# Patient Record
Sex: Female | Born: 1935 | Race: White | Hispanic: No | Smoking: Never smoker
Health system: Southern US, Community
[De-identification: ages and names within clinical notes are randomized; demographics above are authoritative.]

## PROBLEM LIST (undated history)

## (undated) DIAGNOSIS — H269 Unspecified cataract: Secondary | ICD-10-CM

## (undated) DIAGNOSIS — M1990 Unspecified osteoarthritis, unspecified site: Secondary | ICD-10-CM

## (undated) DIAGNOSIS — I10 Essential (primary) hypertension: Secondary | ICD-10-CM

## (undated) DIAGNOSIS — N390 Urinary tract infection, site not specified: Secondary | ICD-10-CM

## (undated) DIAGNOSIS — S42409A Unspecified fracture of lower end of unspecified humerus, initial encounter for closed fracture: Secondary | ICD-10-CM

## (undated) DIAGNOSIS — M810 Age-related osteoporosis without current pathological fracture: Secondary | ICD-10-CM

## (undated) DIAGNOSIS — H9190 Unspecified hearing loss, unspecified ear: Secondary | ICD-10-CM

## (undated) DIAGNOSIS — E039 Hypothyroidism, unspecified: Secondary | ICD-10-CM

## (undated) DIAGNOSIS — K859 Acute pancreatitis without necrosis or infection, unspecified: Secondary | ICD-10-CM

## (undated) DIAGNOSIS — F0390 Unspecified dementia without behavioral disturbance: Secondary | ICD-10-CM

## (undated) DIAGNOSIS — G4700 Insomnia, unspecified: Secondary | ICD-10-CM

## (undated) DIAGNOSIS — H35059 Retinal neovascularization, unspecified, unspecified eye: Secondary | ICD-10-CM

## (undated) DIAGNOSIS — IMO0002 Reserved for concepts with insufficient information to code with codable children: Secondary | ICD-10-CM

## (undated) DIAGNOSIS — H3530 Unspecified macular degeneration: Secondary | ICD-10-CM

## (undated) DIAGNOSIS — S066X9A Traumatic subarachnoid hemorrhage with loss of consciousness of unspecified duration, initial encounter: Secondary | ICD-10-CM

## (undated) HISTORY — DX: Hypothyroidism, unspecified: E03.9

## (undated) HISTORY — DX: Urinary tract infection, site not specified: N39.0

## (undated) HISTORY — DX: Unspecified hearing loss, unspecified ear: H91.90

## (undated) HISTORY — DX: Acute pancreatitis without necrosis or infection, unspecified: K85.90

## (undated) HISTORY — DX: Reserved for concepts with insufficient information to code with codable children: IMO0002

## (undated) HISTORY — DX: Insomnia, unspecified: G47.00

## (undated) HISTORY — DX: Unspecified macular degeneration: H35.30

## (undated) HISTORY — DX: Retinal neovascularization, unspecified, unspecified eye: H35.059

## (undated) HISTORY — DX: Age-related osteoporosis without current pathological fracture: M81.0

## (undated) HISTORY — DX: Unspecified osteoarthritis, unspecified site: M19.90

## (undated) HISTORY — PX: BLADDER SUSPENSION: SHX72

## (undated) HISTORY — DX: Unspecified fracture of lower end of unspecified humerus, initial encounter for closed fracture: S42.409A

---
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
## 1958-05-13 HISTORY — PX: ANKLE FRACTURE SURGERY: SHX122

## 2000-03-23 ENCOUNTER — Emergency Department (HOSPITAL_COMMUNITY): Admission: EM | Admit: 2000-03-23 | Discharge: 2000-03-24 | Payer: Self-pay | Admitting: Emergency Medicine

## 2000-03-28 ENCOUNTER — Emergency Department (HOSPITAL_COMMUNITY): Admission: EM | Admit: 2000-03-28 | Discharge: 2000-03-28 | Payer: Self-pay | Admitting: Emergency Medicine

## 2000-04-04 ENCOUNTER — Other Ambulatory Visit: Admission: RE | Admit: 2000-04-04 | Discharge: 2000-04-04 | Payer: Self-pay | Admitting: Geriatric Medicine

## 2004-04-30 ENCOUNTER — Emergency Department (HOSPITAL_COMMUNITY): Admission: EM | Admit: 2004-04-30 | Discharge: 2004-04-30 | Payer: Self-pay | Admitting: Emergency Medicine

## 2005-07-26 ENCOUNTER — Encounter: Admission: RE | Admit: 2005-07-26 | Discharge: 2005-07-26 | Payer: Self-pay | Admitting: Gastroenterology

## 2009-11-08 ENCOUNTER — Inpatient Hospital Stay (HOSPITAL_COMMUNITY): Admission: AD | Admit: 2009-11-08 | Discharge: 2009-11-10 | Payer: Self-pay | Admitting: Internal Medicine

## 2010-07-29 LAB — PHOSPHORUS
Phosphorus: 2.6 mg/dL (ref 2.3–4.6)
Phosphorus: 2.8 mg/dL (ref 2.3–4.6)

## 2010-07-29 LAB — TSH: TSH: 3.16 u[IU]/mL (ref 0.350–4.500)

## 2010-07-29 LAB — COMPREHENSIVE METABOLIC PANEL
ALT: 20 U/L (ref 0–35)
AST: 25 U/L (ref 0–37)
Albumin: 3.2 g/dL — ABNORMAL LOW (ref 3.5–5.2)
Alkaline Phosphatase: 30 U/L — ABNORMAL LOW (ref 39–117)
BUN: 24 mg/dL — ABNORMAL HIGH (ref 6–23)
CO2: 30 mEq/L (ref 19–32)
Calcium: 11 mg/dL — ABNORMAL HIGH (ref 8.4–10.5)
Chloride: 94 mEq/L — ABNORMAL LOW (ref 96–112)
Creatinine, Ser: 1.09 mg/dL (ref 0.4–1.2)
GFR calc Af Amer: 57 mL/min — ABNORMAL LOW (ref >60)
GFR calc non Af Amer: 47 mL/min — ABNORMAL LOW (ref >60)
Glucose, Bld: 106 mg/dL — ABNORMAL HIGH (ref 70–99)
Potassium: 3.4 mEq/L — ABNORMAL LOW (ref 3.5–5.1)
Sodium: 132 mEq/L — ABNORMAL LOW (ref 135–145)
Total Bilirubin: 0.9 mg/dL (ref 0.3–1.2)
Total Protein: 5.8 g/dL — ABNORMAL LOW (ref 6.0–8.3)

## 2010-07-29 LAB — CBC
HCT: 33.3 % — ABNORMAL LOW (ref 36.0–46.0)
Hemoglobin: 11.8 g/dL — ABNORMAL LOW (ref 12.0–15.0)
MCH: 34.8 pg — ABNORMAL HIGH (ref 26.0–34.0)
MCHC: 35.4 g/dL (ref 30.0–36.0)
MCV: 98.2 fL (ref 78.0–100.0)
Platelets: 185 10*3/uL (ref 150–400)
RBC: 3.39 MIL/uL — ABNORMAL LOW (ref 3.87–5.11)
RDW: 13 % (ref 11.5–15.5)
WBC: 7.5 10*3/uL (ref 4.0–10.5)

## 2010-07-29 LAB — BASIC METABOLIC PANEL
BUN: 20 mg/dL (ref 6–23)
CO2: 23 mEq/L (ref 19–32)
Calcium: 9.4 mg/dL (ref 8.4–10.5)
Chloride: 106 mEq/L (ref 96–112)
Creatinine, Ser: 0.85 mg/dL (ref 0.4–1.2)
GFR calc Af Amer: >60 mL/min (ref >60)
GFR calc non Af Amer: >60 mL/min (ref >60)
Glucose, Bld: 93 mg/dL (ref 70–99)
Potassium: 3.3 mEq/L — ABNORMAL LOW (ref 3.5–5.1)
Sodium: 139 mEq/L (ref 135–145)

## 2010-07-29 LAB — PTH, INTACT AND CALCIUM
Calcium, Total (PTH): 12.2 mg/dL — ABNORMAL HIGH (ref 8.4–10.5)
PTH: 3.5 pg/mL — ABNORMAL LOW (ref 14.0–72.0)

## 2010-07-29 LAB — MAGNESIUM
Magnesium: 2 mg/dL (ref 1.5–2.5)
Magnesium: 1.4 mg/dL — ABNORMAL LOW (ref 1.5–2.5)

## 2010-07-29 LAB — T3, FREE: T3, Free: 2.2 pg/mL — ABNORMAL LOW (ref 2.3–4.2)

## 2010-07-29 LAB — T4, FREE: Free T4: 1.04 ng/dL (ref 0.80–1.80)

## 2011-05-17 DIAGNOSIS — H353290 Exudative age-related macular degeneration, unspecified eye, stage unspecified: Secondary | ICD-10-CM | POA: Diagnosis not present

## 2011-05-17 DIAGNOSIS — H35059 Retinal neovascularization, unspecified, unspecified eye: Secondary | ICD-10-CM | POA: Diagnosis not present

## 2011-06-03 DIAGNOSIS — H26499 Other secondary cataract, unspecified eye: Secondary | ICD-10-CM | POA: Diagnosis not present

## 2011-06-17 DIAGNOSIS — H543 Unqualified visual loss, both eyes: Secondary | ICD-10-CM | POA: Diagnosis not present

## 2011-06-17 DIAGNOSIS — H3530 Unspecified macular degeneration: Secondary | ICD-10-CM | POA: Diagnosis not present

## 2011-06-17 DIAGNOSIS — Z5189 Encounter for other specified aftercare: Secondary | ICD-10-CM | POA: Diagnosis not present

## 2011-06-17 DIAGNOSIS — Z9181 History of falling: Secondary | ICD-10-CM | POA: Diagnosis not present

## 2011-06-17 DIAGNOSIS — R262 Difficulty in walking, not elsewhere classified: Secondary | ICD-10-CM | POA: Diagnosis not present

## 2011-06-17 DIAGNOSIS — M159 Polyosteoarthritis, unspecified: Secondary | ICD-10-CM | POA: Diagnosis not present

## 2011-06-17 DIAGNOSIS — R293 Abnormal posture: Secondary | ICD-10-CM | POA: Diagnosis not present

## 2011-06-18 DIAGNOSIS — H353290 Exudative age-related macular degeneration, unspecified eye, stage unspecified: Secondary | ICD-10-CM | POA: Diagnosis not present

## 2011-06-18 DIAGNOSIS — H35059 Retinal neovascularization, unspecified, unspecified eye: Secondary | ICD-10-CM | POA: Diagnosis not present

## 2011-06-20 DIAGNOSIS — M159 Polyosteoarthritis, unspecified: Secondary | ICD-10-CM | POA: Diagnosis not present

## 2011-06-20 DIAGNOSIS — R262 Difficulty in walking, not elsewhere classified: Secondary | ICD-10-CM | POA: Diagnosis not present

## 2011-06-20 DIAGNOSIS — H3530 Unspecified macular degeneration: Secondary | ICD-10-CM | POA: Diagnosis not present

## 2011-06-20 DIAGNOSIS — Z5189 Encounter for other specified aftercare: Secondary | ICD-10-CM | POA: Diagnosis not present

## 2011-06-20 DIAGNOSIS — H543 Unqualified visual loss, both eyes: Secondary | ICD-10-CM | POA: Diagnosis not present

## 2011-06-20 DIAGNOSIS — R293 Abnormal posture: Secondary | ICD-10-CM | POA: Diagnosis not present

## 2011-06-25 DIAGNOSIS — H543 Unqualified visual loss, both eyes: Secondary | ICD-10-CM | POA: Diagnosis not present

## 2011-06-25 DIAGNOSIS — R262 Difficulty in walking, not elsewhere classified: Secondary | ICD-10-CM | POA: Diagnosis not present

## 2011-06-25 DIAGNOSIS — Z5189 Encounter for other specified aftercare: Secondary | ICD-10-CM | POA: Diagnosis not present

## 2011-06-25 DIAGNOSIS — R293 Abnormal posture: Secondary | ICD-10-CM | POA: Diagnosis not present

## 2011-06-25 DIAGNOSIS — M159 Polyosteoarthritis, unspecified: Secondary | ICD-10-CM | POA: Diagnosis not present

## 2011-06-25 DIAGNOSIS — H3530 Unspecified macular degeneration: Secondary | ICD-10-CM | POA: Diagnosis not present

## 2011-06-27 DIAGNOSIS — R293 Abnormal posture: Secondary | ICD-10-CM | POA: Diagnosis not present

## 2011-06-27 DIAGNOSIS — M159 Polyosteoarthritis, unspecified: Secondary | ICD-10-CM | POA: Diagnosis not present

## 2011-06-27 DIAGNOSIS — R262 Difficulty in walking, not elsewhere classified: Secondary | ICD-10-CM | POA: Diagnosis not present

## 2011-06-27 DIAGNOSIS — H543 Unqualified visual loss, both eyes: Secondary | ICD-10-CM | POA: Diagnosis not present

## 2011-06-27 DIAGNOSIS — Z5189 Encounter for other specified aftercare: Secondary | ICD-10-CM | POA: Diagnosis not present

## 2011-06-27 DIAGNOSIS — H3530 Unspecified macular degeneration: Secondary | ICD-10-CM | POA: Diagnosis not present

## 2011-07-02 DIAGNOSIS — H3530 Unspecified macular degeneration: Secondary | ICD-10-CM | POA: Diagnosis not present

## 2011-07-02 DIAGNOSIS — H543 Unqualified visual loss, both eyes: Secondary | ICD-10-CM | POA: Diagnosis not present

## 2011-07-02 DIAGNOSIS — R262 Difficulty in walking, not elsewhere classified: Secondary | ICD-10-CM | POA: Diagnosis not present

## 2011-07-02 DIAGNOSIS — R293 Abnormal posture: Secondary | ICD-10-CM | POA: Diagnosis not present

## 2011-07-02 DIAGNOSIS — Z5189 Encounter for other specified aftercare: Secondary | ICD-10-CM | POA: Diagnosis not present

## 2011-07-02 DIAGNOSIS — M159 Polyosteoarthritis, unspecified: Secondary | ICD-10-CM | POA: Diagnosis not present

## 2011-07-04 DIAGNOSIS — H3530 Unspecified macular degeneration: Secondary | ICD-10-CM | POA: Diagnosis not present

## 2011-07-04 DIAGNOSIS — M159 Polyosteoarthritis, unspecified: Secondary | ICD-10-CM | POA: Diagnosis not present

## 2011-07-04 DIAGNOSIS — R262 Difficulty in walking, not elsewhere classified: Secondary | ICD-10-CM | POA: Diagnosis not present

## 2011-07-04 DIAGNOSIS — Z5189 Encounter for other specified aftercare: Secondary | ICD-10-CM | POA: Diagnosis not present

## 2011-07-04 DIAGNOSIS — H543 Unqualified visual loss, both eyes: Secondary | ICD-10-CM | POA: Diagnosis not present

## 2011-07-04 DIAGNOSIS — R293 Abnormal posture: Secondary | ICD-10-CM | POA: Diagnosis not present

## 2011-07-08 DIAGNOSIS — M159 Polyosteoarthritis, unspecified: Secondary | ICD-10-CM | POA: Diagnosis not present

## 2011-07-08 DIAGNOSIS — R293 Abnormal posture: Secondary | ICD-10-CM | POA: Diagnosis not present

## 2011-07-08 DIAGNOSIS — R262 Difficulty in walking, not elsewhere classified: Secondary | ICD-10-CM | POA: Diagnosis not present

## 2011-07-08 DIAGNOSIS — H3530 Unspecified macular degeneration: Secondary | ICD-10-CM | POA: Diagnosis not present

## 2011-07-08 DIAGNOSIS — H543 Unqualified visual loss, both eyes: Secondary | ICD-10-CM | POA: Diagnosis not present

## 2011-07-08 DIAGNOSIS — Z5189 Encounter for other specified aftercare: Secondary | ICD-10-CM | POA: Diagnosis not present

## 2011-07-10 DIAGNOSIS — Z5189 Encounter for other specified aftercare: Secondary | ICD-10-CM | POA: Diagnosis not present

## 2011-07-10 DIAGNOSIS — H543 Unqualified visual loss, both eyes: Secondary | ICD-10-CM | POA: Diagnosis not present

## 2011-07-10 DIAGNOSIS — H3530 Unspecified macular degeneration: Secondary | ICD-10-CM | POA: Diagnosis not present

## 2011-07-10 DIAGNOSIS — R262 Difficulty in walking, not elsewhere classified: Secondary | ICD-10-CM | POA: Diagnosis not present

## 2011-07-10 DIAGNOSIS — R293 Abnormal posture: Secondary | ICD-10-CM | POA: Diagnosis not present

## 2011-07-10 DIAGNOSIS — M159 Polyosteoarthritis, unspecified: Secondary | ICD-10-CM | POA: Diagnosis not present

## 2011-07-12 DIAGNOSIS — H3530 Unspecified macular degeneration: Secondary | ICD-10-CM | POA: Diagnosis not present

## 2011-07-12 DIAGNOSIS — R262 Difficulty in walking, not elsewhere classified: Secondary | ICD-10-CM | POA: Diagnosis not present

## 2011-07-12 DIAGNOSIS — Z5189 Encounter for other specified aftercare: Secondary | ICD-10-CM | POA: Diagnosis not present

## 2011-07-12 DIAGNOSIS — M159 Polyosteoarthritis, unspecified: Secondary | ICD-10-CM | POA: Diagnosis not present

## 2011-07-12 DIAGNOSIS — R293 Abnormal posture: Secondary | ICD-10-CM | POA: Diagnosis not present

## 2011-07-12 DIAGNOSIS — H543 Unqualified visual loss, both eyes: Secondary | ICD-10-CM | POA: Diagnosis not present

## 2011-07-15 DIAGNOSIS — H543 Unqualified visual loss, both eyes: Secondary | ICD-10-CM | POA: Diagnosis not present

## 2011-07-15 DIAGNOSIS — R293 Abnormal posture: Secondary | ICD-10-CM | POA: Diagnosis not present

## 2011-07-15 DIAGNOSIS — H3530 Unspecified macular degeneration: Secondary | ICD-10-CM | POA: Diagnosis not present

## 2011-07-15 DIAGNOSIS — R262 Difficulty in walking, not elsewhere classified: Secondary | ICD-10-CM | POA: Diagnosis not present

## 2011-07-15 DIAGNOSIS — M159 Polyosteoarthritis, unspecified: Secondary | ICD-10-CM | POA: Diagnosis not present

## 2011-07-15 DIAGNOSIS — Z5189 Encounter for other specified aftercare: Secondary | ICD-10-CM | POA: Diagnosis not present

## 2011-07-17 DIAGNOSIS — Z5189 Encounter for other specified aftercare: Secondary | ICD-10-CM | POA: Diagnosis not present

## 2011-07-17 DIAGNOSIS — M159 Polyosteoarthritis, unspecified: Secondary | ICD-10-CM | POA: Diagnosis not present

## 2011-07-17 DIAGNOSIS — H543 Unqualified visual loss, both eyes: Secondary | ICD-10-CM | POA: Diagnosis not present

## 2011-07-17 DIAGNOSIS — R293 Abnormal posture: Secondary | ICD-10-CM | POA: Diagnosis not present

## 2011-07-17 DIAGNOSIS — R262 Difficulty in walking, not elsewhere classified: Secondary | ICD-10-CM | POA: Diagnosis not present

## 2011-07-17 DIAGNOSIS — H3530 Unspecified macular degeneration: Secondary | ICD-10-CM | POA: Diagnosis not present

## 2011-07-22 DIAGNOSIS — M159 Polyosteoarthritis, unspecified: Secondary | ICD-10-CM | POA: Diagnosis not present

## 2011-07-22 DIAGNOSIS — H3530 Unspecified macular degeneration: Secondary | ICD-10-CM | POA: Diagnosis not present

## 2011-07-22 DIAGNOSIS — Z5189 Encounter for other specified aftercare: Secondary | ICD-10-CM | POA: Diagnosis not present

## 2011-07-22 DIAGNOSIS — R293 Abnormal posture: Secondary | ICD-10-CM | POA: Diagnosis not present

## 2011-07-22 DIAGNOSIS — R262 Difficulty in walking, not elsewhere classified: Secondary | ICD-10-CM | POA: Diagnosis not present

## 2011-07-22 DIAGNOSIS — H543 Unqualified visual loss, both eyes: Secondary | ICD-10-CM | POA: Diagnosis not present

## 2011-07-24 DIAGNOSIS — H543 Unqualified visual loss, both eyes: Secondary | ICD-10-CM | POA: Diagnosis not present

## 2011-07-24 DIAGNOSIS — R262 Difficulty in walking, not elsewhere classified: Secondary | ICD-10-CM | POA: Diagnosis not present

## 2011-07-24 DIAGNOSIS — M159 Polyosteoarthritis, unspecified: Secondary | ICD-10-CM | POA: Diagnosis not present

## 2011-07-24 DIAGNOSIS — R293 Abnormal posture: Secondary | ICD-10-CM | POA: Diagnosis not present

## 2011-07-24 DIAGNOSIS — Z5189 Encounter for other specified aftercare: Secondary | ICD-10-CM | POA: Diagnosis not present

## 2011-07-24 DIAGNOSIS — H3530 Unspecified macular degeneration: Secondary | ICD-10-CM | POA: Diagnosis not present

## 2011-07-29 DIAGNOSIS — R262 Difficulty in walking, not elsewhere classified: Secondary | ICD-10-CM | POA: Diagnosis not present

## 2011-07-29 DIAGNOSIS — M159 Polyosteoarthritis, unspecified: Secondary | ICD-10-CM | POA: Diagnosis not present

## 2011-07-29 DIAGNOSIS — R293 Abnormal posture: Secondary | ICD-10-CM | POA: Diagnosis not present

## 2011-07-29 DIAGNOSIS — H543 Unqualified visual loss, both eyes: Secondary | ICD-10-CM | POA: Diagnosis not present

## 2011-07-29 DIAGNOSIS — H3530 Unspecified macular degeneration: Secondary | ICD-10-CM | POA: Diagnosis not present

## 2011-07-29 DIAGNOSIS — Z5189 Encounter for other specified aftercare: Secondary | ICD-10-CM | POA: Diagnosis not present

## 2011-07-30 DIAGNOSIS — Z5189 Encounter for other specified aftercare: Secondary | ICD-10-CM | POA: Diagnosis not present

## 2011-07-30 DIAGNOSIS — R293 Abnormal posture: Secondary | ICD-10-CM | POA: Diagnosis not present

## 2011-07-30 DIAGNOSIS — H3530 Unspecified macular degeneration: Secondary | ICD-10-CM | POA: Diagnosis not present

## 2011-07-30 DIAGNOSIS — R262 Difficulty in walking, not elsewhere classified: Secondary | ICD-10-CM | POA: Diagnosis not present

## 2011-07-30 DIAGNOSIS — H543 Unqualified visual loss, both eyes: Secondary | ICD-10-CM | POA: Diagnosis not present

## 2011-07-30 DIAGNOSIS — M159 Polyosteoarthritis, unspecified: Secondary | ICD-10-CM | POA: Diagnosis not present

## 2011-08-19 DIAGNOSIS — B351 Tinea unguium: Secondary | ICD-10-CM | POA: Diagnosis not present

## 2011-09-11 DIAGNOSIS — L570 Actinic keratosis: Secondary | ICD-10-CM | POA: Diagnosis not present

## 2011-09-17 DIAGNOSIS — H353290 Exudative age-related macular degeneration, unspecified eye, stage unspecified: Secondary | ICD-10-CM | POA: Diagnosis not present

## 2011-10-17 DIAGNOSIS — W19XXXA Unspecified fall, initial encounter: Secondary | ICD-10-CM | POA: Diagnosis not present

## 2011-10-17 DIAGNOSIS — I10 Essential (primary) hypertension: Secondary | ICD-10-CM | POA: Diagnosis not present

## 2011-10-17 DIAGNOSIS — Z79899 Other long term (current) drug therapy: Secondary | ICD-10-CM | POA: Diagnosis not present

## 2011-11-18 DIAGNOSIS — B351 Tinea unguium: Secondary | ICD-10-CM | POA: Diagnosis not present

## 2011-11-19 DIAGNOSIS — H35059 Retinal neovascularization, unspecified, unspecified eye: Secondary | ICD-10-CM | POA: Diagnosis not present

## 2011-11-19 DIAGNOSIS — H353290 Exudative age-related macular degeneration, unspecified eye, stage unspecified: Secondary | ICD-10-CM | POA: Diagnosis not present

## 2011-12-13 DIAGNOSIS — H908 Mixed conductive and sensorineural hearing loss, unspecified: Secondary | ICD-10-CM | POA: Diagnosis not present

## 2011-12-13 DIAGNOSIS — H905 Unspecified sensorineural hearing loss: Secondary | ICD-10-CM | POA: Diagnosis not present

## 2012-01-17 DIAGNOSIS — IMO0002 Reserved for concepts with insufficient information to code with codable children: Secondary | ICD-10-CM | POA: Diagnosis not present

## 2012-01-17 DIAGNOSIS — M1710 Unilateral primary osteoarthritis, unspecified knee: Secondary | ICD-10-CM | POA: Diagnosis not present

## 2012-01-17 DIAGNOSIS — N390 Urinary tract infection, site not specified: Secondary | ICD-10-CM | POA: Diagnosis not present

## 2012-01-17 DIAGNOSIS — I10 Essential (primary) hypertension: Secondary | ICD-10-CM | POA: Diagnosis not present

## 2012-01-21 DIAGNOSIS — H353290 Exudative age-related macular degeneration, unspecified eye, stage unspecified: Secondary | ICD-10-CM | POA: Diagnosis not present

## 2012-01-31 DIAGNOSIS — N390 Urinary tract infection, site not specified: Secondary | ICD-10-CM | POA: Diagnosis not present

## 2012-02-10 DIAGNOSIS — M79609 Pain in unspecified limb: Secondary | ICD-10-CM | POA: Diagnosis not present

## 2012-02-10 DIAGNOSIS — B351 Tinea unguium: Secondary | ICD-10-CM | POA: Diagnosis not present

## 2012-02-12 DIAGNOSIS — L570 Actinic keratosis: Secondary | ICD-10-CM | POA: Diagnosis not present

## 2012-02-12 DIAGNOSIS — L219 Seborrheic dermatitis, unspecified: Secondary | ICD-10-CM | POA: Diagnosis not present

## 2012-02-13 DIAGNOSIS — Z1231 Encounter for screening mammogram for malignant neoplasm of breast: Secondary | ICD-10-CM | POA: Diagnosis not present

## 2012-02-13 DIAGNOSIS — M949 Disorder of cartilage, unspecified: Secondary | ICD-10-CM | POA: Diagnosis not present

## 2012-02-13 DIAGNOSIS — M899 Disorder of bone, unspecified: Secondary | ICD-10-CM | POA: Diagnosis not present

## 2012-02-14 DIAGNOSIS — N390 Urinary tract infection, site not specified: Secondary | ICD-10-CM | POA: Diagnosis not present

## 2012-02-18 DIAGNOSIS — H3530 Unspecified macular degeneration: Secondary | ICD-10-CM | POA: Diagnosis not present

## 2012-02-18 DIAGNOSIS — H35059 Retinal neovascularization, unspecified, unspecified eye: Secondary | ICD-10-CM | POA: Diagnosis not present

## 2012-02-18 DIAGNOSIS — H353290 Exudative age-related macular degeneration, unspecified eye, stage unspecified: Secondary | ICD-10-CM | POA: Diagnosis not present

## 2012-03-09 DIAGNOSIS — Z111 Encounter for screening for respiratory tuberculosis: Secondary | ICD-10-CM | POA: Diagnosis not present

## 2012-03-18 DIAGNOSIS — Z111 Encounter for screening for respiratory tuberculosis: Secondary | ICD-10-CM | POA: Diagnosis not present

## 2012-03-30 ENCOUNTER — Inpatient Hospital Stay (HOSPITAL_COMMUNITY)
Admission: EM | Admit: 2012-03-30 | Discharge: 2012-04-02 | DRG: 492 | Disposition: A | Discharge status: Nursing Home (Includes ICF) | Payer: Medicare Other | Attending: Internal Medicine | Admitting: Internal Medicine

## 2012-03-30 ENCOUNTER — Emergency Department (HOSPITAL_COMMUNITY): Payer: Medicare Other

## 2012-03-30 ENCOUNTER — Encounter (HOSPITAL_COMMUNITY): Payer: Self-pay | Admitting: *Deleted

## 2012-03-30 DIAGNOSIS — S0990XA Unspecified injury of head, initial encounter: Secondary | ICD-10-CM

## 2012-03-30 DIAGNOSIS — S199XXA Unspecified injury of neck, initial encounter: Secondary | ICD-10-CM | POA: Diagnosis not present

## 2012-03-30 DIAGNOSIS — R229 Localized swelling, mass and lump, unspecified: Secondary | ICD-10-CM | POA: Diagnosis not present

## 2012-03-30 DIAGNOSIS — Z66 Do not resuscitate: Secondary | ICD-10-CM | POA: Diagnosis present

## 2012-03-30 DIAGNOSIS — T148XXA Other injury of unspecified body region, initial encounter: Secondary | ICD-10-CM | POA: Diagnosis not present

## 2012-03-30 DIAGNOSIS — S298XXA Other specified injuries of thorax, initial encounter: Secondary | ICD-10-CM | POA: Diagnosis not present

## 2012-03-30 DIAGNOSIS — S066X0A Traumatic subarachnoid hemorrhage without loss of consciousness, initial encounter: Secondary | ICD-10-CM | POA: Diagnosis present

## 2012-03-30 DIAGNOSIS — H9190 Unspecified hearing loss, unspecified ear: Secondary | ICD-10-CM

## 2012-03-30 DIAGNOSIS — S42409A Unspecified fracture of lower end of unspecified humerus, initial encounter for closed fracture: Secondary | ICD-10-CM

## 2012-03-30 DIAGNOSIS — E876 Hypokalemia: Secondary | ICD-10-CM | POA: Diagnosis present

## 2012-03-30 DIAGNOSIS — W010XXA Fall on same level from slipping, tripping and stumbling without subsequent striking against object, initial encounter: Secondary | ICD-10-CM | POA: Diagnosis present

## 2012-03-30 DIAGNOSIS — Z79899 Other long term (current) drug therapy: Secondary | ICD-10-CM

## 2012-03-30 DIAGNOSIS — S066X9A Traumatic subarachnoid hemorrhage with loss of consciousness of unspecified duration, initial encounter: Secondary | ICD-10-CM

## 2012-03-30 DIAGNOSIS — M79609 Pain in unspecified limb: Secondary | ICD-10-CM | POA: Diagnosis not present

## 2012-03-30 DIAGNOSIS — I10 Essential (primary) hypertension: Secondary | ICD-10-CM

## 2012-03-30 DIAGNOSIS — M79676 Pain in unspecified toe(s): Secondary | ICD-10-CM

## 2012-03-30 DIAGNOSIS — S6990XA Unspecified injury of unspecified wrist, hand and finger(s), initial encounter: Secondary | ICD-10-CM | POA: Diagnosis not present

## 2012-03-30 DIAGNOSIS — Z9181 History of falling: Secondary | ICD-10-CM | POA: Diagnosis present

## 2012-03-30 DIAGNOSIS — S42413A Displaced simple supracondylar fracture without intercondylar fracture of unspecified humerus, initial encounter for closed fracture: Principal | ICD-10-CM | POA: Diagnosis present

## 2012-03-30 DIAGNOSIS — S066XAA Traumatic subarachnoid hemorrhage with loss of consciousness status unknown, initial encounter: Secondary | ICD-10-CM | POA: Diagnosis not present

## 2012-03-30 DIAGNOSIS — S0993XA Unspecified injury of face, initial encounter: Secondary | ICD-10-CM | POA: Diagnosis not present

## 2012-03-30 DIAGNOSIS — S72453A Displaced supracondylar fracture without intracondylar extension of lower end of unspecified femur, initial encounter for closed fracture: Secondary | ICD-10-CM

## 2012-03-30 DIAGNOSIS — M542 Cervicalgia: Secondary | ICD-10-CM | POA: Diagnosis not present

## 2012-03-30 DIAGNOSIS — W19XXXA Unspecified fall, initial encounter: Secondary | ICD-10-CM

## 2012-03-30 DIAGNOSIS — M7989 Other specified soft tissue disorders: Secondary | ICD-10-CM | POA: Diagnosis not present

## 2012-03-30 HISTORY — DX: Unspecified osteoarthritis, unspecified site: M19.90

## 2012-03-30 HISTORY — DX: Unspecified cataract: H26.9

## 2012-03-30 HISTORY — DX: Essential (primary) hypertension: I10

## 2012-03-30 LAB — CBC WITH DIFFERENTIAL/PLATELET
Eosinophils Absolute: 0.3 10*3/uL (ref 0.0–0.7)
Eosinophils Relative: 3 % (ref 0–5)
HCT: 36.6 % (ref 36.0–46.0)
Lymphocytes Relative: 19 % (ref 12–46)
Lymphs Abs: 1.9 10*3/uL (ref 0.7–4.0)
MCH: 32.9 pg (ref 26.0–34.0)
MCV: 95.6 fL (ref 78.0–100.0)
Monocytes Absolute: 0.9 10*3/uL (ref 0.1–1.0)
Platelets: 147 10*3/uL — ABNORMAL LOW (ref 150–400)
RBC: 3.83 MIL/uL — ABNORMAL LOW (ref 3.87–5.11)
WBC: 9.6 10*3/uL (ref 4.0–10.5)

## 2012-03-30 LAB — BASIC METABOLIC PANEL
BUN: 23 mg/dL (ref 6–23)
CO2: 24 mEq/L (ref 19–32)
Calcium: 9 mg/dL (ref 8.4–10.5)
Creatinine, Ser: 0.6 mg/dL (ref 0.50–1.10)
GFR calc non Af Amer: 76 mL/min — ABNORMAL LOW (ref >90)
Glucose, Bld: 178 mg/dL — ABNORMAL HIGH (ref 70–99)
Sodium: 137 mEq/L (ref 135–145)

## 2012-03-30 LAB — PROTIME-INR: INR: 1.01 (ref 0.00–1.49)

## 2012-03-30 MED ORDER — SODIUM CHLORIDE 0.9 % IV SOLN
INTRAVENOUS | Status: DC
  Administered 2012-03-30: 23:00:00 via INTRAVENOUS

## 2012-03-30 MED ORDER — ONDANSETRON HCL 4 MG/2ML IJ SOLN
4.0000 mg | Freq: Once | INTRAMUSCULAR | Status: AC
  Administered 2012-03-30: 4 mg via INTRAVENOUS
  Filled 2012-03-30: qty 2

## 2012-03-30 MED ORDER — FENTANYL CITRATE 0.05 MG/ML IJ SOLN
50.0000 ug | Freq: Once | INTRAMUSCULAR | Status: AC
  Administered 2012-03-30: 50 ug via INTRAVENOUS
  Filled 2012-03-30: qty 2

## 2012-03-30 NOTE — ED Notes (Signed)
Daughter to bedside; rings given to daughter.

## 2012-03-30 NOTE — ED Provider Notes (Signed)
History     CSN: [id number]  Arrival date & time 03/30/12  2026   First MD Initiated Contact with Patient 03/30/12 2136      Chief Complaint  Patient presents with  . Fall  . Arm Injury    HPI Pt to room via [facility]; pt with fall this pm c/o left elbow and left hand pain; denies head, neck or back pain at present; Pt is from [facility] and facility reports this is the 2nd fall today; staff reported to EMS that pt fell earlier today and struck rt side of head; no LOC; Bruising and abrasions noted to rt forehead / eye area; pt c/o pain to left arm and hand; pt's left ring finger is swollen and purple colored; 3 rings noted to left ring finger; rings are cutting into swollen finger; EMS unable to remove with lubrication; Ortho tech called upon pt's arrival to remove rings  Past Medical History  Diagnosis Date  . Arthritis   . Osteoarthritis   . Hypertension   . Cataracts, bilateral     Past Surgical History  Procedure Date  . Ankle fracture surgery   . Abdominal hysterectomy     No family history on file.  History  Substance Use Topics  . Smoking status: Not on file  . Smokeless tobacco: Not on file  . Alcohol Use: No    OB History    Grav Para Term Preterm Abortions TAB SAB Ect Mult Living                  Review of Systems All other systems reviewed and are negative Allergies  Review of patient's allergies indicates no known allergies.  Home Medications   Current Outpatient Rx  Name  Route  Sig  Dispense  Refill  . ACETAMINOPHEN 500 MG PO TABS   Oral   Take 500 mg by mouth every 6 (six) hours as needed. Pain         . AMLODIPINE BESYLATE 10 MG PO TABS   Oral   Take 10 mg by mouth daily.         . ASPIRIN EC 81 MG PO TBEC   Oral   Take 81 mg by mouth daily.         . OCUVITE PO TABS   Oral   Take 1 tablet by mouth 2 (two) times daily.          Marland Kitchen LOSARTAN POTASSIUM 100 MG PO TABS   Oral   Take 100 mg by mouth daily.         Marland Kitchen METOPROLOL TARTRATE 50 MG PO TABS   Oral   Take 50 mg by mouth 2 (two) times daily.         Marland Kitchen RALOXIFENE HCL 60 MG PO TABS   Oral   Take 60 mg by mouth daily.         Marland Kitchen ZOLPIDEM TARTRATE 5 MG PO TABS   Oral   Take 2.5 mg by mouth at bedtime as needed. sleep           BP 117/65  Pulse 58  Temp 98.4 F (36.9 C) (Oral)  Resp 14  SpO2 95%  Physical Exam  Nursing note and vitals reviewed. Constitutional: She is oriented to person, place, and time. She appears well-developed and well-nourished. No distress.  HENT:  Head: Normocephalic.    Eyes: Pupils are equal, round, and reactive to light.  Neck: Normal range of motion.  Cardiovascular: Normal rate and intact distal pulses.   Pulmonary/Chest: No respiratory distress.  Abdominal: Normal appearance. She exhibits no distension.  Musculoskeletal:       Left elbow: She exhibits decreased range of motion and deformity.  Neurological: She is alert and oriented to person, place, and time. No cranial nerve deficit.  Skin: Skin is warm and dry. No rash noted.  Psychiatric: She has a normal mood and affect. Her behavior is normal.    ED Course  Procedures (including critical care time)  Labs Reviewed  CBC WITH DIFFERENTIAL - Abnormal; Notable for the following:    RBC 3.83 (*)     Platelets 147 (*)     All other components within normal limits  BASIC METABOLIC PANEL - Abnormal; Notable for the following:    Potassium 3.1 (*)     Glucose, Bld 178 (*)     GFR calc non Af Amer 76 (*)     GFR calc Af Amer 88 (*)     All other components within normal limits  PROTIME-INR   Dg Elbow 2 Views Left  03/30/2012  *RADIOLOGY REPORT*  Clinical Data: Larey Seat.  Left elbow pain.  LEFT ELBOW - 2 VIEW  Comparison: None  Findings: There is a comminuted supracondylar elbow fracture.  The radius and ulna appear intact.  IMPRESSION: Complex displaced supracondylar fracture of the distal humerus.   Original Report Authenticated By: Rudie Meyer, M.D.    Ct Head Wo Contrast  03/30/2012  *RADIOLOGY REPORT*  Clinical Data:  Head and neck pain post fall  CT HEAD WITHOUT CONTRAST CT CERVICAL SPINE WITHOUT CONTRAST  Technique:  Multidetector CT imaging of the head and cervical spine was performed following the standard protocol without intravenous contrast.  Multiplanar CT image reconstructions of the cervical spine were also generated.  Comparison:  None  CT HEAD  Findings: Generalized atrophy. Normal ventricular morphology. No midline shift or mass effect. Small vessel chronic ischemic changes of deep cerebral white matter. At the right vertex, a small amount of high attenuation acute subarachnoid blood is identified. No additional intracranial hemorrhage, mass lesion, or acute infarction. 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 Visualized paranasal sinuses and mastoid air cells clear. Small sclerotic focus identified at the right frontal bone with a contiguous question exostosis arising from the inner table of the calvarium, unchanged from 2011 radiographs.  IMPRESSION: Atrophy with small vessel chronic ischemic changes of deep cerebral white matter. Small amount of high attenuation subarachnoid hemorrhage is seen at the right vertex. 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 No additional acute intracranial abnormalities.  CT CERVICAL SPINE  Findings: Scattered atherosclerotic calcifications within the carotid systems. Biapical lung scarring. Visualized skull base intact. Disc space narrowing with endplate spur formation at C4-C5, C5-C6, C6-C7. Vertebral body heights maintained without fracture or subluxation. Minimal scattered facet degenerative changes. Prevertebral soft tissues normal thickness. Osseous demineralization.  IMPRESSION:  Degenerative disc disease changes cervical spine at C4-C5 through C6-C7. No acute cervical spine abnormalities identified.  Critical Value/emergent results were called by telephone at the time of  interpretation on 03/30/2012 at 2014 hours to Dr. Radford Pax, who verbally acknowledged these results.   Original Report Authenticated By: Ulyses Southward, M.D.    Ct Cervical Spine Wo Contrast  03/30/2012  *RADIOLOGY REPORT*  Clinical Data:  Head and neck pain post fall  CT HEAD WITHOUT CONTRAST CT CERVICAL SPINE WITHOUT CONTRAST  Technique:  Multidetector CT imaging of the head and cervical spine was performed following the standard protocol  without intravenous contrast.  Multiplanar CT image reconstructions of the cervical spine were also generated.  Comparison:  None  CT HEAD  Findings: Generalized atrophy. Normal ventricular morphology. No midline shift or mass effect. Small vessel chronic ischemic changes of deep cerebral white matter. At the right vertex, a small amount of high attenuation acute subarachnoid blood is identified. No additional intracranial hemorrhage, mass lesion, or acute infarction. 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 Visualized paranasal sinuses and mastoid air cells clear. Small sclerotic focus identified at the right frontal bone with a contiguous question exostosis arising from the inner table of the calvarium, unchanged from 2011 radiographs.  IMPRESSION: Atrophy with small vessel chronic ischemic changes of deep cerebral white matter. Small amount of high attenuation subarachnoid hemorrhage is seen at the right vertex. 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 No additional acute intracranial abnormalities.  CT CERVICAL SPINE  Findings: Scattered atherosclerotic calcifications within the carotid systems. Biapical lung scarring. Visualized skull base intact. Disc space narrowing with endplate spur formation at C4-C5, C5-C6, C6-C7. Vertebral body heights maintained without fracture or subluxation. Minimal scattered facet degenerative changes. Prevertebral soft tissues normal thickness. Osseous demineralization.  IMPRESSION:  Degenerative disc disease changes cervical spine at C4-C5  through C6-C7. No acute cervical spine abnormalities identified.  Critical Value/emergent results were called by telephone at the time of interpretation on 03/30/2012 at 2014 hours to Dr. Radford Pax, who verbally acknowledged these results.   Original Report Authenticated By: Ulyses Southward, M.D.    Dg Chest Portable 1 View  03/30/2012  *RADIOLOGY REPORT*  Clinical Data: Elbow fracture, fall  PORTABLE CHEST - 1 VIEW  Comparison: Portable exam 2230 hours without priors for Romeo Apple  Findings: Upper normal heart size. Calcified tortuous aorta. Pulmonary vascularity normal. Slight rotation to the left. Lungs appear emphysematous with biapical scarring. No acute infiltrate, pleural effusion, or pneumothorax. Probable bilateral chronic rotator cuff tears. Osseous demineralization.  IMPRESSION: Emphysematous changes with biapical scarring. No acute abnormalities.   Original Report Authenticated By: Ulyses Southward, M.D.    Dg Hand Complete Left  03/30/2012  *RADIOLOGY REPORT*  Clinical Data: Larey Seat.  Injured ring finger.  LEFT HAND - COMPLETE 3+ VIEW  Comparison: None  Findings: There are moderate osteoarthritic type degenerative changes involving the DIP and PIP joints of the fingers.  Moderate osteoporosis is also noted.  There is soft tissue swelling involving the ring finger but no definite acute fracture.  Suspect a remote healed fifth metacarpal fracture.  IMPRESSION:  1.  Osteoporosis and osteoarthritic type degenerative changes. 2.  No acute fracture.   Original Report Authenticated By: Rudie Meyer, M.D.      1. Supracondylar fracture of femur   2. Head injury       MDM  Case discussed with Dr. Mina Marble and Dr. Lovell Sheehan.  Will admit to hospitalist and Dr. Mina Marble will see in AM.       Nelia Shi, MD 03/31/12 [phone]

## 2012-03-30 NOTE — ED Notes (Signed)
Pt to room via [facility]; pt with fall this pm c/o left elbow and left hand pain; denies head, neck or back pain at present; Pt is from [facility] and facility reports this is the 2nd fall today; staff reported to EMS that pt fell earlier today and struck rt side of head; no LOC;  Bruising and abrasions noted to rt forehead / eye area; pt c/o pain to left arm and hand; pt's left ring finger is swollen and purple colored; 3 rings noted to left ring finger; rings are cutting into swollen finger; EMS unable to remove with lubrication; Ortho tech called upon pt's arrival to remove rings.

## 2012-03-30 NOTE — Progress Notes (Signed)
CSW met with pt and pt daughter at bedside. Pt confirmed she is a resident at [facility]. Per discussion pt has had two falls today. CSW awaiting MD evaluation to determine pt disposition needs.   Catha Gosselin, LCSWA  [phone] .03/30/2012 22:38pm

## 2012-03-30 NOTE — ED Notes (Signed)
WUJ:[id number]<JY> Expected date:03/30/12<BR> Expected time: 8:11 PM<BR> Means of arrival:Ambulance<BR> Comments:<BR> Fall; elbow pain; finger injury

## 2012-03-31 ENCOUNTER — Encounter (HOSPITAL_COMMUNITY): Payer: Self-pay | Admitting: Family Medicine

## 2012-03-31 ENCOUNTER — Inpatient Hospital Stay (HOSPITAL_COMMUNITY): Payer: Medicare Other

## 2012-03-31 DIAGNOSIS — H9190 Unspecified hearing loss, unspecified ear: Secondary | ICD-10-CM | POA: Diagnosis present

## 2012-03-31 DIAGNOSIS — Z9181 History of falling: Secondary | ICD-10-CM | POA: Diagnosis not present

## 2012-03-31 DIAGNOSIS — M79609 Pain in unspecified limb: Secondary | ICD-10-CM

## 2012-03-31 DIAGNOSIS — E876 Hypokalemia: Secondary | ICD-10-CM | POA: Diagnosis not present

## 2012-03-31 DIAGNOSIS — S066X9A Traumatic subarachnoid hemorrhage with loss of consciousness of unspecified duration, initial encounter: Secondary | ICD-10-CM | POA: Diagnosis not present

## 2012-03-31 DIAGNOSIS — S72453A Displaced supracondylar fracture without intracondylar extension of lower end of unspecified femur, initial encounter for closed fracture: Secondary | ICD-10-CM

## 2012-03-31 DIAGNOSIS — R488 Other symbolic dysfunctions: Secondary | ICD-10-CM | POA: Diagnosis not present

## 2012-03-31 DIAGNOSIS — M79676 Pain in unspecified toe(s): Secondary | ICD-10-CM | POA: Diagnosis present

## 2012-03-31 DIAGNOSIS — Z66 Do not resuscitate: Secondary | ICD-10-CM | POA: Diagnosis not present

## 2012-03-31 DIAGNOSIS — S066X0A Traumatic subarachnoid hemorrhage without loss of consciousness, initial encounter: Secondary | ICD-10-CM | POA: Diagnosis not present

## 2012-03-31 DIAGNOSIS — S298XXA Other specified injuries of thorax, initial encounter: Secondary | ICD-10-CM | POA: Diagnosis not present

## 2012-03-31 DIAGNOSIS — S42409A Unspecified fracture of lower end of unspecified humerus, initial encounter for closed fracture: Secondary | ICD-10-CM | POA: Diagnosis not present

## 2012-03-31 DIAGNOSIS — S42413A Displaced simple supracondylar fracture without intercondylar fracture of unspecified humerus, initial encounter for closed fracture: Secondary | ICD-10-CM | POA: Diagnosis not present

## 2012-03-31 DIAGNOSIS — S199XXA Unspecified injury of neck, initial encounter: Secondary | ICD-10-CM | POA: Diagnosis not present

## 2012-03-31 DIAGNOSIS — R269 Unspecified abnormalities of gait and mobility: Secondary | ICD-10-CM | POA: Diagnosis not present

## 2012-03-31 DIAGNOSIS — S42409B Unspecified fracture of lower end of unspecified humerus, initial encounter for open fracture: Secondary | ICD-10-CM | POA: Diagnosis not present

## 2012-03-31 DIAGNOSIS — S0990XA Unspecified injury of head, initial encounter: Secondary | ICD-10-CM

## 2012-03-31 DIAGNOSIS — I10 Essential (primary) hypertension: Secondary | ICD-10-CM | POA: Diagnosis present

## 2012-03-31 DIAGNOSIS — S6990XA Unspecified injury of unspecified wrist, hand and finger(s), initial encounter: Secondary | ICD-10-CM | POA: Diagnosis not present

## 2012-03-31 DIAGNOSIS — S066XAA Traumatic subarachnoid hemorrhage with loss of consciousness status unknown, initial encounter: Secondary | ICD-10-CM

## 2012-03-31 DIAGNOSIS — M25529 Pain in unspecified elbow: Secondary | ICD-10-CM | POA: Diagnosis not present

## 2012-03-31 DIAGNOSIS — Z79899 Other long term (current) drug therapy: Secondary | ICD-10-CM | POA: Diagnosis not present

## 2012-03-31 DIAGNOSIS — S99929A Unspecified injury of unspecified foot, initial encounter: Secondary | ICD-10-CM | POA: Diagnosis not present

## 2012-03-31 DIAGNOSIS — M7989 Other specified soft tissue disorders: Secondary | ICD-10-CM | POA: Diagnosis not present

## 2012-03-31 DIAGNOSIS — M542 Cervicalgia: Secondary | ICD-10-CM | POA: Diagnosis not present

## 2012-03-31 DIAGNOSIS — W19XXXA Unspecified fall, initial encounter: Secondary | ICD-10-CM | POA: Diagnosis not present

## 2012-03-31 HISTORY — DX: Traumatic subarachnoid hemorrhage with loss of consciousness of unspecified duration, initial encounter: S06.6X9A

## 2012-03-31 HISTORY — DX: Traumatic subarachnoid hemorrhage with loss of consciousness status unknown, initial encounter: S06.6XAA

## 2012-03-31 LAB — COMPREHENSIVE METABOLIC PANEL
AST: 29 U/L (ref 0–37)
BUN: 17 mg/dL (ref 6–23)
CO2: 24 mEq/L (ref 19–32)
Calcium: 8.3 mg/dL — ABNORMAL LOW (ref 8.4–10.5)
Chloride: 104 mEq/L (ref 96–112)
Creatinine, Ser: 0.53 mg/dL (ref 0.50–1.10)
GFR calc Af Amer: >90 mL/min (ref >90)
GFR calc non Af Amer: 79 mL/min — ABNORMAL LOW (ref >90)
Glucose, Bld: 115 mg/dL — ABNORMAL HIGH (ref 70–99)
Total Bilirubin: 1 mg/dL (ref 0.3–1.2)

## 2012-03-31 LAB — CBC
HCT: 33.4 % — ABNORMAL LOW (ref 36.0–46.0)
MCH: 33.2 pg (ref 26.0–34.0)
MCV: 95.7 fL (ref 78.0–100.0)
Platelets: 134 10*3/uL — ABNORMAL LOW (ref 150–400)
RBC: 3.49 MIL/uL — ABNORMAL LOW (ref 3.87–5.11)
WBC: 9 10*3/uL (ref 4.0–10.5)

## 2012-03-31 LAB — URINALYSIS, ROUTINE W REFLEX MICROSCOPIC
Bilirubin Urine: NEGATIVE
Glucose, UA: 250 mg/dL — AB
Hgb urine dipstick: NEGATIVE
Ketones, ur: NEGATIVE mg/dL
Nitrite: NEGATIVE
Specific Gravity, Urine: 1.015 (ref 1.005–1.030)
pH: 7 (ref 5.0–8.0)

## 2012-03-31 MED ORDER — OCUVITE PO TABS
1.0000 | ORAL_TABLET | Freq: Two times a day (BID) | ORAL | Status: DC
  Administered 2012-03-31 – 2012-04-02 (×6): 1 via ORAL
  Filled 2012-03-31 (×8): qty 1

## 2012-03-31 MED ORDER — ONDANSETRON HCL 4 MG PO TABS: 4.0000 mg | ORAL_TABLET | Freq: Four times a day (QID) | ORAL | Status: DC | PRN

## 2012-03-31 MED ORDER — CLONIDINE HCL 0.1 MG PO TABS
0.1000 mg | ORAL_TABLET | ORAL | Status: DC | PRN
  Filled 2012-03-31: qty 1

## 2012-03-31 MED ORDER — MORPHINE SULFATE 2 MG/ML IJ SOLN
2.0000 mg | INTRAMUSCULAR | Status: DC | PRN
  Administered 2012-04-01 – 2012-04-02 (×3): 2 mg via INTRAVENOUS
  Filled 2012-03-31 (×3): qty 1

## 2012-03-31 MED ORDER — ALUM & MAG HYDROXIDE-SIMETH 200-200-20 MG/5ML PO SUSP: 30.0000 mL | Freq: Four times a day (QID) | ORAL | Status: DC | PRN

## 2012-03-31 MED ORDER — ONDANSETRON HCL 4 MG/2ML IJ SOLN: 4.0000 mg | Freq: Four times a day (QID) | INTRAMUSCULAR | Status: DC | PRN

## 2012-03-31 MED ORDER — SODIUM CHLORIDE 0.9 % IJ SOLN
3.0000 mL | Freq: Two times a day (BID) | INTRAMUSCULAR | Status: DC
  Administered 2012-04-01 (×2): 3 mL via INTRAVENOUS

## 2012-03-31 MED ORDER — METOPROLOL TARTRATE 50 MG PO TABS
50.0000 mg | ORAL_TABLET | Freq: Two times a day (BID) | ORAL | Status: DC
  Administered 2012-03-31 – 2012-04-02 (×6): 50 mg via ORAL
  Filled 2012-03-31 (×8): qty 1

## 2012-03-31 MED ORDER — NIMODIPINE 60 MG/20ML PO SOLN
60.0000 mg | ORAL | Status: DC
  Administered 2012-03-31: 60 mg via ORAL
  Filled 2012-03-31 (×7): qty 20

## 2012-03-31 MED ORDER — ACETAMINOPHEN 500 MG PO TABS: 500.0000 mg | ORAL_TABLET | Freq: Four times a day (QID) | ORAL | Status: DC | PRN

## 2012-03-31 MED ORDER — HYDRALAZINE HCL 20 MG/ML IJ SOLN: 5.0000 mg | Freq: Four times a day (QID) | INTRAMUSCULAR | Status: DC | PRN

## 2012-03-31 MED ORDER — AMLODIPINE BESYLATE 10 MG PO TABS
10.0000 mg | ORAL_TABLET | Freq: Every day | ORAL | Status: DC
  Administered 2012-03-31 – 2012-04-02 (×3): 10 mg via ORAL
  Filled 2012-03-31 (×3): qty 1

## 2012-03-31 MED ORDER — POTASSIUM CHLORIDE IN NACL 20-0.9 MEQ/L-% IV SOLN
INTRAVENOUS | Status: DC
  Administered 2012-03-31: 02:00:00 via INTRAVENOUS
  Filled 2012-03-31 (×2): qty 1000

## 2012-03-31 MED ORDER — ZOLPIDEM TARTRATE 5 MG PO TABS
2.5000 mg | ORAL_TABLET | Freq: Every evening | ORAL | Status: DC | PRN
  Administered 2012-03-31: 2.5 mg via ORAL
  Filled 2012-03-31: qty 1

## 2012-03-31 MED ORDER — LOSARTAN POTASSIUM 50 MG PO TABS
100.0000 mg | ORAL_TABLET | Freq: Every day | ORAL | Status: DC
  Administered 2012-03-31 – 2012-04-02 (×3): 100 mg via ORAL
  Filled 2012-03-31 (×3): qty 2

## 2012-03-31 MED ORDER — RALOXIFENE HCL 60 MG PO TABS
60.0000 mg | ORAL_TABLET | Freq: Every day | ORAL | Status: DC
  Administered 2012-03-31 – 2012-04-02 (×3): 60 mg via ORAL
  Filled 2012-03-31 (×3): qty 1

## 2012-03-31 NOTE — H&P (Addendum)
PCP:   Ginette Otto, MD   Chief Complaint:  Humeral fracture  HPI: This is a high functioning 78 year old female who is generally healthy, she recently moved to an independent living facility. She uses a cane to ambulate. The patient does not usually fall however, today but she fell twice. Per the patient's both were mechanical falls, she tripped and fell. she denies any chest pains, any palpitations or shortness of breath. Patient has no cardiac issues. She denies any history of MI or any prior episode of congestive heart failure.  It was a non-witnessed fall, she was able to alert the staff, they came and called 911. The patient did hit her head with the first fall and sustained a bruise on her right temple. Imaging done the ER reveals a left humeral fracture. The hospitalist service was was called and requested to admit. History provided by both the patient and her daughter who is at the bedside.    Review of Systems:  The patient denies anorexia, fever, weight loss,, vision loss, decreased hearing, hoarseness, chest pain, syncope, dyspnea on exertion, peripheral edema, balance deficits, hemoptysis, abdominal pain, melena, hematochezia, severe indigestion/heartburn, hematuria, incontinence, genital sores, muscle weakness, suspicious skin lesions, transient blindness, depression, unusual weight change, abnormal bleeding, enlarged lymph nodes, angioedema, and breast masses.  Past Medical History: Past Medical History  Diagnosis Date  . Arthritis   . Osteoarthritis   . Hypertension   . Cataracts, bilateral    Past Surgical History  Procedure Date  . Ankle fracture surgery   . Abdominal hysterectomy     Medications: Prior to Admission medications   Medication Sig Start Date End Date Taking? Authorizing Provider  acetaminophen (TYLENOL) 500 MG tablet Take 500 mg by mouth every 6 (six) hours as needed. Pain   Yes Historical Provider, MD  amLODipine (NORVASC) 10 MG tablet Take 10  mg by mouth daily.   Yes Historical Provider, MD  aspirin EC 81 MG tablet Take 81 mg by mouth daily.   Yes Historical Provider, MD  beta carotene w/minerals (OCUVITE) tablet Take 1 tablet by mouth 2 (two) times daily.    Yes Historical Provider, MD  losartan (COZAAR) 100 MG tablet Take 100 mg by mouth daily.   Yes Historical Provider, MD  metoprolol (LOPRESSOR) 50 MG tablet Take 50 mg by mouth 2 (two) times daily.   Yes Historical Provider, MD  raloxifene (EVISTA) 60 MG tablet Take 60 mg by mouth daily.   Yes Historical Provider, MD  zolpidem (AMBIEN) 5 MG tablet Take 2.5 mg by mouth at bedtime as needed. sleep   Yes Historical Provider, MD    Allergies:  No Known Allergies  Social History:  reports that she has never smoked. She does not have any smokeless tobacco history on file. She reports that she does not drink alcohol or use illicit drugs.uses a cane. Patients daughter Fulton Reek [phone]  Family History: hypertension  Physical Exam: Filed Vitals:   03/30/12 2027 03/30/12 2112 03/30/12 2116 03/30/12 2300  BP: 133/46  145/59 117/65  Pulse: 70 69  58  Temp: 98.4 F (36.9 C)     TempSrc: Oral     Resp: 18   14  SpO2: 95% 98%  95%    General:  Alert and oriented times three, well developed and nourished, no acute distress Eyes: PERRLA, pink conjunctiva, no scleral icterus, bruising over her right forehead ENT: Moist oral mucosa, neck supple, no thyromegaly Lungs: clear to ascultation, no wheeze, no crackles, no use  of accessory muscles Cardiovascular: regular rate and rhythm, no regurgitation, no gallops, no murmurs. No carotid bruits, no JVD Abdomen: soft, positive BS, non-tender, non-distended, no organomegaly, not an acute abdomen GU: not examined Neuro: CN II - XII grossly intact, sensation intact Musculoskeletal: strength 5/5 all extremities, no clubbing, cyanosis or edema, left arm in a soft cast, bruising over left ring finger. Skin: no rash, no subcutaneous crepitation,  no decubitus Psych: appropriate patient   Labs on Admission:   [facility] 03/30/12 2225  NA 137  K 3.1*  CL 101  CO2 24  GLUCOSE 178*  BUN 23  CREATININE 0.60  CALCIUM 9.0  MG --  PHOS --   No results found for this basename: AST:2,ALT:2,ALKPHOS:2,BILITOT:2,PROT:2,ALBUMIN:2 in the last 72 hours No results found for this basename: LIPASE:2,AMYLASE:2 in the last 72 hours  Basename 03/30/12 2225  WBC 9.6  NEUTROABS 6.5  HGB 12.6  HCT 36.6  MCV 95.6  PLT 147*   No results found for this basename: CKTOTAL:3,CKMB:3,CKMBINDEX:3,TROPONINI:3 in the last 72 hours No components found with this basename: POCBNP:3 No results found for this basename: DDIMER:2 in the last 72 hours No results found for this basename: HGBA1C:2 in the last 72 hours No results found for this basename: CHOL:2,HDL:2,LDLCALC:2,TRIG:2,CHOLHDL:2,LDLDIRECT:2 in the last 72 hours No results found for this basename: TSH,T4TOTAL,FREET3,T3FREE,THYROIDAB in the last 72 hours No results found for this basename: VITAMINB12:2,FOLATE:2,FERRITIN:2,TIBC:2,IRON:2,RETICCTPCT:2 in the last 72 hours  Micro Results: No results found for this or any previous visit (from the past 240 hour(s)).   Radiological Exams on Admission: Dg Elbow 2 Views Left  03/30/2012  *RADIOLOGY REPORT*  Clinical Data: Larey Seat.  Left elbow pain.  LEFT ELBOW - 2 VIEW  Comparison: None  Findings: There is a comminuted supracondylar elbow fracture.  The radius and ulna appear intact.  IMPRESSION: Complex displaced supracondylar fracture of the distal humerus.   Original Report Authenticated By: Rudie Meyer, M.D.    Ct Head Wo Contrast  03/30/2012  *RADIOLOGY REPORT*  Clinical Data:  Head and neck pain post fall  CT HEAD WITHOUT CONTRAST CT CERVICAL SPINE WITHOUT CONTRAST  Technique:  Multidetector CT imaging of the head and cervical spine was performed following the standard protocol without intravenous contrast.  Multiplanar CT image reconstructions of the  cervical spine were also generated.  Comparison:  None  CT HEAD  Findings: Generalized atrophy. Normal ventricular morphology. No midline shift or mass effect. Small vessel chronic ischemic changes of deep cerebral white matter. At the right vertex, a small amount of high attenuation acute subarachnoid blood is identified. No additional intracranial hemorrhage, mass lesion, or acute infarction. 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 Visualized paranasal sinuses and mastoid air cells clear. Small sclerotic focus identified at the right frontal bone with a contiguous question exostosis arising from the inner table of the calvarium, unchanged from 2011 radiographs.  IMPRESSION: Atrophy with small vessel chronic ischemic changes of deep cerebral white matter. Small amount of high attenuation subarachnoid hemorrhage is seen at the right vertex. 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 No additional acute intracranial abnormalities.  CT CERVICAL SPINE  Findings: Scattered atherosclerotic calcifications within the carotid systems. Biapical lung scarring. Visualized skull base intact. Disc space narrowing with endplate spur formation at C4-C5, C5-C6, C6-C7. Vertebral body heights maintained without fracture or subluxation. Minimal scattered facet degenerative changes. Prevertebral soft tissues normal thickness. Osseous demineralization.  IMPRESSION:  Degenerative disc disease changes cervical spine at C4-C5 through C6-C7. No acute cervical spine abnormalities identified.  Critical Value/emergent  results were called by telephone at the time of interpretation on 03/30/2012 at 2014 hours to Dr. Radford Pax, who verbally acknowledged these results.   Original Report Authenticated By: Ulyses Southward, M.D.    Ct Cervical Spine Wo Contrast  03/30/2012  *RADIOLOGY REPORT*  Clinical Data:  Head and neck pain post fall  CT HEAD WITHOUT CONTRAST CT CERVICAL SPINE WITHOUT CONTRAST  Technique:  Multidetector CT imaging of the head and  cervical spine was performed following the standard protocol without intravenous contrast.  Multiplanar CT image reconstructions of the cervical spine were also generated.  Comparison:  None  CT HEAD  Findings: Generalized atrophy. Normal ventricular morphology. No midline shift or mass effect. Small vessel chronic ischemic changes of deep cerebral white matter. At the right vertex, a small amount of high attenuation acute subarachnoid blood is identified. No additional intracranial hemorrhage, mass lesion, or acute infarction. 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 Visualized paranasal sinuses and mastoid air cells clear. Small sclerotic focus identified at the right frontal bone with a contiguous question exostosis arising from the inner table of the calvarium, unchanged from 2011 radiographs.  IMPRESSION: Atrophy with small vessel chronic ischemic changes of deep cerebral white matter. Small amount of high attenuation subarachnoid hemorrhage is seen at the right vertex. 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 No additional acute intracranial abnormalities.  CT CERVICAL SPINE  Findings: Scattered atherosclerotic calcifications within the carotid systems. Biapical lung scarring. Visualized skull base intact. Disc space narrowing with endplate spur formation at C4-C5, C5-C6, C6-C7. Vertebral body heights maintained without fracture or subluxation. Minimal scattered facet degenerative changes. Prevertebral soft tissues normal thickness. Osseous demineralization.  IMPRESSION:  Degenerative disc disease changes cervical spine at C4-C5 through C6-C7. No acute cervical spine abnormalities identified.  Critical Value/emergent results were called by telephone at the time of interpretation on 03/30/2012 at 2014 hours to Dr. Radford Pax, who verbally acknowledged these results.   Original Report Authenticated By: Ulyses Southward, M.D.    Dg Chest Portable 1 View  03/30/2012  *RADIOLOGY REPORT*  Clinical Data: Elbow fracture,  fall  PORTABLE CHEST - 1 VIEW  Comparison: Portable exam 2230 hours without priors for Romeo Apple  Findings: Upper normal heart size. Calcified tortuous aorta. Pulmonary vascularity normal. Slight rotation to the left. Lungs appear emphysematous with biapical scarring. No acute infiltrate, pleural effusion, or pneumothorax. Probable bilateral chronic rotator cuff tears. Osseous demineralization.  IMPRESSION: Emphysematous changes with biapical scarring. No acute abnormalities.   Original Report Authenticated By: Ulyses Southward, M.D.    Dg Hand Complete Left  03/30/2012  *RADIOLOGY REPORT*  Clinical Data: Larey Seat.  Injured ring finger.  LEFT HAND - COMPLETE 3+ VIEW  Comparison: None  Findings: There are moderate osteoarthritic type degenerative changes involving the DIP and PIP joints of the fingers.  Moderate osteoporosis is also noted.  There is soft tissue swelling involving the ring finger but no definite acute fracture.  Suspect a remote healed fifth metacarpal fracture.  IMPRESSION:  1.  Osteoporosis and osteoarthritic type degenerative changes. 2.  No acute fracture.   Original Report Authenticated By: Rudie Meyer, M.D.     Assessment/Plan Present on Admission:  . Humeral distal fracture Falls Admit to telemetry floor, monitor  Orthopedic surgeon to see patient in a.m. Suggest physical therapy at discharge  Small subarachnoid hemorrhage aspirin discontinued, no anticoagulations for DVT prophylaxis Repeat CT head in a.m.  Neurosurgeon contacted by ER doctor, no additional intervention recommended Will consult neurology Nimodipine started, IVF hydration, neurochecks and PRN blood pressure  medications Hypokalemia Repleting with IV fluids Hard of hearing Hypertension Stable resume home medications   DO NOT INTUBATE SCDs for DVT prophylaxis   Heather Streeper 03/31/2012, 12:23 AM

## 2012-03-31 NOTE — Consult Note (Signed)
Reason for Consult:left distal humerus fracture Referring Physician: SAMIAH RICKLEFS is an 78 y.o. female.  HPI: s/p fall with displaced left distal humerus fracture  Past Medical History  Diagnosis Date  . Arthritis   . Osteoarthritis   . Hypertension   . Cataracts, bilateral     Past Surgical History  Procedure Date  . Ankle fracture surgery   . Abdominal hysterectomy     No family history on file.  Social History:  reports that she has never smoked. She does not have any smokeless tobacco history on file. She reports that she does not drink alcohol or use illicit drugs.  Allergies: No Known Allergies  Medications:  Prior to Admission:  Prescriptions prior to admission  Medication Sig Dispense Refill  . acetaminophen (TYLENOL) 500 MG tablet Take 500 mg by mouth every 6 (six) hours as needed. Pain      . amLODipine (NORVASC) 10 MG tablet Take 10 mg by mouth daily.      Marland Kitchen aspirin EC 81 MG tablet Take 81 mg by mouth daily.      . beta carotene w/minerals (OCUVITE) tablet Take 1 tablet by mouth 2 (two) times daily.       Marland Kitchen losartan (COZAAR) 100 MG tablet Take 100 mg by mouth daily.      . metoprolol (LOPRESSOR) 50 MG tablet Take 50 mg by mouth 2 (two) times daily.      . raloxifene (EVISTA) 60 MG tablet Take 60 mg by mouth daily.      Marland Kitchen zolpidem (AMBIEN) 5 MG tablet Take 2.5 mg by mouth at bedtime as needed. sleep        Results for orders placed during the hospital encounter of 03/30/12 (from the past 48 hour(s))  CBC WITH DIFFERENTIAL     Status: Abnormal   Collection Time   03/30/12 10:25 PM      Component Value Range Comment   WBC 9.6  4.0 - 10.5 K/uL    RBC 3.83 (*) 3.87 - 5.11 MIL/uL    Hemoglobin 12.6  12.0 - 15.0 g/dL    HCT 09.8  11.9 - 14.7 %    MCV 95.6  78.0 - 100.0 fL    MCH 32.9  26.0 - 34.0 pg    MCHC 34.4  30.0 - 36.0 g/dL    RDW 82.9  56.2 - 13.0 %    Platelets 147 (*) 150 - 400 K/uL    Neutrophils Relative 68  43 - 77 %    Neutro Abs 6.5   1.7 - 7.7 K/uL    Lymphocytes Relative 19  12 - 46 %    Lymphs Abs 1.9  0.7 - 4.0 K/uL    Monocytes Relative 9  3 - 12 %    Monocytes Absolute 0.9  0.1 - 1.0 K/uL    Eosinophils Relative 3  0 - 5 %    Eosinophils Absolute 0.3  0.0 - 0.7 K/uL    Basophils Relative 0  0 - 1 %    Basophils Absolute 0.0  0.0 - 0.1 K/uL   BASIC METABOLIC PANEL     Status: Abnormal   Collection Time   03/30/12 10:25 PM      Component Value Range Comment   Sodium 137  135 - 145 mEq/L    Potassium 3.1 (*) 3.5 - 5.1 mEq/L    Chloride 101  96 - 112 mEq/L    CO2 24  19 - 32 mEq/L  Glucose, Bld 178 (*) 70 - 99 mg/dL    BUN 23  6 - 23 mg/dL    Creatinine, Ser 9.14  0.50 - 1.10 mg/dL    Calcium 9.0  8.4 - 78.2 mg/dL    GFR calc non Af Amer 76 (*) >90 mL/min    GFR calc Af Amer 88 (*) >90 mL/min   PROTIME-INR     Status: Normal   Collection Time   03/30/12 10:38 PM      Component Value Range Comment   Prothrombin Time 13.2  11.6 - 15.2 seconds    INR 1.01  0.00 - 1.49   URINALYSIS, ROUTINE W REFLEX MICROSCOPIC     Status: Abnormal   Collection Time   03/31/12 12:59 AM      Component Value Range Comment   Color, Urine YELLOW  YELLOW    APPearance CLEAR  CLEAR    Specific Gravity, Urine 1.015  1.005 - 1.030    pH 7.0  5.0 - 8.0    Glucose, UA 250 (*) NEGATIVE mg/dL    Hgb urine dipstick NEGATIVE  NEGATIVE    Bilirubin Urine NEGATIVE  NEGATIVE    Ketones, ur NEGATIVE  NEGATIVE mg/dL    Protein, ur NEGATIVE  NEGATIVE mg/dL    Urobilinogen, UA 0.2  0.0 - 1.0 mg/dL    Nitrite NEGATIVE  NEGATIVE    Leukocytes, UA NEGATIVE  NEGATIVE MICROSCOPIC NOT DONE ON URINES WITH NEGATIVE PROTEIN, BLOOD, LEUKOCYTES, NITRITE, OR GLUCOSE <1000 mg/dL.  CBC     Status: Abnormal   Collection Time   03/31/12  5:15 AM      Component Value Range Comment   WBC 9.0  4.0 - 10.5 K/uL    RBC 3.49 (*) 3.87 - 5.11 MIL/uL    Hemoglobin 11.6 (*) 12.0 - 15.0 g/dL    HCT 95.6 (*) 21.3 - 46.0 %    MCV 95.7  78.0 - 100.0 fL    MCH  33.2  26.0 - 34.0 pg    MCHC 34.7  30.0 - 36.0 g/dL    RDW 08.6  57.8 - 46.9 %    Platelets 134 (*) 150 - 400 K/uL   COMPREHENSIVE METABOLIC PANEL     Status: Abnormal   Collection Time   03/31/12  5:15 AM      Component Value Range Comment   Sodium 139  135 - 145 mEq/L    Potassium 3.5  3.5 - 5.1 mEq/L    Chloride 104  96 - 112 mEq/L    CO2 24  19 - 32 mEq/L    Glucose, Bld 115 (*) 70 - 99 mg/dL    BUN 17  6 - 23 mg/dL    Creatinine, Ser 6.29  0.50 - 1.10 mg/dL    Calcium 8.3 (*) 8.4 - 10.5 mg/dL    Total Protein 5.5 (*) 6.0 - 8.3 g/dL    Albumin 3.0 (*) 3.5 - 5.2 g/dL    AST 29  0 - 37 U/L    ALT 20  0 - 35 U/L    Alkaline Phosphatase 38 (*) 39 - 117 U/L    Total Bilirubin 1.0  0.3 - 1.2 mg/dL    GFR calc non Af Amer 79 (*) >90 mL/min    GFR calc Af Amer >90  >90 mL/min     Dg Elbow 2 Views Left  03/30/2012  *RADIOLOGY REPORT*  Clinical Data: Larey Seat.  Left elbow pain.  LEFT ELBOW - 2 VIEW  Comparison: None  Findings: There is a comminuted supracondylar elbow fracture.  The radius and ulna appear intact.  IMPRESSION: Complex displaced supracondylar fracture of the distal humerus.   Original Report Authenticated By: Rudie Meyer, M.D.    Ct Head Wo Contrast  03/31/2012  *RADIOLOGY REPORT*  Clinical Data: Fall.  Intracranial hemorrhage.  Follow-up.  CT HEAD WITHOUT CONTRAST  Technique:  Contiguous axial images were obtained from the base of the skull through the vertex without contrast.  Comparison: 03/30/2012.  Findings: The previously noted tiny amount of subarachnoid hemorrhage seen in the right convexity is not apparent on the present examination.  No new intracranial hemorrhage noted.  No skull fracture.  Small vessel disease type changes.  Remote infarct left cerebellum.  Global atrophy without hydrocephalus.  No CT evidence of large acute infarct.  Right frontal calvarial 1 cm bony lesion may be an exostosis or meningioma. No associated mass effect.  IMPRESSION: The previously  noted tiny amount of subarachnoid hemorrhage seen in the right convexity is not apparent on the present examination.  No new intracranial hemorrhage noted.   Original Report Authenticated By: Lacy Duverney, M.D.    Ct Head Wo Contrast  03/30/2012  *RADIOLOGY REPORT*  Clinical Data:  Head and neck pain post fall  CT HEAD WITHOUT CONTRAST CT CERVICAL SPINE WITHOUT CONTRAST  Technique:  Multidetector CT imaging of the head and cervical spine was performed following the standard protocol without intravenous contrast.  Multiplanar CT image reconstructions of the cervical spine were also generated.  Comparison:  None  CT HEAD  Findings: Generalized atrophy. Normal ventricular morphology. No midline shift or mass effect. Small vessel chronic ischemic changes of deep cerebral white matter. At the right vertex, a small amount of high attenuation acute subarachnoid blood is identified. No additional intracranial hemorrhage, mass lesion, or acute infarction. 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 Visualized paranasal sinuses and mastoid air cells clear. Small sclerotic focus identified at the right frontal bone with a contiguous question exostosis arising from the inner table of the calvarium, unchanged from 2011 radiographs.  IMPRESSION: Atrophy with small vessel chronic ischemic changes of deep cerebral white matter. Small amount of high attenuation subarachnoid hemorrhage is seen at the right vertex. 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 No additional acute intracranial abnormalities.  CT CERVICAL SPINE  Findings: Scattered atherosclerotic calcifications within the carotid systems. Biapical lung scarring. Visualized skull base intact. Disc space narrowing with endplate spur formation at C4-C5, C5-C6, C6-C7. Vertebral body heights maintained without fracture or subluxation. Minimal scattered facet degenerative changes. Prevertebral soft tissues normal thickness. Osseous demineralization.  IMPRESSION:  Degenerative  disc disease changes cervical spine at C4-C5 through C6-C7. No acute cervical spine abnormalities identified.  Critical Value/emergent results were called by telephone at the time of interpretation on 03/30/2012 at 2014 hours to Dr. Radford Pax, who verbally acknowledged these results.   Original Report Authenticated By: Ulyses Southward, M.D.    Ct Cervical Spine Wo Contrast  03/30/2012  *RADIOLOGY REPORT*  Clinical Data:  Head and neck pain post fall  CT HEAD WITHOUT CONTRAST CT CERVICAL SPINE WITHOUT CONTRAST  Technique:  Multidetector CT imaging of the head and cervical spine was performed following the standard protocol without intravenous contrast.  Multiplanar CT image reconstructions of the cervical spine were also generated.  Comparison:  None  CT HEAD  Findings: Generalized atrophy. Normal ventricular morphology. No midline shift or mass effect. Small vessel chronic ischemic changes of deep cerebral white matter. At the right vertex, a  small amount of high attenuation acute subarachnoid blood is identified. No additional intracranial hemorrhage, mass lesion, or acute infarction. 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 Visualized paranasal sinuses and mastoid air cells clear. Small sclerotic focus identified at the right frontal bone with a contiguous question exostosis arising from the inner table of the calvarium, unchanged from 2011 radiographs.  IMPRESSION: Atrophy with small vessel chronic ischemic changes of deep cerebral white matter. Small amount of high attenuation subarachnoid hemorrhage is seen at the right vertex. 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 No additional acute intracranial abnormalities.  CT CERVICAL SPINE  Findings: Scattered atherosclerotic calcifications within the carotid systems. Biapical lung scarring. Visualized skull base intact. Disc space narrowing with endplate spur formation at C4-C5, C5-C6, C6-C7. Vertebral body heights maintained without fracture or subluxation. Minimal  scattered facet degenerative changes. Prevertebral soft tissues normal thickness. Osseous demineralization.  IMPRESSION:  Degenerative disc disease changes cervical spine at C4-C5 through C6-C7. No acute cervical spine abnormalities identified.  Critical Value/emergent results were called by telephone at the time of interpretation on 03/30/2012 at 2014 hours to Dr. Radford Pax, who verbally acknowledged these results.   Original Report Authenticated By: Ulyses Southward, M.D.    Dg Chest Portable 1 View  03/30/2012  *RADIOLOGY REPORT*  Clinical Data: Elbow fracture, fall  PORTABLE CHEST - 1 VIEW  Comparison: Portable exam 2230 hours without priors for Romeo Apple  Findings: Upper normal heart size. Calcified tortuous aorta. Pulmonary vascularity normal. Slight rotation to the left. Lungs appear emphysematous with biapical scarring. No acute infiltrate, pleural effusion, or pneumothorax. Probable bilateral chronic rotator cuff tears. Osseous demineralization.  IMPRESSION: Emphysematous changes with biapical scarring. No acute abnormalities.   Original Report Authenticated By: Ulyses Southward, M.D.    Dg Hand Complete Left  03/30/2012  *RADIOLOGY REPORT*  Clinical Data: Larey Seat.  Injured ring finger.  LEFT HAND - COMPLETE 3+ VIEW  Comparison: None  Findings: There are moderate osteoarthritic type degenerative changes involving the DIP and PIP joints of the fingers.  Moderate osteoporosis is also noted.  There is soft tissue swelling involving the ring finger but no definite acute fracture.  Suspect a remote healed fifth metacarpal fracture.  IMPRESSION:  1.  Osteoporosis and osteoarthritic type degenerative changes. 2.  No acute fracture.   Original Report Authenticated By: Rudie Meyer, M.D.    Dg Toe Great Right  03/31/2012  *RADIOLOGY REPORT*  Clinical Data: Fall, right toe pain  RIGHT GREAT TOE  Comparison: None.  Findings: No fracture or dislocation is seen.  Mild irregularity involving the base of the first distal phalanx  on the oblique view is favored to be degenerative, given lack of corresponding abnormality on the frontal view.  Mild degenerative changes of the MTP and IP joint.  Possible mild diffuse soft tissue swelling.  IMPRESSION: No fracture or dislocation is seen.   Original Report Authenticated By: Charline Bills, M.D.     Review of Systems  All other systems reviewed and are negative.   Blood pressure 134/70, pulse 72, temperature 98.4 F (36.9 C), temperature source Oral, resp. rate 20, height 5\' 3"  (1.6 m), weight 64.4 kg (141 lb 15.6 oz), SpO2 94.00%. Physical Exam  Constitutional: She appears well-developed and well-nourished.  HENT:  Head: Normocephalic.  Cardiovascular: Normal rate.   Respiratory: Effort normal.  Musculoskeletal:       Left elbow: tenderness found.       Arms: Neurological: She is alert.  Skin: Skin is warm.    Assessment/Plan: As above  Will need ORIF vs pinning of fracture electively  Will need social services, PT/OT consults for moblization and post surgical placement  Doubt she will be able to return to prior level of ambulation  Have discussed with Dr. Daphine Deutscher A 03/31/2012, 5:10 PM

## 2012-03-31 NOTE — Progress Notes (Addendum)
[facility] PROGRESS NOTE  Melissa Carroll ZOX:[id number] DOB: 05/29/1916 DOA: 03/30/2012 PCP: Ginette Otto, MD  Assessment/Plan: 1. S/p multiple mechanical falls prior to admission--PT. 2. Complex displaced supracondylar fracture of the distal humerus--per orthopedics. 3. Right toe pain--check x-ray. 4. Traumatic SAH--asymptomatic. Repeat head CT this AM--no SAH seen. Discussed with Dr. Terrilee Files neurosurgery. He recommends no further evaluation, discontinue nimodipine. Patient may proceed with any orthopedic surgery immediately as deemed necessary by orthopedics. No indication to transfer to [facility].  Code Status: DNR Family Communication: discussed with daughter at bedside Disposition Plan: pending further treatment  Brendia Sacks, MD  [facility] Team 6 Pager [phone]. If 8PM-8AM, please contact night-coverage at [email or web address], password [email or web address] 03/31/2012, 10:14 AM  LOS: 1 day   Brief narrative: 89-old-woman with history of 2 mechanical falls prior to admission found to have small SAH and elbow fracture.  Consultants:  Orthopedics   Procedures:    HPI/Subjective: Complains of right toe pain and left arm pain. No headache.  Objective: Filed Vitals:   03/31/12 0030 03/31/12 0103 03/31/12 0140 03/31/12 0520  BP: 133/59 138/55 148/70 148/76  Pulse: 73 74 75 65  Temp:   98.1 F (36.7 C) 98.2 F (36.8 C)  TempSrc:   Oral Oral  Resp: 13 18 20 18   Height:   5\' 3"  (1.6 m)   Weight:   64.4 kg (141 lb 15.6 oz)   SpO2: 94% 96% 93% 91%   No intake or output data in the 24 hours ending 03/31/12 1014 Filed Weights   03/31/12 0140  Weight: 64.4 kg (141 lb 15.6 oz)    Exam:   General:  Appears calm and comfortable  Eyes: PERRL, normal lids, irises  ENT: grossly normal hearing  Cardiovascular: RRR, no m/r/g. No LE edema.  Respiratory: CTA bilaterally, no w/r/r. Normal respiratory effort.  Musculoskeletal: grossly normal tone BUE/BLE, left arm  splinted. Left fingers pink, normal sensation reported. Right great toe with significant bruising. Pain with movement.  Psychiatric: grossly normal mood and affect, speech fluent and appropriate  Data Reviewed: Basic Metabolic Panel:  Lab 03/31/12 8295 03/30/12 2225  NA 139 137  K 3.5 3.1*  CL 104 101  CO2 24 24  GLUCOSE 115* 178*  BUN 17 23  CREATININE 0.53 0.60  CALCIUM 8.3* 9.0  MG -- --  PHOS -- --   Liver Function Tests:  Lab 03/31/12 0515  AST 29  ALT 20  ALKPHOS 38*  BILITOT 1.0  PROT 5.5*  ALBUMIN 3.0*   CBC:  Lab 03/31/12 0515 03/30/12 2225  WBC 9.0 9.6  NEUTROABS -- 6.5  HGB 11.6* 12.6  HCT 33.4* 36.6  MCV 95.7 95.6  PLT 134* 147*    Studies: Dg Elbow 2 Views Left  03/30/2012  *RADIOLOGY REPORT*  Clinical Data: Larey Seat.  Left elbow pain.  LEFT ELBOW - 2 VIEW  Comparison: None  Findings: There is a comminuted supracondylar elbow fracture.  The radius and ulna appear intact.  IMPRESSION: Complex displaced supracondylar fracture of the distal humerus.   Original Report Authenticated By: Rudie Meyer, M.D.    Ct Head Wo Contrast  03/31/2012  *RADIOLOGY REPORT*  Clinical Data: Fall.  Intracranial hemorrhage.  Follow-up.  CT HEAD WITHOUT CONTRAST  Technique:  Contiguous axial images were obtained from the base of the skull through the vertex without contrast.  Comparison: 03/30/2012.  Findings: The previously noted tiny amount of subarachnoid hemorrhage seen in the right convexity is not apparent on the present examination.  No new intracranial hemorrhage noted.  No skull fracture.  Small vessel disease type changes.  Remote infarct left cerebellum.  Global atrophy without hydrocephalus.  No CT evidence of large acute infarct.  Right frontal calvarial 1 cm bony lesion may be an exostosis or meningioma. No associated mass effect.  IMPRESSION: The previously noted tiny amount of subarachnoid hemorrhage seen in the right convexity is not apparent on the present  examination.  No new intracranial hemorrhage noted.   Original Report Authenticated By: Lacy Duverney, M.D.    Ct Head Wo Contrast  03/30/2012  *RADIOLOGY REPORT*  Clinical Data:  Head and neck pain post fall  CT HEAD WITHOUT CONTRAST CT CERVICAL SPINE WITHOUT CONTRAST  Technique:  Multidetector CT imaging of the head and cervical spine was performed following the standard protocol without intravenous contrast.  Multiplanar CT image reconstructions of the cervical spine were also generated.  Comparison:  None  CT HEAD  Findings: Generalized atrophy. Normal ventricular morphology. No midline shift or mass effect. Small vessel chronic ischemic changes of deep cerebral white matter. At the right vertex, a small amount of high attenuation acute subarachnoid blood is identified. No additional intracranial hemorrhage, mass lesion, or acute infarction. 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 Visualized paranasal sinuses and mastoid air cells clear. Small sclerotic focus identified at the right frontal bone with a contiguous question exostosis arising from the inner table of the calvarium, unchanged from 2011 radiographs.  IMPRESSION: Atrophy with small vessel chronic ischemic changes of deep cerebral white matter. Small amount of high attenuation subarachnoid hemorrhage is seen at the right vertex. 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 No additional acute intracranial abnormalities.  CT CERVICAL SPINE  Findings: Scattered atherosclerotic calcifications within the carotid systems. Biapical lung scarring. Visualized skull base intact. Disc space narrowing with endplate spur formation at C4-C5, C5-C6, C6-C7. Vertebral body heights maintained without fracture or subluxation. Minimal scattered facet degenerative changes. Prevertebral soft tissues normal thickness. Osseous demineralization.  IMPRESSION:  Degenerative disc disease changes cervical spine at C4-C5 through C6-C7. No acute cervical spine abnormalities  identified.  Critical Value/emergent results were called by telephone at the time of interpretation on 03/30/2012 at 2014 hours to Dr. Radford Pax, who verbally acknowledged these results.   Original Report Authenticated By: Ulyses Southward, M.D.    Ct Cervical Spine Wo Contrast  03/30/2012  *RADIOLOGY REPORT*  Clinical Data:  Head and neck pain post fall  CT HEAD WITHOUT CONTRAST CT CERVICAL SPINE WITHOUT CONTRAST  Technique:  Multidetector CT imaging of the head and cervical spine was performed following the standard protocol without intravenous contrast.  Multiplanar CT image reconstructions of the cervical spine were also generated.  Comparison:  None  CT HEAD  Findings: Generalized atrophy. Normal ventricular morphology. No midline shift or mass effect. Small vessel chronic ischemic changes of deep cerebral white matter. At the right vertex, a small amount of high attenuation acute subarachnoid blood is identified. No additional intracranial hemorrhage, mass lesion, or acute infarction. 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 Visualized paranasal sinuses and mastoid air cells clear. Small sclerotic focus identified at the right frontal bone with a contiguous question exostosis arising from the inner table of the calvarium, unchanged from 2011 radiographs.  IMPRESSION: Atrophy with small vessel chronic ischemic changes of deep cerebral white matter. Small amount of high attenuation subarachnoid hemorrhage is seen at the right vertex. 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 No additional acute intracranial abnormalities.  CT CERVICAL SPINE  Findings: Scattered atherosclerotic calcifications within the carotid  systems. Biapical lung scarring. Visualized skull base intact. Disc space narrowing with endplate spur formation at C4-C5, C5-C6, C6-C7. Vertebral body heights maintained without fracture or subluxation. Minimal scattered facet degenerative changes. Prevertebral soft tissues normal thickness. Osseous  demineralization.  IMPRESSION:  Degenerative disc disease changes cervical spine at C4-C5 through C6-C7. No acute cervical spine abnormalities identified.  Critical Value/emergent results were called by telephone at the time of interpretation on 03/30/2012 at 2014 hours to Dr. Radford Pax, who verbally acknowledged these results.   Original Report Authenticated By: Ulyses Southward, M.D.    Dg Chest Portable 1 View  03/30/2012  *RADIOLOGY REPORT*  Clinical Data: Elbow fracture, fall  PORTABLE CHEST - 1 VIEW  Comparison: Portable exam 2230 hours without priors for Romeo Apple  Findings: Upper normal heart size. Calcified tortuous aorta. Pulmonary vascularity normal. Slight rotation to the left. Lungs appear emphysematous with biapical scarring. No acute infiltrate, pleural effusion, or pneumothorax. Probable bilateral chronic rotator cuff tears. Osseous demineralization.  IMPRESSION: Emphysematous changes with biapical scarring. No acute abnormalities.   Original Report Authenticated By: Ulyses Southward, M.D.    Dg Hand Complete Left  03/30/2012  *RADIOLOGY REPORT*  Clinical Data: Larey Seat.  Injured ring finger.  LEFT HAND - COMPLETE 3+ VIEW  Comparison: None  Findings: There are moderate osteoarthritic type degenerative changes involving the DIP and PIP joints of the fingers.  Moderate osteoporosis is also noted.  There is soft tissue swelling involving the ring finger but no definite acute fracture.  Suspect a remote healed fifth metacarpal fracture.  IMPRESSION:  1.  Osteoporosis and osteoarthritic type degenerative changes. 2.  No acute fracture.   Original Report Authenticated By: Rudie Meyer, M.D.     Scheduled Meds:   . amLODipine  10 mg Oral Daily  . beta carotene w/minerals  1 tablet Oral BID  . [COMPLETED] fentaNYL  50 mcg Intravenous Once  . losartan  100 mg Oral Daily  . metoprolol  50 mg Oral BID  . [COMPLETED] ondansetron  4 mg Intravenous Once  . raloxifene  60 mg Oral Daily  . sodium chloride  3 mL  Intravenous Q12H  . [DISCONTINUED] NiMODipine  60 mg Oral Q4H   Continuous Infusions:   . 0.9 % NaCl with KCl 20 mEq / L 75 mL/hr at 03/31/12 0215  . [DISCONTINUED] sodium chloride 100 mL/hr at 03/30/12 2240    Principal Problem:  *Subarachnoid hemorrhage following injury Active Problems:  Humeral distal fracture  Fall  Hard of hearing  Great toe pain     Brendia Sacks, MD  [facility] Team 6 Pager [phone]. If 8PM-8AM, please contact night-coverage at [email or web address], password [facility] 03/31/2012, 10:14 AM  LOS: 1 day   Time spent: 25 minutes

## 2012-04-01 ENCOUNTER — Other Ambulatory Visit: Payer: Self-pay | Admitting: Orthopedic Surgery

## 2012-04-01 ENCOUNTER — Encounter (HOSPITAL_COMMUNITY)
Admission: EM | Disposition: A | Discharge status: Nursing Home (Includes ICF) | Payer: Self-pay | Source: Home / Self Care | Attending: Internal Medicine

## 2012-04-01 ENCOUNTER — Encounter (HOSPITAL_COMMUNITY): Payer: Self-pay | Admitting: Anesthesiology

## 2012-04-01 ENCOUNTER — Inpatient Hospital Stay (HOSPITAL_COMMUNITY): Payer: Medicare Other | Admitting: Anesthesiology

## 2012-04-01 DIAGNOSIS — S42413A Displaced simple supracondylar fracture without intercondylar fracture of unspecified humerus, initial encounter for closed fracture: Secondary | ICD-10-CM | POA: Diagnosis not present

## 2012-04-01 HISTORY — PX: CLOSED REDUCTION WITH HUMERAL PIN INSERTION: SHX5776

## 2012-04-01 SURGERY — CLOSED REDUCTION, FRACTURE, HUMERUS, WITH PINNING
Anesthesia: General | Site: Elbow | Laterality: Left | Wound class: Clean

## 2012-04-01 MED ORDER — ENOXAPARIN SODIUM 30 MG/0.3ML ~~LOC~~ SOLN
30.0000 mg | Freq: Two times a day (BID) | SUBCUTANEOUS | Status: DC
  Filled 2012-04-01: qty 0.3

## 2012-04-01 MED ORDER — FENTANYL CITRATE 0.05 MG/ML IJ SOLN
INTRAMUSCULAR | Status: DC | PRN
  Administered 2012-04-01 (×5): 50 ug via INTRAVENOUS

## 2012-04-01 MED ORDER — FENTANYL CITRATE 0.05 MG/ML IJ SOLN
25.0000 ug | INTRAMUSCULAR | Status: DC | PRN
  Administered 2012-04-01: 25 ug via INTRAVENOUS

## 2012-04-01 MED ORDER — LACTATED RINGERS IV SOLN
INTRAVENOUS | Status: DC
  Administered 2012-04-01: 1000 mL via INTRAVENOUS

## 2012-04-01 MED ORDER — PROPOFOL 10 MG/ML IV BOLUS
INTRAVENOUS | Status: DC | PRN
  Administered 2012-04-01: 120 mg via INTRAVENOUS

## 2012-04-01 MED ORDER — PROMETHAZINE HCL 25 MG/ML IJ SOLN: 6.2500 mg | INTRAMUSCULAR | Status: DC | PRN

## 2012-04-01 MED ORDER — SUCCINYLCHOLINE CHLORIDE 20 MG/ML IJ SOLN
INTRAMUSCULAR | Status: DC | PRN
  Administered 2012-04-01: 100 mg via INTRAVENOUS

## 2012-04-01 MED ORDER — CEFAZOLIN SODIUM-DEXTROSE 2-3 GM-% IV SOLR
INTRAVENOUS | Status: DC | PRN
  Administered 2012-04-01: 2 g via INTRAVENOUS

## 2012-04-01 MED ORDER — OXYCODONE-ACETAMINOPHEN 5-325 MG PO TABS: 1.0000 | ORAL_TABLET | ORAL | Status: DC | PRN

## 2012-04-01 MED ORDER — LIDOCAINE HCL (CARDIAC) 20 MG/ML IV SOLN
INTRAVENOUS | Status: DC | PRN
  Administered 2012-04-01: 50 mg via INTRAVENOUS

## 2012-04-01 MED ORDER — HEPARIN SODIUM (PORCINE) 5000 UNIT/ML IJ SOLN: 5000.0000 [IU] | Freq: Two times a day (BID) | INTRAMUSCULAR | Status: DC

## 2012-04-01 MED ORDER — BUPIVACAINE HCL (PF) 0.5 % IJ SOLN
INTRAMUSCULAR | Status: DC | PRN
  Administered 2012-04-01: 8 mL

## 2012-04-01 SURGICAL SUPPLY — 35 items
BANDAGE ELASTIC 3 VELCRO ST LF (GAUZE/BANDAGES/DRESSINGS) ×3 IMPLANT
BANDAGE ELASTIC 4 VELCRO ST LF (GAUZE/BANDAGES/DRESSINGS) ×3 IMPLANT
BANDAGE ESMARK 6X9 LF (GAUZE/BANDAGES/DRESSINGS) ×2 IMPLANT
BANDAGE GAUZE ELAST BULKY 4 IN (GAUZE/BANDAGES/DRESSINGS) ×3 IMPLANT
BNDG CMPR 9X4 STRL LF SNTH (GAUZE/BANDAGES/DRESSINGS) ×2
BNDG CMPR 9X6 STRL LF SNTH (GAUZE/BANDAGES/DRESSINGS) ×2
BNDG ESMARK 4X9 LF (GAUZE/BANDAGES/DRESSINGS) ×3 IMPLANT
BNDG ESMARK 6X9 LF (GAUZE/BANDAGES/DRESSINGS) ×3
CANISTER SUCTION 2500CC (MISCELLANEOUS) ×6 IMPLANT
CLOTH BEACON ORANGE TIMEOUT ST (SAFETY) ×3 IMPLANT
CORDS BIPOLAR (ELECTRODE) ×3 IMPLANT
CUFF TOURN SGL QUICK 18 (TOURNIQUET CUFF) ×3 IMPLANT
DRSG PAD ABDOMINAL 8X10 ST (GAUZE/BANDAGES/DRESSINGS) ×3 IMPLANT
ELECT REM PT RETURN 9FT ADLT (ELECTROSURGICAL)
ELECTRODE REM PT RTRN 9FT ADLT (ELECTROSURGICAL) IMPLANT
GAUZE XEROFORM 1X8 LF (GAUZE/BANDAGES/DRESSINGS) ×3 IMPLANT
GLOVE BIOGEL PI IND STRL 8.5 (GLOVE) ×4 IMPLANT
GLOVE BIOGEL PI INDICATOR 8.5 (GLOVE) ×2
GLOVE SURG SS PI 7.5 STRL IVOR (GLOVE) ×3 IMPLANT
GLOVE SURG SS PI 8.0 STRL IVOR (GLOVE) ×3 IMPLANT
GOWN STRL NON-REIN LRG LVL3 (GOWN DISPOSABLE) ×9 IMPLANT
K-WIRE FIXATION 2.0X6 (WIRE) ×12
KIT BASIN OR (CUSTOM PROCEDURE TRAY) ×3 IMPLANT
KWIRE FIXATION 2.0X6 (WIRE) ×8 IMPLANT
PACK LOWER EXTREMITY WL (CUSTOM PROCEDURE TRAY) ×3 IMPLANT
PAD CAST 4YDX4 CTTN HI CHSV (CAST SUPPLIES) ×2 IMPLANT
PADDING CAST ABS 4INX4YD NS (CAST SUPPLIES) ×1
PADDING CAST ABS COTTON 4X4 ST (CAST SUPPLIES) ×2 IMPLANT
PADDING CAST COTTON 4X4 STRL (CAST SUPPLIES) ×3
SPLINT PLASTER CAST XFAST 4X15 (CAST SUPPLIES) ×2 IMPLANT
SPLINT PLASTER XTRA FAST SET 4 (CAST SUPPLIES) ×1
SPONGE GAUZE 4X4 12PLY (GAUZE/BANDAGES/DRESSINGS) ×3 IMPLANT
SUT ETHILON 4 0 PS 2 18 (SUTURE) IMPLANT
SYR CONTROL 10ML LL (SYRINGE) ×3 IMPLANT
TOWEL OR 17X26 10 PK STRL BLUE (TOWEL DISPOSABLE) ×3 IMPLANT

## 2012-04-01 NOTE — Evaluation (Signed)
Occupational Therapy Evaluation Patient Details Name: Melissa Carroll MRN: [id number] DOB: [birth year] Today's Date: 04/01/2012 Time: 9528-4132 OT Time Calculation (min): 15 min  OT Assessment / Plan / Recommendation Clinical Impression  This 78 year old female moved into independent living 3 days pta at [facility].  She fell sustaining a SAH and complex L distal humerus fx.  She is scheduled for surgery later today.  Pt would benefit from skilled OT to maximize independence and safety with adls with min to mod A level goals in acute    OT Assessment  Patient needs continued OT Services    Follow Up Recommendations  SNF    Barriers to Discharge      Equipment Recommendations  None recommended by PT    Recommendations for Other Services    Frequency  Min 2X/week    Precautions / Restrictions Precautions Precautions: Fall Required Braces or Orthoses:  (L humerus fx--splinted.  Used sling for transfer) Restrictions Weight Bearing Restrictions: Yes Other Position/Activity Restrictions: NWB 2* distal humerus fx   Pertinent Vitals/Pain L shoulder hurts:  Not rated    ADL  Eating/Feeding: NPO (due to surgery scheduled) Grooming: Performed;Brushing hair;Teeth care;Minimal assistance (to replace cup, switch for emesis basin) Where Assessed - Grooming: Supported sitting Upper Body Bathing: Simulated;Moderate assistance Where Assessed - Upper Body Bathing: Unsupported sitting Lower Body Bathing: Simulated;Maximal assistance Where Assessed - Lower Body Bathing: Supported sit to stand Upper Body Dressing: Simulated;Maximal assistance Where Assessed - Upper Body Dressing: Unsupported sitting Lower Body Dressing: Simulated;+1 Total assistance Where Assessed - Lower Body Dressing: Supported sit to stand Toilet Transfer: Mining engineer Method: Surveyor, minerals:  (bed to chair) Toileting - Clothing Manipulation and Hygiene:  Simulated;Moderate assistance Where Assessed - Toileting Clothing Manipulation and Hygiene: Sit to stand from 3-in-1 or toilet Equipment Used:  (sling; hand held assist) Transfers/Ambulation Related to ADLs: bed mob mod A.  SPT min A ADL Comments: limited by decreased use of LUE due to fx.  Pt can perform grooming: R shoulder also limited with ROM    OT Diagnosis: Generalized weakness  OT Problem List: Decreased strength;Decreased activity tolerance;Impaired balance (sitting and/or standing);Decreased knowledge of use of DME or AE;Impaired UE functional use;Pain OT Treatment Interventions: Self-care/ADL training;DME and/or AE instruction;Therapeutic activities;Patient/family education;Balance training   OT Goals Acute Rehab OT Goals OT Goal Formulation: With patient Time For Goal Achievement: 04/08/12 Potential to Achieve Goals: Good ADL Goals Pt Will Perform Upper Body Bathing: with min assist;Sitting, chair;Supported ADL Goal: Product manager - Progress: Goal set today Pt Will Perform Lower Body Bathing: with mod assist;Sit to stand from chair;with adaptive equipment ADL Goal: Lower Body Bathing - Progress: Goal set today Pt Will Perform Lower Body Dressing: with min assist;Sit to stand from chair;with adaptive equipment (pants only with reacher) ADL Goal: Lower Body Dressing - Progress: Goal set today Pt Will Transfer to Toilet: with min assist;3-in-1;Ambulation ADL Goal: Toilet Transfer - Progress: Goal set today Pt Will Perform Toileting - Hygiene: with min assist;Sit to stand from 3-in-1/toilet ADL Goal: Toileting - Hygiene - Progress: Goal set today Arm Goals Additional Arm Goal #1: pt will be independent with edema management Arm Goal: Additional Goal #1 - Progress: Goal set today Miscellaneous OT Goals Miscellaneous OT Goal #1: pt will be independent directing caregiver in donning splint and sequence for donning/doffing shirt OT Goal: Miscellaneous Goal #1 - Progress: Goal  set today  Visit Information  Last OT Received On: 04/01/12 Assistance Needed: +1  PT/OT Co-Evaluation/Treatment: Yes (overlapped)    Subjective Data  Subjective: The only thing that hurts is this arm L--it's better without a pillow under it:  we tried it Patient Stated Goal: pt agreeable to PT/OT.  Wanted to get OOB   Prior Functioning     Home Living Available Help at Discharge: Skilled Nursing Facility Type of Home: Independent living facility (Pt had just moved into [facility] 3 days prior to fall) Home Layout: One level Home Adaptive Equipment: Environmental consultant - rolling;Straight cane Additional Comments: has had several falls but others had no injury Prior Function Level of Independence: Independent with assistive device(s) Communication Communication: HOH Dominant Hand: Right         Vision/Perception     Cognition  Overall Cognitive Status: Appears within functional limits for tasks assessed/performed Arousal/Alertness: Awake/alert Orientation Level: Appears intact for tasks assessed Behavior During Session: [facility] for tasks performed    Extremity/Trunk Assessment Right Upper Extremity Assessment RUE ROM/Strength/Tone: Deficits RUE ROM/Strength/Tone Deficits: shoulder elevation AROM approx 80* Left Upper Extremity Assessment LUE ROM/Strength/Tone: Unable to fully assess;Due to pain;Due to precautions (pt can move fingers; elbow splinted:  distal humerus fx) Trunk Assessment Trunk Assessment: Kyphotic     Mobility Bed Mobility Bed Mobility: Supine to Sit Supine to Sit: 3: Mod assist;HOB elevated Details for Bed Mobility Assistance: mod A to elevate trunk and scoot hips to EOB Transfers Sit to Stand: 4: Min assist Stand to Sit: 4: Min assist Details for Transfer Assistance: LUE supported in sling; min A for balance     Shoulder Instructions     Exercise     Balance Balance Balance Assessed: Yes Static Sitting Balance Static Sitting - Balance Support: Right  upper extremity supported;Feet supported Static Sitting - Level of Assistance: 5: Stand by assistance Static Sitting - Comment/# of Minutes: 3   End of Session OT - End of Session Activity Tolerance: Patient tolerated treatment well Patient left: in chair;with call bell/phone within reach Nurse Communication: Mobility status (sling donned for transfer;don't move arm)  GO     Lathan Gieselman 04/01/2012, 12:23 PM Marica Otter, OTR/L [phone] 04/01/2012

## 2012-04-01 NOTE — Anesthesia Postprocedure Evaluation (Signed)
  Anesthesia Post-op Note  Patient: Melissa Carroll  Procedure(s) Performed: Procedure(s) (LRB): CLOSED REDUCTION WITH HUMERAL PIN INSERTION (Left)  Patient Location: PACU  Anesthesia Type: General  Level of Consciousness: awake and alert   Airway and Oxygen Therapy: Patient Spontanous Breathing  Post-op Pain: mild  Post-op Assessment: Post-op Vital signs reviewed, Patient's Cardiovascular Status Stable, Respiratory Function Stable, Patent Airway and No signs of Nausea or vomiting  Last Vitals:  Filed Vitals:   04/01/12 2030  BP:   Pulse: 69  Temp: 37 C  Resp: 14    Post-op Vital Signs: stable   Complications: No apparent anesthesia complications

## 2012-04-01 NOTE — Transfer of Care (Signed)
Immediate Anesthesia Transfer of Care Note  Patient: Melissa Carroll  Procedure(s) Performed: Procedure(s) (LRB) with comments: CLOSED REDUCTION WITH HUMERAL PIN INSERTION (Left)  Patient Location: PACU  Anesthesia Type:General  Level of Consciousness: awake, alert  and oriented  Airway & Oxygen Therapy: Patient Spontanous Breathing and Patient connected to face mask oxygen  Post-op Assessment: Report given to PACU RN and Post -op Vital signs reviewed and stable  Post vital signs: Reviewed and stable  Complications: No apparent anesthesia complications

## 2012-04-01 NOTE — Progress Notes (Signed)
[facility] PROGRESS NOTE  Melissa Carroll ZOX:[id number] DOB: [birth year] DOA: 03/30/2012 PCP: Ginette Otto, MD  Assessment/Plan: S/p multiple mechanical falls prior to admission Continue PT, likely will need rehab.  Complex displaced supracondylar fracture of the distal humerus Per orthopedics, plan for surgery today.  Right toe pain No fractures on imaging.  Traumatic SAH Asymptomatic. Repeat head CT on 04/01/2012 no SAH seen. Dr. Irene Limbo, discussed with Dr. Terrilee Files neurosurgery on 03/31/2012, he recommended no further evaluation, discontinue nimodipine. Patient may proceed with any orthopedic surgery immediately as deemed necessary by orthopedics. No indication to transfer to [facility].  Code Status: DNR Family Communication: Discussed with daughter at bedside Disposition Plan: Pending further treatment  Brief narrative: 89-old-woman with history of 2 mechanical falls prior to admission found to have small SAH and elbow fracture.  Consultants:  Orthopedics   Procedures:  As above.  HPI/Subjective: No specific concerns, had breakfast this morning.  Plans to have surgery for her arm later today.  Objective: Filed Vitals:   03/31/12 0520 03/31/12 1335 03/31/12 2200 04/01/12 0600  BP: 148/76 134/70 141/57 119/56  Pulse: 65 72 82 60  Temp: 98.2 F (36.8 C) 98.4 F (36.9 C) 98.2 F (36.8 C) 98.1 F (36.7 C)  TempSrc: Oral Oral Oral   Resp: 18 20 20 18   Height:      Weight:      SpO2: 91% 94% 94% 92%    Intake/Output Summary (Last 24 hours) at 04/01/12 1009 Last data filed at 03/31/12 2200  Gross per 24 hour  Intake    240 ml  Output      0 ml  Net    240 ml   Filed Weights   03/31/12 0140  Weight: 64.4 kg (141 lb 15.6 oz)    Exam: Physical Exam: General: Awake, Oriented, No acute distress. HEENT: EOMI. Bruising over right for head with bandage. Neck: Supple CV: S1 and S2 Lungs: Clear to ascultation bilaterally Abdomen: Soft, Nontender,  Nondistended, +bowel sounds. Ext: Good pulses. Trace edema. LUE wrapped in bandages. Right toe significant bruising with some ecchymosis over the dorsal foot.  Right lower extremity feels warm.  Data Reviewed: Basic Metabolic Panel:  Lab 03/31/12 8119 03/30/12 2225  NA 139 137  K 3.5 3.1*  CL 104 101  CO2 24 24  GLUCOSE 115* 178*  BUN 17 23  CREATININE 0.53 0.60  CALCIUM 8.3* 9.0  MG -- --  PHOS -- --   Liver Function Tests:  Lab 03/31/12 0515  AST 29  ALT 20  ALKPHOS 38*  BILITOT 1.0  PROT 5.5*  ALBUMIN 3.0*   CBC:  Lab 03/31/12 0515 03/30/12 2225  WBC 9.0 9.6  NEUTROABS -- 6.5  HGB 11.6* 12.6  HCT 33.4* 36.6  MCV 95.7 95.6  PLT 134* 147*    Studies: Dg Elbow 2 Views Left  03/30/2012  *RADIOLOGY REPORT*  Clinical Data: Larey Seat.  Left elbow pain.  LEFT ELBOW - 2 VIEW  Comparison: None  Findings: There is a comminuted supracondylar elbow fracture.  The radius and ulna appear intact.  IMPRESSION: Complex displaced supracondylar fracture of the distal humerus.   Original Report Authenticated By: Rudie Meyer, M.D.    Ct Head Wo Contrast  03/31/2012  *RADIOLOGY REPORT*  Clinical Data: Fall.  Intracranial hemorrhage.  Follow-up.  CT HEAD WITHOUT CONTRAST  Technique:  Contiguous axial images were obtained from the base of the skull through the vertex without contrast.  Comparison: 03/30/2012.  Findings: The previously noted  tiny amount of subarachnoid hemorrhage seen in the right convexity is not apparent on the present examination.  No new intracranial hemorrhage noted.  No skull fracture.  Small vessel disease type changes.  Remote infarct left cerebellum.  Global atrophy without hydrocephalus.  No CT evidence of large acute infarct.  Right frontal calvarial 1 cm bony lesion may be an exostosis or meningioma. No associated mass effect.  IMPRESSION: The previously noted tiny amount of subarachnoid hemorrhage seen in the right convexity is not apparent on the present examination.   No new intracranial hemorrhage noted.   Original Report Authenticated By: Lacy Duverney, M.D.    Ct Head Wo Contrast  03/30/2012  *RADIOLOGY REPORT*  Clinical Data:  Head and neck pain post fall  CT HEAD WITHOUT CONTRAST CT CERVICAL SPINE WITHOUT CONTRAST  Technique:  Multidetector CT imaging of the head and cervical spine was performed following the standard protocol without intravenous contrast.  Multiplanar CT image reconstructions of the cervical spine were also generated.  Comparison:  None  CT HEAD  Findings: Generalized atrophy. Normal ventricular morphology. No midline shift or mass effect. Small vessel chronic ischemic changes of deep cerebral white matter. At the right vertex, a small amount of high attenuation acute subarachnoid blood is identified. No additional intracranial hemorrhage, mass lesion, or acute infarction. 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 Visualized paranasal sinuses and mastoid air cells clear. Small sclerotic focus identified at the right frontal bone with a contiguous question exostosis arising from the inner table of the calvarium, unchanged from 2011 radiographs.  IMPRESSION: Atrophy with small vessel chronic ischemic changes of deep cerebral white matter. Small amount of high attenuation subarachnoid hemorrhage is seen at the right vertex. 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 No additional acute intracranial abnormalities.  CT CERVICAL SPINE  Findings: Scattered atherosclerotic calcifications within the carotid systems. Biapical lung scarring. Visualized skull base intact. Disc space narrowing with endplate spur formation at C4-C5, C5-C6, C6-C7. Vertebral body heights maintained without fracture or subluxation. Minimal scattered facet degenerative changes. Prevertebral soft tissues normal thickness. Osseous demineralization.  IMPRESSION:  Degenerative disc disease changes cervical spine at C4-C5 through C6-C7. No acute cervical spine abnormalities identified.   Critical Value/emergent results were called by telephone at the time of interpretation on 03/30/2012 at 2014 hours to Dr. Radford Pax, who verbally acknowledged these results.   Original Report Authenticated By: Ulyses Southward, M.D.    Ct Cervical Spine Wo Contrast  03/30/2012  *RADIOLOGY REPORT*  Clinical Data:  Head and neck pain post fall  CT HEAD WITHOUT CONTRAST CT CERVICAL SPINE WITHOUT CONTRAST  Technique:  Multidetector CT imaging of the head and cervical spine was performed following the standard protocol without intravenous contrast.  Multiplanar CT image reconstructions of the cervical spine were also generated.  Comparison:  None  CT HEAD  Findings: Generalized atrophy. Normal ventricular morphology. No midline shift or mass effect. Small vessel chronic ischemic changes of deep cerebral white matter. At the right vertex, a small amount of high attenuation acute subarachnoid blood is identified. No additional intracranial hemorrhage, mass lesion, or acute infarction. 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 Visualized paranasal sinuses and mastoid air cells clear. Small sclerotic focus identified at the right frontal bone with a contiguous question exostosis arising from the inner table of the calvarium, unchanged from 2011 radiographs.  IMPRESSION: Atrophy with small vessel chronic ischemic changes of deep cerebral white matter. Small amount of high attenuation subarachnoid hemorrhage is seen at the right vertex. Small old left cerebellar  infarcts. No additional acute intracranial abnormalities.  CT CERVICAL SPINE  Findings: Scattered atherosclerotic calcifications within the carotid systems. Biapical lung scarring. Visualized skull base intact. Disc space narrowing with endplate spur formation at C4-C5, C5-C6, C6-C7. Vertebral body heights maintained without fracture or subluxation. Minimal scattered facet degenerative changes. Prevertebral soft tissues normal thickness. Osseous demineralization.   IMPRESSION:  Degenerative disc disease changes cervical spine at C4-C5 through C6-C7. No acute cervical spine abnormalities identified.  Critical Value/emergent results were called by telephone at the time of interpretation on 03/30/2012 at 2014 hours to Dr. Radford Pax, who verbally acknowledged these results.   Original Report Authenticated By: Ulyses Southward, M.D.    Dg Chest Portable 1 View  03/30/2012  *RADIOLOGY REPORT*  Clinical Data: Elbow fracture, fall  PORTABLE CHEST - 1 VIEW  Comparison: Portable exam 2230 hours without priors for Romeo Apple  Findings: Upper normal heart size. Calcified tortuous aorta. Pulmonary vascularity normal. Slight rotation to the left. Lungs appear emphysematous with biapical scarring. No acute infiltrate, pleural effusion, or pneumothorax. Probable bilateral chronic rotator cuff tears. Osseous demineralization.  IMPRESSION: Emphysematous changes with biapical scarring. No acute abnormalities.   Original Report Authenticated By: Ulyses Southward, M.D.    Dg Hand Complete Left  03/30/2012  *RADIOLOGY REPORT*  Clinical Data: Larey Seat.  Injured ring finger.  LEFT HAND - COMPLETE 3+ VIEW  Comparison: None  Findings: There are moderate osteoarthritic type degenerative changes involving the DIP and PIP joints of the fingers.  Moderate osteoporosis is also noted.  There is soft tissue swelling involving the ring finger but no definite acute fracture.  Suspect a remote healed fifth metacarpal fracture.  IMPRESSION:  1.  Osteoporosis and osteoarthritic type degenerative changes. 2.  No acute fracture.   Original Report Authenticated By: Rudie Meyer, M.D.    Dg Toe Great Right  03/31/2012  *RADIOLOGY REPORT*  Clinical Data: Fall, right toe pain  RIGHT GREAT TOE  Comparison: None.  Findings: No fracture or dislocation is seen.  Mild irregularity involving the base of the first distal phalanx on the oblique view is favored to be degenerative, given lack of corresponding abnormality on the frontal  view.  Mild degenerative changes of the MTP and IP joint.  Possible mild diffuse soft tissue swelling.  IMPRESSION: No fracture or dislocation is seen.   Original Report Authenticated By: Charline Bills, M.D.     Scheduled Meds:    . amLODipine  10 mg Oral Daily  . beta carotene w/minerals  1 tablet Oral BID  . losartan  100 mg Oral Daily  . metoprolol  50 mg Oral BID  . raloxifene  60 mg Oral Daily  . sodium chloride  3 mL Intravenous Q12H   Continuous Infusions:    . [DISCONTINUED] 0.9 % NaCl with KCl 20 mEq / L 75 mL/hr at 03/31/12 0215    Principal Problem:  *Subarachnoid hemorrhage following injury Active Problems:  Humeral distal fracture  Fall  Hard of hearing  Great toe pain     Shiane Wenberg A, MD  [facility] Team 5 Pager [phone]. If 8PM-8AM, please contact night-coverage at [email or web address], password [facility] 04/01/2012, 10:09 AM  LOS: 2 days

## 2012-04-01 NOTE — Anesthesia Preprocedure Evaluation (Addendum)
Anesthesia Evaluation  Patient identified by MRN, date of birth, ID band Patient awake    Reviewed: Allergy & Precautions, H&P , NPO status , Patient's Chart, lab work & pertinent test results  Airway Mallampati: II TM Distance: >3 FB Neck ROM: Full    Dental No notable dental hx.    Pulmonary neg pulmonary ROS,  breath sounds clear to auscultation  Pulmonary exam normal       Cardiovascular hypertension, Pt. on medications and Pt. on home beta blockers Rhythm:Regular Rate:Normal     Neuro/Psych Question of tiny subarachnoid hemorrhage.Clearance neurosurgery (Dr. Lovell Sheehan) negative psych ROS   GI/Hepatic negative GI ROS, Neg liver ROS,   Endo/Other  negative endocrine ROS  Renal/GU negative Renal ROS  negative genitourinary   Musculoskeletal negative musculoskeletal ROS (+)   Abdominal   Peds negative pediatric ROS (+)  Hematology negative hematology ROS (+)   Anesthesia Other Findings   Reproductive/Obstetrics negative OB ROS                        Anesthesia Physical Anesthesia Plan  ASA: III  Anesthesia Plan: General   Post-op Pain Management:    Induction: Intravenous  Airway Management Planned: Oral ETT  Additional Equipment:   Intra-op Plan:   Post-operative Plan: Extubation in OR  Informed Consent: I have reviewed the patients History and Physical, chart, labs and discussed the procedure including the risks, benefits and alternatives for the proposed anesthesia with the patient or authorized representative who has indicated his/her understanding and acceptance.   Dental advisory given  Plan Discussed with: CRNA  Anesthesia Plan Comments:        Anesthesia Quick Evaluation

## 2012-04-01 NOTE — Progress Notes (Signed)
Clinical Social Work Department BRIEF PSYCHOSOCIAL ASSESSMENT 04/01/2012  Patient:  JAYDE, MCALLISTER     Account Number:  [id number]     Admit date:  03/30/2012  Clinical Social Worker:  Hattie Perch  Date/Time:  04/01/2012 12:00 M  Referred by:  Physician  Date Referred:  04/01/2012 Referred for  SNF Placement   Other Referral:   Interview type:  Patient Other interview type:   daughter at bedside    PSYCHOSOCIAL DATA Living Status:  FACILITY Admitted from facility:  [facility] Level of care:  Independent Living Primary support name:  marilyn Pipkins Primary support relationship to patient:  CHILD, ADULT Degree of support available:   good    CURRENT CONCERNS Current Concerns  Post-Acute Placement   Other Concerns:    SOCIAL WORK ASSESSMENT / PLAN CSW met with patient and patient's daughter at bedside. patient is alert and oriented X3. patient is from independent living at [facility] and will need SNF upon discharge. agreeable to snf at [facility] upon discharge.   Assessment/plan status:   Other assessment/ plan:   Information/referral to community resources:    PATIENT'S/FAMILY'S RESPONSE TO PLAN OF CARE: agreeable to [facility] upon discharge.

## 2012-04-01 NOTE — Brief Op Note (Signed)
04/01/2012  7:37 PM  PATIENT:  Melissa Carroll  78 y.o. female  PRE-OPERATIVE DIAGNOSIS:  fractured left distal humerus  POST-OPERATIVE DIAGNOSIS:  fractured left distal humerus  PROCEDURE:  Procedure(s) (LRB) with comments: CLOSED REDUCTION WITH HUMERAL PIN INSERTION (Left)  SURGEON:  Surgeon(s) and Role:    * Marlowe Shores, MD - Primary    * Tami Ribas, MD - Assisting    * Tami Ribas, MD  PHYSICIAN ASSISTANT:   ASSISTANTS: Betha Loa  ANESTHESIA:   general  EBL:     BLOOD ADMINISTERED:none  DRAINS: none   LOCAL MEDICATIONS USED:  NONE  SPECIMEN:  No Specimen  DISPOSITION OF SPECIMEN:  N/A  COUNTS:  YES  TOURNIQUET:   Total Tourniquet Time Documented: Upper Arm (Left) - 22 minutes  DICTATION: .Other Dictation: Dictation Number [id number]  PLAN OF CARE: already inpatient  PATIENT DISPOSITION:  PACU - hemodynamically stable.   Delay start of Pharmacological VTE agent (>24hrs) due to surgical blood loss or risk of bleeding: yes

## 2012-04-01 NOTE — Evaluation (Signed)
Physical Therapy Evaluation Patient Details Name: Melissa Carroll MRN: [id number] DOB: December 21, 1916 Today's Date: 04/01/2012 Time: 2130-8657 PT Time Calculation (min): 18 min  PT Assessment / Plan / Recommendation Clinical Impression  78 y.o. female admitted with fall and L distal humerus fx, planning to have surgery this afternoon. Bed to chair transfer with min A. Pt plans to return to [facility], skilled unit recommended. Pt would benefit from acute PT to maximize safety and independence with mobility.     PT Assessment  Patient needs continued PT services    Follow Up Recommendations  SNF;Supervision/Assistance - 24 hour    Does the patient have the potential to tolerate intense rehabilitation      Barriers to Discharge None      Equipment Recommendations  None recommended by PT    Recommendations for Other Services OT consult   Frequency Min 3X/week    Precautions / Restrictions Precautions Precautions: Fall Restrictions Weight Bearing Restrictions: Yes Other Position/Activity Restrictions: NWB 2* distal humerus fx   Pertinent Vitals/Pain **Pain LUE is "not more than a 3/10" LUE supported in sling, pillow underneath Pt premedicated prior to PT/OT*      Mobility  Bed Mobility Bed Mobility: Supine to Sit Supine to Sit: 3: Mod assist;HOB elevated Details for Bed Mobility Assistance: mod A to elevate trunk and scoot hips to EOB Transfers Transfers: Sit to Stand;Stand Pivot Transfers;Stand to Sit Sit to Stand: 4: Min assist Stand to Sit: 4: Min Multimedia programmer Transfers: 4: Min assist Details for Transfer Assistance: LUE supported in sling; min A for balance    Shoulder Instructions     Exercises     PT Diagnosis: Difficulty walking;Acute pain  PT Problem List: Pain;Decreased mobility;Decreased activity tolerance PT Treatment Interventions: Gait training;Functional mobility training;Therapeutic activities;Patient/family education   PT Goals Acute Rehab  PT Goals PT Goal Formulation: With patient/family Time For Goal Achievement: 04/08/12 Potential to Achieve Goals: Good Pt will go Supine/Side to Sit: with supervision PT Goal: Supine/Side to Sit - Progress: Goal set today Pt will go Sit to Stand: with supervision PT Goal: Sit to Stand - Progress: Goal set today Pt will Transfer Bed to Chair/Chair to Bed: with supervision PT Transfer Goal: Bed to Chair/Chair to Bed - Progress: Goal set today Pt will Ambulate: 16 - 50 feet;with cane;with min assist PT Goal: Ambulate - Progress: Goal set today  Visit Information  Last PT Received On: 04/01/12 Assistance Needed: +1 PT/OT Co-Evaluation/Treatment: Yes    Subjective Data  Subjective: My arm just hurts when I move it.  Patient Stated Goal: return to [facility]   Prior Functioning  Home Living Available Help at Discharge: Skilled Nursing Facility Type of Home: Independent living facility (Pt had just moved into [facility] 3 days prior to fall) Home Layout: One level Home Adaptive Equipment: Environmental consultant - rolling;Straight cane Prior Function Level of Independence: Independent with assistive device(s) (used RW and SPC) Communication Communication: HOH    Cognition  Overall Cognitive Status: Appears within functional limits for tasks assessed/performed Arousal/Alertness: Awake/alert Orientation Level: Appears intact for tasks assessed Behavior During Session: [facility] for tasks performed    Extremity/Trunk Assessment Right Upper Extremity Assessment RUE ROM/Strength/Tone: Deficits RUE ROM/Strength/Tone Deficits: shoulder elevation AROM approx 80* Left Upper Extremity Assessment LUE ROM/Strength/Tone: Unable to fully assess;Due to pain;Due to precautions (distal humerus fx, surgery planned for this afternoon) Right Lower Extremity Assessment RLE ROM/Strength/Tone: Within functional levels RLE Sensation: WFL - Light Touch RLE Coordination: WFL - gross/fine motor  Left Lower Extremity  Assessment LLE ROM/Strength/Tone: Within functional levels LLE Sensation: WFL - Light Touch LLE Coordination: WFL - gross/fine motor Trunk Assessment Trunk Assessment: Kyphotic   Balance Balance Balance Assessed: Yes Static Sitting Balance Static Sitting - Balance Support: Right upper extremity supported;Feet supported Static Sitting - Level of Assistance: 5: Stand by assistance Static Sitting - Comment/# of Minutes: 3  End of Session PT - End of Session Equipment Utilized During Treatment:  (LUE sling) Activity Tolerance: Patient tolerated treatment well Patient left: in chair;with call bell/phone within reach;with family/visitor present Nurse Communication: Mobility status  GP     Ralene Bathe Kistler 04/01/2012, 11:32 AM  [phone]

## 2012-04-01 NOTE — Progress Notes (Signed)
Clinical Social Work Department CLINICAL SOCIAL WORK PLACEMENT NOTE 04/01/2012  Patient:  Melissa Carroll, Melissa Carroll  Account Number:  [id number] Admit date:  03/30/2012  Clinical Social Worker:  Becky Sax, LCSW  Date/time:  04/01/2012 12:00 M  Clinical Social Work is seeking post-discharge placement for this patient at the following level of care:   SKILLED NURSING   (*CSW will update this form in Epic as items are completed)   04/01/2012  Patient/family provided with [facility] Department of Clinical Social Work's list of facilities offering this level of care within the geographic area requested by the patient (or if unable, by the patient's family).  04/01/2012  Patient/family informed of their freedom to choose among providers that offer the needed level of care, that participate in Medicare, Medicaid or managed care program needed by the patient, have an available bed and are willing to accept the patient.  04/01/2012  Patient/family informed of [facility]' ownership interest in [facility], as well as of the fact that they are under no obligation to receive care at this facility.  PASARR submitted to EDS on 04/01/2012 PASARR number received from EDS on 04/01/2012  FL2 transmitted to all facilities in geographic area requested by pt/family on  04/01/2012 FL2 transmitted to all facilities within larger geographic area on   Patient informed that his/her managed care company has contracts with or will negotiate with  certain facilities, including the following:     Patient/family informed of bed offers received:   Patient chooses bed at  Physician recommends and patient chooses bed at    Patient to be transferred to  on   Patient to be transferred to facility by   The following physician request were entered in Epic:   Additional Comments:

## 2012-04-01 NOTE — Op Note (Signed)
See note [id number]

## 2012-04-02 DIAGNOSIS — R488 Other symbolic dysfunctions: Secondary | ICD-10-CM | POA: Diagnosis not present

## 2012-04-02 DIAGNOSIS — G4700 Insomnia, unspecified: Secondary | ICD-10-CM | POA: Diagnosis not present

## 2012-04-02 DIAGNOSIS — S42409A Unspecified fracture of lower end of unspecified humerus, initial encounter for closed fracture: Secondary | ICD-10-CM | POA: Diagnosis not present

## 2012-04-02 DIAGNOSIS — H35059 Retinal neovascularization, unspecified, unspecified eye: Secondary | ICD-10-CM | POA: Diagnosis not present

## 2012-04-02 DIAGNOSIS — I10 Essential (primary) hypertension: Secondary | ICD-10-CM | POA: Diagnosis not present

## 2012-04-02 DIAGNOSIS — Z9181 History of falling: Secondary | ICD-10-CM | POA: Diagnosis not present

## 2012-04-02 DIAGNOSIS — E039 Hypothyroidism, unspecified: Secondary | ICD-10-CM | POA: Diagnosis not present

## 2012-04-02 DIAGNOSIS — M810 Age-related osteoporosis without current pathological fracture: Secondary | ICD-10-CM | POA: Diagnosis not present

## 2012-04-02 DIAGNOSIS — H3530 Unspecified macular degeneration: Secondary | ICD-10-CM | POA: Diagnosis not present

## 2012-04-02 DIAGNOSIS — E559 Vitamin D deficiency, unspecified: Secondary | ICD-10-CM | POA: Diagnosis not present

## 2012-04-02 DIAGNOSIS — W19XXXA Unspecified fall, initial encounter: Secondary | ICD-10-CM

## 2012-04-02 DIAGNOSIS — S066X9A Traumatic subarachnoid hemorrhage with loss of consciousness of unspecified duration, initial encounter: Secondary | ICD-10-CM | POA: Diagnosis not present

## 2012-04-02 DIAGNOSIS — R269 Unspecified abnormalities of gait and mobility: Secondary | ICD-10-CM | POA: Diagnosis not present

## 2012-04-02 DIAGNOSIS — S42413A Displaced simple supracondylar fracture without intercondylar fracture of unspecified humerus, initial encounter for closed fracture: Secondary | ICD-10-CM | POA: Diagnosis not present

## 2012-04-02 DIAGNOSIS — S9030XA Contusion of unspecified foot, initial encounter: Secondary | ICD-10-CM | POA: Diagnosis not present

## 2012-04-02 DIAGNOSIS — M25529 Pain in unspecified elbow: Secondary | ICD-10-CM | POA: Diagnosis not present

## 2012-04-02 DIAGNOSIS — S42209A Unspecified fracture of upper end of unspecified humerus, initial encounter for closed fracture: Secondary | ICD-10-CM | POA: Diagnosis not present

## 2012-04-02 MED ORDER — ONDANSETRON HCL 4 MG PO TABS: 4.0000 mg | ORAL_TABLET | Freq: Four times a day (QID) | ORAL | Status: DC | PRN

## 2012-04-02 MED ORDER — OXYCODONE-ACETAMINOPHEN 5-325 MG PO TABS: 0.5000 | ORAL_TABLET | Freq: Four times a day (QID) | ORAL | Status: DC | PRN

## 2012-04-02 NOTE — Progress Notes (Signed)
Physical Therapy Treatment Patient Details Name: Melissa Carroll MRN: [id number] DOB: [birth year] Today's Date: 04/02/2012 Time: 4098-1191 PT Time Calculation (min): 23 min  PT Assessment / Plan / Recommendation Comments on Treatment Session  Pt tolerated session well. Still unsteady. Plan is to continue rehab at [facility].     Follow Up Recommendations  SNF     Does the patient have the potential to tolerate intense rehabilitation     Barriers to Discharge        Equipment Recommendations  None recommended by PT;None recommended by OT    Recommendations for Other Services    Frequency Min 3X/week   Plan Discharge plan remains appropriate    Precautions / Restrictions Precautions Precautions: Fall Precaution Comments: sling L UE Required Braces or Orthoses:  (sling) Restrictions Weight Bearing Restrictions: Yes LUE Weight Bearing: Non weight bearing Other Position/Activity Restrictions: NWB 2* distal humerus fx LUE   Pertinent Vitals/Pain L UE-unrated    Mobility  Bed Mobility Supine to Sit: 4: Min assist Transfers Transfers: Sit to Stand;Stand to Sit Sit to Stand: 4: Min assist;From bed;From toilet Stand to Sit: 4: Min assist;To toilet;To chair/3-in-1;With armrests Details for Transfer Assistance: Multimodal cues for safety, technique, hand placement. Assist to rise, stabilize, control descent.  Ambulation/Gait Ambulation/Gait Assistance: 4: Min assist Ambulation Distance (Feet): 60 Feet Assistive device: Small based quad cane Ambulation/Gait Assistance Details: VCS safety/safe use of quad cane, pacing. Assist to stabilize throughout ambulation. Fatigues fairly easily.  Gait Pattern: Step-to pattern;Step-through pattern;Decreased stride length    Exercises     PT Diagnosis:    PT Problem List:   PT Treatment Interventions:     PT Goals Acute Rehab PT Goals Pt will go Sit to Stand: with supervision PT Goal: Sit to Stand - Progress: Progressing toward  goal Pt will Ambulate: 51 - 150 feet;with supervision;with cane PT Goal: Ambulate - Progress: Progressing toward goal  Visit Information  Last PT Received On: 04/02/12 Assistance Needed: +1    Subjective Data  Subjective: "this arm is the main thing..." Patient Stated Goal: [facility]   Cognition  Overall Cognitive Status: Appears within functional limits for tasks assessed/performed Arousal/Alertness: Awake/alert Orientation Level: Appears intact for tasks assessed Behavior During Session: [facility] for tasks performed    Balance  Static Sitting Balance Static Sitting - Balance Support: Right upper extremity supported;Feet supported Static Sitting - Level of Assistance: 5: Stand by assistance Dynamic Standing Balance Dynamic Standing - Balance Support: Right upper extremity supported Dynamic Standing - Level of Assistance: 4: Min assist  End of Session PT - End of Session Equipment Utilized During Treatment: Gait belt;Other (comment) (L UE sling) Activity Tolerance: Patient tolerated treatment well Patient left: in chair;with call bell/phone within reach;with family/visitor present   GP     Rebeca Alert [facility] 04/02/2012, 11:54 AM [phone]

## 2012-04-02 NOTE — Progress Notes (Signed)
[facility] PROGRESS NOTE  Melissa Carroll ZOX:[id number] DOB: May 20, 1916 DOA: 03/30/2012 PCP: Ginette Otto, MD  Assessment/Plan: S/p multiple mechanical falls prior to admission Continue PT, likely will need rehab.  Complex displaced supracondylar fracture of the distal humerus Dr. Mina Marble evaluated the patient and performed closed reduction with humeral pin insertion. Per Dr. Mina Marble patient will benefit from a platform walker. Patient to bear weight on left upper extremity/elbow as tolerated. Patient to followup with Dr. Mina Marble on 04/07/2012.  Right toe pain No fractures on imaging.  Traumatic SAH Asymptomatic. Repeat head CT on 04/01/2012 no SAH seen. Dr. Irene Limbo, discussed with Dr. Terrilee Files neurosurgery on 03/31/2012, he recommended no further evaluation, discontinue nimodipine. Patient may proceed with any orthopedic surgery immediately as deemed necessary by orthopedics. No indication to transfer to [facility].  Code Status: DNR Family Communication: Discussed with daughter at bedside Disposition Plan: Discharge the patient today to SNF.  Brief narrative: 89-old-woman with history of 2 mechanical falls prior to admission found to have small SAH and elbow fracture.  Consultants:  Orthopedics, Dr. Mina Marble.  Procedures:  As above.  HPI/Subjective: Had surgery by ortho yesterday.  Objective: Filed Vitals:   04/02/12 0012 04/02/12 0100 04/02/12 0200 04/02/12 0600  BP: 134/49 138/73 133/63 130/56  Pulse: 68 65 62 60  Temp: 98.6 F (37 C) 98 F (36.7 C) 98.5 F (36.9 C) 97.7 F (36.5 C)  TempSrc: Oral Oral Oral Oral  Resp: 18 18 18 18   Height:      Weight:      SpO2: 97% 94% 96% 99%    Intake/Output Summary (Last 24 hours) at 04/02/12 1111 Last data filed at 04/01/12 1942  Gross per 24 hour  Intake    300 ml  Output      0 ml  Net    300 ml   Filed Weights   03/31/12 0140  Weight: 64.4 kg (141 lb 15.6 oz)    Exam: Physical Exam: General:  Awake, Oriented, No acute distress. HEENT: EOMI. Bruising over right for head with bandage. Neck: Supple CV: S1 and S2 Lungs: Clear to ascultation bilaterally Abdomen: Soft, Nontender, Nondistended, +bowel sounds. Ext: Good pulses. Trace edema. LUE wrapped in bandages. Right toe significant bruising with some ecchymosis over the dorsal foot, stable.  Right lower extremity feels warm.  Data Reviewed: Basic Metabolic Panel:  Lab 03/31/12 8119 03/30/12 2225  NA 139 137  K 3.5 3.1*  CL 104 101  CO2 24 24  GLUCOSE 115* 178*  BUN 17 23  CREATININE 0.53 0.60  CALCIUM 8.3* 9.0  MG -- --  PHOS -- --   Liver Function Tests:  Lab 03/31/12 0515  AST 29  ALT 20  ALKPHOS 38*  BILITOT 1.0  PROT 5.5*  ALBUMIN 3.0*   CBC:  Lab 03/31/12 0515 03/30/12 2225  WBC 9.0 9.6  NEUTROABS -- 6.5  HGB 11.6* 12.6  HCT 33.4* 36.6  MCV 95.7 95.6  PLT 134* 147*    Studies: Dg Toe Great Right  03/31/2012  *RADIOLOGY REPORT*  Clinical Data: Fall, right toe pain  RIGHT GREAT TOE  Comparison: None.  Findings: No fracture or dislocation is seen.  Mild irregularity involving the base of the first distal phalanx on the oblique view is favored to be degenerative, given lack of corresponding abnormality on the frontal view.  Mild degenerative changes of the MTP and IP joint.  Possible mild diffuse soft tissue swelling.  IMPRESSION: No fracture or dislocation is seen.  Original Report Authenticated By: Charline Bills, M.D.     Scheduled Meds:    . amLODipine  10 mg Oral Daily  . beta carotene w/minerals  1 tablet Oral BID  . losartan  100 mg Oral Daily  . metoprolol  50 mg Oral BID  . raloxifene  60 mg Oral Daily  . sodium chloride  3 mL Intravenous Q12H  . [DISCONTINUED] enoxaparin (LOVENOX) injection  30 mg Subcutaneous Q12H  . [DISCONTINUED] heparin  5,000 Units Subcutaneous Q12H   Continuous Infusions:    . [DISCONTINUED] lactated ringers 1,000 mL (04/01/12 1759)    Principal  Problem:  *Subarachnoid hemorrhage following injury Active Problems:  Humeral distal fracture  Fall  Hard of hearing  Great toe pain     Jong Rickman A, MD  [facility] Team 5 Pager [phone]. If 8PM-8AM, please contact night-coverage at [email or web address], password [facility] 04/02/2012, 11:11 AM  LOS: 3 days

## 2012-04-02 NOTE — Progress Notes (Signed)
Occupational Therapy Treatment Patient Details Name: Melissa Carroll MRN: [id number] DOB: [birth year] Today's Date: 04/02/2012 Time: 4098-1191 OT Time Calculation (min): 24 min  OT Assessment / Plan / Recommendation Comments on Treatment Session Pt now s/p ORIF to L distal humerus.  Tolerating therapy well.      Follow Up Recommendations  SNF    Barriers to Discharge       Equipment Recommendations  None recommended by PT;None recommended by OT    Recommendations for Other Services    Frequency Min 2X/week   Plan      Precautions / Restrictions Precautions Precautions: Fall Required Braces or Orthoses:  (sling) Restrictions Weight Bearing Restrictions: Yes Other Position/Activity Restrictions: NWB 2* distal humerus fx LUE   Pertinent Vitals/Pain LUE sore    ADL  Grooming: Performed;Teeth care;Minimal assistance Where Assessed - Grooming: Unsupported standing Toilet Transfer: Performed;Minimal assistance Toilet Transfer Method: Sit to stand Toilet Transfer Equipment: Raised toilet seat with arms (or 3-in-1 over toilet) Toileting - Clothing Manipulation and Hygiene: Performed;Set up Where Assessed - Toileting Clothing Manipulation and Hygiene: Sit on 3-in-1 or toilet Transfers/Ambulation Related to ADLs: ambulated to bathroom with [facility]: Lambert Rhonda W ADL Comments: Educated on adls for LUE and moving fingers for edema prevention.      OT Diagnosis:    OT Problem List:   OT Treatment Interventions:     OT Goals ADL Goals Pt Will Transfer to Toilet: with min assist;3-in-1;Ambulation (min guard) ADL Goal: Toilet Transfer - Progress: Updated due to goal met Pt Will Perform Toileting - Hygiene: with min assist;Sit to stand from 3-in-1/toilet ADL Goal: Toileting - Hygiene - Progress: Progressing toward goals Arm Goals Additional Arm Goal #1: pt will be independent with edema management Arm Goal: Additional Goal #1 - Progress: Progressing toward goals Miscellaneous OT  Goals Miscellaneous OT Goal #1: pt will be independent directing caregiver in donning splint and sequence for donning/doffing shirt OT Goal: Miscellaneous Goal #1 - Progress: Progressing toward goals  Visit Information  Last OT Received On: 04/02/12 Assistance Needed: +1 PT/OT Co-Evaluation/Treatment: Yes    Subjective Data      Prior Functioning       Cognition  Overall Cognitive Status: Appears within functional limits for tasks assessed/performed Arousal/Alertness: Awake/alert Orientation Level: Appears intact for tasks assessed Behavior During Session: [facility] for tasks performed    Mobility  Shoulder Instructions Bed Mobility Supine to Sit: 4: Min assist Transfers Sit to Stand: 4: Min assist       Exercises      Balance Static Sitting Balance Static Sitting - Level of Assistance: 5: Stand by assistance   End of Session OT - End of Session Activity Tolerance: Patient tolerated treatment well Patient left: in chair;with call bell/phone within reach Nurse Communication: Mobility status (and use of 3:1 for toileting)  GO     Rosslyn Pasion 04/02/2012, 11:44 AM Marica Otter, OTR/L [phone] 04/02/2012

## 2012-04-02 NOTE — Op Note (Signed)
NAMEDeari, [facility]                ACCOUNT NO.:  [id number]  MEDICAL RECORD NO.:  [id number]  LOCATION:  1516                         FACILITY:  [facility]  PHYSICIAN:  Artist Pais. Odeal Welden, M.D.DATE OF BIRTH:  February 21, 1917  DATE OF PROCEDURE:  04/01/2012 DATE OF DISCHARGE:                              OPERATIVE REPORT   PREOPERATIVE DIAGNOSIS:  Displaced distal humeral fracture, left side.  POSTOPERATIVE DIAGNOSIS:  Displaced distal humeral fracture, left side.  PROCEDURE:  Closed reduction, percutaneous pinning above.  SURGEON:  Artist Pais. Mina Marble, M.D.  ASSISTANT:  Betha Loa, MD.  ANESTHESIA:  General.  TOURNIQUET TIME:  No tourniquet time.  COMPLICATIONS:  No complication.  DRAINS:  No drains.  PROCEDURE IN DETAIL:  The patient was taken to the operating suite. After induction of general anesthesia, left upper extremity was prepped and draped in sterile fashion.  An Esmarch was used to exsanguinate the limb.  Tourniquet was inflated to 250 mmHg.  At this point in time, a longitudinal traction was used to reduce a displaced distal humeral fracture.  Intraoperative fluoroscopy revealed the joint surface to be intact with a large distal transverse type fracture.  Two 2-mm K-wires were driven from the lateral aspect across the fracture site to exit medially and proximally.  The medial pin was placed anterior to the medial epicondyle and driven from distal to proximal under fluoroscopic guidance.  The fracture was deemed to be stable.  The K-wires were cut below the skin.  The patient was then dressed with Xeroform, 4x4s, and a posterior elbow splint.  The patient tolerated the procedure well and went to recovery room in stable fashion.     Artist Pais Mina Marble, M.D.     MAW/MEDQ  D:  04/01/2012  T:  04/02/2012  Job:  [id number]

## 2012-04-02 NOTE — Discharge Summary (Signed)
Physician Discharge Summary  Melissa Carroll:[id number] DOB: 10-20-1916 DOA: 03/30/2012  PCP: Ginette Otto, MD  Admit date: 03/30/2012 Discharge date: 04/02/2012  Recommendations for Outpatient Follow-up:  Please followup with Dr. Mina Marble, ortho on 04/07/2012, please call to make the appointment. Patient will need platform walker and can bear weight on left upper extremity/eblow as tolerated.  Discharge Diagnoses:  Principal Problem:  *Subarachnoid hemorrhage following injury Active Problems:  Humeral distal fracture  Fall  Hard of hearing  Great toe pain  Hypertension  Discharge Condition: Stable  Diet recommendation: Heart healthy diet.  Filed Weights   03/31/12 0140  Weight: 64.4 kg (141 lb 15.6 oz)    History of present illness:  High functioning 78 year old female who is generally healthy, she recently moved to an independent living facility. She uses a cane to ambulate, who presented to the ED on 03/31/2012 after falling twice.  Hospital Course:  S/p multiple mechanical falls prior to admission Continue PT, will need rehab at discharge.  Complex displaced supracondylar fracture of the distal humerus Dr. Mina Marble evaluated the patient and performed closed reduction with humeral pin insertion on 04/01/2012. Per Dr. Mina Marble patient will benefit from a platform walker. Patient to bear weight on left upper extremity/elbow as tolerated. Patient to followup with Dr. Mina Marble on 04/07/2012.  Right toe pain No fractures on imaging.  Traumatic SAH Asymptomatic. Repeat head CT on 04/01/2012 no SAH seen. Dr. Irene Limbo, discussed with Dr. Terrilee Files neurosurgery on 03/31/2012, he recommended no further evaluation and nimodipine was discontinued.  Code Status: DNR  Brief narrative: 89-old-woman with history of 2 mechanical falls prior to admission found to have small SAH and elbow fracture.  Consultants:  Orthopedics, Dr. Mina Marble.  Neurosurgery, Dr. Lovell Sheehan,  telephone consultation.  Procedures:  As above.  Discharge Exam: Filed Vitals:   04/02/12 0012 04/02/12 0100 04/02/12 0200 04/02/12 0600  BP: 134/49 138/73 133/63 130/56  Pulse: 68 65 62 60  Temp: 98.6 F (37 C) 98 F (36.7 C) 98.5 F (36.9 C) 97.7 F (36.5 C)  TempSrc: Oral Oral Oral Oral  Resp: 18 18 18 18   Height:      Weight:      SpO2: 97% 94% 96% 99%   Discharge Instructions  Discharge Orders    Future Orders Please Complete By Expires   Diet - low sodium heart healthy      Increase activity slowly      Discharge instructions      Comments:   Please followup with Dr. Mina Marble, ortho on 04/07/2012, please call to make the appointment. Patient will need platform walker and can bear weight on left upper extremity/eblow as tolerated.       Medication List     As of 04/02/2012 11:20 AM    TAKE these medications         acetaminophen 500 MG tablet   Commonly known as: TYLENOL   Take 500 mg by mouth every 6 (six) hours as needed. Pain      amLODipine 10 MG tablet   Commonly known as: NORVASC   Take 10 mg by mouth daily.      aspirin EC 81 MG tablet   Take 81 mg by mouth daily.      beta carotene w/minerals tablet   Take 1 tablet by mouth 2 (two) times daily.      losartan 100 MG tablet   Commonly known as: COZAAR   Take 100 mg by mouth daily.  metoprolol 50 MG tablet   Commonly known as: LOPRESSOR   Take 50 mg by mouth 2 (two) times daily.      ondansetron 4 MG tablet   Commonly known as: ZOFRAN   Take 1 tablet (4 mg total) by mouth every 6 (six) hours as needed for nausea.      oxyCODONE-acetaminophen 5-325 MG per tablet   Commonly known as: PERCOCET/ROXICET   Take 0.5-1 tablets by mouth every 6 (six) hours as needed for pain.      raloxifene 60 MG tablet   Commonly known as: EVISTA   Take 60 mg by mouth daily.      zolpidem 5 MG tablet   Commonly known as: AMBIEN   Take 2.5 mg by mouth at bedtime as needed. sleep             Follow-up Information    Follow up with Dairl Ponder A, MD. Schedule an appointment as soon as possible for a visit on 04/07/2012. (Please call to make the appointment.)    Contact information:   [address] [place] [address] [phone]           The results of significant diagnostics from this hospitalization (including imaging, microbiology, ancillary and laboratory) are listed below for reference.    Significant Diagnostic Studies: Dg Elbow 2 Views Left  03/30/2012  *RADIOLOGY REPORT*  Clinical Data: Larey Seat.  Left elbow pain.  LEFT ELBOW - 2 VIEW  Comparison: None  Findings: There is a comminuted supracondylar elbow fracture.  The radius and ulna appear intact.  IMPRESSION: Complex displaced supracondylar fracture of the distal humerus.   Original Report Authenticated By: Rudie Meyer, M.D.    Ct Head Wo Contrast  03/31/2012  *RADIOLOGY REPORT*  Clinical Data: Fall.  Intracranial hemorrhage.  Follow-up.  CT HEAD WITHOUT CONTRAST  Technique:  Contiguous axial images were obtained from the base of the skull through the vertex without contrast.  Comparison: 03/30/2012.  Findings: The previously noted tiny amount of subarachnoid hemorrhage seen in the right convexity is not apparent on the present examination.  No new intracranial hemorrhage noted.  No skull fracture.  Small vessel disease type changes.  Remote infarct left cerebellum.  Global atrophy without hydrocephalus.  No CT evidence of large acute infarct.  Right frontal calvarial 1 cm bony lesion may be an exostosis or meningioma. No associated mass effect.  IMPRESSION: The previously noted tiny amount of subarachnoid hemorrhage seen in the right convexity is not apparent on the present examination.  No new intracranial hemorrhage noted.   Original Report Authenticated By: Lacy Duverney, M.D.    Ct Head Wo Contrast  03/30/2012  *RADIOLOGY REPORT*  Clinical Data:  Head and neck pain post fall  CT HEAD WITHOUT CONTRAST CT  CERVICAL SPINE WITHOUT CONTRAST  Technique:  Multidetector CT imaging of the head and cervical spine was performed following the standard protocol without intravenous contrast.  Multiplanar CT image reconstructions of the cervical spine were also generated.  Comparison:  None  CT HEAD  Findings: Generalized atrophy. Normal ventricular morphology. No midline shift or mass effect. Small vessel chronic ischemic changes of deep cerebral white matter. At the right vertex, a small amount of high attenuation acute subarachnoid blood is identified. No additional intracranial hemorrhage, mass lesion, or acute infarction. 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 Visualized paranasal sinuses and mastoid air cells clear. Small sclerotic focus identified at the right frontal bone with a contiguous question exostosis arising from the inner table of  the calvarium, unchanged from 2011 radiographs.  IMPRESSION: Atrophy with small vessel chronic ischemic changes of deep cerebral white matter. Small amount of high attenuation subarachnoid hemorrhage is seen at the right vertex. 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 No additional acute intracranial abnormalities.  CT CERVICAL SPINE  Findings: Scattered atherosclerotic calcifications within the carotid systems. Biapical lung scarring. Visualized skull base intact. Disc space narrowing with endplate spur formation at C4-C5, C5-C6, C6-C7. Vertebral body heights maintained without fracture or subluxation. Minimal scattered facet degenerative changes. Prevertebral soft tissues normal thickness. Osseous demineralization.  IMPRESSION:  Degenerative disc disease changes cervical spine at C4-C5 through C6-C7. No acute cervical spine abnormalities identified.  Critical Value/emergent results were called by telephone at the time of interpretation on 03/30/2012 at 2014 hours to Dr. Radford Pax, who verbally acknowledged these results.   Original Report Authenticated By: Ulyses Southward, M.D.    Ct  Cervical Spine Wo Contrast  03/30/2012  *RADIOLOGY REPORT*  Clinical Data:  Head and neck pain post fall  CT HEAD WITHOUT CONTRAST CT CERVICAL SPINE WITHOUT CONTRAST  Technique:  Multidetector CT imaging of the head and cervical spine was performed following the standard protocol without intravenous contrast.  Multiplanar CT image reconstructions of the cervical spine were also generated.  Comparison:  None  CT HEAD  Findings: Generalized atrophy. Normal ventricular morphology. No midline shift or mass effect. Small vessel chronic ischemic changes of deep cerebral white matter. At the right vertex, a small amount of high attenuation acute subarachnoid blood is identified. No additional intracranial hemorrhage, mass lesion, or acute infarction. 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 Visualized paranasal sinuses and mastoid air cells clear. Small sclerotic focus identified at the right frontal bone with a contiguous question exostosis arising from the inner table of the calvarium, unchanged from 2011 radiographs.  IMPRESSION: Atrophy with small vessel chronic ischemic changes of deep cerebral white matter. Small amount of high attenuation subarachnoid hemorrhage is seen at the right vertex. 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 No additional acute intracranial abnormalities.  CT CERVICAL SPINE  Findings: Scattered atherosclerotic calcifications within the carotid systems. Biapical lung scarring. Visualized skull base intact. Disc space narrowing with endplate spur formation at C4-C5, C5-C6, C6-C7. Vertebral body heights maintained without fracture or subluxation. Minimal scattered facet degenerative changes. Prevertebral soft tissues normal thickness. Osseous demineralization.  IMPRESSION:  Degenerative disc disease changes cervical spine at C4-C5 through C6-C7. No acute cervical spine abnormalities identified.  Critical Value/emergent results were called by telephone at the time of interpretation on  03/30/2012 at 2014 hours to Dr. Radford Pax, who verbally acknowledged these results.   Original Report Authenticated By: Ulyses Southward, M.D.    Dg Chest Portable 1 View  03/30/2012  *RADIOLOGY REPORT*  Clinical Data: Elbow fracture, fall  PORTABLE CHEST - 1 VIEW  Comparison: Portable exam 2230 hours without priors for Romeo Apple  Findings: Upper normal heart size. Calcified tortuous aorta. Pulmonary vascularity normal. Slight rotation to the left. Lungs appear emphysematous with biapical scarring. No acute infiltrate, pleural effusion, or pneumothorax. Probable bilateral chronic rotator cuff tears. Osseous demineralization.  IMPRESSION: Emphysematous changes with biapical scarring. No acute abnormalities.   Original Report Authenticated By: Ulyses Southward, M.D.    Dg Hand Complete Left  03/30/2012  *RADIOLOGY REPORT*  Clinical Data: Larey Seat.  Injured ring finger.  LEFT HAND - COMPLETE 3+ VIEW  Comparison: None  Findings: There are moderate osteoarthritic type degenerative changes involving the DIP and PIP joints of the fingers.  Moderate osteoporosis is also noted.  There  is soft tissue swelling involving the ring finger but no definite acute fracture.  Suspect a remote healed fifth metacarpal fracture.  IMPRESSION:  1.  Osteoporosis and osteoarthritic type degenerative changes. 2.  No acute fracture.   Original Report Authenticated By: Rudie Meyer, M.D.    Dg Toe Great Right  03/31/2012  *RADIOLOGY REPORT*  Clinical Data: Fall, right toe pain  RIGHT GREAT TOE  Comparison: None.  Findings: No fracture or dislocation is seen.  Mild irregularity involving the base of the first distal phalanx on the oblique view is favored to be degenerative, given lack of corresponding abnormality on the frontal view.  Mild degenerative changes of the MTP and IP joint.  Possible mild diffuse soft tissue swelling.  IMPRESSION: No fracture or dislocation is seen.   Original Report Authenticated By: Charline Bills, M.D.      Microbiology: No results found for this or any previous visit (from the past 240 hour(s)).   Labs: Basic Metabolic Panel:  Lab 03/31/12 6295 03/30/12 2225  NA 139 137  K 3.5 3.1*  CL 104 101  CO2 24 24  GLUCOSE 115* 178*  BUN 17 23  CREATININE 0.53 0.60  CALCIUM 8.3* 9.0  MG -- --  PHOS -- --   Liver Function Tests:  Lab 03/31/12 0515  AST 29  ALT 20  ALKPHOS 38*  BILITOT 1.0  PROT 5.5*  ALBUMIN 3.0*   No results found for this basename: LIPASE:5,AMYLASE:5 in the last 168 hours No results found for this basename: AMMONIA:5 in the last 168 hours CBC:  Lab 03/31/12 0515 03/30/12 2225  WBC 9.0 9.6  NEUTROABS -- 6.5  HGB 11.6* 12.6  HCT 33.4* 36.6  MCV 95.7 95.6  PLT 134* 147*   Cardiac Enzymes: No results found for this basename: CKTOTAL:5,CKMB:5,CKMBINDEX:5,TROPONINI:5 in the last 168 hours BNP: BNP (last 3 results) No results found for this basename: PROBNP:3 in the last 8760 hours CBG: No results found for this basename: GLUCAP:5 in the last 168 hours   Time spent: 35 minutes   Signed:  Ariely Riddell A  [facility] 04/02/2012, 11:20 AM

## 2012-04-02 NOTE — Progress Notes (Signed)
Patient cleared for discharge. Packet copied and placed in [facility]. ptar called for transportation.  Venise Ellingwood C. Kirsten Spearing MSW, LCSW [phone]  

## 2012-04-02 NOTE — Progress Notes (Signed)
[facility], [facility] by daughter.  Discharge instructions and packet given to daughter with no further concerns.  [facility] at [facility].

## 2012-04-02 NOTE — Progress Notes (Signed)
CSW notified family of bed available at [facility] upon medical clearance.  Roseanne Juenger C. Rayford Williamsen MSW, LCSW [phone]

## 2012-04-03 ENCOUNTER — Encounter (HOSPITAL_COMMUNITY): Payer: Self-pay | Admitting: Orthopedic Surgery

## 2012-04-03 DIAGNOSIS — I10 Essential (primary) hypertension: Secondary | ICD-10-CM | POA: Diagnosis not present

## 2012-04-03 DIAGNOSIS — G4700 Insomnia, unspecified: Secondary | ICD-10-CM | POA: Diagnosis not present

## 2012-04-03 DIAGNOSIS — S42209A Unspecified fracture of upper end of unspecified humerus, initial encounter for closed fracture: Secondary | ICD-10-CM | POA: Diagnosis not present

## 2012-04-07 DIAGNOSIS — S42413A Displaced simple supracondylar fracture without intercondylar fracture of unspecified humerus, initial encounter for closed fracture: Secondary | ICD-10-CM | POA: Diagnosis not present

## 2012-04-07 DIAGNOSIS — E039 Hypothyroidism, unspecified: Secondary | ICD-10-CM | POA: Diagnosis not present

## 2012-04-07 DIAGNOSIS — I10 Essential (primary) hypertension: Secondary | ICD-10-CM | POA: Diagnosis not present

## 2012-04-07 DIAGNOSIS — M810 Age-related osteoporosis without current pathological fracture: Secondary | ICD-10-CM | POA: Diagnosis not present

## 2012-04-07 DIAGNOSIS — E559 Vitamin D deficiency, unspecified: Secondary | ICD-10-CM | POA: Diagnosis not present

## 2012-04-07 DIAGNOSIS — S9030XA Contusion of unspecified foot, initial encounter: Secondary | ICD-10-CM | POA: Diagnosis not present

## 2012-04-10 DIAGNOSIS — H3530 Unspecified macular degeneration: Secondary | ICD-10-CM | POA: Diagnosis not present

## 2012-04-10 DIAGNOSIS — H35059 Retinal neovascularization, unspecified, unspecified eye: Secondary | ICD-10-CM | POA: Diagnosis not present

## 2012-04-17 DIAGNOSIS — S42413A Displaced simple supracondylar fracture without intercondylar fracture of unspecified humerus, initial encounter for closed fracture: Secondary | ICD-10-CM | POA: Diagnosis not present

## 2012-04-23 DIAGNOSIS — S42413A Displaced simple supracondylar fracture without intercondylar fracture of unspecified humerus, initial encounter for closed fracture: Secondary | ICD-10-CM | POA: Diagnosis not present

## 2012-04-29 DIAGNOSIS — S42413A Displaced simple supracondylar fracture without intercondylar fracture of unspecified humerus, initial encounter for closed fracture: Secondary | ICD-10-CM | POA: Diagnosis not present

## 2012-04-29 DIAGNOSIS — T8489XA Other specified complication of internal orthopedic prosthetic devices, implants and grafts, initial encounter: Secondary | ICD-10-CM | POA: Diagnosis not present

## 2012-04-29 DIAGNOSIS — Y831 Surgical operation with implant of artificial internal device as the cause of abnormal reaction of the patient, or of later complication, without mention of misadventure at the time of the procedure: Secondary | ICD-10-CM | POA: Diagnosis not present

## 2012-05-04 DIAGNOSIS — M79609 Pain in unspecified limb: Secondary | ICD-10-CM | POA: Diagnosis not present

## 2012-05-04 DIAGNOSIS — B351 Tinea unguium: Secondary | ICD-10-CM | POA: Diagnosis not present

## 2012-05-05 DIAGNOSIS — E039 Hypothyroidism, unspecified: Secondary | ICD-10-CM | POA: Diagnosis not present

## 2012-05-05 DIAGNOSIS — G4700 Insomnia, unspecified: Secondary | ICD-10-CM | POA: Diagnosis not present

## 2012-05-05 DIAGNOSIS — S42413A Displaced simple supracondylar fracture without intercondylar fracture of unspecified humerus, initial encounter for closed fracture: Secondary | ICD-10-CM | POA: Diagnosis not present

## 2012-05-05 DIAGNOSIS — I10 Essential (primary) hypertension: Secondary | ICD-10-CM | POA: Diagnosis not present

## 2012-05-05 DIAGNOSIS — S42209A Unspecified fracture of upper end of unspecified humerus, initial encounter for closed fracture: Secondary | ICD-10-CM | POA: Diagnosis not present

## 2012-05-26 DIAGNOSIS — S42413A Displaced simple supracondylar fracture without intercondylar fracture of unspecified humerus, initial encounter for closed fracture: Secondary | ICD-10-CM | POA: Diagnosis not present

## 2012-05-26 DIAGNOSIS — M1710 Unilateral primary osteoarthritis, unspecified knee: Secondary | ICD-10-CM | POA: Diagnosis not present

## 2012-05-28 DIAGNOSIS — S42209A Unspecified fracture of upper end of unspecified humerus, initial encounter for closed fracture: Secondary | ICD-10-CM | POA: Diagnosis not present

## 2012-05-28 DIAGNOSIS — I10 Essential (primary) hypertension: Secondary | ICD-10-CM | POA: Diagnosis not present

## 2012-05-28 DIAGNOSIS — G4700 Insomnia, unspecified: Secondary | ICD-10-CM | POA: Diagnosis not present

## 2012-05-29 ENCOUNTER — Emergency Department (HOSPITAL_COMMUNITY): Payer: Medicare Other

## 2012-05-29 ENCOUNTER — Emergency Department (HOSPITAL_COMMUNITY)
Admission: EM | Admit: 2012-05-29 | Discharge: 2012-05-29 | Disposition: A | Discharge status: Home / Self Care | Payer: Medicare Other | Attending: Emergency Medicine | Admitting: Emergency Medicine

## 2012-05-29 ENCOUNTER — Encounter (HOSPITAL_COMMUNITY): Payer: Self-pay | Admitting: Emergency Medicine

## 2012-05-29 DIAGNOSIS — S52599A Other fractures of lower end of unspecified radius, initial encounter for closed fracture: Secondary | ICD-10-CM | POA: Diagnosis not present

## 2012-05-29 DIAGNOSIS — S1093XA Contusion of unspecified part of neck, initial encounter: Secondary | ICD-10-CM | POA: Diagnosis not present

## 2012-05-29 DIAGNOSIS — S0190XA Unspecified open wound of unspecified part of head, initial encounter: Secondary | ICD-10-CM | POA: Diagnosis not present

## 2012-05-29 DIAGNOSIS — S0003XA Contusion of scalp, initial encounter: Secondary | ICD-10-CM | POA: Insufficient documentation

## 2012-05-29 DIAGNOSIS — Z79899 Other long term (current) drug therapy: Secondary | ICD-10-CM | POA: Insufficient documentation

## 2012-05-29 DIAGNOSIS — M129 Arthropathy, unspecified: Secondary | ICD-10-CM | POA: Diagnosis not present

## 2012-05-29 DIAGNOSIS — I10 Essential (primary) hypertension: Secondary | ICD-10-CM | POA: Diagnosis not present

## 2012-05-29 DIAGNOSIS — M1990 Unspecified osteoarthritis, unspecified site: Secondary | ICD-10-CM | POA: Insufficient documentation

## 2012-05-29 DIAGNOSIS — G4700 Insomnia, unspecified: Secondary | ICD-10-CM | POA: Diagnosis not present

## 2012-05-29 DIAGNOSIS — S52501A Unspecified fracture of the lower end of right radius, initial encounter for closed fracture: Secondary | ICD-10-CM

## 2012-05-29 DIAGNOSIS — Y9210 Unspecified residential institution as the place of occurrence of the external cause: Secondary | ICD-10-CM | POA: Insufficient documentation

## 2012-05-29 DIAGNOSIS — IMO0002 Reserved for concepts with insufficient information to code with codable children: Secondary | ICD-10-CM | POA: Diagnosis not present

## 2012-05-29 DIAGNOSIS — H269 Unspecified cataract: Secondary | ICD-10-CM | POA: Insufficient documentation

## 2012-05-29 DIAGNOSIS — S0001XA Abrasion of scalp, initial encounter: Secondary | ICD-10-CM

## 2012-05-29 DIAGNOSIS — T148XXA Other injury of unspecified body region, initial encounter: Secondary | ICD-10-CM | POA: Diagnosis not present

## 2012-05-29 DIAGNOSIS — S0083XA Contusion of other part of head, initial encounter: Secondary | ICD-10-CM | POA: Diagnosis not present

## 2012-05-29 DIAGNOSIS — Z7982 Long term (current) use of aspirin: Secondary | ICD-10-CM | POA: Diagnosis not present

## 2012-05-29 DIAGNOSIS — E039 Hypothyroidism, unspecified: Secondary | ICD-10-CM | POA: Diagnosis not present

## 2012-05-29 DIAGNOSIS — Y9301 Activity, walking, marching and hiking: Secondary | ICD-10-CM | POA: Insufficient documentation

## 2012-05-29 DIAGNOSIS — M25539 Pain in unspecified wrist: Secondary | ICD-10-CM | POA: Diagnosis not present

## 2012-05-29 DIAGNOSIS — W19XXXA Unspecified fall, initial encounter: Secondary | ICD-10-CM | POA: Insufficient documentation

## 2012-05-29 DIAGNOSIS — S59909A Unspecified injury of unspecified elbow, initial encounter: Secondary | ICD-10-CM | POA: Diagnosis not present

## 2012-05-29 DIAGNOSIS — S62109A Fracture of unspecified carpal bone, unspecified wrist, initial encounter for closed fracture: Secondary | ICD-10-CM | POA: Diagnosis not present

## 2012-05-29 NOTE — ED Notes (Signed)
XR at bedside

## 2012-05-29 NOTE — ED Notes (Signed)
Report given via EMS. Pt c/o unwitnessed fall in [facility]. No neck or back pain. Pt denies LOC. Pt remembers slipping from bathroom. Right wrist pain 2/10. Left humerus fx from weeks prior which was cleared today. Quarter inch lac on posterior head. Initial VS 158/64 HR 80 RR 16 regular spO 98% RA at 0035.

## 2012-05-29 NOTE — ED Notes (Signed)
[id number]<AV> Expected date:<BR> Expected time:<BR> Means of arrival:<BR> Comments:<BR> EMS/elderly fall from [facility]-struck head-no blood thinners

## 2012-05-29 NOTE — ED Notes (Signed)
[facility] contacted 

## 2012-05-29 NOTE — ED Provider Notes (Signed)
History     CSN: [id number]  Arrival date & time 05/29/12  0058   First MD Initiated Contact with Patient 05/29/12 0107      Chief Complaint  Patient presents with  . Fall    (Consider location/radiation/quality/duration/timing/severity/associated sxs/prior treatment) HPI Comments: Melissa Carroll reports tripping and falling while getting into bed.  She denies any significant pain except soreness in her right wrist.  She denies any LOC.  She is not sure what she might have hit with her head.  Patient is a 79 y.o. female presenting with fall. The history is provided by the patient. No language interpreter was used.  Fall The accident occurred 1 to 2 hours ago. The fall occurred while walking. She fell from a height of 1 to 2 ft. She landed on a hard floor. The point of impact was the head. The pain is present in the right wrist. The pain is at a severity of 2/10. The pain is mild. She was ambulatory at the scene. There was no entrapment after the fall. There was no drug use involved in the accident. There was no alcohol use involved in the accident. Associated symptoms include hearing loss (chronic - came to hospital without hearing aids). Pertinent negatives include no visual change, no fever, no abdominal pain, no bowel incontinence, no nausea, no vomiting, no headaches, no loss of consciousness and no tingling. Treatment on scene includes a c-collar. She has tried nothing for the symptoms.    Past Medical History  Diagnosis Date  . Arthritis   . Osteoarthritis   . Hypertension   . Cataracts, bilateral     Past Surgical History  Procedure Date  . Ankle fracture surgery   . Abdominal hysterectomy   . Closed reduction with humeral pin insertion 04/01/2012    Procedure: CLOSED REDUCTION WITH HUMERAL PIN INSERTION;  Surgeon: Marlowe Shores, MD;  Location: [facility];  Service: Orthopedics;  Laterality: Left;    No family history on file.  History  Substance Use Topics  . Smoking  status: Never Smoker   . Smokeless tobacco: Not on file  . Alcohol Use: No    OB History    Grav Para Term Preterm Abortions TAB SAB Ect Mult Living                  Review of Systems  Constitutional: Negative for fever.  Gastrointestinal: Negative for nausea, vomiting, abdominal pain and bowel incontinence.  Neurological: Negative for tingling, loss of consciousness and headaches.  All other systems reviewed and are negative.    Allergies  Review of patient's allergies indicates no known allergies.  Home Medications   Current Outpatient Rx  Name  Route  Sig  Dispense  Refill  . AMLODIPINE BESYLATE 10 MG PO TABS   Oral   Take 10 mg by mouth daily.         . ASPIRIN EC 81 MG PO TBEC   Oral   Take 81 mg by mouth daily.         . OCUVITE PO TABS   Oral   Take 1 tablet by mouth 2 (two) times daily.          Marland Kitchen VITAMIN D 1000 UNITS PO TABS   Oral   Take 1,000 Units by mouth daily.         Marland Kitchen LEVOTHYROXINE SODIUM 25 MCG PO TABS   Oral   Take 25 mcg by mouth daily.         Marland Kitchen  LOSARTAN POTASSIUM 100 MG PO TABS   Oral   Take 100 mg by mouth daily.         Marland Kitchen METOPROLOL TARTRATE 50 MG PO TABS   Oral   Take 50 mg by mouth 2 (two) times daily.         . ADULT MULTIVITAMIN W/MINERALS CH   Oral   Take 1 tablet by mouth daily.         Marland Kitchen RALOXIFENE HCL 60 MG PO TABS   Oral   Take 60 mg by mouth daily.         Bernadette Hoit SODIUM 8.6-50 MG PO TABS   Oral   Take 2 tablets by mouth at bedtime.         Marland Kitchen ZOLPIDEM TARTRATE 5 MG PO TABS   Oral   Take 2.5-5 mg by mouth at bedtime as needed. sleep         . ACETAMINOPHEN 500 MG PO TABS   Oral   Take 500 mg by mouth every 6 (six) hours as needed. Pain         . ONDANSETRON HCL 4 MG PO TABS   Oral   Take 1 tablet (4 mg total) by mouth every 6 (six) hours as needed for nausea.   20 tablet   0   . OXYCODONE-ACETAMINOPHEN 5-325 MG PO TABS   Oral   Take 0.5-1 tablets by mouth every 6  (six) hours as needed for pain.   20 tablet   0     BP 157/68  Pulse 75  Temp 97.6 F (36.4 C) (Oral)  Resp 18  SpO2 95%  Physical Exam  Nursing note and vitals reviewed. Constitutional: She is oriented to person, place, and time. She appears well-developed and well-nourished. No distress.  HENT:  Head: Normocephalic. Head is with abrasion and with contusion. Head is without raccoon's eyes, without Battle's sign, without right periorbital erythema and without left periorbital erythema.    Right Ear: External ear normal. No mastoid tenderness. No hemotympanum.  Left Ear: External ear normal. No mastoid tenderness. No hemotympanum.  Nose: Nose normal. No rhinorrhea. No epistaxis. Right sinus exhibits no maxillary sinus tenderness and no frontal sinus tenderness. Left sinus exhibits no maxillary sinus tenderness and no frontal sinus tenderness.  Mouth/Throat: Uvula is midline, oropharynx is clear and moist and mucous membranes are normal. Mucous membranes are not pale, not dry and not cyanotic. No oropharyngeal exudate.  Neck: Trachea normal, full passive range of motion without pain and phonation normal. Neck supple. No JVD present. No tracheal tenderness, no spinous process tenderness and no muscular tenderness present. Carotid bruit is not present. No rigidity. Decreased range of motion present. No tracheal deviation, no edema and no erythema present.  Cardiovascular: Normal rate, regular rhythm, normal heart sounds and intact distal pulses.  Exam reveals no gallop and no friction rub.   No murmur heard. Pulmonary/Chest: Effort normal and breath sounds normal. No stridor. No respiratory distress. She has no wheezes. She has no rales. She exhibits no tenderness.  Abdominal: Soft. Bowel sounds are normal. She exhibits no distension and no mass. There is no tenderness. There is no rebound and no guarding.  Musculoskeletal: She exhibits edema (bilat, L > R (this is chronic per Ms. Trolinger))  and tenderness.       Right shoulder: Normal.       Left shoulder: Normal.       Right wrist: She exhibits tenderness and swelling. She exhibits  normal range of motion, no bony tenderness, no effusion, no crepitus, no deformity and no laceration.       Left wrist: Normal.       Right hip: Normal.       Left hip: Normal.       Right knee: Normal.       Left knee: Normal.       Note tenderness of right wrist over the distal radius.  Pt has no obvious deformity and no snuff box tenderness.  Pulse and capillary refill is intact.  Neurological: She is alert and oriented to person, place, and time. No cranial nerve deficit.  Skin: Skin is warm and dry. No rash noted. She is not diaphoretic. No erythema. No pallor.  Psychiatric: She has a normal mood and affect. Her behavior is normal.    ED Course  Procedures (including critical care time)  Labs Reviewed - No data to display Dg Wrist Complete Right  05/29/2012  *RADIOLOGY REPORT*  Clinical Data: Pain post fall.  RIGHT WRIST - COMPLETE 3+ VIEW  Comparison: None.  Findings: Diffuse osteopenia.  Narrowing of the articular cartilage in the radiocarpal compartment and STT articulation. Chondrocalcinosis noted in the TFCC.  There is a subtle cortical irregularity at the lateral aspect of the radial metaphysis which may represent a minimally-displaced fracture.  There is no fracture line extension to the subchondral cortex. There is no impaction or angulation deformity.  IMPRESSION: 1.  Possible nondisplaced fracture of the radial metaphysis. Correlate with point tenderness. 2.  Osteopenia and degenerative changes as above.   Original Report Authenticated By: D. Andria Rhein, MD    Ct Head Wo Contrast  05/29/2012  *RADIOLOGY REPORT*  Clinical Data: Fall with laceration to the back of head.  CT HEAD WITHOUT CONTRAST  Technique:  Contiguous axial images were obtained from the base of the skull through the vertex without contrast.  Comparison: 03/31/2012   Findings: Diffuse cerebral atrophy.  Low attenuation change in the deep white matter consistent with small vessel ischemia. Ventricular dilatation consistent with central atrophy.  No mass effect or midline shift.  No abnormal extra-axial fluid collections.  Gray-white matter junctions are distinct.  Basal cisterns are not effaced.  No evidence of acute intracranial hemorrhage.  Visualized mastoid air cells and paranasal sinuses are not opacified.  No depressed skull fractures.  No significant change since previous study.  IMPRESSION: No acute intracranial abnormalities.  Chronic atrophy and small vessel ischemic changes.   Original Report Authenticated By: Burman Nieves, M.D.      No diagnosis found.    MDM  Pt presents for evaluation after a fall at her nursing home.  She has right wrist discomfort and some occipital bleeding.  She denies any LOC and denies any other pain. Note stable VS, NAD.  She has no midline neck tenderness, mental status changes, or distracting injury.  C-collar removed.  Will have the scalp cleaned for closer inspection the scalp wound.  Will obtain x-rays of the wrist and a head CT.  Will reassess.  0400.  Pt stable, NAD.  There is no acute traumatic injury on the CT scan of the head.  Closer inspection of the scalp demonstrates an abrasion of the scalp without a wound that would require primary closure.  She now complains of right wrist pain.  There is a small distal radius bony abnormality noted on the x-rays of the wrist.  She has corresponding point tenderness.  Radial gutter splint ordered.  She  will follow-up as an outpt with Dr. Mina Marble.  She is already being treated by him for an injury to her left arm.  She is residing in a skilled nursing facility.  Plan discharge home.     Tobin Chad, MD 05/29/12 [phone]

## 2012-05-30 DIAGNOSIS — M25529 Pain in unspecified elbow: Secondary | ICD-10-CM | POA: Diagnosis not present

## 2012-06-02 DIAGNOSIS — E039 Hypothyroidism, unspecified: Secondary | ICD-10-CM | POA: Diagnosis not present

## 2012-06-02 DIAGNOSIS — S42209A Unspecified fracture of upper end of unspecified humerus, initial encounter for closed fracture: Secondary | ICD-10-CM | POA: Diagnosis not present

## 2012-06-02 DIAGNOSIS — I10 Essential (primary) hypertension: Secondary | ICD-10-CM | POA: Diagnosis not present

## 2012-06-02 DIAGNOSIS — G4700 Insomnia, unspecified: Secondary | ICD-10-CM | POA: Diagnosis not present

## 2012-06-02 DIAGNOSIS — S52599A Other fractures of lower end of unspecified radius, initial encounter for closed fracture: Secondary | ICD-10-CM | POA: Diagnosis not present

## 2012-06-04 LAB — BASIC METABOLIC PANEL
BUN: 10 mg/dL (ref 4–21)
Creatinine: 0.5 mg/dL (ref 0.5–1.1)

## 2012-06-23 DIAGNOSIS — S42413A Displaced simple supracondylar fracture without intercondylar fracture of unspecified humerus, initial encounter for closed fracture: Secondary | ICD-10-CM | POA: Diagnosis not present

## 2012-06-23 DIAGNOSIS — S52599A Other fractures of lower end of unspecified radius, initial encounter for closed fracture: Secondary | ICD-10-CM | POA: Diagnosis not present

## 2012-06-30 DIAGNOSIS — M1990 Unspecified osteoarthritis, unspecified site: Secondary | ICD-10-CM | POA: Diagnosis not present

## 2012-06-30 DIAGNOSIS — I10 Essential (primary) hypertension: Secondary | ICD-10-CM | POA: Diagnosis not present

## 2012-07-07 DIAGNOSIS — H35059 Retinal neovascularization, unspecified, unspecified eye: Secondary | ICD-10-CM | POA: Diagnosis not present

## 2012-07-21 DIAGNOSIS — S42413A Displaced simple supracondylar fracture without intercondylar fracture of unspecified humerus, initial encounter for closed fracture: Secondary | ICD-10-CM | POA: Diagnosis not present

## 2012-07-21 DIAGNOSIS — S52599A Other fractures of lower end of unspecified radius, initial encounter for closed fracture: Secondary | ICD-10-CM | POA: Diagnosis not present

## 2012-07-23 DIAGNOSIS — R41841 Cognitive communication deficit: Secondary | ICD-10-CM | POA: Diagnosis not present

## 2012-07-24 DIAGNOSIS — R269 Unspecified abnormalities of gait and mobility: Secondary | ICD-10-CM | POA: Diagnosis not present

## 2012-07-27 DIAGNOSIS — R41841 Cognitive communication deficit: Secondary | ICD-10-CM | POA: Diagnosis not present

## 2012-07-28 DIAGNOSIS — R41841 Cognitive communication deficit: Secondary | ICD-10-CM | POA: Diagnosis not present

## 2012-07-30 DIAGNOSIS — R269 Unspecified abnormalities of gait and mobility: Secondary | ICD-10-CM | POA: Diagnosis not present

## 2012-07-30 DIAGNOSIS — Z9181 History of falling: Secondary | ICD-10-CM | POA: Diagnosis not present

## 2012-08-04 DIAGNOSIS — R29818 Other symptoms and signs involving the nervous system: Secondary | ICD-10-CM | POA: Diagnosis not present

## 2012-08-04 DIAGNOSIS — H35059 Retinal neovascularization, unspecified, unspecified eye: Secondary | ICD-10-CM | POA: Diagnosis not present

## 2012-08-04 DIAGNOSIS — R41841 Cognitive communication deficit: Secondary | ICD-10-CM | POA: Diagnosis not present

## 2012-08-04 DIAGNOSIS — Z9181 History of falling: Secondary | ICD-10-CM | POA: Diagnosis not present

## 2012-08-04 DIAGNOSIS — R269 Unspecified abnormalities of gait and mobility: Secondary | ICD-10-CM | POA: Diagnosis not present

## 2012-08-04 DIAGNOSIS — H353290 Exudative age-related macular degeneration, unspecified eye, stage unspecified: Secondary | ICD-10-CM | POA: Diagnosis not present

## 2012-08-05 ENCOUNTER — Other Ambulatory Visit: Payer: Self-pay | Admitting: Geriatric Medicine

## 2012-08-05 DIAGNOSIS — R29818 Other symptoms and signs involving the nervous system: Secondary | ICD-10-CM | POA: Diagnosis not present

## 2012-08-05 DIAGNOSIS — R41841 Cognitive communication deficit: Secondary | ICD-10-CM | POA: Diagnosis not present

## 2012-08-05 DIAGNOSIS — R269 Unspecified abnormalities of gait and mobility: Secondary | ICD-10-CM | POA: Diagnosis not present

## 2012-08-05 DIAGNOSIS — Z9181 History of falling: Secondary | ICD-10-CM | POA: Diagnosis not present

## 2012-08-05 MED ORDER — ZOLPIDEM TARTRATE 5 MG PO TABS: ORAL_TABLET | ORAL | Status: DC

## 2012-08-07 DIAGNOSIS — R29818 Other symptoms and signs involving the nervous system: Secondary | ICD-10-CM | POA: Diagnosis not present

## 2012-08-07 DIAGNOSIS — R269 Unspecified abnormalities of gait and mobility: Secondary | ICD-10-CM | POA: Diagnosis not present

## 2012-08-07 DIAGNOSIS — Z9181 History of falling: Secondary | ICD-10-CM | POA: Diagnosis not present

## 2012-08-07 DIAGNOSIS — R41841 Cognitive communication deficit: Secondary | ICD-10-CM | POA: Diagnosis not present

## 2012-08-10 DIAGNOSIS — R29818 Other symptoms and signs involving the nervous system: Secondary | ICD-10-CM | POA: Diagnosis not present

## 2012-08-10 DIAGNOSIS — R41841 Cognitive communication deficit: Secondary | ICD-10-CM | POA: Diagnosis not present

## 2012-08-10 DIAGNOSIS — R269 Unspecified abnormalities of gait and mobility: Secondary | ICD-10-CM | POA: Diagnosis not present

## 2012-08-10 DIAGNOSIS — Z9181 History of falling: Secondary | ICD-10-CM | POA: Diagnosis not present

## 2012-08-12 DIAGNOSIS — Z9181 History of falling: Secondary | ICD-10-CM | POA: Diagnosis not present

## 2012-08-12 DIAGNOSIS — R269 Unspecified abnormalities of gait and mobility: Secondary | ICD-10-CM | POA: Diagnosis not present

## 2012-08-13 DIAGNOSIS — Z9181 History of falling: Secondary | ICD-10-CM | POA: Diagnosis not present

## 2012-08-13 DIAGNOSIS — R269 Unspecified abnormalities of gait and mobility: Secondary | ICD-10-CM | POA: Diagnosis not present

## 2012-08-14 ENCOUNTER — Non-Acute Institutional Stay: Payer: Medicare Other | Admitting: Nurse Practitioner

## 2012-08-14 DIAGNOSIS — G4700 Insomnia, unspecified: Secondary | ICD-10-CM | POA: Insufficient documentation

## 2012-08-14 DIAGNOSIS — I10 Essential (primary) hypertension: Secondary | ICD-10-CM

## 2012-08-14 DIAGNOSIS — Z5189 Encounter for other specified aftercare: Secondary | ICD-10-CM | POA: Diagnosis not present

## 2012-08-14 DIAGNOSIS — M1990 Unspecified osteoarthritis, unspecified site: Secondary | ICD-10-CM | POA: Diagnosis not present

## 2012-08-14 DIAGNOSIS — S42309D Unspecified fracture of shaft of humerus, unspecified arm, subsequent encounter for fracture with routine healing: Secondary | ICD-10-CM

## 2012-08-14 DIAGNOSIS — S42402D Unspecified fracture of lower end of left humerus, subsequent encounter for fracture with routine healing: Secondary | ICD-10-CM

## 2012-08-14 DIAGNOSIS — E039 Hypothyroidism, unspecified: Secondary | ICD-10-CM | POA: Diagnosis not present

## 2012-08-14 DIAGNOSIS — S066X9D Traumatic subarachnoid hemorrhage with loss of consciousness of unspecified duration, subsequent encounter: Secondary | ICD-10-CM

## 2012-08-14 NOTE — Progress Notes (Signed)
Subjective:    Patient ID: Melissa Carroll, female    DOB: June 25, 1916, 79 y.o.   MRN: [id number]  HPI  Hypothyroidism: Levothyroxine stated 04/07/12, f/u TSH 3.571 06/04/12 HTN UNSPECIFIED  controlled on Losartan, Amlodipine, and Metoprolol SUBARACHNOID HEMORRHAGE  sustained from the fall. Stable.  OSTEOARTHRITIS  multiple sites.  OSTEOPOROSIS  takes Evista INSOMNIA, UNSPECIFIED  Ambien needed-used nightly-not adequate FRACTURE OF HUMERUS  left distal. healedf/u Dr. Parthenia Ames, prn Oxycodone/APAP for pain.   Review of Systems  Constitutional: Negative for fever, chills, diaphoresis, activity change, appetite change, fatigue and unexpected weight change.  HENT: Positive for hearing loss. Negative for congestion, rhinorrhea, trouble swallowing, neck pain, neck stiffness, voice change, postnasal drip and ear discharge.   Eyes: Negative for pain, discharge, itching and visual disturbance.  Respiratory: Negative for cough, choking, chest tightness, shortness of breath and wheezing.   Cardiovascular: Negative for chest pain, palpitations and leg swelling.  Gastrointestinal: Negative for nausea, vomiting, abdominal pain, diarrhea and constipation.  Endocrine: Negative for cold intolerance, heat intolerance, polydipsia, polyphagia and polyuria.       Has been treated for hypothyroidism  Genitourinary: Negative for dysuria, urgency, frequency and flank pain.  Musculoskeletal: Positive for back pain, arthralgias (left arm) and gait problem.  Skin: Negative for pallor, rash and wound.  Allergic/Immunologic: Negative.   Neurological: Negative for tremors, syncope, speech difficulty, weakness, numbness and headaches.  Hematological: Negative.   Psychiatric/Behavioral: Positive for confusion (occasionally) and sleep disturbance. Negative for hallucinations, behavioral problems, dysphoric mood and agitation. The patient is not nervous/anxious.        Objective:   Physical Exam  Constitutional:  She is oriented to person, place, and time. She appears well-developed and well-nourished.  HENT:  Head: Normocephalic and atraumatic.  Eyes: Conjunctivae and EOM are normal. Pupils are equal, round, and reactive to light.  Neck: Normal range of motion. Neck supple. No JVD present. No thyromegaly present.  Cardiovascular: Normal rate, regular rhythm and normal heart sounds.   No murmur heard. Pulmonary/Chest: Effort normal and breath sounds normal. No respiratory distress. She has no wheezes. She has no rales.  Abdominal: Soft. Bowel sounds are normal. There is no tenderness.  Musculoskeletal: Normal range of motion. She exhibits no edema and no tenderness.  Lymphadenopathy:    She has no cervical adenopathy.  Neurological: She is alert and oriented to person, place, and time. She displays normal reflexes. No cranial nerve deficit. She exhibits normal muscle tone. Coordination normal.  Skin: Skin is warm and dry. No rash noted. No erythema.  Psychiatric: Her mood appears not anxious. Her affect is not angry, not labile and not inappropriate. Her speech is not rapid and/or pressured, not delayed and not slurred. She is not agitated, not aggressive, not slowed and not withdrawn. Thought content is not paranoid and not delusional. She does not express impulsivity or inappropriate judgment. She exhibits a depressed mood. She exhibits abnormal recent memory.     LABS REVIEWED: 03/31/12 Na 139, K 3.5, glucose 115, Bun 17, creatinine 0.53, Ca 8.3 AST 29, ALT 20, Alk phos 38, Bilitot 1.0, protein 5.5, albumin 3.0 wbc 9.0, Hgb 11.6, Hct 33.4, plt 134 04/06/12 CBC wbc 7.6, Hgb 12.1, Hct 35.8, plt 244 CMP na 135, K 4.3, glucose 106, Bun 18, creatinine 0.59, Ca 8.5, LFT wnl except total protein 5.7, albumin 3.3 TSH 6.594 Vit D 25 06/04/12 BMP Na 137, K 4.0, glucose 104, Bun 19, creatinine 0.51, Ca 8.6 TSH 3.571      Assessment &  Plan:   Humeral distal fracture left healed.   . Hard of hearing  hearing aids  . Subarachnoid hemorrhage following injury stable.    . Hypertension Controlled.   Marland Kitchen Unspecified hypothyroidism F/u TSH   . Osteoarthrosis, unspecified whether generalized or localized, unspecified site recent fxs  . Insomnia, unspecified  Adding Mirtazapine 7.5mg  nightly continue Ambien 5mg  prn. Check TSH  Hypothyroidism: continue Synthroid 25nmcg, f/u TSH

## 2012-08-17 DIAGNOSIS — Z9181 History of falling: Secondary | ICD-10-CM | POA: Diagnosis not present

## 2012-08-17 DIAGNOSIS — E039 Hypothyroidism, unspecified: Secondary | ICD-10-CM | POA: Diagnosis not present

## 2012-08-17 DIAGNOSIS — R269 Unspecified abnormalities of gait and mobility: Secondary | ICD-10-CM | POA: Diagnosis not present

## 2012-08-17 LAB — TSH: TSH: 5.89 u[IU]/mL (ref <=5.90)

## 2012-08-18 ENCOUNTER — Non-Acute Institutional Stay: Payer: Medicare Other | Admitting: Nurse Practitioner

## 2012-08-18 DIAGNOSIS — R269 Unspecified abnormalities of gait and mobility: Secondary | ICD-10-CM | POA: Diagnosis not present

## 2012-08-18 DIAGNOSIS — E039 Hypothyroidism, unspecified: Secondary | ICD-10-CM

## 2012-08-18 DIAGNOSIS — G4700 Insomnia, unspecified: Secondary | ICD-10-CM

## 2012-08-18 DIAGNOSIS — I10 Essential (primary) hypertension: Secondary | ICD-10-CM

## 2012-08-18 DIAGNOSIS — Z9181 History of falling: Secondary | ICD-10-CM | POA: Diagnosis not present

## 2012-08-18 MED ORDER — LEVOTHYROXINE SODIUM 50 MCG PO TABS: ORAL_TABLET | ORAL | Status: DC

## 2012-08-18 NOTE — Assessment & Plan Note (Signed)
Increase Levothyroxine to , f/u TSH in 12 weeks.

## 2012-08-18 NOTE — Progress Notes (Signed)
Patient ID: Melissa Carroll, female   DOB: 05/14/1916, 79 y.o.   MRN: [id number]  Subjective:    Patient ID: Melissa Carroll, female    DOB: 08/22/1916, 79 y.o.   MRN: [id number]  HPI   79 year old female recently was transferred to AL after [facility]F stay  for left humeral fx healing and rehabilitation. C/o insomnia as mainly issue since admitted to AL-changed her mattress didn't help. Mirtazapine 7.5mg  started 3 days ago seems working. Her TSH showed trending up to 5.891 from 2.967 07/06/12--this may contribute to her insomnia--will adjust Levothyroxine today in hope of improving night sleep.  Hypothyroidism: Levothyroxine stated 04/07/12, f/u TSH 3.571 06/04/12, then 5.891 06/19/12 HTN UNSPECIFIED  controlled on Losartan, Amlodipine, and Metoprolol SUBARACHNOID HEMORRHAGE  sustained from the fall. Stable.  OSTEOARTHRITIS  multiple sites.  OSTEOPOROSIS  takes Evista INSOMNIA, UNSPECIFIED  Ambien needed-used nightly-not adequate FRACTURE OF HUMERUS  left distal. healedf/u Dr. Parthenia Ames, prn Oxycodone/APAP for pain.   Review of Systems  Constitutional: Negative for fever, chills, diaphoresis, activity change, appetite change, fatigue and unexpected weight change.  HENT: Positive for hearing loss. Negative for congestion, rhinorrhea, trouble swallowing, neck pain, neck stiffness, voice change, postnasal drip and ear discharge.   Eyes: Negative for pain, discharge, itching and visual disturbance.  Respiratory: Negative for cough, choking, chest tightness, shortness of breath and wheezing.   Cardiovascular: Negative for chest pain, palpitations and leg swelling.  Gastrointestinal: Negative for nausea, vomiting, abdominal pain, diarrhea and constipation.  Endocrine: Negative for cold intolerance, heat intolerance, polydipsia, polyphagia and polyuria.       Has been treated for hypothyroidism  Genitourinary: Negative for dysuria, urgency, frequency and flank pain.  Musculoskeletal: Positive for back pain,  arthralgias (left arm) and gait problem.  Skin: Negative for pallor, rash and wound.  Allergic/Immunologic: Negative.   Neurological: Negative for tremors, syncope, speech difficulty, weakness, numbness and headaches.  Hematological: Negative.   Psychiatric/Behavioral: Positive for confusion (occasionally) and sleep disturbance. Negative for hallucinations, behavioral problems, dysphoric mood and agitation. The patient is not nervous/anxious.        Objective:   Physical Exam  Constitutional: She is oriented to person, place, and time. She appears well-developed and well-nourished.  HENT:  Head: Normocephalic and atraumatic.  Eyes: Conjunctivae and EOM are normal. Pupils are equal, round, and reactive to light.  Neck: Normal range of motion. Neck supple. No JVD present. No thyromegaly present.  Cardiovascular: Normal rate, regular rhythm and normal heart sounds.   No murmur heard. Pulmonary/Chest: Effort normal and breath sounds normal. No respiratory distress. She has no wheezes. She has no rales.  Abdominal: Soft. Bowel sounds are normal. There is no tenderness.  Musculoskeletal: Normal range of motion. She exhibits no edema and no tenderness.  Lymphadenopathy:    She has no cervical adenopathy.  Neurological: She is alert and oriented to person, place, and time. She displays normal reflexes. No cranial nerve deficit. She exhibits normal muscle tone. Coordination normal.  Skin: Skin is warm and dry. No rash noted. No erythema.  Psychiatric: Her mood appears not anxious. Her affect is not angry, not labile and not inappropriate. Her speech is not rapid and/or pressured, not delayed and not slurred. She is not agitated, not aggressive, not slowed and not withdrawn. Thought content is not paranoid and not delusional. She does not express impulsivity or inappropriate judgment. She exhibits a depressed mood. She exhibits abnormal recent memory.     LABS REVIEWED: 03/31/12 Na 139, K 3.5,  glucose 115, Bun 17, creatinine 0.53, Ca 8.3  AST 29, ALT 20, Alk phos 38, Bilitot 1.0, protein 5.5, albumin 3.0  wbc 9.0, Hgb 11.6, Hct 33.4, plt 134 04/06/12 CBC wbc 7.6, Hgb 12.1, Hct 35.8, plt 244  CMP na 135, K 4.3, glucose 106, Bun 18, creatinine 0.59, Ca 8.5, LFT wnl except total protein 5.7, albumin 3.3  TSH 6.594  Vit D 25 06/04/12 BMP Na 137, K 4.0, glucose 104, Bun 19, creatinine 0.51, Ca 8.6  TSH 3.571 08/17/12 TSH 5.891      Assessment & Plan:    . Subarachnoid hemorrhage following injury stable-stable   Hypertension Mild elevated SBP 140-150 sometimes--asymptomatic, continue Amlodipine 10mg , Metoprolol 50mg  bid, and Losartan 100mg  daily  . Insomnia, unspecified  Better with  Mirtazapine 7.5mg  nightly continue Ambien 5mg  prn.    Hypothyroidism: elevated TSH 08/17/12--increase Levothyroxine to daily, f/u TSH in 12 weeks.

## 2012-08-20 DIAGNOSIS — Z9181 History of falling: Secondary | ICD-10-CM | POA: Diagnosis not present

## 2012-08-20 DIAGNOSIS — R269 Unspecified abnormalities of gait and mobility: Secondary | ICD-10-CM | POA: Diagnosis not present

## 2012-08-23 DIAGNOSIS — Z9181 History of falling: Secondary | ICD-10-CM | POA: Diagnosis not present

## 2012-08-23 DIAGNOSIS — R269 Unspecified abnormalities of gait and mobility: Secondary | ICD-10-CM | POA: Diagnosis not present

## 2012-08-26 DIAGNOSIS — R269 Unspecified abnormalities of gait and mobility: Secondary | ICD-10-CM | POA: Diagnosis not present

## 2012-08-26 DIAGNOSIS — Z9181 History of falling: Secondary | ICD-10-CM | POA: Diagnosis not present

## 2012-08-27 DIAGNOSIS — R269 Unspecified abnormalities of gait and mobility: Secondary | ICD-10-CM | POA: Diagnosis not present

## 2012-08-27 DIAGNOSIS — Z9181 History of falling: Secondary | ICD-10-CM | POA: Diagnosis not present

## 2012-09-11 ENCOUNTER — Non-Acute Institutional Stay: Payer: Medicare Other | Admitting: Nurse Practitioner

## 2012-09-11 DIAGNOSIS — L84 Corns and callosities: Secondary | ICD-10-CM | POA: Diagnosis not present

## 2012-09-11 DIAGNOSIS — E039 Hypothyroidism, unspecified: Secondary | ICD-10-CM | POA: Diagnosis not present

## 2012-09-11 DIAGNOSIS — K5900 Constipation, unspecified: Secondary | ICD-10-CM | POA: Insufficient documentation

## 2012-09-11 DIAGNOSIS — I10 Essential (primary) hypertension: Secondary | ICD-10-CM | POA: Diagnosis not present

## 2012-09-11 NOTE — Assessment & Plan Note (Addendum)
Plantar aspect of the left MTJ--pared

## 2012-09-11 NOTE — Assessment & Plan Note (Signed)
Stable on Senokot S II po nightly.

## 2012-09-11 NOTE — Assessment & Plan Note (Signed)
Controlled on Amlodipine 10mg  and Losartan 100mg  and Metoprolol 50mg  bid

## 2012-09-11 NOTE — Progress Notes (Signed)
Patient ID: Melissa Carroll, female   DOB: 03/27/1917, 79 y.o.   MRN: [id number]  Chief Complaint:  Chief Complaint  Patient presents with  . Medical Managment of Chronic Issues    corn plantar aspect of L MTJ     HPI:    Problem List Items Addressed This Visit     ICD-9-CM   Hypertension     Controlled on Amlodipine 10mg  and Losartan 100mg  and Metoprolol 50mg  bid    Unspecified hypothyroidism     Increased Levothyroxine to 08/18/12, f/u TSH in 12 weeks scheduled.       Corn or callus - Primary     Plantar aspect of the left MTJ--pared     Unspecified constipation     Stable on Senokot S II po nightly.        Review of Systems:  Review of Systems  Constitutional: Negative for fever, chills, weight loss, malaise/fatigue and diaphoresis.  HENT: Positive for hearing loss. Negative for ear pain, congestion, sore throat and neck pain.   Eyes: Negative for blurred vision, double vision, photophobia, pain, discharge and redness.  Respiratory: Negative for cough, sputum production and wheezing.   Cardiovascular: Negative for chest pain, orthopnea, claudication, leg swelling and PND.  Gastrointestinal: Negative for heartburn, nausea, vomiting, abdominal pain, diarrhea, constipation and blood in stool.  Genitourinary: Negative for dysuria, urgency, frequency, hematuria and flank pain.  Musculoskeletal: Positive for back pain. Negative for myalgias, joint pain and falls.  Skin: Negative for itching and rash.       Left plantar aspect of 1st MTJ corn--irritated  Neurological: Negative for dizziness, tingling, tremors, sensory change, speech change, focal weakness, loss of consciousness and weakness.  Endo/Heme/Allergies: Negative for environmental allergies and polydipsia. Does not bruise/bleed easily.  Psychiatric/Behavioral: Positive for memory loss. Negative for depression and hallucinations. The patient is not nervous/anxious and does not have insomnia.       Medications: Patient's Medications  New Prescriptions   No medications on file  Previous Medications   ACETAMINOPHEN (TYLENOL) 500 MG TABLET    Take 500 mg by mouth every 6 (six) hours as needed. Pain   AMLODIPINE (NORVASC) 10 MG TABLET    Take 10 mg by mouth daily.   ASPIRIN EC 81 MG TABLET    Take 81 mg by mouth daily.   BETA CAROTENE W/MINERALS (OCUVITE) TABLET    Take 1 tablet by mouth 2 (two) times daily.    CHOLECALCIFEROL (VITAMIN D) 1000 UNITS TABLET    Take 1,000 Units by mouth daily.   LEVOTHYROXINE (SYNTHROID, LEVOTHROID) 50 MCG TABLET    Take by mouth daily   LOSARTAN (COZAAR) 100 MG TABLET    Take 100 mg by mouth daily.   METOPROLOL (LOPRESSOR) 50 MG TABLET    Take 50 mg by mouth 2 (two) times daily.   MULTIPLE VITAMIN (MULTIVITAMIN WITH MINERALS) TABS    Take 1 tablet by mouth daily.   ONDANSETRON (ZOFRAN) 4 MG TABLET    Take 1 tablet (4 mg total) by mouth every 6 (six) hours as needed for nausea.   OXYCODONE-ACETAMINOPHEN (PERCOCET/ROXICET) 5-325 MG PER TABLET    Take 0.5-1 tablets by mouth every 6 (six) hours as needed for pain.   RALOXIFENE (EVISTA) 60 MG TABLET    Take 60 mg by mouth daily.   SENNA-DOCUSATE (SENOKOT-S) 8.6-50 MG PER TABLET    Take 2 tablets by mouth at bedtime.   ZOLPIDEM (AMBIEN) 5 MG TABLET    Take one tablet  by mouth at bedtime as needed for insomnia.  Modified Medications   No medications on file  Discontinued Medications   No medications on file     Physical Exam: Physical Exam  Constitutional: She is oriented to person, place, and time. She appears well-developed and well-nourished.  HENT:  Head: Normocephalic and atraumatic.  Eyes: Conjunctivae and EOM are normal. Pupils are equal, round, and reactive to light.  Neck: Normal range of motion. Neck supple. No JVD present. No thyromegaly present.  Cardiovascular: Normal rate, regular rhythm and normal heart sounds.   No murmur heard. Pulmonary/Chest: Effort normal and breath sounds  normal. No respiratory distress. She has no wheezes. She has no rales.  Abdominal: Soft. Bowel sounds are normal. There is no tenderness.  Musculoskeletal: Normal range of motion. She exhibits no edema and no tenderness.  Lymphadenopathy:    She has no cervical adenopathy.  Neurological: She is alert and oriented to person, place, and time. She displays normal reflexes. No cranial nerve deficit. She exhibits normal muscle tone. Coordination normal.  Skin: Skin is warm and dry. No rash noted. No erythema.  A irritated corn pared at the plantar aspect of the left 1st MTJ  Psychiatric: Her mood appears not anxious. Her affect is not angry, not labile and not inappropriate. Her speech is not rapid and/or pressured, not delayed and not slurred. She is not agitated, not aggressive, not slowed and not withdrawn. Thought content is not paranoid and not delusional. She does not express impulsivity or inappropriate judgment. She exhibits a depressed mood. She exhibits abnormal recent memory.     Filed Vitals:   09/08/12 1400  BP: 134/78  Pulse: 62  Temp: 98.4 F (36.9 C)  TempSrc: Tympanic  Resp: 20      Labs reviewed: Basic Metabolic Panel:  Recent Labs  16/10/96 2225 03/31/12 0515 06/04/12 08/17/12  NA 137 139 137  --   K 3.1* 3.5 4.0  --   CL 101 104  --   --   CO2 24 24  --   --   GLUCOSE 178* 115*  --   --   BUN 23 17 10   --   CREATININE 0.60 0.53 0.5  --   CALCIUM 9.0 8.3*  --   --   TSH  --   --  3.57 5.89    Liver Function Tests:  Recent Labs  03/31/12 0515  AST 29  ALT 20  ALKPHOS 38*  BILITOT 1.0  PROT 5.5*  ALBUMIN 3.0*    CBC:  Recent Labs  03/30/12 2225 03/31/12 0515  WBC 9.6 9.0  NEUTROABS 6.5  --   HGB 12.6 11.6*  HCT 36.6 33.4*  MCV 95.6 95.7  PLT 147* 134*    Anemia Panel: No results found for this basename: IRON, FOLATE, VITAMINB12,  in the last 8760 hours  Significant Diagnostic Results:     Assessment/Plan Corn or callus Plantar  aspect of the left MTJ--pared   Unspecified hypothyroidism Increased Levothyroxine to 08/18/12, f/u TSH in 12 weeks scheduled.     Hypertension Controlled on Amlodipine 10mg  and Losartan 100mg  and Metoprolol 50mg  bid  Unspecified constipation Stable on Senokot S II po nightly.       Family/ staff Communication: none   Goals of care: AL   Labs/tests ordered BMP and TSH

## 2012-09-11 NOTE — Assessment & Plan Note (Signed)
Increased Levothyroxine to 08/18/12, f/u TSH in 12 weeks scheduled.

## 2012-09-14 DIAGNOSIS — I10 Essential (primary) hypertension: Secondary | ICD-10-CM | POA: Diagnosis not present

## 2012-09-16 DIAGNOSIS — Z9181 History of falling: Secondary | ICD-10-CM | POA: Diagnosis not present

## 2012-09-16 DIAGNOSIS — R269 Unspecified abnormalities of gait and mobility: Secondary | ICD-10-CM | POA: Diagnosis not present

## 2012-09-16 DIAGNOSIS — R29818 Other symptoms and signs involving the nervous system: Secondary | ICD-10-CM | POA: Diagnosis not present

## 2012-09-22 DIAGNOSIS — H353290 Exudative age-related macular degeneration, unspecified eye, stage unspecified: Secondary | ICD-10-CM | POA: Diagnosis not present

## 2012-09-22 DIAGNOSIS — H35359 Cystoid macular degeneration, unspecified eye: Secondary | ICD-10-CM | POA: Diagnosis not present

## 2012-09-22 DIAGNOSIS — H3530 Unspecified macular degeneration: Secondary | ICD-10-CM | POA: Diagnosis not present

## 2012-09-22 DIAGNOSIS — H35059 Retinal neovascularization, unspecified, unspecified eye: Secondary | ICD-10-CM | POA: Diagnosis not present

## 2012-10-14 DIAGNOSIS — Z9181 History of falling: Secondary | ICD-10-CM | POA: Diagnosis not present

## 2012-10-14 DIAGNOSIS — M6281 Muscle weakness (generalized): Secondary | ICD-10-CM | POA: Diagnosis not present

## 2012-11-03 DIAGNOSIS — H3530 Unspecified macular degeneration: Secondary | ICD-10-CM | POA: Diagnosis not present

## 2012-11-03 DIAGNOSIS — H35359 Cystoid macular degeneration, unspecified eye: Secondary | ICD-10-CM | POA: Diagnosis not present

## 2012-11-03 DIAGNOSIS — H35059 Retinal neovascularization, unspecified, unspecified eye: Secondary | ICD-10-CM | POA: Diagnosis not present

## 2012-11-09 DIAGNOSIS — I10 Essential (primary) hypertension: Secondary | ICD-10-CM | POA: Diagnosis not present

## 2012-11-09 DIAGNOSIS — E039 Hypothyroidism, unspecified: Secondary | ICD-10-CM | POA: Diagnosis not present

## 2012-11-09 LAB — BASIC METABOLIC PANEL
Glucose: 89 mg/dL
Potassium: 4 mmol/L (ref 3.4–5.3)
Sodium: 138 mmol/L (ref 137–147)

## 2012-11-24 DIAGNOSIS — H353290 Exudative age-related macular degeneration, unspecified eye, stage unspecified: Secondary | ICD-10-CM | POA: Diagnosis not present

## 2012-12-04 ENCOUNTER — Non-Acute Institutional Stay: Payer: Medicare Other | Admitting: Nurse Practitioner

## 2012-12-04 ENCOUNTER — Encounter: Payer: Self-pay | Admitting: Nurse Practitioner

## 2012-12-04 DIAGNOSIS — G4700 Insomnia, unspecified: Secondary | ICD-10-CM | POA: Diagnosis not present

## 2012-12-04 DIAGNOSIS — I10 Essential (primary) hypertension: Secondary | ICD-10-CM

## 2012-12-04 DIAGNOSIS — E039 Hypothyroidism, unspecified: Secondary | ICD-10-CM | POA: Diagnosis not present

## 2012-12-04 NOTE — Assessment & Plan Note (Signed)
Not well controlled, takes Remeron 7.5mg  qhs at 8pm, the patient then later requests prn Ambien nightly for insomnia. It appears that the Remeron is not effective to treat the insomnia--will taper Remeron to 7.5mg  po qod for 2 weeks, then d/c. Change Ambien to 5mg  po qhs.

## 2012-12-04 NOTE — Assessment & Plan Note (Signed)
Increased Levothyroxine to 08/18/12, f/u TSH 3.86 11/09/12

## 2012-12-04 NOTE — Assessment & Plan Note (Signed)
Controlled on Amlodipine 10mg  and Losartan 100mg  and Metoprolol 50mg  bid

## 2012-12-04 NOTE — Progress Notes (Signed)
Patient ID: Melissa Carroll, female   DOB: 1916/10/08, 79 y.o.   MRN: [id number] Code Status: Full Code  No Known Allergies  Chief Complaint  Patient presents with  . Medical Managment of Chronic Issues    insomnia    HPI: Patient is a 79 y.o. female seen in the AL at [facility]  today for evaluation of insomnia and other chronic medical conditions.  Problem List Items Addressed This Visit   Hypertension     Controlled on Amlodipine 10mg  and Losartan 100mg  and Metoprolol 50mg  bid      Insomnia, unspecified - Primary     Not well controlled, takes Remeron 7.5mg  qhs at 8pm, the patient then later requests prn Ambien nightly for insomnia. It appears that the Remeron is not effective to treat the insomnia--will taper Remeron to 7.5mg  po qod for 2 weeks, then d/c. Change Ambien to 5mg  po qhs.     Unspecified hypothyroidism     Increased Levothyroxine to 08/18/12, f/u TSH 3.86 11/09/12           Review of Systems: Review of Systems  Constitutional: Negative for fever, chills, weight loss, malaise/fatigue and diaphoresis.  HENT: Positive for hearing loss. Negative for ear pain, congestion, sore throat and neck pain.   Eyes: Negative for blurred vision, double vision, photophobia, pain, discharge and redness.  Respiratory: Negative for cough, sputum production and wheezing.   Cardiovascular: Negative for chest pain, orthopnea, claudication, leg swelling and PND.  Gastrointestinal: Negative for heartburn, nausea, vomiting, abdominal pain, diarrhea, constipation and blood in stool.  Genitourinary: Negative for dysuria, urgency, frequency, hematuria and flank pain.  Musculoskeletal: Positive for back pain. Negative for myalgias, joint pain and falls.  Skin: Negative for itching and rash.       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   Endo/Heme/Allergies: Negative for environmental allergies and polydipsia. Does not bruise/bleed easily.  Psychiatric/Behavioral: Positive for memory loss. Negative for depression and hallucinations. The patient has insomnia. The patient is not nervous/anxious.   \  Past Medical History  Diagnosis Date  . Arthritis   . Osteoarthritis   . Hypertension   . Cataracts, bilateral    Past Surgical History  Procedure Laterality Date  . Ankle fracture surgery    . Abdominal hysterectomy    . Closed reduction with humeral pin insertion  04/01/2012    Procedure: CLOSED REDUCTION WITH HUMERAL PIN INSERTION;  Surgeon: Marlowe Shores, MD;  Location: [facility];  Service: Orthopedics;  Laterality: Left;   Social History:   reports that she has never smoked. She does not have any smokeless tobacco history on file. She reports that she does not drink alcohol or use illicit drugs.  History reviewed. No pertinent family history.  Medications: Reviewed at [facility]   Physical Exam: Physical Exam  Constitutional: She is oriented to person, place, and time. She appears well-developed and well-nourished.  HENT:  Head: Normocephalic and atraumatic.  Eyes: Conjunctivae and EOM are normal. Pupils are equal, round, and reactive to light.  Neck: Normal range of motion. Neck supple.  Cardiovascular:  No murmur heard. Pulmonary/Chest: Effort normal and breath sounds normal.  Abdominal: Soft. Bowel sounds are normal.  Musculoskeletal: Normal range of motion. She exhibits no edema and no tenderness.  Neurological: She is alert and oriented to person, place, and time. She displays normal reflexes. No cranial nerve deficit.  She exhibits normal muscle tone. Coordination normal.  Skin: Skin is warm and dry. No rash noted. No erythema.  Psychiatric: Her mood appears not anxious. Her affect is not angry, not labile and not inappropriate. Her speech is not rapid and/or pressured, not delayed and not slurred. She is  not agitated, not aggressive, not slowed and not withdrawn. Thought content is not paranoid and not delusional. Cognition and memory are impaired. She does not express impulsivity or inappropriate judgment. Depressed: flat affect. She exhibits abnormal recent memory.    Filed Vitals:   12/04/12 1342  BP: 130/62  Pulse: 64  Temp: 97.5 F (36.4 C)  TempSrc: Tympanic  Resp: 18      Labs reviewed: Basic Metabolic Panel:  Recent Labs  16/10/96 2225 03/31/12 0515 06/04/12 08/17/12 11/09/12  NA 137 139 137  --  138  K 3.1* 3.5 4.0  --  4.0  CL 101 104  --   --   --   CO2 24 24  --   --   --   GLUCOSE 178* 115*  --   --   --   BUN 23 17 10   --  18  CREATININE 0.60 0.53 0.5  --  0.6  CALCIUM 9.0 8.3*  --   --   --   TSH  --   --  3.57 5.89 3.86   Liver Function Tests:  Recent Labs  03/31/12 0515  AST 29  ALT 20  ALKPHOS 38*  BILITOT 1.0  PROT 5.5*  ALBUMIN 3.0*    CBC:  Recent Labs  03/30/12 2225 03/31/12 0515  WBC 9.6 9.0  NEUTROABS 6.5  --   HGB 12.6 11.6*  HCT 36.6 33.4*  MCV 95.6 95.7  PLT 147* 134*       Assessment/Plan Insomnia, unspecified Not well controlled, takes Remeron 7.5mg  qhs at 8pm, the patient then later requests prn Ambien nightly for insomnia. It appears that the Remeron is not effective to treat the insomnia--will taper Remeron to 7.5mg  po qod for 2 weeks, then d/c. Change Ambien to 5mg  po qhs.   Unspecified hypothyroidism Increased Levothyroxine to 08/18/12, f/u TSH 3.86 11/09/12      Hypertension Controlled on Amlodipine 10mg  and Losartan 100mg  and Metoprolol 50mg  bid      Family/ Staff Communication: observe the patient.   Goals of Care: AL  Labs/tests ordered: none

## 2012-12-15 DIAGNOSIS — H3581 Retinal edema: Secondary | ICD-10-CM | POA: Diagnosis not present

## 2012-12-15 DIAGNOSIS — H348392 Tributary (branch) retinal vein occlusion, unspecified eye, stable: Secondary | ICD-10-CM | POA: Diagnosis not present

## 2012-12-29 DIAGNOSIS — M25529 Pain in unspecified elbow: Secondary | ICD-10-CM | POA: Diagnosis not present

## 2012-12-29 DIAGNOSIS — M6281 Muscle weakness (generalized): Secondary | ICD-10-CM | POA: Diagnosis not present

## 2013-01-01 DIAGNOSIS — M6281 Muscle weakness (generalized): Secondary | ICD-10-CM | POA: Diagnosis not present

## 2013-01-01 DIAGNOSIS — M25529 Pain in unspecified elbow: Secondary | ICD-10-CM | POA: Diagnosis not present

## 2013-01-05 DIAGNOSIS — M6281 Muscle weakness (generalized): Secondary | ICD-10-CM | POA: Diagnosis not present

## 2013-01-05 DIAGNOSIS — M25529 Pain in unspecified elbow: Secondary | ICD-10-CM | POA: Diagnosis not present

## 2013-01-08 DIAGNOSIS — M25529 Pain in unspecified elbow: Secondary | ICD-10-CM | POA: Diagnosis not present

## 2013-01-08 DIAGNOSIS — M6281 Muscle weakness (generalized): Secondary | ICD-10-CM | POA: Diagnosis not present

## 2013-01-20 DIAGNOSIS — Z029 Encounter for administrative examinations, unspecified: Secondary | ICD-10-CM

## 2013-01-26 ENCOUNTER — Non-Acute Institutional Stay: Payer: Medicare Other | Admitting: Nurse Practitioner

## 2013-01-26 ENCOUNTER — Encounter: Payer: Self-pay | Admitting: Nurse Practitioner

## 2013-01-26 DIAGNOSIS — G4700 Insomnia, unspecified: Secondary | ICD-10-CM

## 2013-01-26 DIAGNOSIS — K5900 Constipation, unspecified: Secondary | ICD-10-CM | POA: Diagnosis not present

## 2013-01-26 DIAGNOSIS — I10 Essential (primary) hypertension: Secondary | ICD-10-CM | POA: Diagnosis not present

## 2013-01-26 DIAGNOSIS — H35359 Cystoid macular degeneration, unspecified eye: Secondary | ICD-10-CM | POA: Diagnosis not present

## 2013-01-26 DIAGNOSIS — W19XXXA Unspecified fall, initial encounter: Secondary | ICD-10-CM

## 2013-01-26 DIAGNOSIS — H353290 Exudative age-related macular degeneration, unspecified eye, stage unspecified: Secondary | ICD-10-CM | POA: Diagnosis not present

## 2013-01-26 DIAGNOSIS — W19XXXD Unspecified fall, subsequent encounter: Secondary | ICD-10-CM

## 2013-01-26 DIAGNOSIS — E039 Hypothyroidism, unspecified: Secondary | ICD-10-CM | POA: Diagnosis not present

## 2013-01-26 DIAGNOSIS — H35059 Retinal neovascularization, unspecified, unspecified eye: Secondary | ICD-10-CM | POA: Diagnosis not present

## 2013-01-26 NOTE — Assessment & Plan Note (Signed)
Sleeps with  Ambien 5mg  po qhs.

## 2013-01-26 NOTE — Assessment & Plan Note (Signed)
Not new--no apparent injury. The patient has increased frailty, ambulates with walker, taking Ambien nightly--all may contribute to her falling. Failed Ambien dose reduction and Remeron in the past. Close supervision needed.

## 2013-01-26 NOTE — Assessment & Plan Note (Signed)
TakesLevothyroxine 08/18/12, f/u TSH 3.86 11/09/12

## 2013-01-26 NOTE — Assessment & Plan Note (Addendum)
Controlled on Amlodipine 10mg  and Losartan 100mg  and Metoprolol 50mg  bid. Blood pressure was elevated onset of her fall-176/88--160-150/60s is her baseline. 02/05/13 Bp 150/87 P 70, will increase Metoprolol to 75mg  bid for better blood pressure control. She denied chest pain, dizziness, palpitation, or vision change today. Will continue to monitor Bp/P daily.

## 2013-01-26 NOTE — Progress Notes (Signed)
Patient ID: Melissa Carroll, female   DOB: 01-24-1917, 79 y.o.   MRN: [id number] Code Status: Full Code  No Known Allergies  Chief Complaint  Patient presents with  . Medical Managment of Chronic Issues    s/p fall, blood pressure    HPI: Patient is a 79 y.o. female seen in the AL at [facility]  today for evaluation of insomnia and other chronic medical conditions.  Problem List Items Addressed This Visit   Fall     Not new--no apparent injury. The patient has increased frailty, ambulates with walker, taking Ambien nightly--all may contribute to her falling. Failed Ambien dose reduction and Remeron in the past. Close supervision needed.     Hypertension - Primary     Controlled on Amlodipine 10mg  and Losartan 100mg  and Metoprolol 50mg  bid. Blood pressure was elevated onset of her fall-176/88--160-150/60s is her baseline. 02/05/13 Bp 150/87 P 70, will increase Metoprolol to 75mg  bid for better blood pressure control. She denied chest pain, dizziness, palpitation, or vision change today. Will continue to monitor Bp/P daily.         Insomnia, unspecified     Sleeps with  Ambien 5mg  po qhs.       Unspecified constipation     Stable on Senokot S I nightly.     Unspecified hypothyroidism     TakesLevothyroxine 08/18/12, f/u TSH 3.86 11/09/12             Review of Systems: Review of Systems  Constitutional: Negative for fever, chills, weight loss, malaise/fatigue and diaphoresis.  HENT: Positive for hearing loss. Negative for ear pain, congestion, sore throat and neck pain.   Eyes: Negative for blurred vision, double vision, photophobia, pain, discharge and redness.  Respiratory: Negative for cough, sputum production and wheezing.   Cardiovascular: Negative for chest pain, orthopnea, claudication, leg swelling and PND.  Gastrointestinal: Negative for heartburn, nausea, vomiting, abdominal pain, diarrhea, constipation and blood in stool.  Genitourinary: Negative for  dysuria, urgency, frequency, hematuria and flank pain.  Musculoskeletal: Positive for back pain. Negative for myalgias, joint pain and falls.  Skin: Negative for itching and rash.       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  Endo/Heme/Allergies: Negative for environmental allergies and polydipsia. Does not bruise/bleed easily.  Psychiatric/Behavioral: Positive for memory loss. Negative for depression and hallucinations. The patient has insomnia. The patient is not nervous/anxious.   \  Past Medical History  Diagnosis Date  . Arthritis   . Osteoarthritis   . Hypertension   . Cataracts, bilateral    Past Surgical History  Procedure Laterality Date  . Ankle fracture surgery    . Abdominal hysterectomy    . Closed reduction with humeral pin insertion  04/01/2012    Procedure: CLOSED REDUCTION WITH HUMERAL PIN INSERTION;  Surgeon: Marlowe Shores, MD;  Location: [facility];  Service: Orthopedics;  Laterality: Left;   Social History:   reports that she has never smoked. She does not have any smokeless tobacco history on file. She reports that she does not drink alcohol or use illicit drugs.  No family history on file.  Medications: Reviewed at [facility]   Physical Exam: Physical Exam  Constitutional: She is oriented to person, place, and time. She appears well-developed and well-nourished.  HENT:  Head: Normocephalic and atraumatic.  Eyes: Conjunctivae and EOM are normal. Pupils are equal,  round, and reactive to light.  Neck: Normal range of motion. Neck supple.  Cardiovascular:  No murmur heard. Pulmonary/Chest: Effort normal and breath sounds normal.  Abdominal: Soft. Bowel sounds are normal.  Musculoskeletal: Normal range of motion. She exhibits no edema and no tenderness.  Neurological: She is alert and oriented to person, place, and  time. She displays normal reflexes. No cranial nerve deficit. She exhibits normal muscle tone. Coordination normal.  Skin: Skin is warm and dry. No rash noted. No erythema.  Plantar aspect of the L MTJ  Callous flatted since last pared.   Psychiatric: Her mood appears not anxious. Her affect is not angry, not labile and not inappropriate. Her speech is not rapid and/or pressured, not delayed and not slurred. She is not agitated, not aggressive, not slowed and not withdrawn. Thought content is not paranoid and not delusional. Cognition and memory are impaired. She does not express impulsivity or inappropriate judgment. Depressed: flat affect. She exhibits abnormal recent memory.    Filed Vitals:   01/26/13 1425  BP: 150/87  Pulse: 70  Temp: 96.8 F (36 C)  TempSrc: Tympanic  Resp: 16      Labs reviewed: Basic Metabolic Panel:  Recent Labs  45/40/98 2225 03/31/12 0515 06/04/12 08/17/12 11/09/12  NA 137 139 137  --  138  K 3.1* 3.5 4.0  --  4.0  CL 101 104  --   --   --   CO2 24 24  --   --   --   GLUCOSE 178* 115*  --   --   --   BUN 23 17 10   --  18  CREATININE 0.60 0.53 0.5  --  0.6  CALCIUM 9.0 8.3*  --   --   --   TSH  --   --  3.57 5.89 3.86   Liver Function Tests:  Recent Labs  03/31/12 0515  AST 29  ALT 20  ALKPHOS 38*  BILITOT 1.0  PROT 5.5*  ALBUMIN 3.0*    CBC:  Recent Labs  03/30/12 2225 03/31/12 0515  WBC 9.6 9.0  NEUTROABS 6.5  --   HGB 12.6 11.6*  HCT 36.6 33.4*  MCV 95.6 95.7  PLT 147* 134*       Assessment/Plan Hypertension Controlled on Amlodipine 10mg  and Losartan 100mg  and Metoprolol 50mg  bid. Blood pressure was elevated onset of her fall-176/88--160-150/60s is her baseline. 02/05/13 Bp 150/87 P 70, will increase Metoprolol to 75mg  bid for better blood pressure control. She denied chest pain, dizziness, palpitation, or vision change today. Will continue to monitor Bp/P daily.       Unspecified hypothyroidism TakesLevothyroxine  08/18/12, f/u TSH 3.86 11/09/12        Insomnia, unspecified Sleeps with  Ambien 5mg  po qhs.     Unspecified constipation Stable on Senokot S I nightly.   Fall Not new--no apparent injury. The patient has increased frailty, ambulates with walker, taking Ambien nightly--all may contribute to her falling. Failed Ambien dose reduction and Remeron in the past. Close supervision needed.     Family/ Staff Communication: observe the patient.   Goals of Care: AL  Labs/tests ordered: none

## 2013-01-26 NOTE — Assessment & Plan Note (Signed)
Stable on Senokot S I nightly  

## 2013-02-18 DIAGNOSIS — H353290 Exudative age-related macular degeneration, unspecified eye, stage unspecified: Secondary | ICD-10-CM | POA: Insufficient documentation

## 2013-02-18 DIAGNOSIS — H35059 Retinal neovascularization, unspecified, unspecified eye: Secondary | ICD-10-CM | POA: Diagnosis not present

## 2013-02-18 DIAGNOSIS — H35359 Cystoid macular degeneration, unspecified eye: Secondary | ICD-10-CM | POA: Diagnosis not present

## 2013-03-02 DIAGNOSIS — H3530 Unspecified macular degeneration: Secondary | ICD-10-CM | POA: Diagnosis not present

## 2013-03-02 DIAGNOSIS — H35059 Retinal neovascularization, unspecified, unspecified eye: Secondary | ICD-10-CM | POA: Diagnosis not present

## 2013-03-02 DIAGNOSIS — H35359 Cystoid macular degeneration, unspecified eye: Secondary | ICD-10-CM | POA: Diagnosis not present

## 2013-03-02 DIAGNOSIS — H353290 Exudative age-related macular degeneration, unspecified eye, stage unspecified: Secondary | ICD-10-CM | POA: Diagnosis not present

## 2013-03-26 ENCOUNTER — Non-Acute Institutional Stay: Payer: Medicare Other | Admitting: Nurse Practitioner

## 2013-03-26 ENCOUNTER — Encounter: Payer: Self-pay | Admitting: Nurse Practitioner

## 2013-03-26 DIAGNOSIS — I10 Essential (primary) hypertension: Secondary | ICD-10-CM | POA: Diagnosis not present

## 2013-03-26 DIAGNOSIS — G4700 Insomnia, unspecified: Secondary | ICD-10-CM | POA: Diagnosis not present

## 2013-03-26 DIAGNOSIS — K5900 Constipation, unspecified: Secondary | ICD-10-CM | POA: Diagnosis not present

## 2013-03-26 DIAGNOSIS — M1990 Unspecified osteoarthritis, unspecified site: Secondary | ICD-10-CM

## 2013-03-26 DIAGNOSIS — E039 Hypothyroidism, unspecified: Secondary | ICD-10-CM | POA: Diagnosis not present

## 2013-03-26 NOTE — Assessment & Plan Note (Signed)
The stated she has been taking Ambien 5mg  for years. She sleeps well except occasional sleeplessness that she takes Tylenol to help with pain. She declined discontinuation of Ambien and desires Tylenol as needed. Will continue to monitor for AR of Ambien.

## 2013-03-26 NOTE — Assessment & Plan Note (Addendum)
Controlled on Amlodipine 10mg  and Losartan 100mg  and Metoprolol 75mg  bid. Update CMP

## 2013-03-26 NOTE — Assessment & Plan Note (Addendum)
TakesLevothyroxine 08/18/12, f/u TSH 3.86 11/09/12. Update TSH and CBC

## 2013-03-26 NOTE — Assessment & Plan Note (Signed)
Stable on Senokot S I nightly  

## 2013-03-26 NOTE — Assessment & Plan Note (Signed)
Tylenol prn is adequate.

## 2013-03-26 NOTE — Progress Notes (Signed)
Patient ID: Melissa Carroll, female   DOB: Sep 11, 1916, 79 y.o.   MRN: [id number]  Code Status: Full Code  No Known Allergies  Chief Complaint  Patient presents with  . Medical Managment of Chronic Issues    insomnia    HPI: Patient is a 79 y.o. female seen in the AL at [facility]  today for evaluation of insomnia and other chronic medical conditions.  Problem List Items Addressed This Visit   Hypertension     Controlled on Amlodipine 10mg  and Losartan 100mg  and Metoprolol 75mg  bid. Update CMP          Insomnia, unspecified - Primary     The stated she has been taking Ambien 5mg  for years. She sleeps well except occasional sleeplessness that she takes Tylenol to help with pain. She declined discontinuation of Ambien and desires Tylenol as needed. Will continue to monitor for AR of Ambien.     Osteoarthrosis, unspecified whether generalized or localized, unspecified site     Tylenol prn is adequate.     Unspecified constipation     Stable on Senokot S I nightly.       Unspecified hypothyroidism     TakesLevothyroxine 08/18/12, f/u TSH 3.86 11/09/12. Update TSH and CBC               Review of Systems: Review of Systems  Constitutional: Negative for fever, chills, weight loss, malaise/fatigue and diaphoresis.  HENT: Positive for hearing loss. Negative for congestion, ear pain and sore throat.   Eyes: Negative for blurred vision, double vision, photophobia, pain, discharge and redness.  Respiratory: Negative for cough, sputum production and wheezing.   Cardiovascular: Negative for chest pain, orthopnea, claudication, leg swelling and PND.  Gastrointestinal: Negative for heartburn, nausea, vomiting, abdominal pain, diarrhea, constipation and blood in stool.  Genitourinary: Negative for dysuria, urgency, frequency, hematuria and flank pain.  Musculoskeletal: Positive for back pain. Negative for falls, joint pain, myalgias and neck pain.  Skin: Negative for  itching and rash.       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  Endo/Heme/Allergies: Negative for environmental allergies and polydipsia. Does not bruise/bleed easily.  Psychiatric/Behavioral: Positive for memory loss. Negative for depression and hallucinations. The patient has insomnia. The patient is not nervous/anxious.   \  Past Medical History  Diagnosis Date  . Arthritis   . Osteoarthritis   . Hypertension   . Cataracts, bilateral    Past Surgical History  Procedure Laterality Date  . Ankle fracture surgery    . Abdominal hysterectomy    . Closed reduction with humeral pin insertion  04/01/2012    Procedure: CLOSED REDUCTION WITH HUMERAL PIN INSERTION;  Surgeon: Marlowe Shores, MD;  Location: [facility];  Service: Orthopedics;  Laterality: Left;   Social History:   reports that she has never smoked. She does not have any smokeless tobacco history on file. She reports that she does not drink alcohol or use illicit drugs.  No family history on file.  Medications: Reviewed at [facility]   Physical Exam: Physical Exam  Constitutional: She is oriented to person, place, and time. She appears well-developed and well-nourished.  HENT:  Head: Normocephalic and atraumatic.  Eyes: Conjunctivae and EOM are normal. Pupils are equal, round, and reactive to light.  Neck: Normal range of motion. Neck supple.  Cardiovascular:  No murmur heard. Pulmonary/Chest: Effort normal  and breath sounds normal.  Abdominal: Soft. Bowel sounds are normal.  Musculoskeletal: Normal range of motion. She exhibits no edema and no tenderness.  Neurological: She is alert and oriented to person, place, and time. She displays normal reflexes. No cranial nerve deficit. She exhibits normal muscle tone. Coordination normal.  Skin: Skin is warm and dry. No rash noted. No  erythema.  Psychiatric: Her mood appears not anxious. Her affect is not angry, not labile and not inappropriate. Her speech is not rapid and/or pressured, not delayed and not slurred. She is not agitated, not aggressive, not slowed and not withdrawn. Thought content is not paranoid and not delusional. Cognition and memory are impaired. She does not express impulsivity or inappropriate judgment. Depressed: flat affect. She exhibits abnormal recent memory.    Filed Vitals:   03/26/13 1435  BP: 160/78  Pulse: 66  Temp: 97.4 F (36.3 C)  TempSrc: Tympanic  Resp: 16      Labs reviewed: Basic Metabolic Panel:  Recent Labs  16/10/96 2225 03/31/12 0515 06/04/12 08/17/12 11/09/12  NA 137 139 137  --  138  K 3.1* 3.5 4.0  --  4.0  CL 101 104  --   --   --   CO2 24 24  --   --   --   GLUCOSE 178* 115*  --   --   --   BUN 23 17 10   --  18  CREATININE 0.60 0.53 0.5  --  0.6  CALCIUM 9.0 8.3*  --   --   --   TSH  --   --  3.57 5.89 3.86   Liver Function Tests:  Recent Labs  03/31/12 0515  AST 29  ALT 20  ALKPHOS 38*  BILITOT 1.0  PROT 5.5*  ALBUMIN 3.0*    CBC:  Recent Labs  03/30/12 2225 03/31/12 0515  WBC 9.6 9.0  NEUTROABS 6.5  --   HGB 12.6 11.6*  HCT 36.6 33.4*  MCV 95.6 95.7  PLT 147* 134*       Assessment/Plan Insomnia, unspecified The stated she has been taking Ambien 5mg  for years. She sleeps well except occasional sleeplessness that she takes Tylenol to help with pain. She declined discontinuation of Ambien and desires Tylenol as needed. Will continue to monitor for AR of Ambien.   Hypertension Controlled on Amlodipine 10mg  and Losartan 100mg  and Metoprolol 75mg  bid. Update CMP        Unspecified hypothyroidism TakesLevothyroxine 08/18/12, f/u TSH 3.86 11/09/12. Update TSH and CBC          Unspecified constipation Stable on Senokot S I nightly.     Osteoarthrosis, unspecified whether generalized or localized, unspecified  site Tylenol prn is adequate.     Family/ Staff Communication: observe the patient.   Goals of Care: AL  Labs/tests ordered: CBC, CMP, TSH

## 2013-03-29 DIAGNOSIS — I10 Essential (primary) hypertension: Secondary | ICD-10-CM | POA: Diagnosis not present

## 2013-03-29 DIAGNOSIS — D649 Anemia, unspecified: Secondary | ICD-10-CM | POA: Diagnosis not present

## 2013-03-29 DIAGNOSIS — E039 Hypothyroidism, unspecified: Secondary | ICD-10-CM | POA: Diagnosis not present

## 2013-03-29 LAB — CBC AND DIFFERENTIAL
HCT: 36 % (ref 36–46)
Hemoglobin: 12.6 g/dL (ref 12.0–16.0)
Platelets: 153 K/µL (ref 150–399)
WBC: 5.9 10*3/mL

## 2013-03-29 LAB — HEPATIC FUNCTION PANEL
ALT: 14 U/L (ref 7–35)
AST: 13 U/L (ref 13–35)
Alkaline Phosphatase: 39 U/L (ref 25–125)
Bilirubin, Total: 0.7 mg/dL

## 2013-03-29 LAB — BASIC METABOLIC PANEL
BUN: 21 mg/dL (ref 4–21)
Creatinine: 0.6 mg/dL (ref 0.5–1.1)
GLUCOSE: 102 mg/dL
Potassium: 3.9 mmol/L (ref 3.4–5.3)
Sodium: 138 mmol/L (ref 137–147)

## 2013-03-29 LAB — TSH: TSH: 4.73 u[IU]/mL (ref 0.41–5.90)

## 2013-05-03 ENCOUNTER — Other Ambulatory Visit: Payer: Self-pay | Admitting: *Deleted

## 2013-05-10 ENCOUNTER — Other Ambulatory Visit: Payer: Self-pay | Admitting: *Deleted

## 2013-05-10 DIAGNOSIS — H31019 Macula scars of posterior pole (postinflammatory) (post-traumatic), unspecified eye: Secondary | ICD-10-CM | POA: Diagnosis not present

## 2013-05-10 DIAGNOSIS — Z961 Presence of intraocular lens: Secondary | ICD-10-CM | POA: Diagnosis not present

## 2013-05-10 DIAGNOSIS — H04129 Dry eye syndrome of unspecified lacrimal gland: Secondary | ICD-10-CM | POA: Diagnosis not present

## 2013-05-10 DIAGNOSIS — H3530 Unspecified macular degeneration: Secondary | ICD-10-CM | POA: Diagnosis not present

## 2013-05-10 MED ORDER — ZOLPIDEM TARTRATE 5 MG PO TABS: ORAL_TABLET | ORAL | Status: DC

## 2013-05-18 DIAGNOSIS — H35359 Cystoid macular degeneration, unspecified eye: Secondary | ICD-10-CM | POA: Diagnosis not present

## 2013-05-18 DIAGNOSIS — H353290 Exudative age-related macular degeneration, unspecified eye, stage unspecified: Secondary | ICD-10-CM | POA: Diagnosis not present

## 2013-05-18 DIAGNOSIS — H3530 Unspecified macular degeneration: Secondary | ICD-10-CM | POA: Diagnosis not present

## 2013-05-18 DIAGNOSIS — H35059 Retinal neovascularization, unspecified, unspecified eye: Secondary | ICD-10-CM | POA: Diagnosis not present

## 2013-06-03 DIAGNOSIS — M1710 Unilateral primary osteoarthritis, unspecified knee: Secondary | ICD-10-CM | POA: Diagnosis not present

## 2013-07-14 DIAGNOSIS — N390 Urinary tract infection, site not specified: Secondary | ICD-10-CM | POA: Diagnosis not present

## 2013-07-20 DIAGNOSIS — H353290 Exudative age-related macular degeneration, unspecified eye, stage unspecified: Secondary | ICD-10-CM | POA: Diagnosis not present

## 2013-07-20 DIAGNOSIS — H3530 Unspecified macular degeneration: Secondary | ICD-10-CM | POA: Diagnosis not present

## 2013-07-20 DIAGNOSIS — H35359 Cystoid macular degeneration, unspecified eye: Secondary | ICD-10-CM | POA: Diagnosis not present

## 2013-07-20 DIAGNOSIS — H35059 Retinal neovascularization, unspecified, unspecified eye: Secondary | ICD-10-CM | POA: Diagnosis not present

## 2013-08-09 DIAGNOSIS — M8448XA Pathological fracture, other site, initial encounter for fracture: Secondary | ICD-10-CM | POA: Insufficient documentation

## 2013-08-09 DIAGNOSIS — R079 Chest pain, unspecified: Secondary | ICD-10-CM | POA: Diagnosis not present

## 2013-08-09 DIAGNOSIS — M546 Pain in thoracic spine: Secondary | ICD-10-CM | POA: Diagnosis not present

## 2013-08-09 DIAGNOSIS — IMO0002 Reserved for concepts with insufficient information to code with codable children: Secondary | ICD-10-CM

## 2013-08-09 HISTORY — DX: Reserved for concepts with insufficient information to code with codable children: IMO0002

## 2013-08-10 ENCOUNTER — Non-Acute Institutional Stay: Payer: Medicare Other | Admitting: Internal Medicine

## 2013-08-10 ENCOUNTER — Encounter: Payer: Self-pay | Admitting: Internal Medicine

## 2013-08-10 VITALS — BP 146/66 | HR 92

## 2013-08-10 DIAGNOSIS — I10 Essential (primary) hypertension: Secondary | ICD-10-CM

## 2013-08-10 DIAGNOSIS — M810 Age-related osteoporosis without current pathological fracture: Secondary | ICD-10-CM | POA: Insufficient documentation

## 2013-08-10 DIAGNOSIS — W19XXXA Unspecified fall, initial encounter: Secondary | ICD-10-CM

## 2013-08-10 DIAGNOSIS — N390 Urinary tract infection, site not specified: Secondary | ICD-10-CM | POA: Diagnosis not present

## 2013-08-10 DIAGNOSIS — IMO0002 Reserved for concepts with insufficient information to code with codable children: Secondary | ICD-10-CM | POA: Diagnosis not present

## 2013-08-10 DIAGNOSIS — E039 Hypothyroidism, unspecified: Secondary | ICD-10-CM | POA: Diagnosis not present

## 2013-08-10 HISTORY — DX: Urinary tract infection, site not specified: N39.0

## 2013-08-10 NOTE — Progress Notes (Signed)
Patient ID: Melissa Carroll, female   DOB: [birth year], 80 y.o.   MRN: [id number]    Location:  [facility]   Place of Service: Clinic (12)  PCP: Estill Dooms, MD  Code Status: LIVING WILL, HCPOA   Extended Emergency Contact Information Primary Emergency Contact: [place] of [place] Phone: [phone] Mobile Phone: [phone] Relation: Daughter Secondary Emergency Contact: Jennet Maduro [place] of [place] Phone: [phone] Relation: Grandson  No Known Allergies  Chief Complaint  Patient presents with  . Medical Managment of Chronic Issues    blood pressure, thyroid, insomnia  . Fall    08/06/13    HPI:  [place] 08/06/13. Sustained large ecchymosis on the medial right arm. Xrays of the spine showed compression fractures of T7 and T12. She is not very tender there, and this is likely to be old fractures not related to this fall.  Urinary tract infection: E. Coli in early March 2015. Treated with Septra. Asymptomatic now.  Fall: history of falls. In 2013 she had a humeral fracture  Unspecified hypothyroidism: mild elevation in TSH last fall 2014.  Hypertension: controlled  Senile osteoporosis: unchanged      Past Medical History  Diagnosis Date  . Arthritis   . Osteoarthritis   . Hypertension   . Cataracts, bilateral   . Closed fracture of unspecified part of lower end of humerus   . Retinal neovascularization NOS   . Macular degeneration (senile) of retina, unspecified   . Insomnia, unspecified   . Unspecified hypothyroidism   . Unspecified hearing loss   . Osteoarthrosis, unspecified whether generalized or localized, unspecified site   . Senile osteoporosis   . Urinary tract infection 08/10/2013  . Vertebral fracture 08/09/2013    T7 10%, T12 70%     Past Surgical History  Procedure Laterality Date  . Ankle fracture surgery    . Abdominal hysterectomy    . Closed reduction with humeral pin insertion   04/01/2012    Procedure: CLOSED REDUCTION WITH HUMERAL PIN INSERTION;  Surgeon: Schuyler Amor, MD;  Location: [facility];  Service: Orthopedics;  Laterality: Left;     Social History: History   Social History  . Marital Status: Widowed    Spouse Name: N/A    Number of Children: N/A  . Years of Education: N/A   Social History Main Topics  . Smoking status: Never Smoker   . Smokeless tobacco: None  . Alcohol Use: No  . Drug Use: No  . Sexual Activity: No   Other Topics Concern  . None   Social History Narrative   Lives at [facility]   Widowed          Family History Family Status  Relation Status Death Age  . Daughter Alive    History reviewed. No pertinent family history.   Medications: Patient's Medications  New Prescriptions   No medications on file  Previous Medications   ACETAMINOPHEN (TYLENOL) 500 MG TABLET    Take 500 mg by mouth every 6 (six) hours as needed. Pain   AMLODIPINE (NORVASC) 10 MG TABLET    Take 10 mg by mouth daily.   ASPIRIN EC 81 MG TABLET    Take 81 mg by mouth daily.   BETA CAROTENE W/MINERALS (OCUVITE) TABLET    Take 1 tablet by mouth 2 (two) times daily.    CHOLECALCIFEROL (VITAMIN D) 1000 UNITS TABLET    Take 1,000 Units by mouth daily.   LEVOTHYROXINE (SYNTHROID,  LEVOTHROID) 50 MCG TABLET    Take by mouth daily   LOSARTAN (COZAAR) 100 MG TABLET    Take 100 mg by mouth daily.   METOPROLOL (LOPRESSOR) 50 MG TABLET    Take 50 mg by mouth. Take 75mg  twice daily   MULTIPLE VITAMIN (MULTIVITAMIN WITH MINERALS) TABS    Take 1 tablet by mouth daily.   POLYETHYL GLYCOL-PROPYL GLYCOL (SYSTANE) 0.4-0.3 % SOLN    Apply to eye. Two drops both eyes twice daily for dry eyes   RALOXIFENE (EVISTA) 60 MG TABLET    Take 60 mg by mouth daily.   SENNA-DOCUSATE (SENOKOT-S) 8.6-50 MG PER TABLET    Take 2 tablets by mouth at bedtime.   ZOLPIDEM (AMBIEN) 5 MG TABLET    Take one tablet by mouth at bedtime as needed for insomnia. Do not give before 9pm  Modified  Medications   No medications on file  Discontinued Medications   ONDANSETRON (ZOFRAN) 4 MG TABLET    Take 1 tablet (4 mg total) by mouth every 6 (six) hours as needed for nausea.   OXYCODONE-ACETAMINOPHEN (PERCOCET/ROXICET) 5-325 MG PER TABLET    Take 0.5-1 tablets by mouth every 6 (six) hours as needed for pain.    Immunization History  Administered Date(s) Administered  . Influenza-Unspecified 03/02/2013  . PPD Test 03/23/2012     Review of Systems  Constitutional: Negative for fever, chills, diaphoresis, activity change, appetite change, fatigue and unexpected weight change.  HENT: Positive for hearing loss. Negative for congestion, ear discharge, postnasal drip, rhinorrhea, trouble swallowing and voice change.   Eyes: Negative for pain, discharge, itching and visual disturbance.  Respiratory: Negative for cough, choking, chest tightness, shortness of breath and wheezing.   Cardiovascular: Negative for chest pain, palpitations and leg swelling.  Gastrointestinal: Negative for nausea, vomiting, abdominal pain, diarrhea and constipation.  Endocrine: Negative for cold intolerance, heat intolerance, polydipsia, polyphagia and polyuria.       Has been treated for hypothyroidism  Genitourinary: Negative for dysuria, urgency, frequency and flank pain.  Musculoskeletal: Positive for arthralgias (left arm), back pain and gait problem (using wheelchair). Negative for neck pain and neck stiffness.  Skin: Negative for pallor, rash and wound.       Ecchymosis right arm  Allergic/Immunologic: Negative.   Neurological: Negative for tremors, syncope, speech difficulty, weakness, numbness and headaches.  Hematological: Negative.   Psychiatric/Behavioral: Positive for confusion (occasionally) and sleep disturbance. Negative for hallucinations, behavioral problems, dysphoric mood and agitation. The patient is not nervous/anxious.       Filed Vitals:   08/10/13 1016  BP: 146/66  Pulse: 92    Physical Exam  Constitutional:  Frail, elderly  HENT:  Severe hearing loss  Eyes: Conjunctivae and EOM are normal.  Neck: No JVD present. No thyromegaly present.  Cardiovascular: Normal rate, regular rhythm, normal heart sounds and intact distal pulses.  Exam reveals no gallop and no friction rub.   No murmur heard. Respiratory: No respiratory distress. She has no wheezes. She has no rales. She exhibits no tenderness.  GI: She exhibits no distension and no mass. There is no tenderness.  Musculoskeletal: Normal range of motion. She exhibits no edema and no tenderness.  Lymphadenopathy:    She has no cervical adenopathy.  Neurological: She is alert. She has normal reflexes. No cranial nerve deficit. Coordination normal.  forgetful  Skin: No rash noted. There is erythema. No pallor.  Large ecchymosis medial right humereus  Psychiatric: She has a normal mood and affect.  Her behavior is normal. Thought content normal.       Labs reviewed: Nursing Home on 08/10/2013  Component Date Value Ref Range Status  . Hemoglobin 03/29/2013 12.6  12.0 - 16.0 g/dL Final  . HCT 03/29/2013 36  36 - 46 % Final  . Platelets 03/29/2013 153  150 - 399 K/L Final  . WBC 03/29/2013 5.9   Final  . Glucose 03/29/2013 102   Final  . BUN 03/29/2013 21  4 - 21 mg/dL Final  . Creatinine 03/29/2013 0.6  0.5 - 1.1 mg/dL Final  . Potassium 03/29/2013 3.9  3.4 - 5.3 mmol/L Final  . Sodium 03/29/2013 138  137 - 147 mmol/L Final  . Alkaline Phosphatase 03/29/2013 39  25 - 125 U/L Final  . ALT 03/29/2013 14  7 - 35 U/L Final  . AST 03/29/2013 13  13 - 35 U/L Final  . Bilirubin, Total 03/29/2013 0.7   Final  . TSH 03/29/2013 4.73  0.41 - 5.90 uIU/mL Final     Assessment/Plan 1. Vertebral fracture Likely old  2. Urinary tract infection treated  3. Fall 08/06/13  4. Unspecified hypothyroidism Elevated TSH when last checked  5. Hypertension controlled  6. Senile osteoporosis unchanged

## 2013-08-14 ENCOUNTER — Emergency Department (HOSPITAL_COMMUNITY): Payer: Medicare Other

## 2013-08-14 ENCOUNTER — Encounter (HOSPITAL_COMMUNITY): Payer: Self-pay | Admitting: Emergency Medicine

## 2013-08-14 ENCOUNTER — Inpatient Hospital Stay (HOSPITAL_COMMUNITY)
Admission: EM | Admit: 2013-08-14 | Discharge: 2013-08-15 | DRG: 439 | Disposition: A | Discharge status: Nursing Home (Includes ICF) | Payer: Medicare Other | Attending: Internal Medicine | Admitting: Internal Medicine

## 2013-08-14 DIAGNOSIS — S066XAA Traumatic subarachnoid hemorrhage with loss of consciousness status unknown, initial encounter: Secondary | ICD-10-CM

## 2013-08-14 DIAGNOSIS — H3530 Unspecified macular degeneration: Secondary | ICD-10-CM | POA: Diagnosis present

## 2013-08-14 DIAGNOSIS — E872 Acidosis, unspecified: Secondary | ICD-10-CM | POA: Diagnosis not present

## 2013-08-14 DIAGNOSIS — W19XXXA Unspecified fall, initial encounter: Secondary | ICD-10-CM

## 2013-08-14 DIAGNOSIS — K5900 Constipation, unspecified: Secondary | ICD-10-CM

## 2013-08-14 DIAGNOSIS — Z9181 History of falling: Secondary | ICD-10-CM | POA: Diagnosis not present

## 2013-08-14 DIAGNOSIS — R739 Hyperglycemia, unspecified: Secondary | ICD-10-CM | POA: Diagnosis present

## 2013-08-14 DIAGNOSIS — N390 Urinary tract infection, site not specified: Secondary | ICD-10-CM

## 2013-08-14 DIAGNOSIS — G4700 Insomnia, unspecified: Secondary | ICD-10-CM | POA: Diagnosis present

## 2013-08-14 DIAGNOSIS — K7689 Other specified diseases of liver: Secondary | ICD-10-CM | POA: Diagnosis not present

## 2013-08-14 DIAGNOSIS — Z7982 Long term (current) use of aspirin: Secondary | ICD-10-CM

## 2013-08-14 DIAGNOSIS — K859 Acute pancreatitis without necrosis or infection, unspecified: Principal | ICD-10-CM | POA: Diagnosis present

## 2013-08-14 DIAGNOSIS — M1990 Unspecified osteoarthritis, unspecified site: Secondary | ICD-10-CM | POA: Diagnosis present

## 2013-08-14 DIAGNOSIS — M810 Age-related osteoporosis without current pathological fracture: Secondary | ICD-10-CM | POA: Diagnosis present

## 2013-08-14 DIAGNOSIS — Z79899 Other long term (current) drug therapy: Secondary | ICD-10-CM | POA: Diagnosis not present

## 2013-08-14 DIAGNOSIS — T1490XA Injury, unspecified, initial encounter: Secondary | ICD-10-CM | POA: Diagnosis not present

## 2013-08-14 DIAGNOSIS — R7309 Other abnormal glucose: Secondary | ICD-10-CM | POA: Diagnosis not present

## 2013-08-14 DIAGNOSIS — R918 Other nonspecific abnormal finding of lung field: Secondary | ICD-10-CM | POA: Diagnosis not present

## 2013-08-14 DIAGNOSIS — E039 Hypothyroidism, unspecified: Secondary | ICD-10-CM | POA: Diagnosis not present

## 2013-08-14 DIAGNOSIS — R1013 Epigastric pain: Secondary | ICD-10-CM | POA: Diagnosis not present

## 2013-08-14 DIAGNOSIS — R109 Unspecified abdominal pain: Secondary | ICD-10-CM | POA: Diagnosis not present

## 2013-08-14 DIAGNOSIS — R079 Chest pain, unspecified: Secondary | ICD-10-CM | POA: Diagnosis not present

## 2013-08-14 DIAGNOSIS — IMO0001 Reserved for inherently not codable concepts without codable children: Secondary | ICD-10-CM | POA: Diagnosis not present

## 2013-08-14 DIAGNOSIS — H269 Unspecified cataract: Secondary | ICD-10-CM | POA: Diagnosis present

## 2013-08-14 DIAGNOSIS — E861 Hypovolemia: Secondary | ICD-10-CM | POA: Diagnosis present

## 2013-08-14 DIAGNOSIS — I10 Essential (primary) hypertension: Secondary | ICD-10-CM | POA: Diagnosis not present

## 2013-08-14 DIAGNOSIS — M7918 Myalgia, other site: Secondary | ICD-10-CM | POA: Diagnosis present

## 2013-08-14 DIAGNOSIS — Z66 Do not resuscitate: Secondary | ICD-10-CM | POA: Diagnosis present

## 2013-08-14 DIAGNOSIS — H9190 Unspecified hearing loss, unspecified ear: Secondary | ICD-10-CM

## 2013-08-14 DIAGNOSIS — IMO0002 Reserved for concepts with insufficient information to code with codable children: Secondary | ICD-10-CM

## 2013-08-14 DIAGNOSIS — E871 Hypo-osmolality and hyponatremia: Secondary | ICD-10-CM | POA: Diagnosis not present

## 2013-08-14 DIAGNOSIS — S066X9A Traumatic subarachnoid hemorrhage with loss of consciousness of unspecified duration, initial encounter: Secondary | ICD-10-CM

## 2013-08-14 DIAGNOSIS — R1012 Left upper quadrant pain: Secondary | ICD-10-CM | POA: Diagnosis not present

## 2013-08-14 HISTORY — DX: Acute pancreatitis without necrosis or infection, unspecified: K85.90

## 2013-08-14 HISTORY — DX: Traumatic subarachnoid hemorrhage with loss of consciousness of unspecified duration, initial encounter: S06.6X9A

## 2013-08-14 LAB — CBC WITH DIFFERENTIAL/PLATELET
Basophils Absolute: 0.1 10*3/uL (ref 0.0–0.1)
Basophils Relative: 1 % (ref 0–1)
EOS ABS: 0.3 10*3/uL (ref 0.0–0.7)
EOS PCT: 4 % (ref 0–5)
HEMATOCRIT: 43.9 % (ref 36.0–46.0)
HEMOGLOBIN: 15.3 g/dL — AB (ref 12.0–15.0)
LYMPHS PCT: 41 % (ref 12–46)
Lymphs Abs: 3.1 10*3/uL (ref 0.7–4.0)
MCH: 33.3 pg (ref 26.0–34.0)
MCHC: 34.9 g/dL (ref 30.0–36.0)
MCV: 95.6 fL (ref 78.0–100.0)
MONO ABS: 0.6 10*3/uL (ref 0.1–1.0)
MONOS PCT: 8 % (ref 3–12)
Neutro Abs: 3.6 10*3/uL (ref 1.7–7.7)
Neutrophils Relative %: 47 % (ref 43–77)
PLATELETS: 201 10*3/uL (ref 150–400)
RBC: 4.59 MIL/uL (ref 3.87–5.11)
RDW: 13.7 % (ref 11.5–15.5)
WBC: 7.7 10*3/uL (ref 4.0–10.5)

## 2013-08-14 LAB — COMPREHENSIVE METABOLIC PANEL
ALT: 22 U/L (ref 0–35)
AST: 22 U/L (ref 0–37)
Albumin: 3.7 g/dL (ref 3.5–5.2)
Alkaline Phosphatase: 61 U/L (ref 39–117)
BUN: 15 mg/dL (ref 6–23)
CALCIUM: 9.8 mg/dL (ref 8.4–10.5)
CO2: 24 mEq/L (ref 19–32)
CREATININE: 0.58 mg/dL (ref 0.50–1.10)
Chloride: 94 mEq/L — ABNORMAL LOW (ref 96–112)
GFR calc non Af Amer: 76 mL/min — ABNORMAL LOW (ref >90)
GFR, EST AFRICAN AMERICAN: 89 mL/min — AB (ref >90)
Glucose, Bld: 149 mg/dL — ABNORMAL HIGH (ref 70–99)
Potassium: 3.9 mEq/L (ref 3.7–5.3)
Sodium: 133 mEq/L — ABNORMAL LOW (ref 137–147)
TOTAL PROTEIN: 7.6 g/dL (ref 6.0–8.3)
Total Bilirubin: 0.9 mg/dL (ref 0.3–1.2)

## 2013-08-14 LAB — URINALYSIS, ROUTINE W REFLEX MICROSCOPIC
Bilirubin Urine: NEGATIVE
Glucose, UA: NEGATIVE mg/dL
Hgb urine dipstick: NEGATIVE
Ketones, ur: NEGATIVE mg/dL
Leukocytes, UA: NEGATIVE
Nitrite: NEGATIVE
PROTEIN: NEGATIVE mg/dL
SPECIFIC GRAVITY, URINE: 1.008 (ref 1.005–1.030)
UROBILINOGEN UA: 0.2 mg/dL (ref 0.0–1.0)
pH: 7 (ref 5.0–8.0)

## 2013-08-14 LAB — LACTIC ACID, PLASMA: LACTIC ACID, VENOUS: 3.3 mmol/L — AB (ref 0.5–2.2)

## 2013-08-14 LAB — LIPASE, BLOOD: LIPASE: 81 U/L — AB (ref 11–59)

## 2013-08-14 MED ORDER — POTASSIUM CHLORIDE IN NACL 20-0.9 MEQ/L-% IV SOLN
INTRAVENOUS | Status: DC
  Administered 2013-08-14: via INTRAVENOUS
  Filled 2013-08-14 (×3): qty 1000

## 2013-08-14 MED ORDER — SODIUM CHLORIDE 0.9 % IV BOLUS (SEPSIS): 500.0000 mL | Freq: Once | INTRAVENOUS | Status: DC

## 2013-08-14 MED ORDER — HEPARIN SODIUM (PORCINE) 5000 UNIT/ML IJ SOLN
5000.0000 [IU] | Freq: Three times a day (TID) | INTRAMUSCULAR | Status: DC
  Administered 2013-08-14 – 2013-08-15 (×2): 5000 [IU] via SUBCUTANEOUS
  Filled 2013-08-14 (×6): qty 1

## 2013-08-14 MED ORDER — HYDRALAZINE HCL 20 MG/ML IJ SOLN
5.0000 mg | INTRAMUSCULAR | Status: DC | PRN
  Filled 2013-08-14: qty 0.25

## 2013-08-14 MED ORDER — ONDANSETRON HCL 4 MG/2ML IJ SOLN: 4.0000 mg | Freq: Four times a day (QID) | INTRAMUSCULAR | Status: DC | PRN

## 2013-08-14 MED ORDER — PANTOPRAZOLE SODIUM 40 MG IV SOLR
40.0000 mg | Freq: Every day | INTRAVENOUS | Status: DC
  Administered 2013-08-14: 40 mg via INTRAVENOUS
  Filled 2013-08-14 (×2): qty 40

## 2013-08-14 MED ORDER — METOPROLOL TARTRATE 50 MG PO TABS: 75.0000 mg | ORAL_TABLET | Freq: Two times a day (BID) | ORAL | Status: DC

## 2013-08-14 MED ORDER — AMLODIPINE BESYLATE 10 MG PO TABS
10.0000 mg | ORAL_TABLET | Freq: Every day | ORAL | Status: DC
  Administered 2013-08-15: 10 mg via ORAL
  Filled 2013-08-14: qty 1

## 2013-08-14 MED ORDER — MORPHINE SULFATE 2 MG/ML IJ SOLN
2.0000 mg | INTRAMUSCULAR | Status: DC | PRN
  Administered 2013-08-14 – 2013-08-15 (×4): 2 mg via INTRAVENOUS
  Filled 2013-08-14 (×4): qty 1

## 2013-08-14 MED ORDER — ASPIRIN EC 81 MG PO TBEC
81.0000 mg | DELAYED_RELEASE_TABLET | Freq: Every day | ORAL | Status: DC
  Administered 2013-08-15: 81 mg via ORAL
  Filled 2013-08-14: qty 1

## 2013-08-14 MED ORDER — ONDANSETRON HCL 4 MG PO TABS: 4.0000 mg | ORAL_TABLET | Freq: Four times a day (QID) | ORAL | Status: DC | PRN

## 2013-08-14 MED ORDER — IOHEXOL 300 MG/ML  SOLN
50.0000 mL | Freq: Once | INTRAMUSCULAR | Status: AC | PRN
  Administered 2013-08-14: 50 mL via ORAL

## 2013-08-14 MED ORDER — METOPROLOL TARTRATE 50 MG PO TABS
75.0000 mg | ORAL_TABLET | Freq: Two times a day (BID) | ORAL | Status: DC
  Administered 2013-08-15: 75 mg via ORAL
  Filled 2013-08-14 (×2): qty 1

## 2013-08-14 MED ORDER — ACETAMINOPHEN 325 MG PO TABS
650.0000 mg | ORAL_TABLET | Freq: Four times a day (QID) | ORAL | Status: DC | PRN
  Administered 2013-08-15: 650 mg via ORAL
  Filled 2013-08-14: qty 2

## 2013-08-14 MED ORDER — POLYVINYL ALCOHOL 1.4 % OP SOLN
2.0000 [drp] | Freq: Two times a day (BID) | OPHTHALMIC | Status: DC
  Administered 2013-08-14 – 2013-08-15 (×2): 2 [drp] via OPHTHALMIC
  Filled 2013-08-14: qty 15

## 2013-08-14 MED ORDER — IOHEXOL 300 MG/ML  SOLN
100.0000 mL | Freq: Once | INTRAMUSCULAR | Status: AC | PRN
  Administered 2013-08-14: 100 mL via INTRAVENOUS

## 2013-08-14 MED ORDER — SENNOSIDES-DOCUSATE SODIUM 8.6-50 MG PO TABS
2.0000 | ORAL_TABLET | Freq: Every day | ORAL | Status: DC
  Filled 2013-08-14: qty 2

## 2013-08-14 MED ORDER — ACETAMINOPHEN 650 MG RE SUPP: 650.0000 mg | Freq: Four times a day (QID) | RECTAL | Status: DC | PRN

## 2013-08-14 MED ORDER — LEVOTHYROXINE SODIUM 50 MCG PO TABS
50.0000 ug | ORAL_TABLET | Freq: Every day | ORAL | Status: DC
  Administered 2013-08-15: 50 ug via ORAL
  Filled 2013-08-14 (×2): qty 1

## 2013-08-14 MED ORDER — LOSARTAN POTASSIUM 50 MG PO TABS
100.0000 mg | ORAL_TABLET | Freq: Every day | ORAL | Status: DC
  Administered 2013-08-15: 100 mg via ORAL
  Filled 2013-08-14: qty 2

## 2013-08-14 NOTE — H&P (Signed)
[facility] History and Physical  [name] QQV:[id number] DOB: 10/22/1916 DOA: 08/14/2013  Referring physician: ED MD Dr. Doy Mince PCP: Estill Dooms, MD   Chief Complaint: Abdominal pain  HPI: Melissa Carroll is a 80 y.o. female with a history of osteoarthritis, hypertension, recent vertebral fracture March 2015, and hypothyroidism, who presents from [facility] with a chief complaint of epigastric abdominal pain. The history is being provided by the patient and her grandson, Melissa Carroll. Accordingly, the patient fell approximately 1 week ago. Evaluation revealed compression fractures of T7 and T12. Since that time, she has had complaints of upper abdominal pain that radiates to the left and the right. She describes the pain as a hurting pain. At its worse, it was a 10 over 10 in intensity. Her family noticed that she has stayed in bed most of the day yesterday which was unlike her. Today, after eating a few bites of food, she developed more epigastric abdominal pain. She denies nausea, vomiting, diarrhea, constipation, or pain with urination. She says that she is "sore" now.  In the emergency department, she is afebrile and hemodynamically stable. Her lab data are significant for a lipase of 81, lactic acid of 3.3, glucose of 149, sodium of 133, normal WBC. Her urinalysis is unremarkable. Her chest x-ray reveals no acute abnormalities. CT of her abdomen and pelvis reveals chronic stable changes within the abdomen; mild hepatic steatosis; diffuse diverticulosis within the sigmoid colon; atelectasis versus scarring in the lung bases. She is being admitted for further evaluation and management.    Review of Systems:  As above in history present. In addition, she has chronic arthritic pain, chronic heart of hearing; otherwise review of systems is negative.  Past Medical History  Diagnosis Date  . Arthritis   . Osteoarthritis   . Hypertension   . Cataracts, bilateral   .  Closed fracture of unspecified part of lower end of humerus   . Retinal neovascularization NOS   . Macular degeneration (senile) of retina, unspecified   . Insomnia, unspecified   . Unspecified hypothyroidism   . Unspecified hearing loss   . Osteoarthrosis, unspecified whether generalized or localized, unspecified site   . Senile osteoporosis   . Urinary tract infection 08/10/2013  . Vertebral fracture 08/09/2013    T7 10%, T12 70%   . Subarachnoid hemorrhage following injury 03/31/2012   Past Surgical History  Procedure Laterality Date  . Ankle fracture surgery    . Abdominal hysterectomy    . Closed reduction with humeral pin insertion  04/01/2012    Procedure: CLOSED REDUCTION WITH HUMERAL PIN INSERTION;  Surgeon: Schuyler Amor, MD;  Location: [facility];  Service: Orthopedics;  Laterality: Left;   Social History: She is widowed. She is a resident of [facility]. Her daughter, [place], is her healthcare power of attorney. CODE STATUS confirmation pending. The patient ambulates with a walker. She denies tobacco, alcohol, and illicit drug use.  No Known Allergies  History reviewed. No pertinent family history.   Prior to Admission medications   Medication Sig Start Date End Date Taking? Authorizing Provider  acetaminophen (TYLENOL) 500 MG tablet Take 500 mg by mouth every 6 (six) hours as needed. Pain   Yes Historical Provider, MD  amLODipine (NORVASC) 10 MG tablet Take 10 mg by mouth daily.   Yes Historical Provider, MD  aspirin EC 81 MG tablet Take 81 mg by mouth daily.   Yes Historical Provider, MD  beta  carotene w/minerals (OCUVITE) tablet Take 1 tablet by mouth 2 (two) times daily.    Yes Historical Provider, MD  cholecalciferol (VITAMIN D) 1000 UNITS tablet Take 1,000 Units by mouth daily.   Yes Historical Provider, MD  levothyroxine (SYNTHROID, LEVOTHROID) 50 MCG tablet Take by mouth daily 08/18/12  Yes Man X Mast, NP  losartan (COZAAR) 100 MG tablet  Take 100 mg by mouth daily.   Yes Historical Provider, MD  Multiple Vitamin (MULTIVITAMIN WITH MINERALS) TABS Take 1 tablet by mouth daily.   Yes Historical Provider, MD  Polyethyl Glycol-Propyl Glycol (SYSTANE) 0.4-0.3 % SOLN Apply to eye. Two drops both eyes twice daily for dry eyes   Yes Historical Provider, MD  raloxifene (EVISTA) 60 MG tablet Take 60 mg by mouth daily.   Yes Historical Provider, MD  senna-docusate (SENOKOT-S) 8.6-50 MG per tablet Take 2 tablets by mouth at bedtime.   Yes Historical Provider, MD  traMADol (ULTRAM) 50 MG tablet Take 50 mg by mouth every 6 (six) hours as needed (pain).   Yes Historical Provider, MD  zolpidem (AMBIEN) 5 MG tablet Take one tablet by mouth at bedtime as needed for insomnia. Do not give before 9pm 05/10/13  Yes Tiffany L Reed, DO  metoprolol (LOPRESSOR) 50 MG tablet Take 50 mg by mouth. Take 75mg  twice daily    Historical Provider, MD   Physical Exam: Filed Vitals:   08/14/13 2134  BP: 157/84  Pulse: 89  Temp: 97.6 F (36.4 C)  Resp: 18    BP 157/84  Pulse 89  Temp(Src) 97.6 F (36.4 C) (Oral)  Resp 18  SpO2 94%  General:  Appears calm and comfortable Eyes: PERRL, normal lids, irises & conjunctiva ENT: Oropharynx with mucous membranes mildly dry. No exudates or erythema. Chronically hard of hearing. Neck: no LAD, masses or thyromegaly Cardiovascular: S1, S2, with no murmurs rubs gallops. Trace bilateral LE edema; several varicosities of the legs bilaterally.. Telemetry: Not applicable.  Respiratory: CTA bilaterally, no w/r/r. Normal respiratory effort. Abdomen: Obese, positive bowel sounds, soft, minimally tender in the epigastrium; no masses palpated; no hepatosplenomegaly. Skin: no rash or induration seen on limited exam Musculoskeletal: Arthritic hypertrophic changes seen in her knees. No acute hot joints. Mild tenderness to palpation over the intercostal muscles left greater than the right; no chest wall ecchymosis or  erythema. Psychiatric: She is alert and oriented to herself and hospital. Her speech is clear. Neurologic: grossly non-focal; except she is chronically hard of hearing. Otherwise, cranial nerves are grossly intact. Ambulation noted with walker in the room without significant abnormalities.           Labs on Admission:  Basic Metabolic Panel:  Recent Labs Lab 08/14/13 1605  NA 133*  K 3.9  CL 94*  CO2 24  GLUCOSE 149*  BUN 15  CREATININE 0.58  CALCIUM 9.8   Liver Function Tests:  Recent Labs Lab 08/14/13 1605  AST 22  ALT 22  ALKPHOS 61  BILITOT 0.9  PROT 7.6  ALBUMIN 3.7    Recent Labs Lab 08/14/13 1605  LIPASE 81*   No results found for this basename: AMMONIA,  in the last 168 hours CBC:  Recent Labs Lab 08/14/13 1605  WBC 7.7  NEUTROABS 3.6  HGB 15.3*  HCT 43.9  MCV 95.6  PLT 201   Cardiac Enzymes: No results found for this basename: CKTOTAL, CKMB, CKMBINDEX, TROPONINI,  in the last 168 hours  BNP (last 3 results) No results found for this basename: PROBNP,  in the last 8760 hours CBG: No results found for this basename: GLUCAP,  in the last 168 hours  Radiological Exams on Admission: Dg Chest 2 View  08/14/2013   CLINICAL DATA:  Epigastric pain  EXAM: CHEST  2 VIEW  COMPARISON:  03/30/2012  FINDINGS: Cardiac shadow is within normal limits. The lungs are clear bilaterally. Mild interstitial changes are again seen. No acute bony abnormality is noted. Compression deformities are noted.  IMPRESSION: No active cardiopulmonary disease.   Electronically Signed   By: Inez Catalina M.D.   On: 08/14/2013 16:08   Ct Abdomen Pelvis W Contrast  08/14/2013   CLINICAL DATA:  Abdominal pain  EXAM: CT ABDOMEN AND PELVIS WITH CONTRAST  TECHNIQUE: Multidetector CT imaging of the abdomen and pelvis was performed using the standard protocol following bolus administration of intravenous contrast.  CONTRAST:  2mL OMNIPAQUE IOHEXOL 300 MG/ML SOLN, 123mL OMNIPAQUE IOHEXOL 300  MG/ML SOLN  COMPARISON:  CT ABD/PELVIS W CM dated 11/08/2009  FINDINGS: Areas of interstitial prominence and mild increased density within the lung bases. Stable bleb medial left lung base.  Atherosclerotic calcifications within the aorta, mesenteric, and iliac vessels no evidence of abdominal aortic aneurysm.  Diffuse Carroll attenuation within the liver parenchyma otherwise negative. Gallbladder and gallbladder fossa unremarkable. The spleen, adrenals, pancreas are unremarkable. Small subcentimeter nonenhancing Carroll attenuating foci within the right and left kidneys, stable, consistent with multiple cysts. The kidneys otherwise unremarkable.  There is diffuse diverticulosis within the sigmoid colon. The bowel is otherwise negative.  No abdominal or pelvic masses, free fluid, loculated fluid collections, no adenopathy appreciated. Stable small left ovarian cyst.  Multilevel spondylosis within the thoracolumbar spine. Chronic compression deformity involving T12. No aggressive osseous lesions.  No abdominal wall or inguinal hernia appreciated.  IMPRESSION: 1. Atelectasis versus scarring within the lung bases. 2. Stable chronic findings within the abdomen without evidence of focal or acute abnormalities. No CT findings accounting for the patient's clinical presentation. 3. Mild hepatic steatosis   Electronically Signed   By: Margaree Mackintosh M.D.   On: 08/14/2013 19:02    EKG: Independently reviewed.   Assessment/Plan Principal Problem:   Abdominal pain Active Problems:   Pancreatitis, acute   Hypertension   Unspecified hypothyroidism   Musculoskeletal pain   Hyponatremia   Hyperglycemia   1. Epigastric abdominal pain. Etiology could be a combination of mild/Carroll grade acute pancreatitis and musculoskeletal pain from a recent fall. She was given oral tramadol in the emergency department with mild relief. We'll treat her pain with as needed IV morphine. Will start IV Protonix. We'll add as needed Zofran  although she denies nausea. Will provide IV fluid hydration. We'll minimize her oral medications overnight. We'll start clear liquids as she is requesting something to drink; I think this is reasonable given minimal abdominal tenderness and no nausea currently.Burnis Medin not advance diet until followup lipase and reevaluation tomorrow morning. We'll order an ultrasound of her abdomen to evaluate for gallstones or abnormalities in the biliary system. 2. Hyponatremia, likely secondary to mild hypovolemia. Will treat with normal saline infusion. 3. Hypothyroidism. She is treated chronically with Synthroid. Her TSH was stable and normal 6 months ago. 4. Hypertension. The patient is treated chronically with Cozaar, amlodipine, and metoprolol. These medications will be held overnight and restarted tomorrow morning. We'll add when necessary IV hydralazine. 5. Mild hyperglycemia. Her venous glucose was approximately 150. She has no prior history of diabetes. We'll order hemoglobin A1c. 6. Mild lactic acidosis. We'll hydrate  and provide supportive treatment. We'll check a followup lactic acid level tomorrow morning.    Code Status: Full code until clarified with the patient's POA/daughter. Family Communication: Discussed with grandson, Melissa Carroll. Disposition Plan: Anticipate discharge within the next 48 hours.  Time spent: One hour.  [facility] Pager [phone]

## 2013-08-14 NOTE — ED Notes (Signed)
Bed: WH67 Expected date: 08/14/13 Expected time: 2:42 PM Means of arrival: Ambulance Comments: Fall

## 2013-08-14 NOTE — ED Provider Notes (Signed)
CSN: [id number]     Arrival date & time 08/14/13  1459 History   First MD Initiated Contact with Patient 08/14/13 1459     Chief Complaint  Patient presents with  . Fall     (Consider location/radiation/quality/duration/timing/severity/associated sxs/prior Treatment) HPI Comments: 80 yo female who fell about a week ago. Was evaluated at that time, and workup revealed T spine compression fractures which were felt to be old.  She now complains of abdominal pain in epigastric region.  No nausea, no vomiting, no diarrhea.  No chest pain or SOB.  No fevers.  Pt thinks her pain has been going on for 1-2 days, but she is unsure.  Patient is a 80 y.o. female presenting with fall.  Fall This is a new problem. Episode onset: about a week ago. Episode frequency: once. The problem has been resolved. Associated symptoms include abdominal pain. Pertinent negatives include no chest pain, no headaches and no shortness of breath. Nothing aggravates the symptoms. Nothing relieves the symptoms.    Past Medical History  Diagnosis Date  . Arthritis   . Osteoarthritis   . Hypertension   . Cataracts, bilateral   . Closed fracture of unspecified part of lower end of humerus   . Retinal neovascularization NOS   . Macular degeneration (senile) of retina, unspecified   . Insomnia, unspecified   . Unspecified hypothyroidism   . Unspecified hearing loss   . Osteoarthrosis, unspecified whether generalized or localized, unspecified site   . Senile osteoporosis   . Urinary tract infection 08/10/2013  . Vertebral fracture 08/09/2013    T7 10%, T12 70%    Past Surgical History  Procedure Laterality Date  . Ankle fracture surgery    . Abdominal hysterectomy    . Closed reduction with humeral pin insertion  04/01/2012    Procedure: CLOSED REDUCTION WITH HUMERAL PIN INSERTION;  Surgeon: Schuyler Amor, MD;  Location: [facility];  Service: Orthopedics;  Laterality: Left;   History reviewed. No pertinent family  history. History  Substance Use Topics  . Smoking status: Never Smoker   . Smokeless tobacco: Not on file  . Alcohol Use: No   OB History   Grav Para Term Preterm Abortions TAB SAB Ect Mult Living                 Review of Systems  Respiratory: Negative for shortness of breath.   Cardiovascular: Negative for chest pain.  Gastrointestinal: Positive for abdominal pain.  Neurological: Negative for headaches.  All other systems reviewed and are negative.      Allergies  Review of patient's allergies indicates no known allergies.  Home Medications   Current Outpatient Rx  Name  Route  Sig  Dispense  Refill  . acetaminophen (TYLENOL) 500 MG tablet   Oral   Take 500 mg by mouth every 6 (six) hours as needed. Pain         . amLODipine (NORVASC) 10 MG tablet   Oral   Take 10 mg by mouth daily.         Marland Kitchen aspirin EC 81 MG tablet   Oral   Take 81 mg by mouth daily.         . beta carotene w/minerals (OCUVITE) tablet   Oral   Take 1 tablet by mouth 2 (two) times daily.          . cholecalciferol (VITAMIN D) 1000 UNITS tablet   Oral   Take 1,000 Units by mouth daily.         Marland Kitchen  levothyroxine (SYNTHROID, LEVOTHROID) 50 MCG tablet      Take by mouth daily   30 tablet   5   . losartan (COZAAR) 100 MG tablet   Oral   Take 100 mg by mouth daily.         . Multiple Vitamin (MULTIVITAMIN WITH MINERALS) TABS   Oral   Take 1 tablet by mouth daily.         Vladimir Faster Glycol-Propyl Glycol (SYSTANE) 0.4-0.3 % SOLN   Ophthalmic   Apply to eye. Two drops both eyes twice daily for dry eyes         . raloxifene (EVISTA) 60 MG tablet   Oral   Take 60 mg by mouth daily.         Marland Kitchen senna-docusate (SENOKOT-S) 8.6-50 MG per tablet   Oral   Take 2 tablets by mouth at bedtime.         . traMADol (ULTRAM) 50 MG tablet   Oral   Take 50 mg by mouth every 6 (six) hours as needed (pain).         Marland Kitchen zolpidem (AMBIEN) 5 MG tablet      Take one tablet by mouth  at bedtime as needed for insomnia. Do not give before 9pm   30 tablet   5   . metoprolol (LOPRESSOR) 50 MG tablet   Oral   Take 50 mg by mouth. Take 75mg  twice daily          BP 157/79  Pulse 66  Temp(Src) 98.1 F (36.7 C) (Oral)  Resp 16  SpO2 93% Physical Exam  Nursing note and vitals reviewed. Constitutional: She is oriented to person, place, and time. She appears well-developed and well-nourished. No distress.  HENT:  Head: Normocephalic and atraumatic.  Mouth/Throat: Oropharynx is clear and moist.  Eyes: Conjunctivae are normal. Pupils are equal, round, and reactive to light. No scleral icterus.  Neck: Neck supple.  Cardiovascular: Normal rate, regular rhythm, normal heart sounds and intact distal pulses.   No murmur heard. Pulmonary/Chest: Effort normal and breath sounds normal. No stridor. No respiratory distress. She has no rales.  Abdominal: Soft. Bowel sounds are normal. She exhibits no distension. There is tenderness in the epigastric area and left upper quadrant. There is no rigidity, no rebound and no guarding.  Musculoskeletal: Normal range of motion.  Neurological: She is alert and oriented to person, place, and time.  Skin: Skin is warm and dry. No rash noted.  Psychiatric: She has a normal mood and affect. Her behavior is normal.    ED Course  Procedures (including critical care time) Labs Review Labs Reviewed  CBC WITH DIFFERENTIAL - Abnormal; Notable for the following:    Hemoglobin 15.3 (*)    All other components within normal limits  COMPREHENSIVE METABOLIC PANEL - Abnormal; Notable for the following:    Sodium 133 (*)    Chloride 94 (*)    Glucose, Bld 149 (*)    GFR calc non Af Amer 76 (*)    GFR calc Af Amer 89 (*)    All other components within normal limits  LIPASE, BLOOD - Abnormal; Notable for the following:    Lipase 81 (*)    All other components within normal limits  LACTIC ACID, PLASMA - Abnormal; Notable for the following:     Lactic Acid, Venous 3.3 (*)    All other components within normal limits  URINALYSIS, ROUTINE W REFLEX MICROSCOPIC  HEMOGLOBIN A1C  COMPREHENSIVE METABOLIC  PANEL  CBC  LIPASE, BLOOD  LACTIC ACID, PLASMA   Imaging Review Dg Chest 2 View  08/14/2013   CLINICAL DATA:  Epigastric pain  EXAM: CHEST  2 VIEW  COMPARISON:  03/30/2012  FINDINGS: Cardiac shadow is within normal limits. The lungs are clear bilaterally. Mild interstitial changes are again seen. No acute bony abnormality is noted. Compression deformities are noted.  IMPRESSION: No active cardiopulmonary disease.   Electronically Signed   By: Inez Catalina M.D.   On: 08/14/2013 16:08   Ct Abdomen Pelvis W Contrast  08/14/2013   CLINICAL DATA:  Abdominal pain  EXAM: CT ABDOMEN AND PELVIS WITH CONTRAST  TECHNIQUE: Multidetector CT imaging of the abdomen and pelvis was performed using the standard protocol following bolus administration of intravenous contrast.  CONTRAST:  52mL OMNIPAQUE IOHEXOL 300 MG/ML SOLN, 151mL OMNIPAQUE IOHEXOL 300 MG/ML SOLN  COMPARISON:  CT ABD/PELVIS W CM dated 11/08/2009  FINDINGS: Areas of interstitial prominence and mild increased density within the lung bases. Stable bleb medial left lung base.  Atherosclerotic calcifications within the aorta, mesenteric, and iliac vessels no evidence of abdominal aortic aneurysm.  Diffuse low attenuation within the liver parenchyma otherwise negative. Gallbladder and gallbladder fossa unremarkable. The spleen, adrenals, pancreas are unremarkable. Small subcentimeter nonenhancing low attenuating foci within the right and left kidneys, stable, consistent with multiple cysts. The kidneys otherwise unremarkable.  There is diffuse diverticulosis within the sigmoid colon. The bowel is otherwise negative.  No abdominal or pelvic masses, free fluid, loculated fluid collections, no adenopathy appreciated. Stable small left ovarian cyst.  Multilevel spondylosis within the thoracolumbar spine. Chronic  compression deformity involving T12. No aggressive osseous lesions.  No abdominal wall or inguinal hernia appreciated.  IMPRESSION: 1. Atelectasis versus scarring within the lung bases. 2. Stable chronic findings within the abdomen without evidence of focal or acute abnormalities. No CT findings accounting for the patient's clinical presentation. 3. Mild hepatic steatosis   Electronically Signed   By: Margaree Mackintosh M.D.   On: 08/14/2013 19:02  All radiology studies independently viewed by me.      EKG Interpretation None      MDM   Final diagnoses:  Abdominal pain    80 yo female presenting from her living facility due to abdominal pain.  I spoke with her daughter, who states she has been complaining of some left lower chest wall pain for the past few days (possibly stemming from a fall 8 days ago).  However, today, she took a few bites of her food and had severe epigastric abdominal pain.  On exam, well appearing, but does have some epigastric tenderness.  Plan labs and CT.  Pain better controlled after receiving tramadol PTA.    CT unremarkable.  Labs show mild elevations in lipase and lactic acid.  [facility] for admission   Houston Siren III, MD 08/14/13 [id number]

## 2013-08-14 NOTE — ED Notes (Signed)
PER EMS- pt picked up from [facility] c/o fall x1 week.  Denies loc or deformities.  Since fall pt reports increased pain in upper abd, rib area.  Reports pain 6/10.  PTA given tramadol at facility x1 hours.  Alert and oriented per baseline.

## 2013-08-15 ENCOUNTER — Encounter (HOSPITAL_COMMUNITY): Payer: Self-pay

## 2013-08-15 ENCOUNTER — Other Ambulatory Visit: Payer: Self-pay

## 2013-08-15 ENCOUNTER — Inpatient Hospital Stay (HOSPITAL_COMMUNITY): Payer: Medicare Other

## 2013-08-15 DIAGNOSIS — E871 Hypo-osmolality and hyponatremia: Secondary | ICD-10-CM | POA: Diagnosis not present

## 2013-08-15 DIAGNOSIS — I10 Essential (primary) hypertension: Secondary | ICD-10-CM

## 2013-08-15 DIAGNOSIS — E039 Hypothyroidism, unspecified: Secondary | ICD-10-CM | POA: Diagnosis not present

## 2013-08-15 DIAGNOSIS — W19XXXA Unspecified fall, initial encounter: Secondary | ICD-10-CM | POA: Diagnosis not present

## 2013-08-15 DIAGNOSIS — K859 Acute pancreatitis without necrosis or infection, unspecified: Secondary | ICD-10-CM | POA: Diagnosis not present

## 2013-08-15 DIAGNOSIS — E872 Acidosis: Secondary | ICD-10-CM | POA: Diagnosis not present

## 2013-08-15 DIAGNOSIS — R109 Unspecified abdominal pain: Secondary | ICD-10-CM | POA: Diagnosis not present

## 2013-08-15 DIAGNOSIS — K7689 Other specified diseases of liver: Secondary | ICD-10-CM | POA: Diagnosis not present

## 2013-08-15 DIAGNOSIS — R7309 Other abnormal glucose: Secondary | ICD-10-CM | POA: Diagnosis not present

## 2013-08-15 LAB — CBC
HCT: 38.1 % (ref 36.0–46.0)
HEMOGLOBIN: 13.3 g/dL (ref 12.0–15.0)
MCH: 33 pg (ref 26.0–34.0)
MCHC: 34.9 g/dL (ref 30.0–36.0)
MCV: 94.5 fL (ref 78.0–100.0)
Platelets: 192 10*3/uL (ref 150–400)
RBC: 4.03 MIL/uL (ref 3.87–5.11)
RDW: 13.7 % (ref 11.5–15.5)
WBC: 6.6 10*3/uL (ref 4.0–10.5)

## 2013-08-15 LAB — COMPREHENSIVE METABOLIC PANEL
ALBUMIN: 3.2 g/dL — AB (ref 3.5–5.2)
ALT: 19 U/L (ref 0–35)
AST: 19 U/L (ref 0–37)
Alkaline Phosphatase: 52 U/L (ref 39–117)
BUN: 12 mg/dL (ref 6–23)
CALCIUM: 8.7 mg/dL (ref 8.4–10.5)
CO2: 23 meq/L (ref 19–32)
Chloride: 97 mEq/L (ref 96–112)
Creatinine, Ser: 0.52 mg/dL (ref 0.50–1.10)
GFR calc Af Amer: >90 mL/min (ref >90)
GFR, EST NON AFRICAN AMERICAN: 79 mL/min — AB (ref >90)
Glucose, Bld: 136 mg/dL — ABNORMAL HIGH (ref 70–99)
Potassium: 3.6 mEq/L — ABNORMAL LOW (ref 3.7–5.3)
SODIUM: 133 meq/L — AB (ref 137–147)
Total Bilirubin: 1 mg/dL (ref 0.3–1.2)
Total Protein: 6.2 g/dL (ref 6.0–8.3)

## 2013-08-15 LAB — HEMOGLOBIN A1C
Hgb A1c MFr Bld: 6 % — ABNORMAL HIGH (ref <5.7)
MEAN PLASMA GLUCOSE: 126 mg/dL — AB (ref <117)

## 2013-08-15 LAB — LIPASE, BLOOD: Lipase: 30 U/L (ref 11–59)

## 2013-08-15 LAB — LACTIC ACID, PLASMA: Lactic Acid, Venous: 1.9 mmol/L (ref 0.5–2.2)

## 2013-08-15 LAB — TROPONIN I: Troponin I: <0.3 ng/mL (ref <0.30)

## 2013-08-15 MED ORDER — TRAMADOL HCL 50 MG PO TABS: 50.0000 mg | ORAL_TABLET | Freq: Four times a day (QID) | ORAL | Status: DC | PRN

## 2013-08-15 MED ORDER — OMEPRAZOLE 40 MG PO CPDR: 40.0000 mg | DELAYED_RELEASE_CAPSULE | Freq: Every day | ORAL | Status: DC

## 2013-08-15 NOTE — Progress Notes (Signed)
[facility] PROGRESS NOTE  JAZLEEN ROBECK HKV:[id number] DOB: 30-Jul-1916 DOA: 08/14/2013 PCP: Estill Dooms, MD  Assessment/Plan: Principal Problem:  Abdominal pain  Active Problems:  Pancreatitis, acute  Hypertension  Unspecified hypothyroidism  Musculoskeletal pain  Hyponatremia  Hyperglycemia  80 y.o. female with a history of osteoarthritis, hypertension, recent vertebral fracture March 2015, and hypothyroidism, who presents from [facility] with a chief complaint of epigastric abdominal pain   1. Epigastric pain, now patient reports more L sided chest , back pain;  -likely musculoskeletal in origin, with recent fall; h/o vertebral fracture; DJD C spine; CT abd: unremarkable;  -check ECG, trop; pend abd [place]; cont pain control   2. Hypothyroidism. Cont Synthroid. Her TSH was stable and normal 6 months ago  3. Hypertension. The patient is treated chronically with Cozaar, amlodipine, and metoprolol.   4. Mild hyperglycemia. no prior history of diabetes. Pend hemoglobin A1c  Code Status: DNR Family Communication: d/w patient, her daughter (indicate person spoken with, relationship, and if by phone, the number) Disposition Plan: pend clincia improvement, 24-48 hours    Consultants:  None   Procedures:  None   Antibiotics:  None  (indicate start date, and stop date if known)  HPI/Subjective: alert  Objective: Filed Vitals:   08/15/13 0510  BP: 156/79  Pulse: 76  Temp: 97.8 F (36.6 C)  Resp: 18    Intake/Output Summary (Last 24 hours) at 08/15/13 0913 Last data filed at 08/15/13 0557  Gross per 24 hour  Intake 616.67 ml  Output      0 ml  Net 616.67 ml   There were no vitals filed for this visit.  Exam:   General:  alert  Cardiovascular: s1,s2 rrr  Respiratory: CTA BL  Abdomen: soft, nt,nd   Musculoskeletal: NO LE edema   Data Reviewed: Basic Metabolic Panel:  Recent Labs Lab 08/14/13 1605 08/15/13 0509  NA  133* 133*  K 3.9 3.6*  CL 94* 97  CO2 24 23  GLUCOSE 149* 136*  BUN 15 12  CREATININE 0.58 0.52  CALCIUM 9.8 8.7   Liver Function Tests:  Recent Labs Lab 08/14/13 1605 08/15/13 0509  AST 22 19  ALT 22 19  ALKPHOS 61 52  BILITOT 0.9 1.0  PROT 7.6 6.2  ALBUMIN 3.7 3.2*    Recent Labs Lab 08/14/13 1605 08/15/13 0509  LIPASE 81* 30   No results found for this basename: AMMONIA,  in the last 168 hours CBC:  Recent Labs Lab 08/14/13 1605 08/15/13 0509  WBC 7.7 6.6  NEUTROABS 3.6  --   HGB 15.3* 13.3  HCT 43.9 38.1  MCV 95.6 94.5  PLT 201 192   Cardiac Enzymes: No results found for this basename: CKTOTAL, CKMB, CKMBINDEX, TROPONINI,  in the last 168 hours BNP (last 3 results) No results found for this basename: PROBNP,  in the last 8760 hours CBG: No results found for this basename: GLUCAP,  in the last 168 hours  No results found for this or any previous visit (from the past 240 hour(s)).   Studies: Dg Chest 2 View  08/14/2013   CLINICAL DATA:  Epigastric pain  EXAM: CHEST  2 VIEW  COMPARISON:  03/30/2012  FINDINGS: Cardiac shadow is within normal limits. The lungs are clear bilaterally. Mild interstitial changes are again seen. No acute bony abnormality is noted. Compression deformities are noted.  IMPRESSION: No active cardiopulmonary disease.   Electronically Signed   By: Linus Mako.D.  On: 08/14/2013 16:08   Ct Abdomen Pelvis W Contrast  08/14/2013   CLINICAL DATA:  Abdominal pain  EXAM: CT ABDOMEN AND PELVIS WITH CONTRAST  TECHNIQUE: Multidetector CT imaging of the abdomen and pelvis was performed using the standard protocol following bolus administration of intravenous contrast.  CONTRAST:  59mL OMNIPAQUE IOHEXOL 300 MG/ML SOLN, 118mL OMNIPAQUE IOHEXOL 300 MG/ML SOLN  COMPARISON:  CT ABD/PELVIS W CM dated 11/08/2009  FINDINGS: Areas of interstitial prominence and mild increased density within the lung bases. Stable bleb medial left lung base.   Atherosclerotic calcifications within the aorta, mesenteric, and iliac vessels no evidence of abdominal aortic aneurysm.  Diffuse low attenuation within the liver parenchyma otherwise negative. Gallbladder and gallbladder fossa unremarkable. The spleen, adrenals, pancreas are unremarkable. Small subcentimeter nonenhancing low attenuating foci within the right and left kidneys, stable, consistent with multiple cysts. The kidneys otherwise unremarkable.  There is diffuse diverticulosis within the sigmoid colon. The bowel is otherwise negative.  No abdominal or pelvic masses, free fluid, loculated fluid collections, no adenopathy appreciated. Stable small left ovarian cyst.  Multilevel spondylosis within the thoracolumbar spine. Chronic compression deformity involving T12. No aggressive osseous lesions.  No abdominal wall or inguinal hernia appreciated.  IMPRESSION: 1. Atelectasis versus scarring within the lung bases. 2. Stable chronic findings within the abdomen without evidence of focal or acute abnormalities. No CT findings accounting for the patient's clinical presentation. 3. Mild hepatic steatosis   Electronically Signed   By: Margaree Mackintosh M.D.   On: 08/14/2013 19:02    Scheduled Meds: . amLODipine  10 mg Oral Daily  . aspirin EC  81 mg Oral Daily  . heparin  5,000 Units Subcutaneous 3 times per day  . levothyroxine  50 mcg Oral QAC breakfast  . losartan  100 mg Oral Daily  . metoprolol  75 mg Oral BID  . pantoprazole (PROTONIX) IV  40 mg Intravenous QHS  . polyvinyl alcohol  2 drop Both Eyes BID  . senna-docusate  2 tablet Oral QHS   Continuous Infusions: . 0.9 % NaCl with KCl 20 mEq / L 100 mL/hr at 08/14/13 2347    Principal Problem:   Abdominal pain Active Problems:   Hypertension   Unspecified hypothyroidism   Pancreatitis, acute   Musculoskeletal pain   Hyponatremia   Hyperglycemia    Time spent: >35 minutes     Kinnie Feil  [facility] Pager [phone]. If  7PM-7AM, please contact night-coverage at [email or web address], password [facility] 08/15/2013, 9:13 AM  LOS: 1 day

## 2013-08-15 NOTE — Discharge Summary (Signed)
Physician Discharge Summary  Melissa Carroll [id number]:[id number] DOB: Sep 18, 1916 DOA: 08/14/2013  PCP: Estill Dooms, MD  Admit date: 08/14/2013 Discharge date: 08/15/2013  Time spent: >35 minutes  Recommendations for Outpatient Follow-up:  F/u with PCP in 1-2 weeks  Discharge Diagnoses:  Principal Problem:   Abdominal pain Active Problems:   Hypertension   Unspecified hypothyroidism   Pancreatitis, acute   Musculoskeletal pain   Hyponatremia   Hyperglycemia   Discharge Condition: stable   Diet recommendation: heart healthy   There were no vitals filed for this visit.  History of present illness:  Principal Problem:  Abdominal pain  Active Problems:  Pancreatitis, acute  Hypertension  Unspecified hypothyroidism  Musculoskeletal pain  Hyponatremia  Hyperglycemia   80 y.o. female with a history of osteoarthritis, hypertension, recent vertebral fracture March 2015, and hypothyroidism, who presents from [facility] assisted [facility] with a chief complaint of epigastric abdominal pain   Hospital Course:  1. Epigastric pain, patient reports L sided chest , back pains likely musculoskeletal in origin, with recent fall; h/o vertebral fracture; DJD C spine;  -CT/US abd: unremarkable; abd exam unremarkable; ECG, trop negative; cont pain control; started PPI empiric for 2 weeks 2. Hypothyroidism. Cont Synthroid. Her TSH was stable and normal 6 months ago  3. Hypertension. The patient is treated chronically with Cozaar, amlodipine, and metoprolol.  4. Mild hyperglycemia. no prior history of diabetes. Pend hemoglobin A1c  D/w patient, her daughter;  -patient is DNR  Procedures:  None  (i.e. Studies not automatically included, echos, thoracentesis, etc; not x-rays)  Consultations:  None   Discharge Exam: Filed Vitals:   08/15/13 0510  BP: 156/79  Pulse: 76  Temp: 97.8 F (36.6 C)  Resp: 18    General: alert Cardiovascular: s1,s2 rrr Respiratory: CTA BL  Discharge  Instructions  Discharge Orders   Future Appointments Provider Department Dept Phone   02/22/2014 10:00 AM Estill Dooms, MD [facility] [phone]   Future Orders Complete By Expires   Diet - low sodium heart healthy  As directed    Discharge instructions  As directed    Comments:     Please follow up with primary care doctor in 1-2 weeks as needed   Increase activity slowly  As directed        Medication List         acetaminophen 500 MG tablet  Commonly known as:  TYLENOL  Take 500 mg by mouth every 6 (six) hours as needed. Pain     amLODipine 10 MG tablet  Commonly known as:  NORVASC  Take 10 mg by mouth daily.     aspirin EC 81 MG tablet  Take 81 mg by mouth daily.     beta carotene w/minerals tablet  Take 1 tablet by mouth 2 (two) times daily.     cholecalciferol 1000 UNITS tablet  Commonly known as:  VITAMIN D  Take 1,000 Units by mouth daily.     levothyroxine 50 MCG tablet  Commonly known as:  SYNTHROID, LEVOTHROID  Take by mouth daily     losartan 100 MG tablet  Commonly known as:  COZAAR  Take 100 mg by mouth daily.     metoprolol 50 MG tablet  Commonly known as:  LOPRESSOR  Take 50 mg by mouth. Take 75mg  twice daily     multivitamin with minerals Tabs tablet  Take 1 tablet by mouth daily.     omeprazole 40 MG capsule  Commonly known as:  PRILOSEC  Take 1 capsule (40 mg total) by mouth daily.     raloxifene 60 MG tablet  Commonly known as:  EVISTA  Take 60 mg by mouth daily.     senna-docusate 8.6-50 MG per tablet  Commonly known as:  Senokot-S  Take 2 tablets by mouth at bedtime.     SYSTANE 0.4-0.3 % Soln  Generic drug:  Polyethyl Glycol-Propyl Glycol  Apply to eye. Two drops both eyes twice daily for dry eyes     traMADol 50 MG tablet  Commonly known as:  ULTRAM  Take 1 tablet (50 mg total) by mouth every 6 (six) hours as needed (pain).     zolpidem 5 MG tablet  Commonly known as:  AMBIEN  Take one tablet by mouth at  bedtime as needed for insomnia. Do not give before 9pm       No Known Allergies     Follow-up Information   Follow up with GREEN, Viviann Spare, MD In 1 week.   Specialty:  Internal Medicine   Contact information:   [place] [id number] [phone]        The results of significant diagnostics from this hospitalization (including imaging, microbiology, ancillary and laboratory) are listed below for reference.    Significant Diagnostic Studies: Dg Chest 2 View  08/14/2013   CLINICAL DATA:  Epigastric pain  EXAM: CHEST  2 VIEW  COMPARISON:  03/30/2012  FINDINGS: Cardiac shadow is within normal limits. The lungs are clear bilaterally. Mild interstitial changes are again seen. No acute bony abnormality is noted. Compression deformities are noted.  IMPRESSION: No active cardiopulmonary disease.   Electronically Signed   By: Inez Catalina M.D.   On: 08/14/2013 16:08   US Abdomen Complete  08/15/2013   CLINICAL DATA:  Abdominal pain.  Elevated lipase.  EXAM: ULTRASOUND ABDOMEN COMPLETE  COMPARISON:  CT scan 08/14/2013  FINDINGS: Gallbladder:  No gallstones or wall thickening visualized. No sonographic Murphy sign noted.  Common bile duct:  Diameter: 3.9 mm, normal.  Liver:  No focal lesions. Diffuse increased echogenicity consistent with hepatic steatosis.  IVC:  Normal.  Pancreas:  Normal.  Spleen:  Normal.  8.1 cm in length.  Right Kidney:  Length: 9.2 cm.  1.1 cm cyst in the mid right kidney.  Left Kidney:  Length: 9.9 cm. Echogenicity within normal limits. No mass or hydronephrosis visualized.  Abdominal aorta:  1.8 cm.  Calcifications in the wall of the aorta.  Other findings:  None  IMPRESSION: Hepatic steatosis. No acute abnormalities. Specifically, no gallstones or biliary ductal dilatation.   Electronically Signed   By: Rozetta Nunnery M.D.   On: 08/15/2013 11:23   Ct Abdomen Pelvis W Contrast  08/14/2013   CLINICAL DATA:  Abdominal pain  EXAM: CT ABDOMEN AND PELVIS WITH  CONTRAST  TECHNIQUE: Multidetector CT imaging of the abdomen and pelvis was performed using the standard protocol following bolus administration of intravenous contrast.  CONTRAST:  93mL OMNIPAQUE IOHEXOL 300 MG/ML SOLN, 167mL OMNIPAQUE IOHEXOL 300 MG/ML SOLN  COMPARISON:  CT ABD/PELVIS W CM dated 11/08/2009  FINDINGS: Areas of interstitial prominence and mild increased density within the lung bases. Stable bleb medial left lung base.  Atherosclerotic calcifications within the aorta, mesenteric, and iliac vessels no evidence of abdominal aortic aneurysm.  Diffuse low attenuation within the liver parenchyma otherwise negative. Gallbladder and gallbladder fossa unremarkable. The spleen, adrenals, pancreas are unremarkable. Small subcentimeter nonenhancing low attenuating foci within the right and left  kidneys, stable, consistent with multiple cysts. The kidneys otherwise unremarkable.  There is diffuse diverticulosis within the sigmoid colon. The bowel is otherwise negative.  No abdominal or pelvic masses, free fluid, loculated fluid collections, no adenopathy appreciated. Stable small left ovarian cyst.  Multilevel spondylosis within the thoracolumbar spine. Chronic compression deformity involving T12. No aggressive osseous lesions.  No abdominal wall or inguinal hernia appreciated.  IMPRESSION: 1. Atelectasis versus scarring within the lung bases. 2. Stable chronic findings within the abdomen without evidence of focal or acute abnormalities. No CT findings accounting for the patient's clinical presentation. 3. Mild hepatic steatosis   Electronically Signed   By: Margaree Mackintosh M.D.   On: 08/14/2013 19:02    Microbiology: No results found for this or any previous visit (from the past 240 hour(s)).   Labs: Basic Metabolic Panel:  Recent Labs Lab 08/14/13 1605 08/15/13 0509  NA 133* 133*  K 3.9 3.6*  CL 94* 97  CO2 24 23  GLUCOSE 149* 136*  BUN 15 12  CREATININE 0.58 0.52  CALCIUM 9.8 8.7   Liver  Function Tests:  Recent Labs Lab 08/14/13 1605 08/15/13 0509  AST 22 19  ALT 22 19  ALKPHOS 61 52  BILITOT 0.9 1.0  PROT 7.6 6.2  ALBUMIN 3.7 3.2*    Recent Labs Lab 08/14/13 1605 08/15/13 0509  LIPASE 81* 30   No results found for this basename: AMMONIA,  in the last 168 hours CBC:  Recent Labs Lab 08/14/13 1605 08/15/13 0509  WBC 7.7 6.6  NEUTROABS 3.6  --   HGB 15.3* 13.3  HCT 43.9 38.1  MCV 95.6 94.5  PLT 201 192   Cardiac Enzymes:  Recent Labs Lab 08/15/13 0845  TROPONINI <0.30   BNP: BNP (last 3 results) No results found for this basename: PROBNP,  in the last 8760 hours CBG: No results found for this basename: GLUCAP,  in the last 168 hours     Signed:  Kinnie Feil  [facility] 08/15/2013, 11:43 AM

## 2013-08-17 ENCOUNTER — Non-Acute Institutional Stay: Payer: Medicare Other | Admitting: Nurse Practitioner

## 2013-08-17 ENCOUNTER — Encounter: Payer: Self-pay | Admitting: Nurse Practitioner

## 2013-08-17 DIAGNOSIS — R071 Chest pain on breathing: Secondary | ICD-10-CM | POA: Diagnosis not present

## 2013-08-17 DIAGNOSIS — N390 Urinary tract infection, site not specified: Secondary | ICD-10-CM | POA: Diagnosis not present

## 2013-08-17 DIAGNOSIS — K5900 Constipation, unspecified: Secondary | ICD-10-CM

## 2013-08-17 DIAGNOSIS — G4700 Insomnia, unspecified: Secondary | ICD-10-CM

## 2013-08-17 DIAGNOSIS — R109 Unspecified abdominal pain: Secondary | ICD-10-CM

## 2013-08-17 DIAGNOSIS — E871 Hypo-osmolality and hyponatremia: Secondary | ICD-10-CM | POA: Diagnosis not present

## 2013-08-17 DIAGNOSIS — IMO0002 Reserved for concepts with insufficient information to code with codable children: Secondary | ICD-10-CM

## 2013-08-17 DIAGNOSIS — E040 Nontoxic diffuse goiter: Secondary | ICD-10-CM | POA: Diagnosis not present

## 2013-08-17 DIAGNOSIS — K859 Acute pancreatitis without necrosis or infection, unspecified: Secondary | ICD-10-CM

## 2013-08-17 DIAGNOSIS — E039 Hypothyroidism, unspecified: Secondary | ICD-10-CM | POA: Diagnosis not present

## 2013-08-17 DIAGNOSIS — R0789 Other chest pain: Secondary | ICD-10-CM | POA: Insufficient documentation

## 2013-08-17 DIAGNOSIS — R079 Chest pain, unspecified: Secondary | ICD-10-CM | POA: Diagnosis not present

## 2013-08-17 DIAGNOSIS — I10 Essential (primary) hypertension: Secondary | ICD-10-CM

## 2013-08-17 DIAGNOSIS — E441 Mild protein-calorie malnutrition: Secondary | ICD-10-CM | POA: Diagnosis not present

## 2013-08-17 DIAGNOSIS — I2510 Atherosclerotic heart disease of native coronary artery without angina pectoris: Secondary | ICD-10-CM | POA: Diagnosis not present

## 2013-08-17 LAB — TSH: TSH: 1.75 u[IU]/mL (ref 0.41–5.90)

## 2013-08-17 NOTE — Assessment & Plan Note (Signed)
Fell 2 2weeks ago, c/o abd pain initially, now pain is localized in the left posterior lateral lower rib cage area, positional. Will X-ray left ribs. Start with Norco 5/325 bid-titrate up for better pain control.

## 2013-08-17 NOTE — Assessment & Plan Note (Signed)
Na 133 08/14/13. Repeat BMP

## 2013-08-17 NOTE — Assessment & Plan Note (Signed)
Pain: will manage pain with Norco 5/325 bid.

## 2013-08-17 NOTE — Assessment & Plan Note (Signed)
Controlled on Amlodipine 10mg and Losartan 100mg and Metoprolol 75mg bid.    

## 2013-08-17 NOTE — Progress Notes (Signed)
Patient ID: Melissa Carroll, female   DOB: 30-Mar-1917, 80 y.o.   MRN: [id number]     Code Status: Full Code  No Known Allergies  Chief Complaint  Patient presents with  . Medical Managment of Chronic Issues  . Acute Visit    s/p fall, ED evaluation 08/14/13, persisted left sided lower rib cage pain    HPI: Patient is a 80 y.o. female seen in the AL at [facility]  today for evaluation of mid back/abd pain and other chronic medical conditions.    08/14/13 ED: Abdominal pain Pancreatitis, acute  presented ED with a chief complaint of epigastric abdominal pain-Epigastric pain, patient reports L sided chest , back pains likely musculoskeletal in origin, with recent fall; h/o vertebral fracture; DJD C spine-CT/US abd: unremarkable; abd exam unremarkable; ECG, trop negative; cont pain control; started PPI empiric for 2 weeks    Saw Dr. Nyoka Cowden 08/10/13 for [place] 08/06/13. Sustained large ecchymosis on the medial right arm. Xrays of the spine showed compression fractures of T7 and T12. She is not very tender there, and this is likely to be old fractures not related to this fall.        Problem List Items Addressed This Visit   Abdominal pain - Primary     [place] abd 08/15/13, CT abd/pelvis 08/14/13 unremarkable. She was diagnosed with acute pancreatitis-Lipase 81 08/14/13 and down to 30 08/15/13. Will repeat Lipase. Also obtain UA C/S and Amylase. Norco bid for pain for now. Continue PPI. Pain is localized in the posterior lateral lower left rib cage. X-ray left ribs.     Chest wall pain     Fell 2 2weeks ago, c/o abd pain initially, now pain is localized in the left posterior lateral lower rib cage area, positional. Will X-ray left ribs. Start with Norco 5/325 bid-titrate up for better pain control.     Hypertension     Controlled on Amlodipine 10mg  and Losartan 100mg  and Metoprolol 75mg  bid.     Hyponatremia     Na 133 08/14/13. Repeat BMP    Insomnia, unspecified     he stated she has been taking  Ambien 5mg  for years. She sleeps well except occasional sleeplessness that she takes Tylenol to help with pain. She declined discontinuation of Ambien and desires Tylenol as needed. Will continue to monitor for AR of Ambien.      Pancreatitis, acute     Repeat Lipase and Amylase.     Relevant Medications      HYDROcodone-acetaminophen (NORCO/VICODIN) 5-325 MG per tablet   Unspecified constipation     Stable on Senokot S I nightly.       Unspecified hypothyroidism     akesLevothyroxine 26mcg 08/18/12, f/u TSH 3.86 11/09/12. Update TSH     Vertebral fracture     Pain: will manage pain with Norco 5/325 bid.        Review of Systems: Review of Systems  Constitutional: Negative for fever, chills, weight loss, malaise/fatigue and diaphoresis.  HENT: Positive for hearing loss. Negative for congestion, ear pain and sore throat.   Eyes: Negative for blurred vision, double vision, photophobia, pain, discharge and redness.  Respiratory: Negative for cough, sputum production and wheezing.   Cardiovascular: Negative for chest pain, orthopnea, claudication, leg swelling and PND.  Gastrointestinal: Positive for abdominal pain. Negative for heartburn, nausea, vomiting, diarrhea, constipation and blood in stool.       Was in upper epigastric area.   Genitourinary: Negative for dysuria, urgency,  frequency, hematuria and flank pain.  Musculoskeletal: Positive for back pain. Negative for falls, joint pain, myalgias and neck pain.       Posterior lateral lower left rib cage pain.   Skin: Negative for itching and rash.       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  Endo/Heme/Allergies: Negative for environmental allergies and polydipsia. Does not bruise/bleed easily.  Psychiatric/Behavioral: Positive for memory loss. Negative for depression and hallucinations.  The patient has insomnia. The patient is not nervous/anxious.   \  Past Medical History  Diagnosis Date  . Arthritis   . Osteoarthritis   . Hypertension   . Cataracts, bilateral   . Closed fracture of unspecified part of lower end of humerus   . Retinal neovascularization NOS   . Macular degeneration (senile) of retina, unspecified   . Insomnia, unspecified   . Unspecified hypothyroidism   . Unspecified hearing loss   . Osteoarthrosis, unspecified whether generalized or localized, unspecified site   . Senile osteoporosis   . Urinary tract infection 08/10/2013  . Vertebral fracture 08/09/2013    T7 10%, T12 70%   . Subarachnoid hemorrhage following injury 03/31/2012   Past Surgical History  Procedure Laterality Date  . Ankle fracture surgery    . Abdominal hysterectomy    . Closed reduction with humeral pin insertion  04/01/2012    Procedure: CLOSED REDUCTION WITH HUMERAL PIN INSERTION;  Surgeon: Schuyler Amor, MD;  Location: [facility];  Service: Orthopedics;  Laterality: Left;   Social History:   reports that she has never smoked. She does not have any smokeless tobacco history on file. She reports that she does not drink alcohol or use illicit drugs.  No family history on file.  Medications:   Medication List       This list is accurate as of: 08/17/13  2:04 PM.  Always use your most recent med list.               acetaminophen 500 MG tablet  Commonly known as:  TYLENOL  Take 500 mg by mouth every 6 (six) hours as needed. Pain     amLODipine 10 MG tablet  Commonly known as:  NORVASC  Take 10 mg by mouth daily.     aspirin EC 81 MG tablet  Take 81 mg by mouth daily.     beta carotene w/minerals tablet  Take 1 tablet by mouth 2 (two) times daily.     cholecalciferol 1000 UNITS tablet  Commonly known as:  VITAMIN D  Take 1,000 Units by mouth daily.     HYDROcodone-acetaminophen 5-325 MG per tablet  Commonly known as:  NORCO/VICODIN  Take 1 tablet by mouth  2 (two) times daily.     levothyroxine 50 MCG tablet  Commonly known as:  SYNTHROID, LEVOTHROID  Take by mouth daily     losartan 100 MG tablet  Commonly known as:  COZAAR  Take 100 mg by mouth daily.     metoprolol 50 MG tablet  Commonly known as:  LOPRESSOR  Take 50 mg by mouth. Take 75mg  twice daily     multivitamin with minerals Tabs tablet  Take 1 tablet by mouth daily.     omeprazole 40 MG capsule  Commonly known as:  PRILOSEC  Take 1 capsule (40 mg total) by mouth daily.     raloxifene 60 MG tablet  Commonly known as:  EVISTA  Take 60 mg by mouth daily.     senna-docusate 8.6-50 MG per tablet  Commonly known as:  Senokot-S  Take 2 tablets by mouth at bedtime.     SYSTANE 0.4-0.3 % Soln  Generic drug:  Polyethyl Glycol-Propyl Glycol  Apply to eye. Two drops both eyes twice daily for dry eyes     traMADol 50 MG tablet  Commonly known as:  ULTRAM  Take 1 tablet (50 mg total) by mouth every 6 (six) hours as needed (pain).     zolpidem 5 MG tablet  Commonly known as:  AMBIEN  Take one tablet by mouth at bedtime as needed for insomnia. Do not give before 9pm          Physical Exam: Physical Exam  Constitutional: She is oriented to person, place, and time. She appears well-developed and well-nourished.  HENT:  Head: Normocephalic and atraumatic.  Eyes: Conjunctivae and EOM are normal. Pupils are equal, round, and reactive to light.  Neck: Normal range of motion. Neck supple.  Cardiovascular:  No murmur heard. Pulmonary/Chest: Effort normal and breath sounds normal.  Abdominal: Soft. Bowel sounds are normal.  Musculoskeletal: Normal range of motion. She exhibits tenderness. She exhibits no edema.  Posterior lateral left lower rib cage pain.   Neurological: She is alert and oriented to person, place, and time. She displays normal reflexes. No cranial nerve deficit. She exhibits normal muscle tone. Coordination normal.  Skin: Skin is warm and dry. No rash  noted. No erythema.  Psychiatric: Her mood appears not anxious. Her affect is not angry, not labile and not inappropriate. Her speech is not rapid and/or pressured, not delayed and not slurred. She is not agitated, not aggressive, not slowed and not withdrawn. Thought content is not paranoid and not delusional. Cognition and memory are impaired. She does not express impulsivity or inappropriate judgment. Depressed: flat affect. She exhibits abnormal recent memory.    Filed Vitals:   08/17/13 1357  BP: 161/73  Pulse: 94  Temp: 97.8 F (36.6 C)  TempSrc: Tympanic  Resp: 18  SpO2: 92%      Labs reviewed: Basic Metabolic Panel:  Recent Labs  11/09/12 03/29/13 08/14/13 1605 08/15/13 0509  NA 138 138 133* 133*  K 4.0 3.9 3.9 3.6*  CL  --   --  94* 97  CO2  --   --  24 23  GLUCOSE  --   --  149* 136*  BUN 18 21 15 12   CREATININE 0.6 0.6 0.58 0.52  CALCIUM  --   --  9.8 8.7  TSH 3.86 4.73  --   --    Liver Function Tests:  Recent Labs  03/29/13 08/14/13 1605 08/15/13 0509  AST 13 22 19   ALT 14 22 19   ALKPHOS 39 61 52  BILITOT  --  0.9 1.0  PROT  --  7.6 6.2  ALBUMIN  --  3.7 3.2*    CBC:  Recent Labs  03/29/13 08/14/13 1605 08/15/13 0509  WBC 5.9 7.7 6.6  NEUTROABS  --  3.6  --   HGB 12.6 15.3* 13.3  HCT 36 43.9 38.1  MCV  --  95.6 94.5  PLT 153 201 192    Past Procedures:  08/14/2013 CLINICAL DATA: Epigastric pain EXAM: CHEST 2 VIEW . IMPRESSION: No active cardiopulmonary disease.   US Abdomen Complete  08/15/2013 CLINICAL DATA: Abdominal pain. Elevated lipase. EXAM: ULTRASOUND ABDOMEN  IMPRESSION: Hepatic steatosis. No acute abnormalities.  Specifically, no gallstones or biliary ductal dilatation.  Ct Abdomen Pelvis W Contrast  08/14/2013 CLINICAL DATA: Abdominal pain EXAM: CT ABDOMEN AND PELVIS WITH CONTRAST IMPRESSION: 1. Atelectasis versus scarring within the lung bases. 2. Stable chronic findings within the abdomen without evidence of focal or acute  abnormalities. No CT findings accounting for the patient's clinical presentation. 3. Mild hepatic steatosis  Assessment/Plan Abdominal pain [place] abd 08/15/13, CT abd/pelvis 08/14/13 unremarkable. She was diagnosed with acute pancreatitis-Lipase 81 08/14/13 and down to 30 08/15/13. Will repeat Lipase. Also obtain UA C/S and Amylase. Norco bid for pain for now. Continue PPI. Pain is localized in the posterior lateral lower left rib cage. X-ray left ribs.   Hyponatremia Na 133 08/14/13. Repeat BMP  Vertebral fracture Pain: will manage pain with Norco 5/325 bid.   Pancreatitis, acute Repeat Lipase and Amylase.   Hypertension Controlled on Amlodipine 10mg  and Losartan 100mg  and Metoprolol 75mg  bid.   Unspecified hypothyroidism akesLevothyroxine 39mcg 08/18/12, f/u TSH 3.86 11/09/12. Update TSH   Unspecified constipation Stable on Senokot S I nightly.     Insomnia, unspecified he stated she has been taking Ambien 5mg  for years. She sleeps well except occasional sleeplessness that she takes Tylenol to help with pain. She declined discontinuation of Ambien and desires Tylenol as needed. Will continue to monitor for AR of Ambien.    Chest wall pain Fell 2 2weeks ago, c/o abd pain initially, now pain is localized in the left posterior lateral lower rib cage area, positional. Will X-ray left ribs. Start with Norco 5/325 bid-titrate up for better pain control.     Family/ Staff Communication: observe the patient.   Goals of Care: AL  Labs/tests ordered: Cath UA C/S, Lipase, Amylase, TSH, X-ray L ribs.

## 2013-08-17 NOTE — Assessment & Plan Note (Signed)
Repeat Lipase and Amylase.

## 2013-08-17 NOTE — Assessment & Plan Note (Signed)
he stated she has been taking Ambien 5mg for years. She sleeps well except occasional sleeplessness that she takes Tylenol to help with pain. She declined discontinuation of Ambien and desires Tylenol as needed. Will continue to monitor for AR of Ambien.   

## 2013-08-17 NOTE — Assessment & Plan Note (Signed)
Stable on Senokot S I nightly  

## 2013-08-17 NOTE — Assessment & Plan Note (Addendum)
[place] abd 08/15/13, CT abd/pelvis 08/14/13 unremarkable. She was diagnosed with acute pancreatitis-Lipase 81 08/14/13 and down to 30 08/15/13. Will repeat Lipase. Also obtain UA C/S and Amylase. Norco bid for pain for now. Continue PPI. Pain is localized in the posterior lateral lower left rib cage. X-ray left ribs.

## 2013-08-17 NOTE — Assessment & Plan Note (Signed)
akesLevothyroxine 60mcg 08/18/12, f/u TSH 3.86 11/09/12. Update TSH

## 2013-08-18 DIAGNOSIS — R079 Chest pain, unspecified: Secondary | ICD-10-CM | POA: Diagnosis not present

## 2013-08-18 DIAGNOSIS — I10 Essential (primary) hypertension: Secondary | ICD-10-CM | POA: Diagnosis not present

## 2013-08-19 LAB — CBC AND DIFFERENTIAL
HEMATOCRIT: 37 % (ref 36–46)
HEMOGLOBIN: 13.3 g/dL (ref 12.0–16.0)
Platelets: 216 10*3/uL (ref 150–399)
WBC: 4.1 10^3/mL

## 2013-08-20 ENCOUNTER — Encounter: Payer: Self-pay | Admitting: Internal Medicine

## 2013-08-20 ENCOUNTER — Non-Acute Institutional Stay: Payer: Medicare Other | Admitting: Internal Medicine

## 2013-08-20 DIAGNOSIS — K859 Acute pancreatitis without necrosis or infection, unspecified: Secondary | ICD-10-CM

## 2013-08-20 DIAGNOSIS — R109 Unspecified abdominal pain: Secondary | ICD-10-CM | POA: Diagnosis not present

## 2013-08-20 DIAGNOSIS — IMO0002 Reserved for concepts with insufficient information to code with codable children: Secondary | ICD-10-CM | POA: Diagnosis not present

## 2013-08-20 DIAGNOSIS — I10 Essential (primary) hypertension: Secondary | ICD-10-CM | POA: Diagnosis not present

## 2013-08-20 NOTE — Progress Notes (Signed)
Patient ID: Melissa Carroll, female   DOB: 06/23/1916, 80 y.o.   MRN: [id number]    Location:  [facility]   Place of Service: ALF (13)  PCP: Estill Dooms, MD  Code Status: LIVING WILL, HCPOA  Extended Emergency Contact Information Primary Emergency Contact: [place] Phone: [phone] Mobile Phone: [phone] Relation: Daughter Secondary Emergency Contact: Jennet [place] Phone: [phone] Relation: Grandson  No Known Allergies  Chief Complaint  Patient presents with  . Hospitalization Follow-up    Readmit to AL following hospitalization    HPI:  Patient was hospitalized 08/14/13 and 07/15/13 for abdominal pain. She was found to have an acute pancreatitis. Lipase was 81. Ultrasound of the abdomen showed hepatic steatosis, but was otherwise unremarkable. CT of the abdomen had the same findings.  She has had persistent back discomfort related to a fracture at T12 vertebra.  Abdominal pains improved overnight while she was in the hospital, but she is complaining of abdominal distress and back pains today. Tramadol has been used for pain relief. She also has order for hydrocodone.    Past Medical History  Diagnosis Date  . Arthritis   . Osteoarthritis   . Hypertension   . Cataracts, bilateral   . Closed fracture of unspecified part of lower end of humerus   . Retinal neovascularization NOS   . Macular degeneration (senile) of retina, unspecified   . Insomnia, unspecified   . Unspecified hypothyroidism   . Unspecified hearing loss   . Osteoarthrosis, unspecified whether generalized or localized, unspecified site   . Senile osteoporosis   . Urinary tract infection 08/10/2013  . Vertebral fracture 08/09/2013    T7 10%, T12 70%   . Subarachnoid hemorrhage following injury 03/31/2012  . Pancreatitis, acute 08/14/2013    Past Surgical History  Procedure Laterality Date  . Ankle fracture surgery      . Abdominal hysterectomy    . Closed reduction with humeral pin insertion  04/01/2012    Procedure: CLOSED REDUCTION WITH HUMERAL PIN INSERTION;  Surgeon: Schuyler Amor, MD;  Location: [facility];  Service: Orthopedics;  Laterality: Left;      PAST PROCEDURES 08/14/13 CT abd: Atherosclerotic calcifications within the aorta, mesenteric, and iliac vessels no evidence of abdominal aortic aneurysm.   Diffuse low attenuation within the liver parenchyma otherwise negative. Gallbladder and gallbladder fossa unremarkable. The spleen, adrenals, pancreas are unremarkable. Small subcentimeter nonenhancing low attenuating foci within the right and left kidneys, stable, consistent with multiple cysts. The kidneys otherwise unremarkable.   There is diffuse diverticulosis within the sigmoid colon. The bowel is otherwise negative.   No abdominal or pelvic masses, free fluid, loculated fluid collections, no adenopathy appreciated. Stable small left ovarian cyst.   Multilevel spondylosis within the thoracolumbar spine. Chronic compression deformity involving T12. No aggressive osseous lesions.   No abdominal wall or inguinal hernia appreciated.   IMPRESSION: 1. Atelectasis versus scarring within the lung bases. 2. Stable chronic findings within the abdomen without evidence of focal or acute abnormalities. No CT findings accounting for the patient's clinical presentation. 3. Mild hepatic steatosis  08/14/12 [place] abd: Hepatic steatosis. No acute abnormalities. Specifically, no gallstones or biliary ductal dilatation.    Social History: History   Social History  . Marital Status: Widowed    Spouse Name: N/A    Number of Children: N/A  . Years of Education: N/A   Social History Main Topics  . Smoking status:  Never Smoker   . Smokeless tobacco: Not on file  . Alcohol Use: No  . Drug Use: No  . Sexual Activity: No   Other Topics Concern  . Not on file   Social History Narrative    Lives at [facility]   Widowed          Family History Family Status  Relation Status Death Age  . Daughter Alive    No family history on file.   Medications: Patient's Medications  New Prescriptions   No medications on file  Previous Medications   ACETAMINOPHEN (TYLENOL) 500 MG TABLET    Take 500 mg by mouth every 6 (six) hours as needed. Pain   AMLODIPINE (NORVASC) 10 MG TABLET    Take 10 mg by mouth daily.   ASPIRIN EC 81 MG TABLET    Take 81 mg by mouth daily.   BETA CAROTENE W/MINERALS (OCUVITE) TABLET    Take 1 tablet by mouth 2 (two) times daily.    CHOLECALCIFEROL (VITAMIN D) 1000 UNITS TABLET    Take 1,000 Units by mouth daily.   HYDROCODONE-ACETAMINOPHEN (NORCO/VICODIN) 5-325 MG PER TABLET    Take 1 tablet by mouth 2 (two) times daily.   LEVOTHYROXINE (SYNTHROID, LEVOTHROID) 50 MCG TABLET    Take by mouth daily   LOSARTAN (COZAAR) 100 MG TABLET    Take 100 mg by mouth daily.   METOPROLOL (LOPRESSOR) 50 MG TABLET    Take 50 mg by mouth. Take 7m twice daily   MULTIPLE VITAMIN (MULTIVITAMIN WITH MINERALS) TABS    Take 1 tablet by mouth daily.   OMEPRAZOLE (PRILOSEC) 40 MG CAPSULE    Take 1 capsule (40 mg total) by mouth daily.   POLYETHYL GLYCOL-PROPYL GLYCOL (SYSTANE) 0.4-0.3 % SOLN    Apply to eye. Two drops both eyes twice daily for dry eyes   RALOXIFENE (EVISTA) 60 MG TABLET    Take 60 mg by mouth daily.   SENNA-DOCUSATE (SENOKOT-S) 8.6-50 MG PER TABLET    Take 2 tablets by mouth at bedtime.   TRAMADOL (ULTRAM) 50 MG TABLET    Take 1 tablet (50 mg total) by mouth every 6 (six) hours as needed (pain).   ZOLPIDEM (AMBIEN) 5 MG TABLET    Take one tablet by mouth at bedtime as needed for insomnia. Do not give before 9pm  Modified Medications   No medications on file  Discontinued Medications   No medications on file    Immunization History  Administered Date(s) Administered  . Influenza-Unspecified 03/02/2013  . PPD Test 03/23/2012     Review of Systems    Constitutional: Negative for fever, chills, diaphoresis, activity change, appetite change, fatigue and unexpected weight change.  HENT: Positive for hearing loss. Negative for congestion, ear discharge, postnasal drip, rhinorrhea, trouble swallowing and voice change.   Eyes: Negative for pain, discharge, itching and visual disturbance.  Respiratory: Negative for cough, choking, chest tightness, shortness of breath and wheezing.   Cardiovascular: Negative for chest pain, palpitations and leg swelling.  Gastrointestinal: Positive for abdominal pain (upper abd across the abd). Negative for nausea, vomiting, diarrhea and constipation.  Endocrine: Negative for cold intolerance, heat intolerance, polydipsia, polyphagia and polyuria.       Has been treated for hypothyroidism  Genitourinary: Negative for dysuria, urgency, frequency and flank pain.  Musculoskeletal: Positive for arthralgias (left arm), back pain and gait problem. Negative for neck pain and neck stiffness.  Skin: Negative for pallor, rash and wound.  Allergic/Immunologic: Negative.  Neurological: Negative for tremors, syncope, speech difficulty, weakness, numbness and headaches.  Hematological: Negative.   Psychiatric/Behavioral: Positive for confusion (occasionally) and sleep disturbance. Negative for hallucinations, behavioral problems, dysphoric mood and agitation. The patient is not nervous/anxious.       Filed Vitals:   08/20/13 1150  BP: 158/86  Pulse: 72  Temp: 98.2 F (36.8 C)  Resp: 20  Height: '5\' 3"'  (1.6 m)  Weight: 156 lb (70.761 kg)   Physical Exam  Constitutional:  Frail, elderly  HENT:  Severe hearing loss  Eyes: Conjunctivae and EOM are normal. Pupils are equal, round, and reactive to light.  Neck: No JVD present. No thyromegaly present.  Cardiovascular: Normal rate, regular rhythm, normal heart sounds and intact distal pulses.  Exam reveals no gallop and no friction rub.   No murmur heard. Respiratory:  No respiratory distress. She has no wheezes. She has no rales. She exhibits no tenderness.  GI: Soft. Bowel sounds are normal. She exhibits no distension and no mass. There is no tenderness. There is no rebound and no guarding.  Musculoskeletal: Normal range of motion. She exhibits no edema and no tenderness.  Lymphadenopathy:    She has no cervical adenopathy.  Neurological: She is alert. She has normal reflexes. No cranial nerve deficit. Coordination normal.  forgetful  Skin: No rash noted. No erythema. No pallor.  Large ecchymosis medial right humereus  Psychiatric: She has a normal mood and affect. Her behavior is normal. Thought content normal.       Labs reviewed: Admission on 08/14/2013, Discharged on 08/15/2013  Component Date Value Ref Range Status  . WBC 08/14/2013 7.7  4.0 - 10.5 K/uL Final  . RBC 08/14/2013 4.59  3.87 - 5.11 MIL/uL Final  . Hemoglobin 08/14/2013 15.3* 12.0 - 15.0 g/dL Final  . HCT 08/14/2013 43.9  36.0 - 46.0 % Final  . MCV 08/14/2013 95.6  78.0 - 100.0 fL Final  . MCH 08/14/2013 33.3  26.0 - 34.0 pg Final  . MCHC 08/14/2013 34.9  30.0 - 36.0 g/dL Final  . RDW 08/14/2013 13.7  11.5 - 15.5 % Final  . Platelets 08/14/2013 201  150 - 400 K/uL Final  . Neutrophils Relative % 08/14/2013 47  43 - 77 % Final  . Neutro Abs 08/14/2013 3.6  1.7 - 7.7 K/uL Final  . Lymphocytes Relative 08/14/2013 41  12 - 46 % Final  . Lymphs Abs 08/14/2013 3.1  0.7 - 4.0 K/uL Final  . Monocytes Relative 08/14/2013 8  3 - 12 % Final  . Monocytes Absolute 08/14/2013 0.6  0.1 - 1.0 K/uL Final  . Eosinophils Relative 08/14/2013 4  0 - 5 % Final  . Eosinophils Absolute 08/14/2013 0.3  0.0 - 0.7 K/uL Final  . Basophils Relative 08/14/2013 1  0 - 1 % Final  . Basophils Absolute 08/14/2013 0.1  0.0 - 0.1 K/uL Final  . Sodium 08/14/2013 133* 137 - 147 mEq/L Final  . Potassium 08/14/2013 3.9  3.7 - 5.3 mEq/L Final  . Chloride 08/14/2013 94* 96 - 112 mEq/L Final  . CO2 08/14/2013 24   19 - 32 mEq/L Final  . Glucose, Bld 08/14/2013 149* 70 - 99 mg/dL Final  . BUN 08/14/2013 15  6 - 23 mg/dL Final  . Creatinine, Ser 08/14/2013 0.58  0.50 - 1.10 mg/dL Final  . Calcium 08/14/2013 9.8  8.4 - 10.5 mg/dL Final  . Total Protein 08/14/2013 7.6  6.0 - 8.3 g/dL Final  . Albumin 08/14/2013 3.7  3.5 - 5.2 g/dL Final  . AST 08/14/2013 22  0 - 37 U/L Final  . ALT 08/14/2013 22  0 - 35 U/L Final  . Alkaline Phosphatase 08/14/2013 61  39 - 117 U/L Final  . Total Bilirubin 08/14/2013 0.9  0.3 - 1.2 mg/dL Final  . GFR calc non Af Amer 08/14/2013 76* >90 mL/min Final  . GFR calc Af Amer 08/14/2013 89* >90 mL/min Final   Comment: (NOTE)                          The eGFR has been calculated using the CKD EPI equation.                          This calculation has not been validated in all clinical situations.                          eGFR's persistently <90 mL/min signify possible Chronic Kidney                          Disease.  . Lipase 08/14/2013 81* 11 - 59 U/L Final  . Color, Urine 08/14/2013 YELLOW  YELLOW Final  . APPearance 08/14/2013 CLEAR  CLEAR Final  . Specific Gravity, Urine 08/14/2013 1.008  1.005 - 1.030 Final  . pH 08/14/2013 7.0  5.0 - 8.0 Final  . Glucose, UA 08/14/2013 NEGATIVE  NEGATIVE mg/dL Final  . Hgb urine dipstick 08/14/2013 NEGATIVE  NEGATIVE Final  . Bilirubin Urine 08/14/2013 NEGATIVE  NEGATIVE Final  . Ketones, ur 08/14/2013 NEGATIVE  NEGATIVE mg/dL Final  . Protein, ur 08/14/2013 NEGATIVE  NEGATIVE mg/dL Final  . Urobilinogen, UA 08/14/2013 0.2  0.0 - 1.0 mg/dL Final  . Nitrite 08/14/2013 NEGATIVE  NEGATIVE Final  . Leukocytes, UA 08/14/2013 NEGATIVE  NEGATIVE Final   MICROSCOPIC NOT DONE ON URINES WITH NEGATIVE PROTEIN, BLOOD, LEUKOCYTES, NITRITE, OR GLUCOSE <1000 mg/dL.  . Lactic Acid, Venous 08/14/2013 3.3* 0.5 - 2.2 mmol/L Final  . Hemoglobin A1C 08/14/2013 6.0* <5.7 % Final   Comment: (NOTE)                                                                                                                          According to the ADA Clinical Practice Recommendations for 2011, when                          HbA1c is used as a screening test:                           >=6.5%   Diagnostic of Diabetes Mellitus                                    (  if abnormal result is confirmed)                          5.7-6.4%   Increased risk of developing Diabetes Mellitus                          References:Diagnosis and Classification of Diabetes Mellitus,Diabetes                          EHUD,1497,02(OVZCH 1):S62-S69 and Standards of Medical Care in                                  Diabetes - 2011,Diabetes YIFO,2774,12 (Suppl 1):S11-S61.  . Mean Plasma Glucose 08/14/2013 126* <117 mg/dL Final   Performed at [facility]  . Sodium 08/15/2013 133* 137 - 147 mEq/L Final  . Potassium 08/15/2013 3.6* 3.7 - 5.3 mEq/L Final  . Chloride 08/15/2013 97  96 - 112 mEq/L Final  . CO2 08/15/2013 23  19 - 32 mEq/L Final  . Glucose, Bld 08/15/2013 136* 70 - 99 mg/dL Final  . BUN 08/15/2013 12  6 - 23 mg/dL Final  . Creatinine, Ser 08/15/2013 0.52  0.50 - 1.10 mg/dL Final  . Calcium 08/15/2013 8.7  8.4 - 10.5 mg/dL Final  . Total Protein 08/15/2013 6.2  6.0 - 8.3 g/dL Final  . Albumin 08/15/2013 3.2* 3.5 - 5.2 g/dL Final  . AST 08/15/2013 19  0 - 37 U/L Final  . ALT 08/15/2013 19  0 - 35 U/L Final  . Alkaline Phosphatase 08/15/2013 52  39 - 117 U/L Final  . Total Bilirubin 08/15/2013 1.0  0.3 - 1.2 mg/dL Final  . GFR calc non Af Amer 08/15/2013 79* >90 mL/min Final  . GFR calc Af Amer 08/15/2013 >90  >90 mL/min Final   Comment: (NOTE)                          The eGFR has been calculated using the CKD EPI equation.                          This calculation has not been validated in all clinical situations.                          eGFR's persistently <90 mL/min signify possible Chronic Kidney                          Disease.  . WBC 08/15/2013 6.6  4.0 -  10.5 K/uL Final  . RBC 08/15/2013 4.03  3.87 - 5.11 MIL/uL Final  . Hemoglobin 08/15/2013 13.3  12.0 - 15.0 g/dL Final  . HCT 08/15/2013 38.1  36.0 - 46.0 % Final  . MCV 08/15/2013 94.5  78.0 - 100.0 fL Final  . MCH 08/15/2013 33.0  26.0 - 34.0 pg Final  . MCHC 08/15/2013 34.9  30.0 - 36.0 g/dL Final  . RDW 08/15/2013 13.7  11.5 - 15.5 % Final  . Platelets 08/15/2013 192  150 - 400 K/uL Final  . Lipase 08/15/2013 30  11 - 59 U/L Final  . Lactic Acid, Venous 08/15/2013 1.9  0.5 - 2.2 mmol/L Final  . Troponin I 08/15/2013 <  0.30  <0.30 ng/mL Final   Comment:                                 Due to the release kinetics of cTnI,                          a negative result within the first hours                          of the onset of symptoms does not rule out                          myocardial infarction with certainty.                          If myocardial infarction is still suspected,                          repeat the test at appropriate intervals.  Nursing Home on 08/10/2013  Component Date Value Ref Range Status  . Hemoglobin 03/29/2013 12.6  12.0 - 16.0 g/dL Final  . HCT 03/29/2013 36  36 - 46 % Final  . Platelets 03/29/2013 153  150 - 399 K/L Final  . WBC 03/29/2013 5.9   Final  . Glucose 03/29/2013 102   Final  . BUN 03/29/2013 21  4 - 21 mg/dL Final  . Creatinine 03/29/2013 0.6  0.5 - 1.1 mg/dL Final  . Potassium 03/29/2013 3.9  3.4 - 5.3 mmol/L Final  . Sodium 03/29/2013 138  137 - 147 mmol/L Final  . Alkaline Phosphatase 03/29/2013 39  25 - 125 U/L Final  . ALT 03/29/2013 14  7 - 35 U/L Final  . AST 03/29/2013 13  13 - 35 U/L Final  . Bilirubin, Total 03/29/2013 0.7   Final  . TSH 03/29/2013 4.73  0.41 - 5.90 uIU/mL Final     Assessment/Plan 1. Pancreatitis, acute Possible relapse of her pancreatitis. Bowel and fentanyl 50 mcg per hour patch. Recheck amylase, lipase, CMP.  2. Abdominal pain See above  3. Vertebral fracture Continues with back discomfort.  Fentanyl was added.  4. Hypertension Mild elevation in SBP. No adjustments in medications except to control pain better.

## 2013-08-23 DIAGNOSIS — R079 Chest pain, unspecified: Secondary | ICD-10-CM | POA: Diagnosis not present

## 2013-08-23 LAB — BASIC METABOLIC PANEL
BUN: 14 mg/dL (ref 4–21)
Creatinine: 0.5 mg/dL (ref 0.5–1.1)
Glucose: 108 mg/dL
POTASSIUM: 3.9 mmol/L (ref 3.4–5.3)
Sodium: 137 mmol/L (ref 137–147)

## 2013-08-24 DIAGNOSIS — H35059 Retinal neovascularization, unspecified, unspecified eye: Secondary | ICD-10-CM | POA: Diagnosis not present

## 2013-08-24 DIAGNOSIS — H3530 Unspecified macular degeneration: Secondary | ICD-10-CM | POA: Diagnosis not present

## 2013-08-24 DIAGNOSIS — H353290 Exudative age-related macular degeneration, unspecified eye, stage unspecified: Secondary | ICD-10-CM | POA: Diagnosis not present

## 2013-08-24 DIAGNOSIS — H35359 Cystoid macular degeneration, unspecified eye: Secondary | ICD-10-CM | POA: Diagnosis not present

## 2013-08-25 ENCOUNTER — Encounter: Payer: Self-pay | Admitting: *Deleted

## 2013-08-30 ENCOUNTER — Other Ambulatory Visit: Payer: Self-pay | Admitting: *Deleted

## 2013-08-30 MED ORDER — HYDROCODONE-ACETAMINOPHEN 5-325 MG PO TABS: ORAL_TABLET | ORAL | Status: DC

## 2013-08-30 NOTE — Telephone Encounter (Signed)
[facility] Fax Request

## 2013-09-09 DIAGNOSIS — N390 Urinary tract infection, site not specified: Secondary | ICD-10-CM | POA: Diagnosis not present

## 2013-09-11 ENCOUNTER — Emergency Department (HOSPITAL_COMMUNITY): Payer: Medicare Other

## 2013-09-11 ENCOUNTER — Encounter (HOSPITAL_COMMUNITY): Payer: Self-pay | Admitting: Emergency Medicine

## 2013-09-11 ENCOUNTER — Emergency Department (HOSPITAL_COMMUNITY)
Admission: EM | Admit: 2013-09-11 | Discharge: 2013-09-11 | Disposition: A | Discharge status: Home / Self Care | Payer: Medicare Other | Attending: Emergency Medicine | Admitting: Emergency Medicine

## 2013-09-11 DIAGNOSIS — M4000 Postural kyphosis, site unspecified: Secondary | ICD-10-CM | POA: Insufficient documentation

## 2013-09-11 DIAGNOSIS — Z79899 Other long term (current) drug therapy: Secondary | ICD-10-CM | POA: Insufficient documentation

## 2013-09-11 DIAGNOSIS — M1990 Unspecified osteoarthritis, unspecified site: Secondary | ICD-10-CM | POA: Insufficient documentation

## 2013-09-11 DIAGNOSIS — R1012 Left upper quadrant pain: Secondary | ICD-10-CM | POA: Insufficient documentation

## 2013-09-11 DIAGNOSIS — M810 Age-related osteoporosis without current pathological fracture: Secondary | ICD-10-CM | POA: Insufficient documentation

## 2013-09-11 DIAGNOSIS — G40909 Epilepsy, unspecified, not intractable, without status epilepticus: Secondary | ICD-10-CM | POA: Diagnosis not present

## 2013-09-11 DIAGNOSIS — R609 Edema, unspecified: Secondary | ICD-10-CM | POA: Insufficient documentation

## 2013-09-11 DIAGNOSIS — S298XXA Other specified injuries of thorax, initial encounter: Secondary | ICD-10-CM | POA: Diagnosis not present

## 2013-09-11 DIAGNOSIS — M79609 Pain in unspecified limb: Secondary | ICD-10-CM | POA: Diagnosis not present

## 2013-09-11 DIAGNOSIS — I10 Essential (primary) hypertension: Secondary | ICD-10-CM | POA: Diagnosis not present

## 2013-09-11 DIAGNOSIS — H9190 Unspecified hearing loss, unspecified ear: Secondary | ICD-10-CM | POA: Insufficient documentation

## 2013-09-11 DIAGNOSIS — Y9210 Unspecified residential institution as the place of occurrence of the external cause: Secondary | ICD-10-CM | POA: Insufficient documentation

## 2013-09-11 DIAGNOSIS — R6889 Other general symptoms and signs: Secondary | ICD-10-CM | POA: Diagnosis not present

## 2013-09-11 DIAGNOSIS — R296 Repeated falls: Secondary | ICD-10-CM | POA: Insufficient documentation

## 2013-09-11 DIAGNOSIS — Y9389 Activity, other specified: Secondary | ICD-10-CM | POA: Insufficient documentation

## 2013-09-11 DIAGNOSIS — G4700 Insomnia, unspecified: Secondary | ICD-10-CM | POA: Insufficient documentation

## 2013-09-11 DIAGNOSIS — W19XXXA Unspecified fall, initial encounter: Secondary | ICD-10-CM

## 2013-09-11 DIAGNOSIS — Z87820 Personal history of traumatic brain injury: Secondary | ICD-10-CM | POA: Diagnosis not present

## 2013-09-11 DIAGNOSIS — Z8781 Personal history of (healed) traumatic fracture: Secondary | ICD-10-CM | POA: Insufficient documentation

## 2013-09-11 DIAGNOSIS — R079 Chest pain, unspecified: Secondary | ICD-10-CM | POA: Diagnosis not present

## 2013-09-11 DIAGNOSIS — T1490XA Injury, unspecified, initial encounter: Secondary | ICD-10-CM | POA: Diagnosis not present

## 2013-09-11 DIAGNOSIS — M25559 Pain in unspecified hip: Secondary | ICD-10-CM | POA: Insufficient documentation

## 2013-09-11 DIAGNOSIS — Z8719 Personal history of other diseases of the digestive system: Secondary | ICD-10-CM | POA: Diagnosis not present

## 2013-09-11 DIAGNOSIS — R0789 Other chest pain: Secondary | ICD-10-CM | POA: Diagnosis not present

## 2013-09-11 DIAGNOSIS — E039 Hypothyroidism, unspecified: Secondary | ICD-10-CM | POA: Diagnosis not present

## 2013-09-11 DIAGNOSIS — Y998 Other external cause status: Secondary | ICD-10-CM | POA: Insufficient documentation

## 2013-09-11 DIAGNOSIS — Z7982 Long term (current) use of aspirin: Secondary | ICD-10-CM | POA: Insufficient documentation

## 2013-09-11 DIAGNOSIS — G319 Degenerative disease of nervous system, unspecified: Secondary | ICD-10-CM | POA: Insufficient documentation

## 2013-09-11 DIAGNOSIS — S0990XA Unspecified injury of head, initial encounter: Secondary | ICD-10-CM | POA: Diagnosis not present

## 2013-09-11 DIAGNOSIS — IMO0002 Reserved for concepts with insufficient information to code with codable children: Secondary | ICD-10-CM | POA: Diagnosis not present

## 2013-09-11 DIAGNOSIS — Z87440 Personal history of urinary (tract) infections: Secondary | ICD-10-CM | POA: Insufficient documentation

## 2013-09-11 DIAGNOSIS — R071 Chest pain on breathing: Secondary | ICD-10-CM | POA: Diagnosis not present

## 2013-09-11 MED ORDER — OXYCODONE-ACETAMINOPHEN 5-325 MG PO TABS
1.0000 | ORAL_TABLET | Freq: Once | ORAL | Status: AC
  Administered 2013-09-11: 1 via ORAL
  Filled 2013-09-11: qty 1

## 2013-09-11 MED ORDER — ACETAMINOPHEN 325 MG PO TABS
650.0000 mg | ORAL_TABLET | Freq: Once | ORAL | Status: AC
  Administered 2013-09-11: 650 mg via ORAL
  Filled 2013-09-11: qty 2

## 2013-09-11 NOTE — ED Notes (Signed)
Pt here from [facility] for a fall. Pt takes ambien at night to sleep, does not remember the fall, if she hit her head, or how she fell. Pt initially complained of rt side pain, now c/o of left side pain and holding her left side. Pt also has some swelling to her legs.

## 2013-09-11 NOTE — ED Notes (Signed)
Notified [facility] for transportation back home 

## 2013-09-11 NOTE — ED Provider Notes (Signed)
CSN: [id number]     Arrival date & time 09/11/13  0418 History   First MD Initiated Contact with Patient 09/11/13 0435     Chief Complaint  Patient presents with  . Fall     (Consider location/radiation/quality/duration/timing/severity/associated sxs/prior Treatment) HPI Patient is a 80 yo woman who was BIB EMS from the [facility] after a fall. The patient takes Ambien to sleep. She does not recall the fall. Her only complaint of pain is in the left lower anterior midline chest. She says it feels like she has a bruise there. Mild and nonradiating. Worse with palpation.  She denies headache, chest pain, SOB, extremity pain.   Past Medical History  Diagnosis Date  . Arthritis   . Osteoarthritis   . Hypertension   . Cataracts, bilateral   . Closed fracture of unspecified part of lower end of humerus   . Retinal neovascularization NOS   . Macular degeneration (senile) of retina, unspecified   . Insomnia, unspecified   . Unspecified hypothyroidism   . Unspecified hearing loss   . Osteoarthrosis, unspecified whether generalized or localized, unspecified site   . Senile osteoporosis   . Urinary tract infection 08/10/2013  . Vertebral fracture 08/09/2013    T7 10%, T12 70%   . Subarachnoid hemorrhage following injury 03/31/2012  . Pancreatitis, acute 08/14/2013   Past Surgical History  Procedure Laterality Date  . Ankle fracture surgery    . Abdominal hysterectomy    . Closed reduction with humeral pin insertion  04/01/2012    Procedure: CLOSED REDUCTION WITH HUMERAL PIN INSERTION;  Surgeon: Schuyler Amor, MD;  Location: [facility];  Service: Orthopedics;  Laterality: Left;   History reviewed. No pertinent family history. History  Substance Use Topics  . Smoking status: Never Smoker   . Smokeless tobacco: Not on file  . Alcohol Use: No   OB History   Grav Para Term Preterm Abortions TAB SAB Ect Mult Living                 Review of Systems Ten point review of symptoms  performed and is negative with the exception of symptoms noted above & chronic hearing loss.     Allergies  Review of patient's allergies indicates no known allergies.  Home Medications   Prior to Admission medications   Medication Sig Start Date End Date Taking? Authorizing Provider  acetaminophen (TYLENOL) 500 MG tablet Take 500 mg by mouth every 6 (six) hours as needed. Pain    Historical Provider, MD  amLODipine (NORVASC) 10 MG tablet Take 10 mg by mouth daily.    Historical Provider, MD  aspirin EC 81 MG tablet Take 81 mg by mouth daily.    Historical Provider, MD  beta carotene w/minerals (OCUVITE) tablet Take 1 tablet by mouth 2 (two) times daily.     Historical Provider, MD  cholecalciferol (VITAMIN D) 1000 UNITS tablet Take 1,000 Units by mouth daily.    Historical Provider, MD  HYDROcodone-acetaminophen (NORCO/VICODIN) 5-325 MG per tablet Take one tablet by mouth twice daily routinely for pain 08/30/13   Tiffany L Reed, DO  levothyroxine (SYNTHROID, LEVOTHROID) 50 MCG tablet Take by mouth daily 08/18/12   Man X Mast, NP  losartan (COZAAR) 100 MG tablet Take 100 mg by mouth daily.    Historical Provider, MD  metoprolol succinate (TOPROL-XL) 25 MG 24 hr tablet Take 75 mg by mouth 2 (two) times daily. Hold for blood pressure (100/70 or HR 55)  Historical Provider, MD  Multiple Vitamin (MULTIVITAMIN WITH MINERALS) TABS Take 1 tablet by mouth daily.    Historical Provider, MD  omeprazole (PRILOSEC) 40 MG capsule Take 1 capsule (40 mg total) by mouth daily. 08/15/13   Kinnie Feil, MD  Polyethyl Glycol-Propyl Glycol (SYSTANE) 0.4-0.3 % SOLN Place 2 drops into both eyes 2 (two) times daily. For dry eyes    Historical Provider, MD  raloxifene (EVISTA) 60 MG tablet Take 60 mg by mouth daily.    Historical Provider, MD  senna-docusate (SENOKOT-S) 8.6-50 MG per tablet Take 2 tablets by mouth at bedtime.    Historical Provider, MD  traMADol (ULTRAM) 50 MG tablet Take 1 tablet (50 mg total) by  mouth every 6 (six) hours as needed (pain). 08/15/13   Kinnie Feil, MD  zolpidem (AMBIEN) 5 MG tablet Take one tablet by mouth at bedtime as needed for insomnia. Do not give before 9pm 05/10/13   Tiffany L Reed, DO   BP 171/78  Pulse 79  Temp(Src) 97.8 F (36.6 C) (Oral)  SpO2 97% Physical Exam Gen: well developed and well nourished appearing Head: NCAT Eyes: PERL, EOMI Nose: no epistaixis or rhinorrhea Mouth/throat: mucosa is moist and pink Neck: supple, c spine is nontender and there is full and painless ROM.  Lungs: CTA B, no wheezing, rhonchi or rales CV: RRR, no murmur, extremities appear well perfused.  Chest wall:  ttp over the medial aspect of 8, 9, 10 ribs Abd: soft, notender, nondistended Back: marked kyphosis but, no ttp Skin: warm and dry Ext: normal to inspection, bilateral lower ext dependent edema, FROM without pain at all 4 extremities.  Neuro: CN ii-xii grossly intact, no focal deficits Psyche; normal affect,  calm and cooperative.   ED Course  Procedures (including critical care time) Labs Review  Imaging Results        DG Pelvis 1-2 Views (Final result)  Result time: 09/11/13 06:27:48    Procedure changed from [facility] Pelvis Portable       Final result by Rad Results In Interface (09/11/13 06:27:48)    Narrative:   CLINICAL DATA: Right hip pain after a fall.  EXAM: PELVIS - 1-2 VIEW  COMPARISON: CT ABD/PELVIS W CM dated 08/14/2013; DG LUMBAR SPINE 2-3V dated 10/11/2008  FINDINGS: There is no evidence of pelvic fracture or diastasis. No other pelvic bone lesions are seen. Vascular calcifications. Examination is technically limited due to image artifact.  IMPRESSION: No displaced right hip fractures identified.   Electronically Signed By: Lucienne Capers M.D. On: 09/11/2013 06:27             DG Chest 2 View (Final result)  Result time: 09/11/13 06:26:54    Procedure changed from [facility] Chest Portable 2 Views       Final result by Rad  Results In Interface (09/11/13 62:95:28)    Narrative:   CLINICAL DATA: Fall with anterior bilateral chest pain and right hip pain.  EXAM: CHEST 2 VIEW  COMPARISON: DG CHEST 2 VIEW dated 08/14/2013  FINDINGS: Normal heart size and pulmonary vascularity. Tortuous and calcified aorta. Vascular shadows along the right paratracheal region. Probable emphysematous changes and scattered fibrosis in the lungs. No focal airspace disease or consolidation. No pneumothorax. Visualized ribs are not displaced. Degenerative changes in the spine and shoulders. Compression of a mid and lower thoracic vertebral bodies. The mid thoracic vertebral body is probably T6 and demonstrates slightly increased loss of height since the previous study. T11 compression appears stable.  IMPRESSION:  Emphysematous changes in the lungs. No evidence of active pulmonary disease. Thoracic vertebral compression fractures with C6 fracture demonstrating mild progression since previous study appear   Electronically Signed By: Lucienne Capers M.D. On: 09/11/2013 06:26             CT Head Wo Contrast (Final result)  Result time: 09/11/13 05:57:51    Final result by Rad Results In Interface (09/11/13 05:57:51)    Narrative:   CLINICAL DATA: Left upper abdomen pain and right arm pain after a fall.  EXAM: CT HEAD WITHOUT CONTRAST  TECHNIQUE: Contiguous axial images were obtained from the base of the skull through the vertex without intravenous contrast.  COMPARISON: CT HEAD W/O CM dated 05/29/2012  FINDINGS: Diffuse atrophy. Ventricular dilatation consistent with central atrophy. Low-attenuation changes throughout the deep white matter consistent with small vessel ischemia. Old lacunar infarcts in the left cerebellum. No mass effect or midline shift. No abnormal extra-axial fluid collections. Gray-white matter junctions are distinct. Basal cisterns are not effaced. No evidence of acute intracranial  hemorrhage. No depressed skull fractures. Visualized paranasal sinuses and mastoid air cells are not opacified.  IMPRESSION: No acute intracranial abnormalities. Chronic atrophy and small vessel ischemic changes. No change since prior study.   Electronically Signed By: Lucienne Capers M.D. On: 09/11/2013 05:57          MDM   No acute traumatic injuries identified. Patient stable for discharge.      Elyn Peers, MD 09/11/13 [id number]

## 2013-09-11 NOTE — ED Notes (Signed)
Pt keeps getting out of bed and opening door. Informed numerous times to use her call light and not to get out of bed. Pt reports she is still having some pain. MD notified.

## 2013-09-11 NOTE — ED Notes (Signed)
Pt states she has pain in upper left quadrant, her back, and rt arm. Pt unable to rate pain.

## 2013-09-14 ENCOUNTER — Encounter: Payer: Self-pay | Admitting: Nurse Practitioner

## 2013-09-14 ENCOUNTER — Non-Acute Institutional Stay: Payer: Medicare Other | Admitting: Nurse Practitioner

## 2013-09-14 DIAGNOSIS — K5900 Constipation, unspecified: Secondary | ICD-10-CM

## 2013-09-14 DIAGNOSIS — E039 Hypothyroidism, unspecified: Secondary | ICD-10-CM

## 2013-09-14 DIAGNOSIS — R071 Chest pain on breathing: Secondary | ICD-10-CM | POA: Diagnosis not present

## 2013-09-14 DIAGNOSIS — G4700 Insomnia, unspecified: Secondary | ICD-10-CM

## 2013-09-14 DIAGNOSIS — K219 Gastro-esophageal reflux disease without esophagitis: Secondary | ICD-10-CM | POA: Insufficient documentation

## 2013-09-14 DIAGNOSIS — I10 Essential (primary) hypertension: Secondary | ICD-10-CM

## 2013-09-14 DIAGNOSIS — IMO0002 Reserved for concepts with insufficient information to code with codable children: Secondary | ICD-10-CM | POA: Diagnosis not present

## 2013-09-14 DIAGNOSIS — R0789 Other chest pain: Secondary | ICD-10-CM

## 2013-09-14 NOTE — Assessment & Plan Note (Signed)
Stable, takes Omeprazole 20mg daily.  

## 2013-09-14 NOTE — Assessment & Plan Note (Signed)
he stated she has been taking Ambien 5mg  for years. She sleeps well except occasional sleeplessness that she takes Tylenol to help with pain. She declined discontinuation of Ambien and desires Tylenol as needed. Will continue to monitor for AR of Ambien.

## 2013-09-14 NOTE — Assessment & Plan Note (Signed)
Pain: manage pain with Norco 5/325 bid and Fentanyl 50mcg/hr.   

## 2013-09-14 NOTE — Progress Notes (Signed)
Patient ID: Melissa Carroll, female   DOB: 06-06-1916, 80 y.o.   MRN: [id number]     Code Status: Full Code  No Known Allergies  Chief Complaint  Patient presents with  . Medical Management of Chronic Issues  . Acute Visit    s/p ED evaluation 09/12/24 for s/p fall and chest wall pain.     HPI: Patient is a 80 y.o. female seen in the AL at [facility]  today for evaluation of s/p ED evaluation 09/11/13 for fall and chest wall pain,  and other chronic medical conditions.   09/11/13 ED: s/p fall and chest wall pain:   08/14/13 ED: Abdominal pain Pancreatitis, acute  presented ED with a chief complaint of epigastric abdominal pain-Epigastric pain, patient reports L sided chest , back pains likely musculoskeletal in origin, with recent fall; h/o vertebral fracture; DJD C spine-CT/US abd: unremarkable; abd exam unremarkable; ECG, trop negative; cont pain control; started PPI empiric for 2 weeks    Saw Dr. Nyoka Cowden 08/10/13 for [place] 08/06/13. Sustained large ecchymosis on the medial right arm. Xrays of the spine showed compression fractures of T7 and T12. She is not very tender there, and this is likely to be old fractures not related to this fall.        Problem List Items Addressed This Visit   Hypertension     Controlled on Amlodipine 10mg  and Losartan 100mg  and Metoprolol 75mg  bid.      Unspecified hypothyroidism     takesLevothyroxine 26mcg 08/18/12,  TSH 1.748 08/17/13      Insomnia, unspecified     he stated she has been taking Ambien 5mg  for years. She sleeps well except occasional sleeplessness that she takes Tylenol to help with pain. She declined discontinuation of Ambien and desires Tylenol as needed. Will continue to monitor for AR of Ambien.      Unspecified constipation     Stable on Senokot S II nightly.       Vertebral fracture - Primary     Pain: manage pain with Norco 5/325 bid and Fentanyl 39mcg/hr.      Chest wall pain     Has been about 6 weeks. Failed Norco. added  Fentanyl patch 41mcg since 08/20/13-pain is better controlled, but not adequately-will adding Tylenol     GERD (gastroesophageal reflux disease)     Stable, takes Omeprazole 20mg  daily.        Review of Systems: Review of Systems  Constitutional: Negative for fever, chills, weight loss, malaise/fatigue and diaphoresis.  HENT: Positive for hearing loss. Negative for congestion, ear pain and sore throat.   Eyes: Negative for blurred vision, double vision, photophobia, pain, discharge and redness.  Respiratory: Negative for cough, sputum production and wheezing.   Cardiovascular: Negative for chest pain, orthopnea, claudication, leg swelling and PND.  Gastrointestinal: Positive for abdominal pain. Negative for heartburn, nausea, vomiting, diarrhea, constipation and blood in stool.       Was in upper epigastric area.   Genitourinary: Negative for dysuria, urgency, frequency, hematuria and flank pain.  Musculoskeletal: Positive for back pain. Negative for falls, joint pain, myalgias and neck pain.       Posterior lateral lower left rib cage pain.   Skin: Negative for itching and rash.       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  Endo/Heme/Allergies: Negative for environmental allergies and polydipsia. Does  not bruise/bleed easily.  Psychiatric/Behavioral: Positive for memory loss. Negative for depression and hallucinations. The patient has insomnia. The patient is not nervous/anxious.   \  Past Medical History  Diagnosis Date  . Arthritis   . Osteoarthritis   . Hypertension   . Cataracts, bilateral   . Closed fracture of unspecified part of lower end of humerus   . Retinal neovascularization NOS   . Macular degeneration (senile) of retina, unspecified   . Insomnia, unspecified   . Unspecified hypothyroidism   . Unspecified hearing loss   .  Osteoarthrosis, unspecified whether generalized or localized, unspecified site   . Senile osteoporosis   . Urinary tract infection 08/10/2013  . Vertebral fracture 08/09/2013    T7 10%, T12 70%   . Subarachnoid hemorrhage following injury 03/31/2012  . Pancreatitis, acute 08/14/2013   Past Surgical History  Procedure Laterality Date  . Ankle fracture surgery    . Abdominal hysterectomy    . Closed reduction with humeral pin insertion  04/01/2012    Procedure: CLOSED REDUCTION WITH HUMERAL PIN INSERTION;  Surgeon: Schuyler Amor, MD;  Location: [facility];  Service: Orthopedics;  Laterality: Left;   Social History:   reports that she has never smoked. She does not have any smokeless tobacco history on file. She reports that she does not drink alcohol or use illicit drugs.  No family history on file.  Medications:   Medication List       This list is accurate as of: 09/14/13 11:59 PM.  Always use your most recent med list.               acetaminophen 500 MG tablet  Commonly known as:  TYLENOL  Take 500 mg by mouth every 6 (six) hours as needed. Pain     amLODipine 10 MG tablet  Commonly known as:  NORVASC  Take 10 mg by mouth daily.     aspirin EC 81 MG tablet  Take 81 mg by mouth daily.     CERTAVITE/ANTIOXIDANTS PO  Take 1 tablet by mouth every morning.     PRESERVISION AREDS 2 PO  Take 1 capsule by mouth 2 (two) times daily.     fentaNYL 50 MCG/HR  Commonly known as:  DURAGESIC - dosed mcg/hr  Place 50 mcg onto the skin every 3 (three) days.     HYDROcodone-acetaminophen 5-325 MG per tablet  Commonly known as:  NORCO/VICODIN  Take one tablet by mouth twice daily routinely for pain     levothyroxine 50 MCG tablet  Commonly known as:  SYNTHROID, LEVOTHROID  Take 50 mcg by mouth daily before breakfast.     losartan 100 MG tablet  Commonly known as:  COZAAR  Take 100 mg by mouth daily.     metoprolol tartrate 25 MG tablet  Commonly known as:  LOPRESSOR  Take  75 mg by mouth 2 (two) times daily.     omeprazole 20 MG capsule  Commonly known as:  PRILOSEC  Take 20 mg by mouth daily.     raloxifene 60 MG tablet  Commonly known as:  EVISTA  Take 60 mg by mouth daily.     senna-docusate 8.6-50 MG per tablet  Commonly known as:  Senokot-S  Take 2 tablets by mouth at bedtime.     SYSTANE 0.4-0.3 % Soln  Generic drug:  Polyethyl Glycol-Propyl Glycol  Place 2 drops into both eyes 2 (two) times daily as needed (dry eyes). For dry eyes  traMADol 50 MG tablet  Commonly known as:  ULTRAM  Take 1 tablet (50 mg total) by mouth every 6 (six) hours as needed (pain).     Vitamin D-3 1000 UNITS Caps  Take 1,000 Units by mouth every morning.     zolpidem 5 MG tablet  Commonly known as:  AMBIEN  Take 2.5 mg by mouth at bedtime as needed for sleep.          Physical Exam: Physical Exam  Constitutional: She is oriented to person, place, and time. She appears well-developed and well-nourished.  HENT:  Head: Normocephalic and atraumatic.  Eyes: Conjunctivae and EOM are normal. Pupils are equal, round, and reactive to light.  Neck: Normal range of motion. Neck supple.  Cardiovascular:  No murmur heard. Pulmonary/Chest: Effort normal and breath sounds normal.  Abdominal: Soft. Bowel sounds are normal.  Musculoskeletal: Normal range of motion. She exhibits tenderness. She exhibits no edema.  Posterior lateral left lower rib cage pain.   Neurological: She is alert and oriented to person, place, and time. She displays normal reflexes. No cranial nerve deficit. She exhibits normal muscle tone. Coordination normal.  Skin: Skin is warm and dry. No rash noted. No erythema.  Psychiatric: Her mood appears not anxious. Her affect is not angry, not labile and not inappropriate. Her speech is not rapid and/or pressured, not delayed and not slurred. She is not agitated, not aggressive, not slowed and not withdrawn. Thought content is not paranoid and not  delusional. Cognition and memory are impaired. She does not express impulsivity or inappropriate judgment. Depressed: flat affect. She exhibits abnormal recent memory.    Filed Vitals:   09/14/13 1501  BP: 162/70  Pulse: 80  Temp: 97.4 F (36.3 C)  TempSrc: Tympanic  Resp: 20      Labs reviewed: Basic Metabolic Panel:  Recent Labs  11/09/12 03/29/13 08/14/13 1605 08/15/13 0509 08/17/13 08/23/13  NA 138 138 133* 133*  --  137  K 4.0 3.9 3.9 3.6*  --  3.9  CL  --   --  94* 97  --   --   CO2  --   --  24 23  --   --   GLUCOSE  --   --  149* 136*  --   --   BUN 18 21 15 12   --  14  CREATININE 0.6 0.6 0.58 0.52  --  0.5  CALCIUM  --   --  9.8 8.7  --   --   TSH 3.86 4.73  --   --  1.75  --    Liver Function Tests:  Recent Labs  03/29/13 08/14/13 1605 08/15/13 0509  AST 13 22 19   ALT 14 22 19   ALKPHOS 39 61 52  BILITOT  --  0.9 1.0  PROT  --  7.6 6.2  ALBUMIN  --  3.7 3.2*    CBC:  Recent Labs  08/14/13 1605 08/15/13 0509 08/19/13  WBC 7.7 6.6 4.1  NEUTROABS 3.6  --   --   HGB 15.3* 13.3 13.3  HCT 43.9 38.1 37  MCV 95.6 94.5  --   PLT 201 192 216    Past Procedures:  08/14/2013 CLINICAL DATA: Epigastric pain EXAM: CHEST 2 VIEW . IMPRESSION: No active cardiopulmonary disease.   US Abdomen Complete  08/15/2013 CLINICAL DATA: Abdominal pain. Elevated lipase. EXAM: ULTRASOUND ABDOMEN  IMPRESSION: Hepatic steatosis. No acute abnormalities. Specifically, no gallstones or biliary ductal dilatation.  Ct Abdomen Pelvis W  Contrast  08/14/2013 CLINICAL DATA: Abdominal pain EXAM: CT ABDOMEN AND PELVIS WITH CONTRAST IMPRESSION: 1. Atelectasis versus scarring within the lung bases. 2. Stable chronic findings within the abdomen without evidence of focal or acute abnormalities. No CT findings accounting for the patient's clinical presentation. 3. Mild hepatic steatosis  08/17/13 X-ray L ribs: no acute fracture or lytic lession involving the well visualized portions of the left  ribs.   Assessment/Plan Unspecified hypothyroidism takesLevothyroxine 74mcg 08/18/12,  TSH 1.748 08/17/13    Chest wall pain Has been about 6 weeks. Failed Norco. added Fentanyl patch 61mcg since 08/20/13-pain is better controlled, but not adequately-will adding Tylenol   Insomnia, unspecified he stated she has been taking Ambien 5mg  for years. She sleeps well except occasional sleeplessness that she takes Tylenol to help with pain. She declined discontinuation of Ambien and desires Tylenol as needed. Will continue to monitor for AR of Ambien.    Hypertension Controlled on Amlodipine 10mg  and Losartan 100mg  and Metoprolol 75mg  bid.    GERD (gastroesophageal reflux disease) Stable, takes Omeprazole 20mg  daily.   Unspecified constipation Stable on Senokot S II nightly.     Vertebral fracture Pain: manage pain with Norco 5/325 bid and Fentanyl 76mcg/hr.      Family/ Staff Communication: observe the patient.   Goals of Care: AL  Labs/tests ordered: urine culture 09/10/13 showed no growth.

## 2013-09-14 NOTE — Assessment & Plan Note (Signed)
takesLevothyroxine 44mcg 08/18/12,  TSH 1.748 08/17/13

## 2013-09-14 NOTE — Assessment & Plan Note (Signed)
Stable on Senokot S II nightly.   

## 2013-09-14 NOTE — Assessment & Plan Note (Addendum)
Has been about 6 weeks. Failed Norco. added Fentanyl patch 25mcg since 08/20/13-pain is better controlled, but not adequately-will adding Tylenol

## 2013-09-14 NOTE — Assessment & Plan Note (Signed)
Controlled on Amlodipine 10mg and Losartan 100mg and Metoprolol 75mg bid.    

## 2013-09-17 ENCOUNTER — Other Ambulatory Visit: Payer: Self-pay | Admitting: *Deleted

## 2013-09-17 MED ORDER — FENTANYL 50 MCG/HR TD PT72: 50.0000 ug | MEDICATED_PATCH | TRANSDERMAL | Status: DC

## 2013-09-17 NOTE — Telephone Encounter (Signed)
[facility] [place] 

## 2013-09-23 DIAGNOSIS — M6281 Muscle weakness (generalized): Secondary | ICD-10-CM | POA: Diagnosis not present

## 2013-09-23 DIAGNOSIS — M2550 Pain in unspecified joint: Secondary | ICD-10-CM | POA: Diagnosis not present

## 2013-09-24 ENCOUNTER — Encounter: Payer: Self-pay | Admitting: *Deleted

## 2013-09-27 DIAGNOSIS — M6281 Muscle weakness (generalized): Secondary | ICD-10-CM | POA: Diagnosis not present

## 2013-09-27 DIAGNOSIS — M2550 Pain in unspecified joint: Secondary | ICD-10-CM | POA: Diagnosis not present

## 2013-09-28 ENCOUNTER — Other Ambulatory Visit: Payer: Self-pay | Admitting: *Deleted

## 2013-09-28 MED ORDER — HYDROCODONE-ACETAMINOPHEN 5-325 MG PO TABS: ORAL_TABLET | ORAL | Status: DC

## 2013-09-28 NOTE — Telephone Encounter (Signed)
[facility] 

## 2013-09-29 DIAGNOSIS — M6281 Muscle weakness (generalized): Secondary | ICD-10-CM | POA: Diagnosis not present

## 2013-09-29 DIAGNOSIS — M2550 Pain in unspecified joint: Secondary | ICD-10-CM | POA: Diagnosis not present

## 2013-10-04 DIAGNOSIS — M6281 Muscle weakness (generalized): Secondary | ICD-10-CM | POA: Diagnosis not present

## 2013-10-04 DIAGNOSIS — M2550 Pain in unspecified joint: Secondary | ICD-10-CM | POA: Diagnosis not present

## 2013-10-05 DIAGNOSIS — H353290 Exudative age-related macular degeneration, unspecified eye, stage unspecified: Secondary | ICD-10-CM | POA: Diagnosis not present

## 2013-10-06 DIAGNOSIS — L821 Other seborrheic keratosis: Secondary | ICD-10-CM | POA: Diagnosis not present

## 2013-10-06 DIAGNOSIS — L819 Disorder of pigmentation, unspecified: Secondary | ICD-10-CM | POA: Diagnosis not present

## 2013-10-06 DIAGNOSIS — L570 Actinic keratosis: Secondary | ICD-10-CM | POA: Diagnosis not present

## 2013-10-11 DIAGNOSIS — M2550 Pain in unspecified joint: Secondary | ICD-10-CM | POA: Diagnosis not present

## 2013-10-11 DIAGNOSIS — M6281 Muscle weakness (generalized): Secondary | ICD-10-CM | POA: Diagnosis not present

## 2013-10-15 ENCOUNTER — Other Ambulatory Visit: Payer: Self-pay | Admitting: *Deleted

## 2013-10-15 MED ORDER — FENTANYL 50 MCG/HR TD PT72: 50.0000 ug | MEDICATED_PATCH | TRANSDERMAL | Status: DC

## 2013-10-15 NOTE — Telephone Encounter (Signed)
[facility]

## 2013-11-17 ENCOUNTER — Other Ambulatory Visit: Payer: Self-pay | Admitting: *Deleted

## 2013-11-17 MED ORDER — FENTANYL 50 MCG/HR TD PT72: 50.0000 ug | MEDICATED_PATCH | TRANSDERMAL | Status: DC

## 2013-11-17 NOTE — Telephone Encounter (Signed)
[facility] Fax Order 

## 2013-11-30 DIAGNOSIS — H3530 Unspecified macular degeneration: Secondary | ICD-10-CM | POA: Diagnosis not present

## 2013-11-30 DIAGNOSIS — H35059 Retinal neovascularization, unspecified, unspecified eye: Secondary | ICD-10-CM | POA: Diagnosis not present

## 2013-11-30 DIAGNOSIS — H35359 Cystoid macular degeneration, unspecified eye: Secondary | ICD-10-CM | POA: Diagnosis not present

## 2013-11-30 DIAGNOSIS — H353290 Exudative age-related macular degeneration, unspecified eye, stage unspecified: Secondary | ICD-10-CM | POA: Diagnosis not present

## 2013-12-24 ENCOUNTER — Other Ambulatory Visit: Payer: Self-pay | Admitting: Nurse Practitioner

## 2013-12-24 DIAGNOSIS — R0789 Other chest pain: Secondary | ICD-10-CM

## 2013-12-24 DIAGNOSIS — G4700 Insomnia, unspecified: Secondary | ICD-10-CM

## 2013-12-25 DIAGNOSIS — M25579 Pain in unspecified ankle and joints of unspecified foot: Secondary | ICD-10-CM | POA: Diagnosis not present

## 2013-12-25 DIAGNOSIS — M79609 Pain in unspecified limb: Secondary | ICD-10-CM | POA: Diagnosis not present

## 2014-01-07 ENCOUNTER — Encounter: Payer: Self-pay | Admitting: Nurse Practitioner

## 2014-01-07 ENCOUNTER — Non-Acute Institutional Stay: Payer: Medicare Other | Admitting: Nurse Practitioner

## 2014-01-07 DIAGNOSIS — K5900 Constipation, unspecified: Secondary | ICD-10-CM

## 2014-01-07 DIAGNOSIS — E039 Hypothyroidism, unspecified: Secondary | ICD-10-CM | POA: Diagnosis not present

## 2014-01-07 DIAGNOSIS — G4700 Insomnia, unspecified: Secondary | ICD-10-CM | POA: Diagnosis not present

## 2014-01-07 DIAGNOSIS — R071 Chest pain on breathing: Secondary | ICD-10-CM

## 2014-01-07 DIAGNOSIS — K219 Gastro-esophageal reflux disease without esophagitis: Secondary | ICD-10-CM

## 2014-01-07 DIAGNOSIS — R413 Other amnesia: Secondary | ICD-10-CM | POA: Insufficient documentation

## 2014-01-07 DIAGNOSIS — M79672 Pain in left foot: Secondary | ICD-10-CM | POA: Insufficient documentation

## 2014-01-07 DIAGNOSIS — M79609 Pain in unspecified limb: Secondary | ICD-10-CM | POA: Diagnosis not present

## 2014-01-07 DIAGNOSIS — I10 Essential (primary) hypertension: Secondary | ICD-10-CM

## 2014-01-07 DIAGNOSIS — R0789 Other chest pain: Secondary | ICD-10-CM

## 2014-01-07 NOTE — Assessment & Plan Note (Signed)
Failed Norco. Controlled on Fentanyl patch 14mcg since 08/20/13 and Tylenol

## 2014-01-07 NOTE — Assessment & Plan Note (Signed)
[facility]

## 2014-01-07 NOTE — Assessment & Plan Note (Signed)
12/25/13 X-ray L foot and toes: no acute osseous abnormality, degenerative changes throughout the foot, moderate first metatarsophalangeal joint degenerative changes and hallux valgus deformity, possible age indeterminate erosive ahnges of teh medial border of the first metatarsal head may be consistent with gout.

## 2014-01-07 NOTE — Assessment & Plan Note (Signed)
she stated she has been taking Ambien 5mg  for years. She sleeps well except occasional sleeplessness that she takes Tylenol to help with pain. She declined discontinuation of Ambien and desires Tylenol as needed. Will continue to monitor for AR of Ambien. 12/24/13 GDR decreased Trazodone from 50mg  to 25mg  per consultant pharmacist recommendation.

## 2014-01-07 NOTE — Assessment & Plan Note (Signed)
Stable on Senokot S II nightly.   

## 2014-01-07 NOTE — Progress Notes (Signed)
Patient ID: Melissa Carroll, female   DOB: Jul 06, 1916, 80 y.o.   MRN: [id number]     Code Status: Full Code  No Known Allergies  Chief Complaint  Patient presents with  . Medical Management of Chronic Issues  . Acute Visit    left foot/toe pain.     HPI: Patient is a 80 y.o. female seen in the AL at [facility]  today for evaluation of left foot/toe pain, well managed chest wall pain,  and other chronic medical conditions.   09/11/13 ED: s/p fall and chest wall pain:   08/14/13 ED: Abdominal pain Pancreatitis, acute  presented ED with a chief complaint of epigastric abdominal pain-Epigastric pain, patient reports L sided chest , back pains likely musculoskeletal in origin, with recent fall; h/o vertebral fracture; DJD C spine-CT/US abd: unremarkable; abd exam unremarkable; ECG, trop negative; cont pain control; started PPI empiric for 2 weeks    Saw Dr. Nyoka Cowden 08/10/13 for [place] 08/06/13. Sustained large ecchymosis on the medial right arm. Xrays of the spine showed compression fractures of T7 and T12. She is not very tender there, and this is likely to be old fractures not related to this fall.        Problem List Items Addressed This Visit   Chest wall pain      Failed Norco. Controlled on Fentanyl patch 6mcg since 08/20/13 and Tylenol      GERD (gastroesophageal reflux disease)     Stable, takes Omeprazole 20mg  daily.      Hypertension     Controlled on Amlodipine 10mg  and Losartan 100mg  and Metoprolol 75mg  bid.       Insomnia, unspecified     she stated she has been taking Ambien 5mg  for years. She sleeps well except occasional sleeplessness that she takes Tylenol to help with pain. She declined discontinuation of Ambien and desires Tylenol as needed. Will continue to monitor for AR of Ambien. 12/24/13 GDR decreased Trazodone from 50mg  to 25mg  per consultant pharmacist recommendation.        Left foot pain - Primary     12/25/13 X-ray L foot and toes: no acute osseous  abnormality, degenerative changes throughout the foot, moderate first metatarsophalangeal joint degenerative changes and hallux valgus deformity, possible age indeterminate erosive ahnges of teh medial border of the first metatarsal head may be consistent with gout.      Memory deficit     MMSE    Unspecified constipation     Stable on Senokot S II nightly.       Unspecified hypothyroidism     Takes Levothyroxine 52mcg 08/18/12,  TSH 1.748 08/17/13         Review of Systems: Review of Systems  Constitutional: Negative for fever, chills, weight loss, malaise/fatigue and diaphoresis.  HENT: Positive for hearing loss. Negative for congestion, ear pain and sore throat.   Eyes: Negative for blurred vision, double vision, photophobia, pain, discharge and redness.  Respiratory: Negative for cough, sputum production and wheezing.   Cardiovascular: Negative for chest pain, orthopnea, claudication, leg swelling and PND.  Gastrointestinal: Negative for heartburn, nausea, vomiting, abdominal pain, diarrhea, constipation and blood in stool.       Was in upper epigastric area.   Genitourinary: Negative for dysuria, urgency, frequency, hematuria and flank pain.  Musculoskeletal: Positive for back pain and joint pain. Negative for falls, myalgias and neck pain.       Posterior lateral lower left rib cage pain-controlled. Left foot/toe pain-not today.  Skin: Negative for itching and rash.       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  Endo/Heme/Allergies: Negative for environmental allergies and polydipsia. Does not bruise/bleed easily.  Psychiatric/Behavioral: Positive for memory loss. Negative for depression and hallucinations. The patient has insomnia. The patient is not nervous/anxious.   \  Past Medical History  Diagnosis Date  . Arthritis   .  Osteoarthritis   . Hypertension   . Cataracts, bilateral   . Closed fracture of unspecified part of lower end of humerus   . Retinal neovascularization NOS   . Macular degeneration (senile) of retina, unspecified   . Insomnia, unspecified   . Unspecified hypothyroidism   . Unspecified hearing loss   . Osteoarthrosis, unspecified whether generalized or localized, unspecified site   . Senile osteoporosis   . Urinary tract infection 08/10/2013  . Vertebral fracture 08/09/2013    T7 10%, T12 70%   . Subarachnoid hemorrhage following injury 03/31/2012  . Pancreatitis, acute 08/14/2013   Past Surgical History  Procedure Laterality Date  . Ankle fracture surgery    . Abdominal hysterectomy    . Closed reduction with humeral pin insertion  04/01/2012    Procedure: CLOSED REDUCTION WITH HUMERAL PIN INSERTION;  Surgeon: Schuyler Amor, MD;  Location: [facility];  Service: Orthopedics;  Laterality: Left;   Social History:   reports that she has never smoked. She does not have any smokeless tobacco history on file. She reports that she does not drink alcohol or use illicit drugs.  No family history on file.  Medications:   Medication List       This list is accurate as of: 01/07/14  2:44 PM.  Always use your most recent med list.               acetaminophen 500 MG tablet  Commonly known as:  TYLENOL  Take 500 mg by mouth every 6 (six) hours as needed. Pain     amLODipine 10 MG tablet  Commonly known as:  NORVASC  Take 10 mg by mouth daily.     aspirin EC 81 MG tablet  Take 81 mg by mouth daily.     CERTAVITE/ANTIOXIDANTS PO  Take 1 tablet by mouth every morning.     PRESERVISION AREDS 2 PO  Take 1 capsule by mouth 2 (two) times daily.     fentaNYL 50 MCG/HR  Commonly known as:  DURAGESIC - dosed mcg/hr  Place 1 patch (50 mcg total) onto the skin every 3 (three) days.     levothyroxine 50 MCG tablet  Commonly known as:  SYNTHROID, LEVOTHROID  Take 50 mcg by mouth daily  before breakfast.     losartan 100 MG tablet  Commonly known as:  COZAAR  Take 100 mg by mouth daily.     metoprolol tartrate 25 MG tablet  Commonly known as:  LOPRESSOR  Take 75 mg by mouth 2 (two) times daily.     omeprazole 20 MG capsule  Commonly known as:  PRILOSEC  Take 20 mg by mouth daily.     raloxifene 60 MG tablet  Commonly known as:  EVISTA  Take 60 mg by mouth daily.     senna-docusate 8.6-50 MG per tablet  Commonly known as:  Senokot-S  Take 2 tablets by mouth at bedtime.     Vitamin D-3 1000 UNITS Caps  Take 1,000  Units by mouth every morning.     zolpidem 5 MG tablet  Commonly known as:  AMBIEN  Take 2.5 mg by mouth at bedtime as needed for sleep.          Physical Exam: Physical Exam  Constitutional: She is oriented to person, place, and time. She appears well-developed and well-nourished.  HENT:  Head: Normocephalic and atraumatic.  Eyes: Conjunctivae and EOM are normal. Pupils are equal, round, and reactive to light.  Neck: Normal range of motion. Neck supple.  Cardiovascular:  No murmur heard. Pulmonary/Chest: Effort normal and breath sounds normal.  Abdominal: Soft. Bowel sounds are normal.  Musculoskeletal: Normal range of motion. She exhibits tenderness. She exhibits no edema.  Posterior lateral left lower rib cage pain-controlled. No longer c/o left foot/toe pain.   Neurological: She is alert and oriented to person, place, and time. She displays normal reflexes. No cranial nerve deficit. She exhibits normal muscle tone. Coordination normal.  Skin: Skin is warm and dry. No rash noted. No erythema.  Psychiatric: Her mood appears not anxious. Her affect is not angry, not labile and not inappropriate. Her speech is not rapid and/or pressured, not delayed and not slurred. She is not agitated, not aggressive, not slowed and not withdrawn. Thought content is not paranoid and not delusional. Cognition and memory are impaired. She does not express  impulsivity or inappropriate judgment. Depressed: flat affect. She exhibits abnormal recent memory.    Filed Vitals:   01/07/14 1441  BP: 146/82  Pulse: 72  Temp: 97.2 F (36.2 C)  TempSrc: Tympanic  Resp: 18      Labs reviewed: Basic Metabolic Panel:  Recent Labs  03/29/13 08/14/13 1605 08/15/13 0509 08/17/13 08/23/13  NA 138 133* 133*  --  137  K 3.9 3.9 3.6*  --  3.9  CL  --  94* 97  --   --   CO2  --  24 23  --   --   GLUCOSE  --  149* 136*  --   --   BUN 21 15 12   --  14  CREATININE 0.6 0.58 0.52  --  0.5  CALCIUM  --  9.8 8.7  --   --   TSH 4.73  --   --  1.75  --    Liver Function Tests:  Recent Labs  03/29/13 08/14/13 1605 08/15/13 0509  AST 13 22 19   ALT 14 22 19   ALKPHOS 39 61 52  BILITOT  --  0.9 1.0  PROT  --  7.6 6.2  ALBUMIN  --  3.7 3.2*    CBC:  Recent Labs  08/14/13 1605 08/15/13 0509 08/19/13  WBC 7.7 6.6 4.1  NEUTROABS 3.6  --   --   HGB 15.3* 13.3 13.3  HCT 43.9 38.1 37  MCV 95.6 94.5  --   PLT 201 192 216    Past Procedures:  08/14/2013 CLINICAL DATA: Epigastric pain EXAM: CHEST 2 VIEW . IMPRESSION: No active cardiopulmonary disease.   US Abdomen Complete  08/15/2013 CLINICAL DATA: Abdominal pain. Elevated lipase. EXAM: ULTRASOUND ABDOMEN  IMPRESSION: Hepatic steatosis. No acute abnormalities. Specifically, no gallstones or biliary ductal dilatation.  Ct Abdomen Pelvis W Contrast  08/14/2013 CLINICAL DATA: Abdominal pain EXAM: CT ABDOMEN AND PELVIS WITH CONTRAST IMPRESSION: 1. Atelectasis versus scarring within the lung bases. 2. Stable chronic findings within the abdomen without evidence of focal or acute abnormalities. No CT findings accounting for the patient's clinical presentation. 3. Mild hepatic steatosis  08/17/13 X-ray L ribs: no acute fracture or lytic lession involving the well visualized portions of the left ribs.   12/25/13 X-ray L foot and toes: no acute osseous abnormality, degenerative changes throughout the foot,  moderate first metatarsophalangeal joint degenerative changes and hallux valgus deformity, possible age indeterminate erosive ahnges of teh medial border of the first metatarsal head may be consistent with gout.   Assessment/Plan Left foot pain 12/25/13 X-ray L foot and toes: no acute osseous abnormality, degenerative changes throughout the foot, moderate first metatarsophalangeal joint degenerative changes and hallux valgus deformity, possible age indeterminate erosive ahnges of teh medial border of the first metatarsal head may be consistent with gout.    Unspecified hypothyroidism Takes Levothyroxine 45mcg 08/18/12,  TSH 1.748 08/17/13    Unspecified constipation Stable on Senokot S II nightly.     Insomnia, unspecified she stated she has been taking Ambien 5mg  for years. She sleeps well except occasional sleeplessness that she takes Tylenol to help with pain. She declined discontinuation of Ambien and desires Tylenol as needed. Will continue to monitor for AR of Ambien. 12/24/13 GDR decreased Trazodone from 50mg  to 25mg  per consultant pharmacist recommendation.      Hypertension Controlled on Amlodipine 10mg  and Losartan 100mg  and Metoprolol 75mg  bid.     GERD (gastroesophageal reflux disease) Stable, takes Omeprazole 20mg  daily.    Chest wall pain  Failed Norco. Controlled on Fentanyl patch 39mcg since 08/20/13 and Tylenol    Memory deficit MMSE    Family/ Staff Communication: observe the patient.   Goals of Care: AL  Labs/tests ordered: none

## 2014-01-07 NOTE — Assessment & Plan Note (Signed)
Takes Levothyroxine 69mcg 08/18/12,  TSH 1.748 08/17/13

## 2014-01-07 NOTE — Assessment & Plan Note (Signed)
Stable, takes Omeprazole 20mg daily.  

## 2014-01-07 NOTE — Assessment & Plan Note (Signed)
Controlled on Amlodipine 10mg and Losartan 100mg and Metoprolol 75mg bid.    

## 2014-01-18 ENCOUNTER — Other Ambulatory Visit: Payer: Self-pay | Admitting: *Deleted

## 2014-01-18 DIAGNOSIS — H3530 Unspecified macular degeneration: Secondary | ICD-10-CM | POA: Diagnosis not present

## 2014-01-18 DIAGNOSIS — H353290 Exudative age-related macular degeneration, unspecified eye, stage unspecified: Secondary | ICD-10-CM | POA: Diagnosis not present

## 2014-01-18 DIAGNOSIS — H35059 Retinal neovascularization, unspecified, unspecified eye: Secondary | ICD-10-CM | POA: Diagnosis not present

## 2014-01-18 MED ORDER — FENTANYL 50 MCG/HR TD PT72: 50.0000 ug | MEDICATED_PATCH | TRANSDERMAL | Status: DC

## 2014-01-18 NOTE — Telephone Encounter (Signed)
[facility] Order

## 2014-02-22 ENCOUNTER — Non-Acute Institutional Stay: Payer: Medicare Other | Admitting: Internal Medicine

## 2014-02-22 ENCOUNTER — Encounter: Payer: Self-pay | Admitting: Internal Medicine

## 2014-02-22 VITALS — BP 110/70 | HR 68 | Temp 98.0°F | Ht 59.5 in | Wt 152.0 lb

## 2014-02-22 DIAGNOSIS — R413 Other amnesia: Secondary | ICD-10-CM

## 2014-02-22 DIAGNOSIS — I10 Essential (primary) hypertension: Secondary | ICD-10-CM | POA: Diagnosis not present

## 2014-02-22 DIAGNOSIS — E039 Hypothyroidism, unspecified: Secondary | ICD-10-CM

## 2014-02-22 DIAGNOSIS — M8448XD Pathological fracture, other site, subsequent encounter for fracture with routine healing: Secondary | ICD-10-CM

## 2014-02-22 DIAGNOSIS — Z299 Encounter for prophylactic measures, unspecified: Secondary | ICD-10-CM

## 2014-02-22 DIAGNOSIS — R739 Hyperglycemia, unspecified: Secondary | ICD-10-CM

## 2014-02-22 DIAGNOSIS — Z418 Encounter for other procedures for purposes other than remedying health state: Secondary | ICD-10-CM

## 2014-02-22 DIAGNOSIS — M7918 Myalgia, other site: Secondary | ICD-10-CM

## 2014-02-22 DIAGNOSIS — M791 Myalgia: Secondary | ICD-10-CM

## 2014-02-22 DIAGNOSIS — M810 Age-related osteoporosis without current pathological fracture: Secondary | ICD-10-CM

## 2014-02-22 NOTE — Progress Notes (Addendum)
Patient ID: Melissa Carroll, female   DOB: Dec 05, 1916, 80 y.o.   MRN: [id number]    HISTORY AND PHYSICAL  Location:  [place] Room Number: AL 26 Place of Service: Clinic (12)   Extended Emergency Contact Information Primary Emergency Contact: [place] of [place] Phone: [phone] Mobile Phone: [phone] Relation: Daughter Secondary Emergency Contact: Jennet Maduro [place] of [place] Phone: [phone] Relation: Grandson   Chief Complaint  Patient presents with  . Medical Management of Chronic Issues    Comprenhensive exam: blood pressure, thyroid, insomnia    HPI:  Essential hypertension: controlled  Memory deficit: slowly progressing  Musculoskeletal pain: denies any pain  Hypothyroidism, unspecified hypothyroidism type: no rent lab. Continues on supplement  Fracture, vertebra, pathologic, with routine healing, subsequent encounter: no pain  Senile osteoporosis: using Evista  Hyperglycemia: no recent lab  Preventive measure - Plan: DNR (Do Not Resuscitate)    Past Medical History  Diagnosis Date  . Arthritis   . Osteoarthritis   . Hypertension   . Cataracts, bilateral   . Closed fracture of unspecified part of lower end of humerus   . Retinal neovascularization NOS   . Macular degeneration (senile) of retina, unspecified   . Insomnia, unspecified   . Unspecified hypothyroidism   . Unspecified hearing loss   . Osteoarthrosis, unspecified whether generalized or localized, unspecified site   . Senile osteoporosis   . Urinary tract infection 08/10/2013  . Vertebral fracture 08/09/2013    T7 10%, T12 70%   . Subarachnoid hemorrhage following injury 03/31/2012  . Pancreatitis, acute 08/14/2013    Past Surgical History  Procedure Laterality Date  . Ankle fracture surgery  1960  . Abdominal hysterectomy  1942  . Closed reduction with humeral pin insertion  04/01/2012    Procedure: CLOSED  REDUCTION WITH HUMERAL PIN INSERTION;  Surgeon: Schuyler Amor, MD;  Location: [facility];  Service: Orthopedics;  Laterality: Left;  . Bladder suspension      Patient Care Team: Estill Dooms, MD as PCP - General (Internal Medicine) Man Mast X, NP as Nurse Practitioner (Nurse Practitioner) Charlotte Crumb, MD as Consulting Physician (Orthopedic Surgery) Wallene Huh, DPM as Consulting Physician (Podiatry) Gearlean Alf, MD as Consulting Physician (Orthopedic Surgery) Zebedee Iba, MD as Referring Physician (Ophthalmology) Newman Pies, MD as Consulting Physician (Neurosurgery)  History   Social History  . Marital Status: Widowed    Spouse Name: N/A    Number of Children: N/A  . Years of Education: N/A   Occupational History  . homemaker    Social History Main Topics  . Smoking status: Never Smoker   . Smokeless tobacco: Never Used  . Alcohol Use: No  . Drug Use: No  . Sexual Activity: No   Other Topics Concern  . Not on file   Social History Narrative   Lives at [facility] since 2013   Widowed   Never smoked   Alcohol none   Exercise none   Walks with walker                       reports that she has never smoked. She has never used smokeless tobacco. She reports that she does not drink alcohol or use illicit drugs.  Immunization History  Administered Date(s) Administered  . Influenza-Unspecified 03/02/2013, 02/15/2014  . PPD Test 03/23/2012    No Known Allergies  Medications: Patient's Medications  New Prescriptions  No medications on file  Previous Medications   ACETAMINOPHEN (TYLENOL) 500 MG TABLET    Take 500 mg by mouth every 6 (six) hours as needed. Pain   AMLODIPINE (NORVASC) 10 MG TABLET    Take 10 mg by mouth daily.   ASPIRIN EC 81 MG TABLET    Take 81 mg by mouth daily.   CHOLECALCIFEROL (VITAMIN D-3) 1000 UNITS CAPS    Take 1,000 Units by mouth every morning.   FENTANYL (DURAGESIC - DOSED MCG/HR) 50 MCG/HR    Place 1 patch (50  mcg total) onto the skin every 3 (three) days.   LEVOTHYROXINE (SYNTHROID, LEVOTHROID) 50 MCG TABLET    Take 50 mcg by mouth daily before breakfast.   LOSARTAN (COZAAR) 100 MG TABLET    Take 100 mg by mouth daily.   METOPROLOL TARTRATE (LOPRESSOR) 25 MG TABLET    Take 75 mg by mouth 2 (two) times daily.   MULTIPLE VITAMINS-MINERALS (CERTAVITE/ANTIOXIDANTS PO)    Take 1 tablet by mouth every morning.   MULTIPLE VITAMINS-MINERALS (PRESERVISION AREDS 2 PO)    Take 1 capsule by mouth 2 (two) times daily.   OMEPRAZOLE (PRILOSEC) 20 MG CAPSULE    Take 20 mg by mouth daily.   RALOXIFENE (EVISTA) 60 MG TABLET    Take 60 mg by mouth daily.   SENNA-DOCUSATE (SENOKOT-S) 8.6-50 MG PER TABLET    Take 2 tablets by mouth at bedtime.   TRAZODONE (DESYREL) 50 MG TABLET    Take 50 mg by mouth. Take 1/2 tablet at bedtime for sleep  Modified Medications   No medications on file  Discontinued Medications   ZOLPIDEM (AMBIEN) 5 MG TABLET    Take 2.5 mg by mouth at bedtime as needed for sleep.     Review of Systems  Constitutional: Negative for fever, chills, diaphoresis, activity change, appetite change, fatigue and unexpected weight change.  HENT: Positive for hearing loss. Negative for congestion, ear discharge, postnasal drip, rhinorrhea, trouble swallowing and voice change.   Eyes: Negative for pain, discharge, itching and visual disturbance.  Respiratory: Negative for cough, choking, chest tightness, shortness of breath and wheezing.   Cardiovascular: Negative for chest pain, palpitations and leg swelling.  Gastrointestinal: Negative for nausea, vomiting, abdominal pain, diarrhea and constipation.  Endocrine: Negative for cold intolerance, heat intolerance, polydipsia, polyphagia and polyuria.       Has been treated for hypothyroidism  Genitourinary: Negative for dysuria, urgency, frequency and flank pain.  Musculoskeletal: Positive for arthralgias (left arm), back pain and gait problem. Negative for neck  pain and neck stiffness.  Skin: Negative for pallor, rash and wound.  Allergic/Immunologic: Negative.   Neurological: Negative for tremors, syncope, speech difficulty, weakness, numbness and headaches.  Hematological: Negative.   Psychiatric/Behavioral: Positive for confusion (occasionally) and sleep disturbance. Negative for hallucinations, behavioral problems, dysphoric mood and agitation. The patient is not nervous/anxious.     Filed Vitals:   02/22/14 1009  BP: 110/70  Pulse: 68  Temp: 98 F (36.7 C)  TempSrc: Oral  Height: 4' 11.5" (1.511 m)  Weight: 152 lb (68.947 kg)  SpO2: 95%   Body mass index is 30.2 kg/(m^2).  Physical Exam  Constitutional: She is oriented to person, place, and time. She appears well-developed and well-nourished.  HENT:  Head: Normocephalic and atraumatic.  Severe hearing loss  Eyes: Conjunctivae and EOM are normal. Pupils are equal, round, and reactive to light.  Neck: Normal range of motion. Neck supple. No JVD present. No tracheal deviation present.  No thyromegaly present.  Cardiovascular: Normal rate, regular rhythm, normal heart sounds and intact distal pulses.  Exam reveals no gallop and no friction rub.   No murmur heard. Pulmonary/Chest: Effort normal and breath sounds normal. No respiratory distress. She has no wheezes. She has no rales.  Abdominal: Soft. She exhibits no distension and no mass. There is no tenderness.  Musculoskeletal: Normal range of motion. She exhibits no edema and no tenderness.  Lymphadenopathy:    She has no cervical adenopathy.  Neurological: She is alert and oriented to person, place, and time. She displays normal reflexes. No cranial nerve deficit. She exhibits normal muscle tone. Coordination normal.  Skin: Skin is warm and dry. No rash noted. No erythema. No pallor.  Psychiatric: She has a normal mood and affect. Her behavior is normal. Her mood appears not anxious. Her affect is not angry, not labile and not  inappropriate. Her speech is not rapid and/or pressured, not delayed and not slurred. She is not agitated, not aggressive, not slowed and not withdrawn. Thought content is not paranoid and not delusional. Cognition and memory are impaired. She does not express impulsivity or inappropriate judgment. Depressed: flat affect. She exhibits abnormal recent memory.     Labs reviewed: No visits with results within 3 Month(s) from this visit. Latest known visit with results is:  Nursing Home on 09/14/2013  Component Date Value Ref Range Status  . Glucose 08/23/2013 108   Final  . BUN 08/23/2013 14  4 - 21 mg/dL Final  . Creatinine 08/23/2013 0.5  0.5 - 1.1 mg/dL Final  . Potassium 08/23/2013 3.9  3.4 - 5.3 mmol/L Final  . Sodium 08/23/2013 137  137 - 147 mmol/L Final  . Hemoglobin 08/19/2013 13.3  12.0 - 16.0 g/dL Final  . HCT 08/19/2013 37  36 - 46 % Final  . Platelets 08/19/2013 216  150 - 399 K/L Final  . WBC 08/19/2013 4.1   Final  . TSH 08/17/2013 1.75  0.41 - 5.90 uIU/mL Final    Assessment/Plan  1. Essential hypertension controlled  2. Memory deficit Getting worse  3. Musculoskeletal pain improved  4. Hypothyroidism, unspecified hypothyroidism type -TSH  5. Fracture, vertebra, pathologic, with routine healing, subsequent encounter Resolved -stop fentanyl patch  6. Senile osteoporosis Continue Evista  7. Hyperglycemia -CMP  8. Preventive measure - DNR (Do Not Resuscitate)

## 2014-02-24 DIAGNOSIS — E039 Hypothyroidism, unspecified: Secondary | ICD-10-CM | POA: Diagnosis not present

## 2014-02-24 DIAGNOSIS — I10 Essential (primary) hypertension: Secondary | ICD-10-CM | POA: Diagnosis not present

## 2014-02-25 ENCOUNTER — Other Ambulatory Visit: Payer: Self-pay | Admitting: Nurse Practitioner

## 2014-02-25 DIAGNOSIS — H35052 Retinal neovascularization, unspecified, left eye: Secondary | ICD-10-CM | POA: Diagnosis not present

## 2014-02-25 DIAGNOSIS — H3532 Exudative age-related macular degeneration: Secondary | ICD-10-CM | POA: Diagnosis not present

## 2014-02-25 DIAGNOSIS — H35352 Cystoid macular degeneration, left eye: Secondary | ICD-10-CM | POA: Diagnosis not present

## 2014-02-25 LAB — BASIC METABOLIC PANEL
BUN: 16 mg/dL (ref 4–21)
CREATININE: 0.7 mg/dL (ref 0.5–1.1)
Glucose: 96 mg/dL
Potassium: 3.9 mmol/L (ref 3.4–5.3)
SODIUM: 139 mmol/L (ref 137–147)

## 2014-02-25 LAB — TSH: TSH: 2.76 u[IU]/mL (ref 0.41–5.90)

## 2014-02-25 LAB — HEPATIC FUNCTION PANEL
ALT: 16 U/L (ref 7–35)
AST: 16 U/L (ref 13–35)
Alkaline Phosphatase: 47 U/L (ref 25–125)
Bilirubin, Total: 0.9 mg/dL

## 2014-03-16 IMAGING — CR DG ELBOW 2V*L*
3 series · 3 of 3 positions shown · non-contrast
Comparison: None

CLINICAL DATA: Fell.  Left elbow pain.

LEFT ELBOW - 2 VIEW

[x elbow ap left]
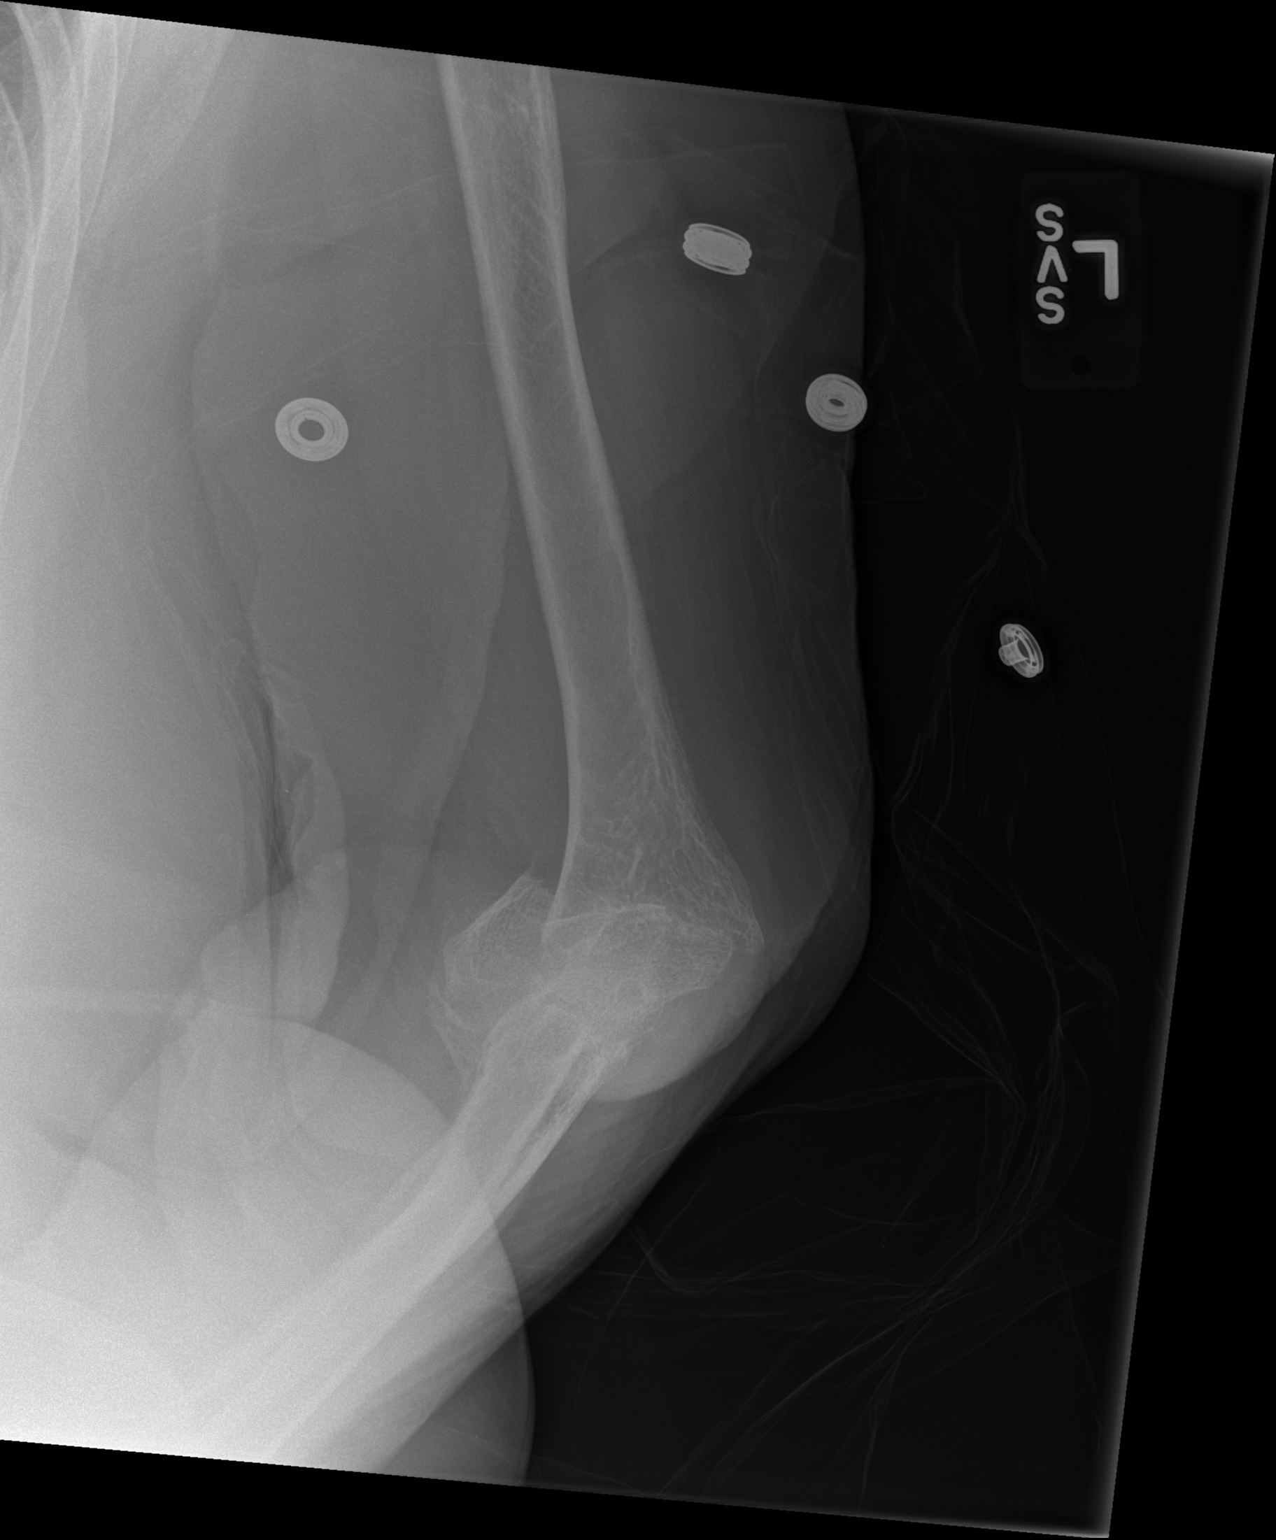

[x elbow lat left (1 of 2)]
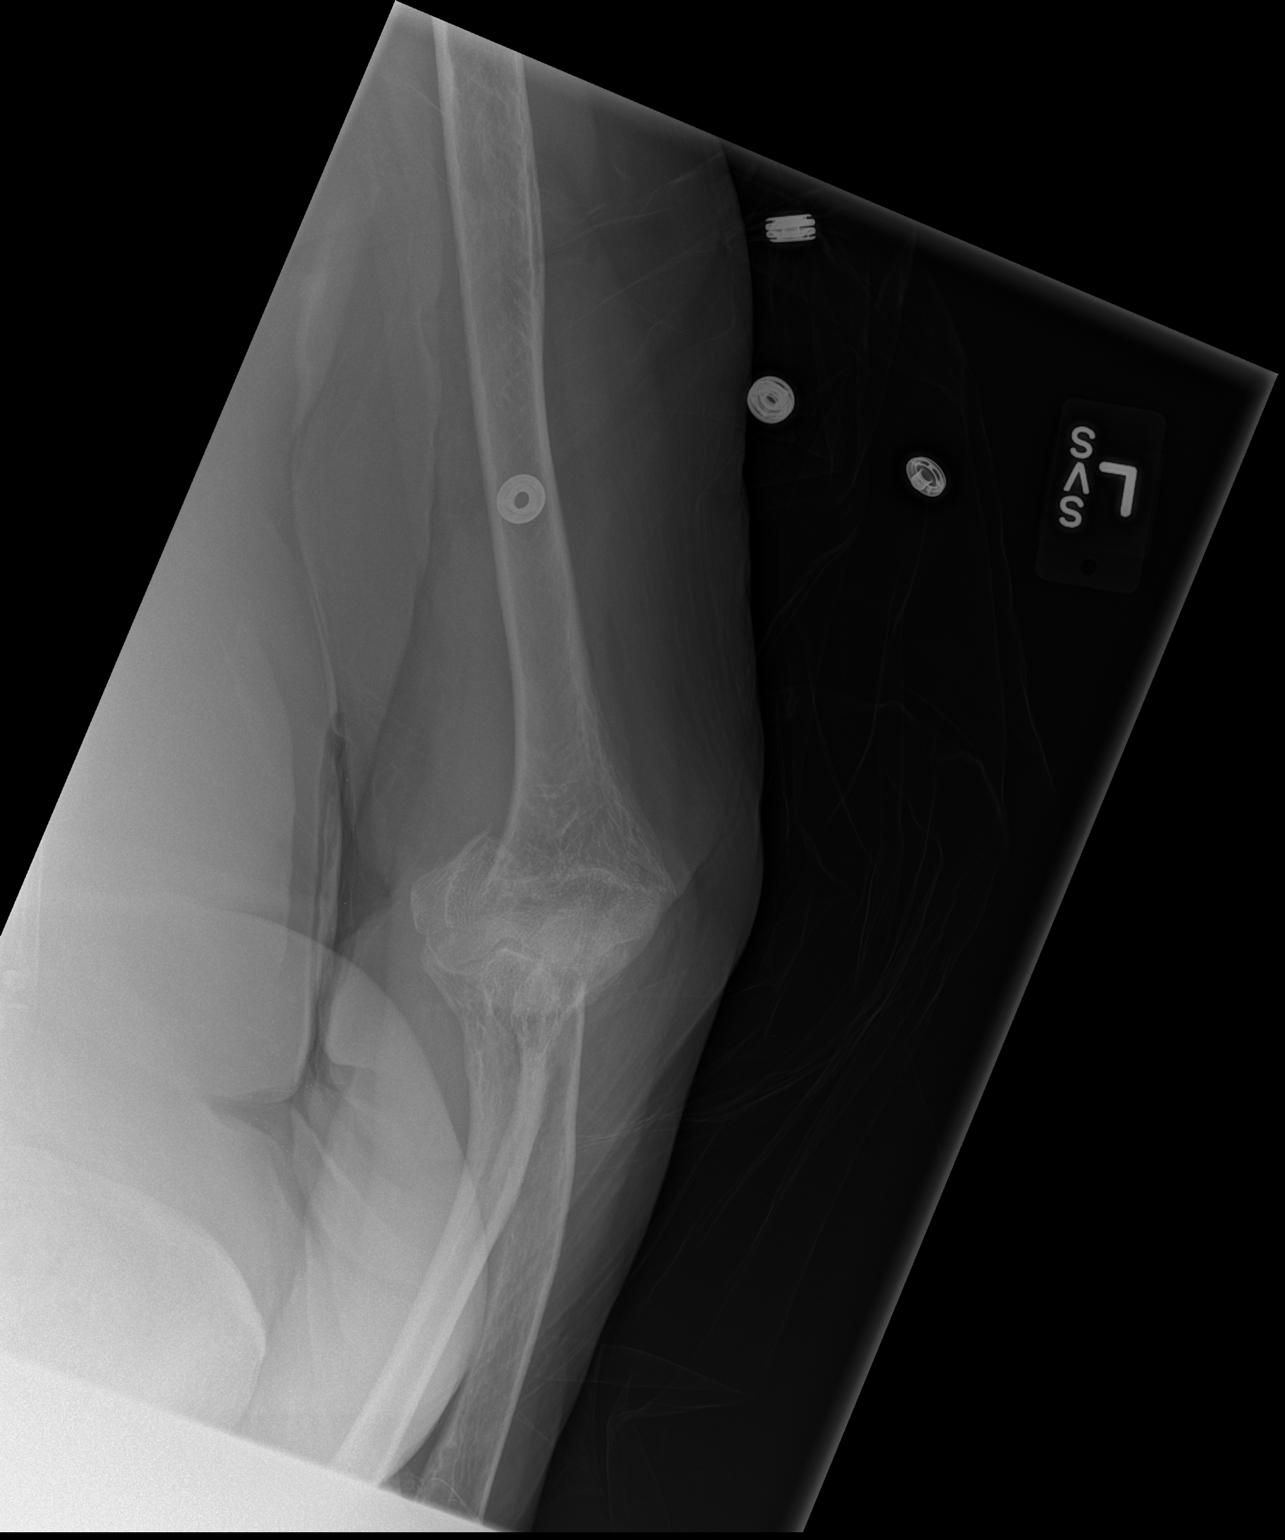

[x elbow lat left (2 of 2)]
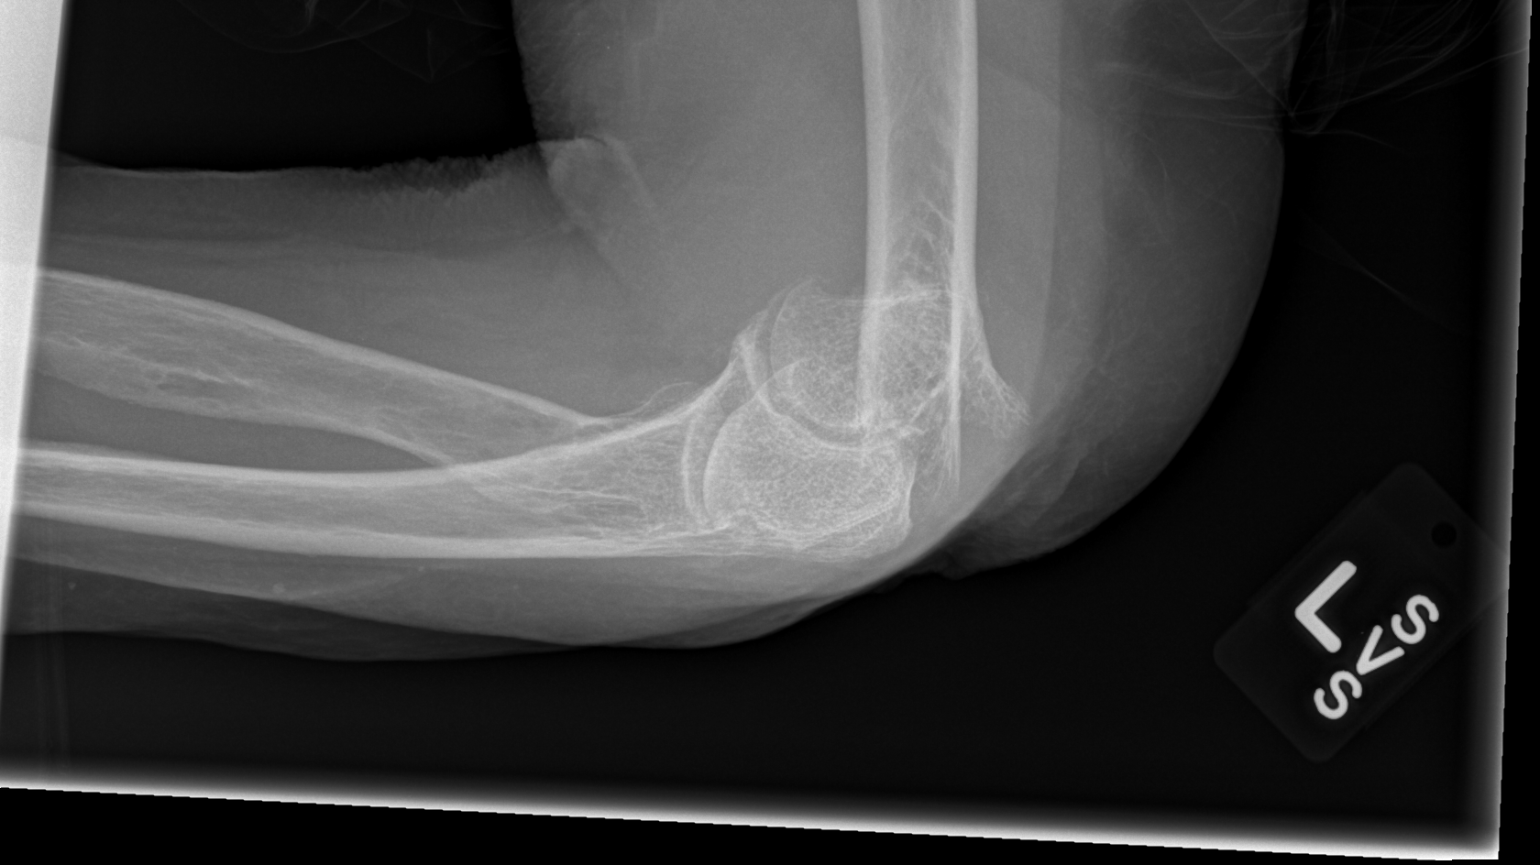

[3 of 3 positions shown; findings below may reference images not displayed]

FINDINGS: There is a comminuted supracondylar elbow fracture.  The
radius and ulna appear intact.
IMPRESSION: Complex displaced supracondylar fracture of the distal humerus.

## 2014-03-17 IMAGING — CR DG TOE GREAT 2+V*R*
1 series · 3 of 3 positions shown · non-contrast
Comparison: None.

CLINICAL DATA: Fall, right toe pain

RIGHT GREAT TOE

[Series 1: AP · right · 3 of 3 slices shown]
[im 1/3]
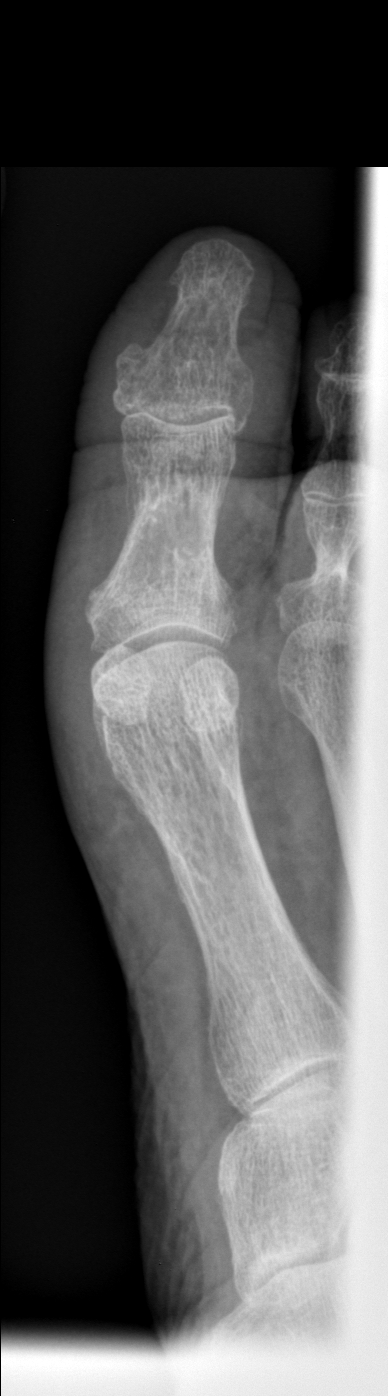
[im 2/3]
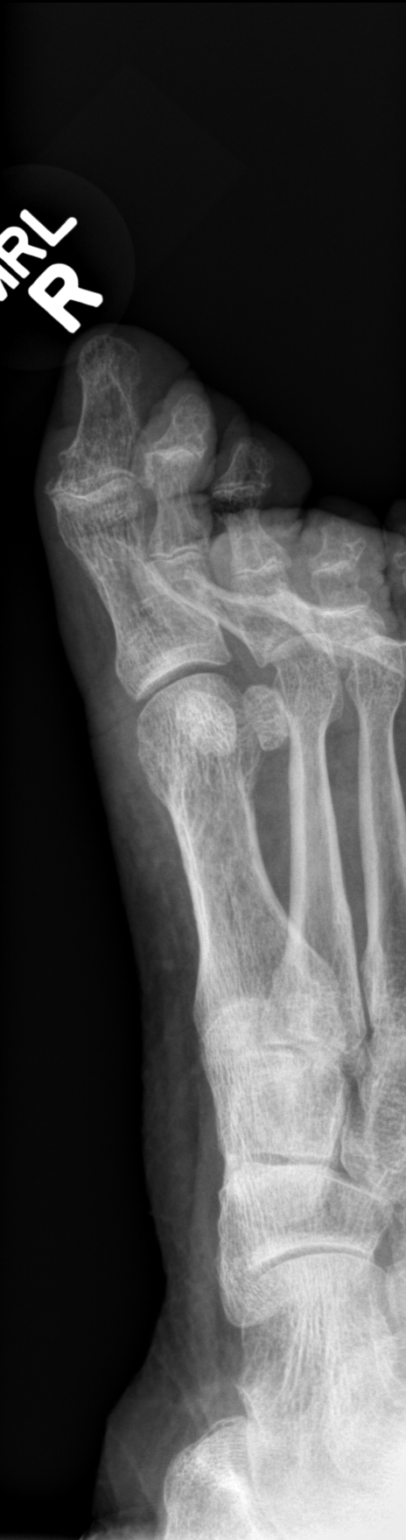
[im 3/3]
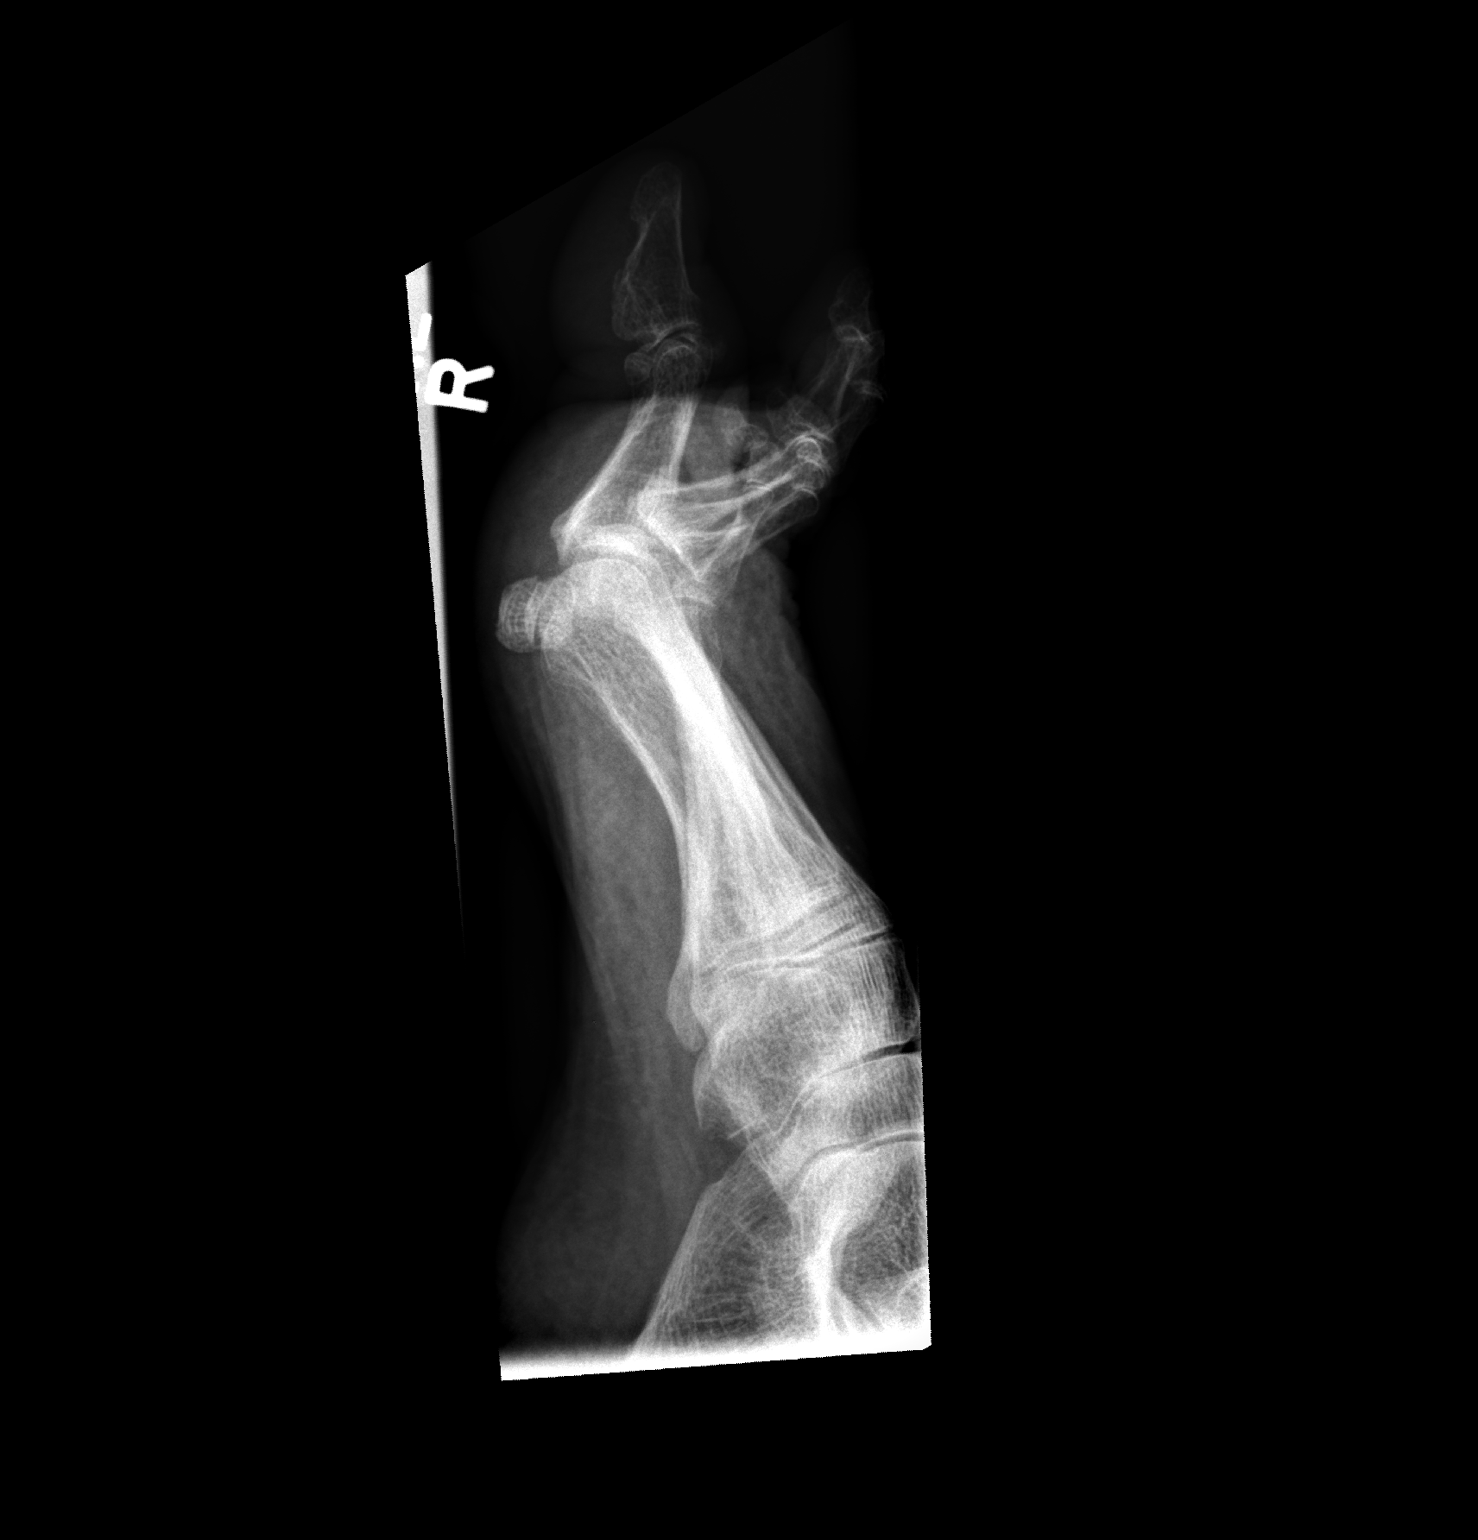

[3 of 3 positions shown; findings below may reference images not displayed]

FINDINGS: No fracture or dislocation is seen.

Mild irregularity involving the base of the first distal phalanx on
the oblique view is favored to be degenerative, given lack of
corresponding abnormality on the frontal view.

Mild degenerative changes of the MTP and IP joint.

Possible mild diffuse soft tissue swelling.
IMPRESSION: No fracture or dislocation is seen.

## 2014-04-26 DIAGNOSIS — H35052 Retinal neovascularization, unspecified, left eye: Secondary | ICD-10-CM | POA: Diagnosis not present

## 2014-04-26 DIAGNOSIS — H35352 Cystoid macular degeneration, left eye: Secondary | ICD-10-CM | POA: Diagnosis not present

## 2014-04-26 DIAGNOSIS — H3532 Exudative age-related macular degeneration: Secondary | ICD-10-CM | POA: Diagnosis not present

## 2014-04-26 DIAGNOSIS — H3530 Unspecified macular degeneration: Secondary | ICD-10-CM | POA: Diagnosis not present

## 2014-04-28 DIAGNOSIS — M109 Gout, unspecified: Secondary | ICD-10-CM | POA: Diagnosis not present

## 2014-05-24 DIAGNOSIS — H35352 Cystoid macular degeneration, left eye: Secondary | ICD-10-CM | POA: Diagnosis not present

## 2014-05-24 DIAGNOSIS — H3532 Exudative age-related macular degeneration: Secondary | ICD-10-CM | POA: Diagnosis not present

## 2014-05-24 DIAGNOSIS — H35052 Retinal neovascularization, unspecified, left eye: Secondary | ICD-10-CM | POA: Diagnosis not present

## 2014-05-24 DIAGNOSIS — H3530 Unspecified macular degeneration: Secondary | ICD-10-CM | POA: Diagnosis not present

## 2014-05-30 DIAGNOSIS — H6123 Impacted cerumen, bilateral: Secondary | ICD-10-CM | POA: Diagnosis not present

## 2014-05-30 DIAGNOSIS — Z974 Presence of external hearing-aid: Secondary | ICD-10-CM | POA: Diagnosis not present

## 2014-05-30 DIAGNOSIS — H903 Sensorineural hearing loss, bilateral: Secondary | ICD-10-CM | POA: Diagnosis not present

## 2014-06-20 DIAGNOSIS — H903 Sensorineural hearing loss, bilateral: Secondary | ICD-10-CM | POA: Diagnosis not present

## 2014-06-23 DIAGNOSIS — N390 Urinary tract infection, site not specified: Secondary | ICD-10-CM | POA: Diagnosis not present

## 2014-06-28 ENCOUNTER — Encounter: Payer: Self-pay | Admitting: Nurse Practitioner

## 2014-06-28 ENCOUNTER — Non-Acute Institutional Stay: Payer: Medicare Other | Admitting: Nurse Practitioner

## 2014-06-28 DIAGNOSIS — E039 Hypothyroidism, unspecified: Secondary | ICD-10-CM

## 2014-06-28 DIAGNOSIS — M8448XS Pathological fracture, other site, sequela: Secondary | ICD-10-CM

## 2014-06-28 DIAGNOSIS — K219 Gastro-esophageal reflux disease without esophagitis: Secondary | ICD-10-CM

## 2014-06-28 DIAGNOSIS — N390 Urinary tract infection, site not specified: Secondary | ICD-10-CM | POA: Diagnosis not present

## 2014-06-28 DIAGNOSIS — I10 Essential (primary) hypertension: Secondary | ICD-10-CM | POA: Diagnosis not present

## 2014-06-28 DIAGNOSIS — M79672 Pain in left foot: Secondary | ICD-10-CM | POA: Diagnosis not present

## 2014-06-28 DIAGNOSIS — K5900 Constipation, unspecified: Secondary | ICD-10-CM

## 2014-06-28 DIAGNOSIS — G4700 Insomnia, unspecified: Secondary | ICD-10-CM

## 2014-06-28 DIAGNOSIS — R413 Other amnesia: Secondary | ICD-10-CM | POA: Diagnosis not present

## 2014-06-28 NOTE — Assessment & Plan Note (Signed)
Controlled on Amlodipine 10mg  and Losartan 100mg  and Metoprolol 75mg  bid.

## 2014-06-28 NOTE — Assessment & Plan Note (Signed)
Pain: manage pain with Norco 5/325 bid and Fentanyl 40mcg/hr.

## 2014-06-28 NOTE — Assessment & Plan Note (Signed)
Takes Levothyroxine 11mcg 08/18/12,  TSH 2.76 02/25/14

## 2014-06-28 NOTE — Assessment & Plan Note (Signed)
Still functioning in AL

## 2014-06-28 NOTE — Progress Notes (Signed)
Patient ID: Melissa Carroll, female   DOB: [birth year], 81 y.o.   MRN: [id number]   Code Status: Full Code  No Known Allergies  Chief Complaint  Patient presents with  . Medical Management of Chronic Issues  . Acute Visit    UTI    HPI: Patient is a 81 y.o. female seen in the AL at [facility]  today for evaluation of UTI and other chronic medical conditions.   09/11/13 ED: s/p fall and chest wall pain:   08/14/13 ED: Abdominal pain Pancreatitis, acute  presented ED with a chief complaint of epigastric abdominal pain-Epigastric pain, patient reports L sided chest , back pains likely musculoskeletal in origin, with recent fall; h/o vertebral fracture; DJD C spine-CT/US abd: unremarkable; abd exam unremarkable; ECG, trop negative; cont pain control; started PPI empiric for 2 weeks    Saw Dr. Nyoka Cowden 08/10/13 for [place] 08/06/13. Sustained large ecchymosis on the medial right arm. Xrays of the spine showed compression fractures of T7 and T12. She is not very tender there, and this is likely to be old fractures not related to this fall.        Problem List Items Addressed This Visit    UTI (urinary tract infection) - Primary    06/27/14 urine culture K. Pneumoniae-10 day course of Levaquin 250mg  daily started  06/27/14       Memory deficit (Chronic)    Still functioning in AL       Left foot pain    12/25/13 X-ray L foot and toes: no acute osseous abnormality, degenerative changes throughout the foot, moderate first metatarsophalangeal joint degenerative changes and hallux valgus deformity, possible age indeterminate erosive ahnges of teh medial border of the first metatarsal head may be consistent with gout.  03/11/14 Podiatrist.  04/28/14 uric acid 4.9 06/28/14 no pain         Insomnia    she stated she has been taking Ambien 5mg  for years. She sleeps well except occasional sleeplessness that she takes Tylenol to help with pain. She declined discontinuation of Ambien and desires Tylenol  as needed. Will continue to monitor for AR of Ambien. 12/24/13 GDR decreased Trazodone from 50mg  to 25mg  per consultant pharmacist recommendation.  06/28/14 stable.          Hypothyroidism (Chronic)    Takes Levothyroxine 66mcg 08/18/12,  TSH 2.76 02/25/14       Hypertension (Chronic)    Controlled on Amlodipine 10mg  and Losartan 100mg  and Metoprolol 75mg  bid.          GERD (gastroesophageal reflux disease)    Stable, takes Omeprazole 20mg  daily.         Fracture, vertebra, pathologic (Chronic)    Pain: manage pain with Norco 5/325 bid and Fentanyl 47mcg/hr.        Constipation    Stable on Senokot S II nightly.            Review of Systems: Review of Systems  Constitutional: Negative for fever, chills, weight loss, malaise/fatigue and diaphoresis.  HENT: Positive for hearing loss. Negative for congestion, ear discharge, ear pain, nosebleeds, sore throat and tinnitus.   Eyes: Negative for blurred vision, double vision, photophobia, pain, discharge and redness.  Respiratory: Negative for cough, hemoptysis, sputum production, shortness of breath, wheezing and stridor.   Cardiovascular: Positive for leg swelling. Negative for chest pain, palpitations, orthopnea, claudication and PND.       Ankles  Gastrointestinal: Negative for heartburn, nausea, vomiting, abdominal pain, diarrhea, constipation,  blood in stool and melena.  Genitourinary: Positive for frequency. Negative for dysuria, urgency, hematuria and flank pain.  Musculoskeletal: Positive for back pain and falls. Negative for myalgias, joint pain and neck pain.       Wobbly gait  Skin: Negative for itching and rash.       Peri-orbital ecchymoses. Left forearm contusion.   Neurological: Negative for dizziness, tingling, tremors, sensory change, speech change, focal weakness, seizures, loss of consciousness, weakness and headaches.  Endo/Heme/Allergies: Negative for environmental allergies and polydipsia. Does not  bruise/bleed easily.  Psychiatric/Behavioral: Positive for memory loss. Negative for depression, suicidal ideas, hallucinations and substance abuse. The patient is not nervous/anxious and does not have insomnia.   \  Past Medical History  Diagnosis Date  . Arthritis   . Osteoarthritis   . Hypertension   . Cataracts, bilateral   . Closed fracture of unspecified part of lower end of humerus   . Retinal neovascularization NOS   . Macular degeneration (senile) of retina, unspecified   . Insomnia, unspecified   . Unspecified hypothyroidism   . Unspecified hearing loss   . Osteoarthrosis, unspecified whether generalized or localized, unspecified site   . Senile osteoporosis   . Urinary tract infection 08/10/2013  . Vertebral fracture 08/09/2013    T7 10%, T12 70%   . Subarachnoid hemorrhage following injury 03/31/2012  . Pancreatitis, acute 08/14/2013   Past Surgical History  Procedure Laterality Date  . Ankle fracture surgery  1960  . Abdominal hysterectomy  1942  . Closed reduction with humeral pin insertion  04/01/2012    Procedure: CLOSED REDUCTION WITH HUMERAL PIN INSERTION;  Surgeon: Schuyler Amor, MD;  Location: [facility];  Service: Orthopedics;  Laterality: Left;  . Bladder suspension     Social History:   reports that she has never smoked. She has never used smokeless tobacco. She reports that she does not drink alcohol or use illicit drugs.  Family History  Problem Relation Age of Onset  . Hypertension Mother     Medications:   Medication List       This list is accurate as of: 06/28/14  4:55 PM.  Always use your most recent med list.               acetaminophen 500 MG tablet  Commonly known as:  TYLENOL  Take 500 mg by mouth every 6 (six) hours as needed. Pain     amLODipine 10 MG tablet  Commonly known as:  NORVASC  Take 10 mg by mouth daily.     aspirin EC 81 MG tablet  Take 81 mg by mouth daily.     CERTAVITE/ANTIOXIDANTS PO  Take 1 tablet by mouth  every morning.     PRESERVISION AREDS 2 PO  Take 1 capsule by mouth 2 (two) times daily.     fentaNYL 50 MCG/HR  Commonly known as:  DURAGESIC - dosed mcg/hr  Place 1 patch (50 mcg total) onto the skin every 3 (three) days.     levothyroxine 50 MCG tablet  Commonly known as:  SYNTHROID, LEVOTHROID  Take 50 mcg by mouth daily before breakfast.     losartan 100 MG tablet  Commonly known as:  COZAAR  Take 100 mg by mouth daily.     metoprolol tartrate 25 MG tablet  Commonly known as:  LOPRESSOR  Take 75 mg by mouth 2 (two) times daily.     omeprazole 20 MG capsule  Commonly known as:  PRILOSEC  Take  20 mg by mouth daily.     raloxifene 60 MG tablet  Commonly known as:  EVISTA  Take 60 mg by mouth daily.     senna-docusate 8.6-50 MG per tablet  Commonly known as:  Senokot-S  Take 2 tablets by mouth at bedtime.     traZODone 50 MG tablet  Commonly known as:  DESYREL  Take 50 mg by mouth. Take 1/2 tablet at bedtime for sleep     Vitamin D-3 1000 UNITS Caps  Take 1,000 Units by mouth every morning.          Physical Exam: Physical Exam  Constitutional: She is oriented to person, place, and time. She appears well-developed and well-nourished.  HENT:  Head: Normocephalic and atraumatic.  Eyes: Conjunctivae and EOM are normal. Pupils are equal, round, and reactive to light.  Neck: Normal range of motion. Neck supple.  Cardiovascular:  No murmur heard. Pulmonary/Chest: Effort normal and breath sounds normal. No respiratory distress. She has no wheezes. She has no rales. She exhibits no tenderness.  Abdominal: Soft. Bowel sounds are normal. She exhibits no distension and no mass. There is no tenderness. There is no rebound and no guarding.  Musculoskeletal: Normal range of motion. She exhibits tenderness. She exhibits no edema.  Posterior lateral left lower rib cage pain-controlled. No longer c/o left foot/toe pain.  Unsteady gait.   Neurological: She is alert and  oriented to person, place, and time. She displays normal reflexes. No cranial nerve deficit. She exhibits normal muscle tone. Coordination normal.  Skin: Skin is warm and dry. No rash noted. No erythema.  Peri-orbital ecchymoses. Left forearm contusion.   Psychiatric: Her mood appears not anxious. Her affect is not angry, not labile and not inappropriate. Her speech is not rapid and/or pressured, not delayed and not slurred. She is not agitated, not aggressive, not slowed and not withdrawn. Thought content is not paranoid and not delusional. Cognition and memory are impaired. She does not express impulsivity or inappropriate judgment. Depressed: flat affect. She exhibits abnormal recent memory.    Filed Vitals:   06/28/14 1651  BP: 148/74  Pulse: 88  Temp: 97.4 F (36.3 C)  TempSrc: Tympanic  Resp: 18      Labs reviewed: Basic Metabolic Panel:  Recent Labs  08/14/13 1605 08/15/13 0509 08/17/13 08/23/13 02/25/14  NA 133* 133*  --  137 139  K 3.9 3.6*  --  3.9 3.9  CL 94* 97  --   --   --   CO2 24 23  --   --   --   GLUCOSE 149* 136*  --   --   --   BUN 15 12  --  14 16  CREATININE 0.58 0.52  --  0.5 0.7  CALCIUM 9.8 8.7  --   --   --   TSH  --   --  1.75  --  2.76   Liver Function Tests:  Recent Labs  08/14/13 1605 08/15/13 0509 02/25/14  AST 22 19 16   ALT 22 19 16   ALKPHOS 61 52 47  BILITOT 0.9 1.0  --   PROT 7.6 6.2  --   ALBUMIN 3.7 3.2*  --     CBC:  Recent Labs  08/14/13 1605 08/15/13 0509 08/19/13  WBC 7.7 6.6 4.1  NEUTROABS 3.6  --   --   HGB 15.3* 13.3 13.3  HCT 43.9 38.1 37  MCV 95.6 94.5  --   PLT 201 192 216  Past Procedures:  08/14/2013 CLINICAL DATA: Epigastric pain EXAM: CHEST 2 VIEW . IMPRESSION: No active cardiopulmonary disease.   US Abdomen Complete  08/15/2013 CLINICAL DATA: Abdominal pain. Elevated lipase. EXAM: ULTRASOUND ABDOMEN  IMPRESSION: Hepatic steatosis. No acute abnormalities. Specifically, no gallstones or biliary ductal  dilatation.  Ct Abdomen Pelvis W Contrast  08/14/2013 CLINICAL DATA: Abdominal pain EXAM: CT ABDOMEN AND PELVIS WITH CONTRAST IMPRESSION: 1. Atelectasis versus scarring within the lung bases. 2. Stable chronic findings within the abdomen without evidence of focal or acute abnormalities. No CT findings accounting for the patient's clinical presentation. 3. Mild hepatic steatosis  08/17/13 X-ray L ribs: no acute fracture or lytic lession involving the well visualized portions of the left ribs.   12/25/13 X-ray L foot and toes: no acute osseous abnormality, degenerative changes throughout the foot, moderate first metatarsophalangeal joint degenerative changes and hallux valgus deformity, possible age indeterminate erosive ahnges of teh medial border of the first metatarsal head may be consistent with gout.   Assessment/Plan UTI (urinary tract infection) 06/27/14 urine culture K. Pneumoniae-10 day course of Levaquin 250mg  daily started  06/27/14    Constipation Stable on Senokot S II nightly.      Fracture, vertebra, pathologic Pain: manage pain with Norco 5/325 bid and Fentanyl 44mcg/hr.     GERD (gastroesophageal reflux disease) Stable, takes Omeprazole 20mg  daily.      Hypertension Controlled on Amlodipine 10mg  and Losartan 100mg  and Metoprolol 75mg  bid.       Hypothyroidism Takes Levothyroxine 56mcg 08/18/12,  TSH 2.76 02/25/14    Insomnia she stated she has been taking Ambien 5mg  for years. She sleeps well except occasional sleeplessness that she takes Tylenol to help with pain. She declined discontinuation of Ambien and desires Tylenol as needed. Will continue to monitor for AR of Ambien. 12/24/13 GDR decreased Trazodone from 50mg  to 25mg  per consultant pharmacist recommendation.  06/28/14 stable.       Left foot pain 12/25/13 X-ray L foot and toes: no acute osseous abnormality, degenerative changes throughout the foot, moderate first metatarsophalangeal joint degenerative  changes and hallux valgus deformity, possible age indeterminate erosive ahnges of teh medial border of the first metatarsal head may be consistent with gout.  03/11/14 Podiatrist.  04/28/14 uric acid 4.9 06/28/14 no pain      Memory deficit Still functioning in AL      Family/ Staff Communication: observe the patient.   Goals of Care: AL  Labs/tests ordered: none

## 2014-06-28 NOTE — Assessment & Plan Note (Signed)
Stable, takes Omeprazole 20mg daily.  

## 2014-06-28 NOTE — Assessment & Plan Note (Signed)
12/25/13 X-ray L foot and toes: no acute osseous abnormality, degenerative changes throughout the foot, moderate first metatarsophalangeal joint degenerative changes and hallux valgus deformity, possible age indeterminate erosive ahnges of teh medial border of the first metatarsal head may be consistent with gout.  03/11/14 Podiatrist.  04/28/14 uric acid 4.9 06/28/14 no pain

## 2014-06-28 NOTE — Assessment & Plan Note (Signed)
06/27/14 urine culture K. Pneumoniae-10 day course of Levaquin 250mg  daily started  06/27/14

## 2014-06-28 NOTE — Assessment & Plan Note (Signed)
she stated she has been taking Ambien 5mg  for years. She sleeps well except occasional sleeplessness that she takes Tylenol to help with pain. She declined discontinuation of Ambien and desires Tylenol as needed. Will continue to monitor for AR of Ambien. 12/24/13 GDR decreased Trazodone from 50mg  to 25mg  per consultant pharmacist recommendation.  06/28/14 stable.

## 2014-06-28 NOTE — Assessment & Plan Note (Signed)
Stable on Senokot S II nightly.

## 2014-07-19 ENCOUNTER — Encounter: Payer: Self-pay | Admitting: Nurse Practitioner

## 2014-07-19 ENCOUNTER — Non-Acute Institutional Stay: Payer: Medicare Other | Admitting: Nurse Practitioner

## 2014-07-19 DIAGNOSIS — R413 Other amnesia: Secondary | ICD-10-CM

## 2014-07-19 DIAGNOSIS — K219 Gastro-esophageal reflux disease without esophagitis: Secondary | ICD-10-CM

## 2014-07-19 DIAGNOSIS — M8448XS Pathological fracture, other site, sequela: Secondary | ICD-10-CM

## 2014-07-19 DIAGNOSIS — M545 Low back pain, unspecified: Secondary | ICD-10-CM | POA: Insufficient documentation

## 2014-07-19 DIAGNOSIS — E039 Hypothyroidism, unspecified: Secondary | ICD-10-CM

## 2014-07-19 DIAGNOSIS — K5900 Constipation, unspecified: Secondary | ICD-10-CM | POA: Diagnosis not present

## 2014-07-19 DIAGNOSIS — I10 Essential (primary) hypertension: Secondary | ICD-10-CM

## 2014-07-19 DIAGNOSIS — M5440 Lumbago with sciatica, unspecified side: Secondary | ICD-10-CM

## 2014-07-19 DIAGNOSIS — G4700 Insomnia, unspecified: Secondary | ICD-10-CM

## 2014-07-19 DIAGNOSIS — M25559 Pain in unspecified hip: Secondary | ICD-10-CM | POA: Diagnosis not present

## 2014-07-19 NOTE — Assessment & Plan Note (Signed)
C/o lower back across R+L SIJ fro 4-5 days, aches in nature, denied injury, dysuria, or referral pain. Still able to ambulates with walker. Will obtain X-ray of the lumbar pain and pelvis. Schedule Tylenol 650mg  bid for now. UA C/S to r/o UTI

## 2014-07-19 NOTE — Assessment & Plan Note (Signed)
Stable, takes Omeprazole 20mg daily.  

## 2014-07-19 NOTE — Assessment & Plan Note (Signed)
Still functioning in AL

## 2014-07-19 NOTE — Assessment & Plan Note (Signed)
Controlled on Amlodipine 10mg  and Losartan 100mg  and Metoprolol 75mg  bid.

## 2014-07-19 NOTE — Assessment & Plan Note (Signed)
Stable on Senokot S II nightly.

## 2014-07-19 NOTE — Assessment & Plan Note (Signed)
Pain is in her lower back across R+L SIJs

## 2014-07-19 NOTE — Assessment & Plan Note (Signed)
she stated she has been taking Ambien 5mg  for years. She sleeps well except occasional sleeplessness that she takes Tylenol to help with pain. She declined discontinuation of Ambien and desires Tylenol as needed. Will continue to monitor for AR of Ambien. 12/24/13 GDR decreased Trazodone from 50mg  to 25mg  per consultant pharmacist recommendation.  --stable.

## 2014-07-19 NOTE — Assessment & Plan Note (Signed)
Takes Levothyroxine 19mcg 08/18/12,  TSH 2.76 02/25/14

## 2014-07-19 NOTE — Progress Notes (Signed)
Patient ID: Melissa Carroll, female   DOB: August 02, 1916, 81 y.o.   MRN: [id number]   Code Status: Full Code  No Known Allergies  Chief Complaint  Patient presents with  . Medical Management of Chronic Issues  . Acute Visit    lower back pain.     HPI: Patient is a 81 y.o. female seen in the AL at [facility]  today for evaluation of lower back pain and other chronic medical conditions.   09/11/13 ED: s/p fall and chest wall pain:   08/14/13 ED: Abdominal pain Pancreatitis, acute  presented ED with a chief complaint of epigastric abdominal pain-Epigastric pain, patient reports L sided chest , back pains likely musculoskeletal in origin, with recent fall; h/o vertebral fracture; DJD C spine-CT/US abd: unremarkable; abd exam unremarkable; ECG, trop negative; cont pain control; started PPI empiric for 2 weeks    Saw Dr. Nyoka Cowden 08/10/13 for [place] 08/06/13. Sustained large ecchymosis on the medial right arm. Xrays of the spine showed compression fractures of T7 and T12. She is not very tender there, and this is likely to be old fractures not related to this fall.        Problem List Items Addressed This Visit    Memory deficit (Chronic)    Still functioning in AL        Lower back pain - Primary    C/o lower back across R+L SIJ fro 4-5 days, aches in nature, denied injury, dysuria, or referral pain. Still able to ambulates with walker. Will obtain X-ray of the lumbar pain and pelvis. Schedule Tylenol 650mg  bid for now. UA C/S to r/o UTI       Insomnia    she stated she has been taking Ambien 5mg  for years. She sleeps well except occasional sleeplessness that she takes Tylenol to help with pain. She declined discontinuation of Ambien and desires Tylenol as needed. Will continue to monitor for AR of Ambien. 12/24/13 GDR decreased Trazodone from 50mg  to 25mg  per consultant pharmacist recommendation.  --stable.         Hypothyroidism (Chronic)    Takes Levothyroxine 61mcg 08/18/12,  TSH 2.76  02/25/14       Hypertension (Chronic)    Controlled on Amlodipine 10mg  and Losartan 100mg  and Metoprolol 75mg  bid.        GERD (gastroesophageal reflux disease)    Stable, takes Omeprazole 20mg  daily.       Fracture, vertebra, pathologic (Chronic)    Pain is in her lower back across R+L SIJs      Constipation    Stable on Senokot S II nightly.           Review of Systems: Review of Systems  Constitutional: Negative for fever, chills, weight loss, malaise/fatigue and diaphoresis.  HENT: Positive for hearing loss. Negative for congestion, ear discharge, ear pain, nosebleeds, sore throat and tinnitus.   Eyes: Negative for blurred vision, double vision, photophobia, pain, discharge and redness.  Respiratory: Negative for cough, hemoptysis, sputum production, shortness of breath, wheezing and stridor.   Cardiovascular: Positive for leg swelling. Negative for chest pain, palpitations, orthopnea, claudication and PND.       Trace in ankles.   Gastrointestinal: Negative for heartburn, nausea, vomiting, abdominal pain, diarrhea, constipation, blood in stool and melena.  Genitourinary: Positive for frequency. Negative for dysuria, urgency, hematuria and flank pain.  Musculoskeletal: Positive for back pain. Negative for myalgias, joint pain, falls and neck pain.       Lower back  across R+L SIJ  Skin: Negative for itching and rash.  Neurological: Negative for dizziness, tingling, tremors, sensory change, speech change, focal weakness, seizures, loss of consciousness, weakness and headaches.  Endo/Heme/Allergies: Negative for environmental allergies and polydipsia. Does not bruise/bleed easily.  Psychiatric/Behavioral: Positive for memory loss. Negative for depression, suicidal ideas, hallucinations and substance abuse. The patient is not nervous/anxious and does not have insomnia.   \  Past Medical History  Diagnosis Date  . Arthritis   . Osteoarthritis   . Hypertension   .  Cataracts, bilateral   . Closed fracture of unspecified part of lower end of humerus   . Retinal neovascularization NOS   . Macular degeneration (senile) of retina, unspecified   . Insomnia, unspecified   . Unspecified hypothyroidism   . Unspecified hearing loss   . Osteoarthrosis, unspecified whether generalized or localized, unspecified site   . Senile osteoporosis   . Urinary tract infection 08/10/2013  . Vertebral fracture 08/09/2013    T7 10%, T12 70%   . Subarachnoid hemorrhage following injury 03/31/2012  . Pancreatitis, acute 08/14/2013   Past Surgical History  Procedure Laterality Date  . Ankle fracture surgery  1960  . Abdominal hysterectomy  1942  . Closed reduction with humeral pin insertion  04/01/2012    Procedure: CLOSED REDUCTION WITH HUMERAL PIN INSERTION;  Surgeon: Schuyler Amor, MD;  Location: [facility];  Service: Orthopedics;  Laterality: Left;  . Bladder suspension     Social History:   reports that she has never smoked. She has never used smokeless tobacco. She reports that she does not drink alcohol or use illicit drugs.  Family History  Problem Relation Age of Onset  . Hypertension Mother     Medications:   Medication List       This list is accurate as of: 07/19/14  2:41 PM.  Always use your most recent med list.               acetaminophen 500 MG tablet  Commonly known as:  TYLENOL  Take 500 mg by mouth every 6 (six) hours as needed. Pain     amLODipine 10 MG tablet  Commonly known as:  NORVASC  Take 10 mg by mouth daily.     aspirin EC 81 MG tablet  Take 81 mg by mouth daily.     CERTAVITE/ANTIOXIDANTS PO  Take 1 tablet by mouth every morning.     PRESERVISION AREDS 2 PO  Take 1 capsule by mouth 2 (two) times daily.     fentaNYL 50 MCG/HR  Commonly known as:  DURAGESIC - dosed mcg/hr  Place 1 patch (50 mcg total) onto the skin every 3 (three) days.     levothyroxine 50 MCG tablet  Commonly known as:  SYNTHROID, LEVOTHROID  Take  50 mcg by mouth daily before breakfast.     losartan 100 MG tablet  Commonly known as:  COZAAR  Take 100 mg by mouth daily.     metoprolol tartrate 25 MG tablet  Commonly known as:  LOPRESSOR  Take 75 mg by mouth 2 (two) times daily.     omeprazole 20 MG capsule  Commonly known as:  PRILOSEC  Take 20 mg by mouth daily.     raloxifene 60 MG tablet  Commonly known as:  EVISTA  Take 60 mg by mouth daily.     senna-docusate 8.6-50 MG per tablet  Commonly known as:  Senokot-S  Take 2 tablets by mouth at bedtime.  traZODone 50 MG tablet  Commonly known as:  DESYREL  Take 50 mg by mouth. Take 1/2 tablet at bedtime for sleep     Vitamin D-3 1000 UNITS Caps  Take 1,000 Units by mouth every morning.          Physical Exam: Physical Exam  Constitutional: She is oriented to person, place, and time. She appears well-developed and well-nourished.  HENT:  Head: Normocephalic and atraumatic.  Eyes: Conjunctivae and EOM are normal. Pupils are equal, round, and reactive to light.  Neck: Normal range of motion. Neck supple.  Cardiovascular:  No murmur heard. Pulmonary/Chest: Effort normal and breath sounds normal. No respiratory distress. She has no wheezes. She has no rales. She exhibits no tenderness.  Abdominal: Soft. Bowel sounds are normal. She exhibits no distension and no mass. There is no tenderness. There is no rebound and no guarding.  Musculoskeletal: Normal range of motion. She exhibits tenderness. She exhibits no edema.   Unsteady gait. Lower back across R+L SIJ   Neurological: She is alert and oriented to person, place, and time. She displays normal reflexes. No cranial nerve deficit. She exhibits normal muscle tone. Coordination normal.  Skin: Skin is warm and dry. No rash noted. No erythema.  Psychiatric: Her mood appears not anxious. Her affect is not angry, not labile and not inappropriate. Her speech is not rapid and/or pressured, not delayed and not slurred. She  is not agitated, not aggressive, not slowed and not withdrawn. Thought content is not paranoid and not delusional. Cognition and memory are impaired. She does not express impulsivity or inappropriate judgment. Depressed: flat affect. She exhibits abnormal recent memory.    Filed Vitals:   07/19/14 1348  BP: 148/82  Pulse: 86  Temp: 97.7 F (36.5 C)  TempSrc: Tympanic  Resp: 18      Labs reviewed: Basic Metabolic Panel:  Recent Labs  08/14/13 1605 08/15/13 0509 08/17/13 08/23/13 02/25/14  NA 133* 133*  --  137 139  K 3.9 3.6*  --  3.9 3.9  CL 94* 97  --   --   --   CO2 24 23  --   --   --   GLUCOSE 149* 136*  --   --   --   BUN 15 12  --  14 16  CREATININE 0.58 0.52  --  0.5 0.7  CALCIUM 9.8 8.7  --   --   --   TSH  --   --  1.75  --  2.76   Liver Function Tests:  Recent Labs  08/14/13 1605 08/15/13 0509 02/25/14  AST 22 19 16   ALT 22 19 16   ALKPHOS 61 52 47  BILITOT 0.9 1.0  --   PROT 7.6 6.2  --   ALBUMIN 3.7 3.2*  --     CBC:  Recent Labs  08/14/13 1605 08/15/13 0509 08/19/13  WBC 7.7 6.6 4.1  NEUTROABS 3.6  --   --   HGB 15.3* 13.3 13.3  HCT 43.9 38.1 37  MCV 95.6 94.5  --   PLT 201 192 216    Past Procedures:  08/14/2013 CLINICAL DATA: Epigastric pain EXAM: CHEST 2 VIEW . IMPRESSION: No active cardiopulmonary disease.   US Abdomen Complete  08/15/2013 CLINICAL DATA: Abdominal pain. Elevated lipase. EXAM: ULTRASOUND ABDOMEN  IMPRESSION: Hepatic steatosis. No acute abnormalities. Specifically, no gallstones or biliary ductal dilatation.  Ct Abdomen Pelvis W Contrast  08/14/2013 CLINICAL DATA: Abdominal pain EXAM: CT ABDOMEN AND PELVIS WITH  CONTRAST IMPRESSION: 1. Atelectasis versus scarring within the lung bases. 2. Stable chronic findings within the abdomen without evidence of focal or acute abnormalities. No CT findings accounting for the patient's clinical presentation. 3. Mild hepatic steatosis  08/17/13 X-ray L ribs: no acute fracture or lytic  lession involving the well visualized portions of the left ribs.   12/25/13 X-ray L foot and toes: no acute osseous abnormality, degenerative changes throughout the foot, moderate first metatarsophalangeal joint degenerative changes and hallux valgus deformity, possible age indeterminate erosive ahnges of teh medial border of the first metatarsal head may be consistent with gout.   Assessment/Plan Lower back pain C/o lower back across R+L SIJ fro 4-5 days, aches in nature, denied injury, dysuria, or referral pain. Still able to ambulates with walker. Will obtain X-ray of the lumbar pain and pelvis. Schedule Tylenol 650mg  bid for now. UA C/S to r/o UTI    Constipation Stable on Senokot S II nightly.     Fracture, vertebra, pathologic Pain is in her lower back across R+L SIJs   GERD (gastroesophageal reflux disease) Stable, takes Omeprazole 20mg  daily.    Hypertension Controlled on Amlodipine 10mg  and Losartan 100mg  and Metoprolol 75mg  bid.     Hypothyroidism Takes Levothyroxine 22mcg 08/18/12,  TSH 2.76 02/25/14    Insomnia she stated she has been taking Ambien 5mg  for years. She sleeps well except occasional sleeplessness that she takes Tylenol to help with pain. She declined discontinuation of Ambien and desires Tylenol as needed. Will continue to monitor for AR of Ambien. 12/24/13 GDR decreased Trazodone from 50mg  to 25mg  per consultant pharmacist recommendation.  --stable.      Memory deficit Still functioning in AL       Family/ Staff Communication: observe the patient.   Goals of Care: AL  Labs/tests ordered: UA C/S. X-ray lumbar spine and pelvis.

## 2014-07-20 DIAGNOSIS — N390 Urinary tract infection, site not specified: Secondary | ICD-10-CM | POA: Diagnosis not present

## 2014-07-26 ENCOUNTER — Non-Acute Institutional Stay: Payer: Medicare Other | Admitting: Nurse Practitioner

## 2014-07-26 ENCOUNTER — Encounter: Payer: Self-pay | Admitting: Nurse Practitioner

## 2014-07-26 DIAGNOSIS — K219 Gastro-esophageal reflux disease without esophagitis: Secondary | ICD-10-CM | POA: Diagnosis not present

## 2014-07-26 DIAGNOSIS — M5440 Lumbago with sciatica, unspecified side: Secondary | ICD-10-CM

## 2014-07-26 DIAGNOSIS — K5900 Constipation, unspecified: Secondary | ICD-10-CM | POA: Diagnosis not present

## 2014-07-26 DIAGNOSIS — I10 Essential (primary) hypertension: Secondary | ICD-10-CM | POA: Diagnosis not present

## 2014-07-26 DIAGNOSIS — E039 Hypothyroidism, unspecified: Secondary | ICD-10-CM

## 2014-07-26 DIAGNOSIS — R413 Other amnesia: Secondary | ICD-10-CM

## 2014-07-26 DIAGNOSIS — G4700 Insomnia, unspecified: Secondary | ICD-10-CM

## 2014-07-26 DIAGNOSIS — M8448XA Pathological fracture, other site, initial encounter for fracture: Secondary | ICD-10-CM

## 2014-07-26 NOTE — Assessment & Plan Note (Signed)
Stable, takes Omeprazole 20mg daily.  

## 2014-07-26 NOTE — Assessment & Plan Note (Signed)
Will manage pain with Norco 5/325mg  bid since Tylenol 650mg  bid is not effective. Negative urine 07/21/14

## 2014-07-26 NOTE — Assessment & Plan Note (Signed)
Controlled on Amlodipine 10mg  and Losartan 100mg  and Metoprolol 75mg  bid.

## 2014-07-26 NOTE — Progress Notes (Signed)
Patient ID: Melissa Carroll, female   DOB: [birth year], 81 y.o.   MRN: [id number]   Code Status: DNR  No Known Allergies  Chief Complaint  Patient presents with  . Medical Management of Chronic Issues  . Acute Visit    persisted lower back pain.     HPI: Patient is a 81 y.o. female seen in the AL at [facility] today for  evaluation of persisted lower back pain and other chronic medical conditions Problem List Items Addressed This Visit    Hypertension (Chronic)    Controlled on Amlodipine 10mg  and Losartan 100mg  and Metoprolol 75mg  bid.         Hypothyroidism (Chronic)    Takes Levothyroxine 73mcg 08/18/12,  TSH 2.76 02/25/14        Fracture, vertebra, pathologic - Primary (Chronic)    Will manage pain with Norco 5/325mg  bid since Tylenol 650mg  bid is not effective. Negative urine 07/21/14      Memory deficit (Chronic)    Still functioning in AL         Insomnia    Stable, takes Trazodone 25mg  daily.       Constipation    Stable on Senokot S II nightly.         GERD (gastroesophageal reflux disease)    Stable, takes Omeprazole 20mg  daily.        Lower back pain    07/19/14 X-ray lumbar spine and pelvis: T11 subacute process with more than 70% height loss 07/26/14 managing pain with Norco bid.           Review of Systems:  Review of Systems  Constitutional: Negative for fever, chills and diaphoresis.  HENT: Positive for hearing loss. Negative for congestion, ear discharge, ear pain, nosebleeds, sore throat and tinnitus.   Eyes: Negative for photophobia, pain, discharge and redness.  Respiratory: Negative for cough, shortness of breath, wheezing and stridor.   Cardiovascular: Positive for leg swelling. Negative for chest pain and palpitations.       Trace in ankles.   Gastrointestinal: Negative for nausea, vomiting, abdominal pain, diarrhea, constipation and blood in stool.  Endocrine: Negative for polydipsia.  Genitourinary: Positive for frequency.  Negative for dysuria, urgency, hematuria and flank pain.  Musculoskeletal: Positive for back pain. Negative for myalgias and neck pain.       Lower back across R+L SIJ  Skin: Negative for rash.  Allergic/Immunologic: Negative for environmental allergies.  Neurological: Negative for dizziness, tremors, seizures, weakness and headaches.  Hematological: Does not bruise/bleed easily.  Psychiatric/Behavioral: Negative for suicidal ideas and hallucinations. The patient is not nervous/anxious.      Past Medical History  Diagnosis Date  . Arthritis   . Osteoarthritis   . Hypertension   . Cataracts, bilateral   . Closed fracture of unspecified part of lower end of humerus   . Retinal neovascularization NOS   . Macular degeneration (senile) of retina, unspecified   . Insomnia, unspecified   . Unspecified hypothyroidism   . Unspecified hearing loss   . Osteoarthrosis, unspecified whether generalized or localized, unspecified site   . Senile osteoporosis   . Urinary tract infection 08/10/2013  . Vertebral fracture 08/09/2013    T7 10%, T12 70%   . Subarachnoid hemorrhage following injury 03/31/2012  . Pancreatitis, acute 08/14/2013   Past Surgical History  Procedure Laterality Date  . Ankle fracture surgery  1960  . Abdominal hysterectomy  1942  . Closed reduction with humeral pin insertion  04/01/2012  Procedure: CLOSED REDUCTION WITH HUMERAL PIN INSERTION;  Surgeon: Schuyler Amor, MD;  Location: [facility];  Service: Orthopedics;  Laterality: Left;  . Bladder suspension     Social History:   reports that she has never smoked. She has never used smokeless tobacco. She reports that she does not drink alcohol or use illicit drugs.  Family History  Problem Relation Age of Onset  . Hypertension Mother     Medications: Patient's Medications  New Prescriptions   No medications on file  Previous Medications   ACETAMINOPHEN (TYLENOL) 500 MG TABLET    Take 500 mg by mouth every 6 (six)  hours as needed. Pain   AMLODIPINE (NORVASC) 10 MG TABLET    Take 10 mg by mouth daily.   ASPIRIN EC 81 MG TABLET    Take 81 mg by mouth daily.   CHOLECALCIFEROL (VITAMIN D-3) 1000 UNITS CAPS    Take 1,000 Units by mouth every morning.   FENTANYL (DURAGESIC - DOSED MCG/HR) 50 MCG/HR    Place 1 patch (50 mcg total) onto the skin every 3 (three) days.   LEVOTHYROXINE (SYNTHROID, LEVOTHROID) 50 MCG TABLET    Take 50 mcg by mouth daily before breakfast.   LOSARTAN (COZAAR) 100 MG TABLET    Take 100 mg by mouth daily.   METOPROLOL TARTRATE (LOPRESSOR) 25 MG TABLET    Take 75 mg by mouth 2 (two) times daily.   MULTIPLE VITAMINS-MINERALS (CERTAVITE/ANTIOXIDANTS PO)    Take 1 tablet by mouth every morning.   MULTIPLE VITAMINS-MINERALS (PRESERVISION AREDS 2 PO)    Take 1 capsule by mouth 2 (two) times daily.   OMEPRAZOLE (PRILOSEC) 20 MG CAPSULE    Take 20 mg by mouth daily.   RALOXIFENE (EVISTA) 60 MG TABLET    Take 60 mg by mouth daily.   SENNA-DOCUSATE (SENOKOT-S) 8.6-50 MG PER TABLET    Take 2 tablets by mouth at bedtime.   TRAZODONE (DESYREL) 50 MG TABLET    Take 50 mg by mouth. Take 1/2 tablet at bedtime for sleep  Modified Medications   No medications on file  Discontinued Medications   No medications on file     Physical Exam: Physical Exam  Constitutional: She is oriented to person, place, and time. She appears well-developed and well-nourished.  HENT:  Head: Normocephalic and atraumatic.  Eyes: Conjunctivae and EOM are normal. Pupils are equal, round, and reactive to light.  Neck: Normal range of motion. Neck supple.  Cardiovascular:  No murmur heard. Pulmonary/Chest: Effort normal and breath sounds normal. No respiratory distress. She has no wheezes. She has no rales. She exhibits no tenderness.  Abdominal: Soft. Bowel sounds are normal. She exhibits no distension and no mass. There is no tenderness. There is no rebound and no guarding.  Musculoskeletal: Normal range of motion. She  exhibits tenderness. She exhibits no edema.   Unsteady gait. Lower back across R+L SIJ   Neurological: She is alert and oriented to person, place, and time. She displays normal reflexes. No cranial nerve deficit. She exhibits normal muscle tone. Coordination normal.  Skin: Skin is warm and dry. No rash noted. No erythema.  Psychiatric: Her mood appears not anxious. Her affect is not angry, not labile and not inappropriate. Her speech is not rapid and/or pressured, not delayed and not slurred. She is not agitated, not aggressive, not slowed and not withdrawn. Thought content is not paranoid and not delusional. Cognition and memory are impaired. She does not express impulsivity or inappropriate judgment.  Depressed: flat affect. She exhibits abnormal recent memory.    Filed Vitals:   07/26/14 1327  BP: 132/74  Pulse: 64  Temp: 98.9 F (37.2 C)  TempSrc: Tympanic  Resp: 18      Labs reviewed: Basic Metabolic Panel:  Recent Labs  08/14/13 1605 08/15/13 0509 08/17/13 08/23/13 02/25/14  NA 133* 133*  --  137 139  K 3.9 3.6*  --  3.9 3.9  CL 94* 97  --   --   --   CO2 24 23  --   --   --   GLUCOSE 149* 136*  --   --   --   BUN 15 12  --  14 16  CREATININE 0.58 0.52  --  0.5 0.7  CALCIUM 9.8 8.7  --   --   --   TSH  --   --  1.75  --  2.76   Liver Function Tests:  Recent Labs  08/14/13 1605 08/15/13 0509 02/25/14  AST 22 19 16   ALT 22 19 16   ALKPHOS 61 52 47  BILITOT 0.9 1.0  --   PROT 7.6 6.2  --   ALBUMIN 3.7 3.2*  --     Recent Labs  08/14/13 1605 08/15/13 0509  LIPASE 81* 30   No results for input(s): AMMONIA in the last 8760 hours. CBC:  Recent Labs  08/14/13 1605 08/15/13 0509 08/19/13  WBC 7.7 6.6 4.1  NEUTROABS 3.6  --   --   HGB 15.3* 13.3 13.3  HCT 43.9 38.1 37  MCV 95.6 94.5  --   PLT 201 192 216   Lipid Panel: No results for input(s): CHOL, HDL, LDLCALC, TRIG, CHOLHDL, LDLDIRECT in the last 8760 hours.   Past Procedures:  08/14/2013  CLINICAL DATA: Epigastric pain EXAM: CHEST 2 VIEW . IMPRESSION: No active cardiopulmonary disease.   US Abdomen Complete  08/15/2013 CLINICAL DATA: Abdominal pain. Elevated lipase. EXAM: ULTRASOUND ABDOMEN IMPRESSION: Hepatic steatosis. No acute abnormalities. Specifically, no gallstones or biliary ductal dilatation.  Ct Abdomen Pelvis W Contrast  08/14/2013 CLINICAL DATA: Abdominal pain EXAM: CT ABDOMEN AND PELVIS WITH CONTRAST IMPRESSION: 1. Atelectasis versus scarring within the lung bases. 2. Stable chronic findings within the abdomen without evidence of focal or acute abnormalities. No CT findings accounting for the patient's clinical presentation. 3. Mild hepatic steatosis  08/17/13 X-ray L ribs: no acute fracture or lytic lession involving the well visualized portions of the left ribs.   12/25/13 X-ray L foot and toes: no acute osseous abnormality, degenerative changes throughout the foot, moderate first metatarsophalangeal joint degenerative changes and hallux valgus deformity, possible age indeterminate erosive ahnges of teh medial border of the first metatarsal head may be consistent with gout.   07/17/14 X-ray lumbar spine and pelvis: T11 vertebral body 70% height loss-likely subacute.    Assessment/Plan Fracture, vertebra, pathologic Will manage pain with Norco 5/325mg  bid since Tylenol 650mg  bid is not effective. Negative urine 07/21/14   Constipation Stable on Senokot S II nightly.      GERD (gastroesophageal reflux disease) Stable, takes Omeprazole 20mg  daily.     Hypertension Controlled on Amlodipine 10mg  and Losartan 100mg  and Metoprolol 75mg  bid.      Hypothyroidism Takes Levothyroxine 67mcg 08/18/12,  TSH 2.76 02/25/14     Insomnia Stable, takes Trazodone 25mg  daily.    Lower back pain 07/19/14 X-ray lumbar spine and pelvis: T11 subacute process with more than 70% height loss 07/26/14 managing pain with Norco bid.  Memory deficit Still functioning in AL         Family/ Staff Communication: observe the patient.   Goals of Care: AL  Labs/tests ordered: none

## 2014-07-26 NOTE — Assessment & Plan Note (Signed)
Still functioning in AL

## 2014-07-26 NOTE — Assessment & Plan Note (Signed)
Stable, takes Trazodone 25mg  daily.

## 2014-07-26 NOTE — Assessment & Plan Note (Signed)
Stable on Senokot S II nightly.

## 2014-07-26 NOTE — Assessment & Plan Note (Signed)
Takes Levothyroxine 62mcg 08/18/12,  TSH 2.76 02/25/14

## 2014-07-26 NOTE — Assessment & Plan Note (Signed)
07/19/14 X-ray lumbar spine and pelvis: T11 subacute process with more than 70% height loss 07/26/14 managing pain with Norco bid.

## 2014-07-29 DIAGNOSIS — Z961 Presence of intraocular lens: Secondary | ICD-10-CM | POA: Diagnosis not present

## 2014-07-29 DIAGNOSIS — H3532 Exudative age-related macular degeneration: Secondary | ICD-10-CM | POA: Diagnosis not present

## 2014-08-05 DIAGNOSIS — M533 Sacrococcygeal disorders, not elsewhere classified: Secondary | ICD-10-CM | POA: Diagnosis not present

## 2014-08-19 ENCOUNTER — Other Ambulatory Visit: Payer: Self-pay | Admitting: *Deleted

## 2014-08-19 MED ORDER — HYDROCODONE-ACETAMINOPHEN 5-325 MG PO TABS: ORAL_TABLET | ORAL | Status: DC

## 2014-08-19 NOTE — Telephone Encounter (Signed)
[facility] 

## 2014-09-22 DIAGNOSIS — N390 Urinary tract infection, site not specified: Secondary | ICD-10-CM | POA: Diagnosis not present

## 2014-09-27 ENCOUNTER — Non-Acute Institutional Stay: Payer: Medicare Other | Admitting: Nurse Practitioner

## 2014-09-27 ENCOUNTER — Encounter: Payer: Self-pay | Admitting: Nurse Practitioner

## 2014-09-27 DIAGNOSIS — N390 Urinary tract infection, site not specified: Secondary | ICD-10-CM | POA: Diagnosis not present

## 2014-09-27 DIAGNOSIS — E039 Hypothyroidism, unspecified: Secondary | ICD-10-CM | POA: Diagnosis not present

## 2014-09-27 DIAGNOSIS — M5440 Lumbago with sciatica, unspecified side: Secondary | ICD-10-CM

## 2014-09-27 DIAGNOSIS — M8448XS Pathological fracture, other site, sequela: Secondary | ICD-10-CM

## 2014-09-27 DIAGNOSIS — I10 Essential (primary) hypertension: Secondary | ICD-10-CM

## 2014-09-27 DIAGNOSIS — K5900 Constipation, unspecified: Secondary | ICD-10-CM | POA: Diagnosis not present

## 2014-09-27 DIAGNOSIS — R413 Other amnesia: Secondary | ICD-10-CM

## 2014-09-27 DIAGNOSIS — G4700 Insomnia, unspecified: Secondary | ICD-10-CM

## 2014-09-27 DIAGNOSIS — K219 Gastro-esophageal reflux disease without esophagitis: Secondary | ICD-10-CM | POA: Diagnosis not present

## 2014-09-27 NOTE — Assessment & Plan Note (Signed)
07/19/14 X-ray lumbar spine and pelvis: T11 subacute process with more than 70% height loss 07/26/14 managing pain with Norco bid.  Pain is reasonably controlled. Continue Norco bid.

## 2014-09-27 NOTE — Progress Notes (Signed)
Patient ID: Melissa Carroll, female   DOB: [birth year], 81 y.o.   MRN: [id number]   Code Status: DNR  No Known Allergies  Chief Complaint  Patient presents with  . Medical Management of Chronic Issues  . Acute Visit    UTI    HPI: Patient is a 81 y.o. female seen in the AL at [facility] today for  evaluation of UTI and chronic medical conditions Problem List Items Addressed This Visit    Hypertension (Chronic)    Controlled on Amlodipine 10mg  and Losartan 100mg  and Metoprolol 75mg  bid.         Hypothyroidism (Chronic)    Takes Levothyroxine 109mcg 08/18/12,  TSH 2.76 02/25/14        Fracture, vertebra, pathologic (Chronic)    Better controlled pain with Norco 5/325mg  bid since Tylenol 650mg  bid is not effective. Negative urine 07/21/14       Memory deficit (Chronic)    Still functioning in AL         Insomnia    Stable, takes Trazodone 25mg  daily.       Constipation    Stable on Senokot S II nightly.        UTI (urinary tract infection) - Primary    06/27/14 urine culture K. Pneumoniae-10 day course of Levaquin 250mg  daily started  06/27/14 09/24/14 urine culture E. Coli 09/26/14 Levaquin 250mg  daily x 7 days.        GERD (gastroesophageal reflux disease)    Stable, takes Omeprazole 20mg  daily.        Lower back pain    07/19/14 X-ray lumbar spine and pelvis: T11 subacute process with more than 70% height loss 07/26/14 managing pain with Norco bid.  Pain is reasonably controlled. Continue Norco bid.          Review of Systems:  Review of Systems  Constitutional: Negative for fever, chills and diaphoresis.  HENT: Positive for hearing loss. Negative for congestion, ear discharge, ear pain, nosebleeds, sore throat and tinnitus.   Eyes: Negative for photophobia, pain, discharge and redness.  Respiratory: Negative for cough, shortness of breath, wheezing and stridor.   Cardiovascular: Positive for leg swelling. Negative for chest pain and palpitations.      Trace in ankles.   Gastrointestinal: Negative for nausea, vomiting, abdominal pain, diarrhea, constipation and blood in stool.  Endocrine: Negative for polydipsia.  Genitourinary: Positive for frequency. Negative for dysuria, urgency, hematuria and flank pain.  Musculoskeletal: Positive for back pain. Negative for myalgias and neck pain.       Lower back across R+L SIJ-better since Norco bid.   Skin: Negative for rash.  Allergic/Immunologic: Negative for environmental allergies.  Neurological: Negative for dizziness, tremors, seizures, weakness and headaches.  Hematological: Does not bruise/bleed easily.  Psychiatric/Behavioral: Negative for suicidal ideas and hallucinations. The patient is not nervous/anxious.      Past Medical History  Diagnosis Date  . Arthritis   . Osteoarthritis   . Hypertension   . Cataracts, bilateral   . Closed fracture of unspecified part of lower end of humerus   . Retinal neovascularization NOS   . Macular degeneration (senile) of retina, unspecified   . Insomnia, unspecified   . Unspecified hypothyroidism   . Unspecified hearing loss   . Osteoarthrosis, unspecified whether generalized or localized, unspecified site   . Senile osteoporosis   . Urinary tract infection 08/10/2013  . Vertebral fracture 08/09/2013    T7 10%, T12 70%   . Subarachnoid hemorrhage following  injury 03/31/2012  . Pancreatitis, acute 08/14/2013   Past Surgical History  Procedure Laterality Date  . Ankle fracture surgery  1960  . Abdominal hysterectomy  1942  . Closed reduction with humeral pin insertion  04/01/2012    Procedure: CLOSED REDUCTION WITH HUMERAL PIN INSERTION;  Surgeon: Schuyler Amor, MD;  Location: [facility];  Service: Orthopedics;  Laterality: Left;  . Bladder suspension     Social History:   reports that she has never smoked. She has never used smokeless tobacco. She reports that she does not drink alcohol or use illicit drugs.  Family History  Problem  Relation Age of Onset  . Hypertension Mother     Medications: Patient's Medications  New Prescriptions   No medications on file  Previous Medications   ACETAMINOPHEN (TYLENOL) 500 MG TABLET    Take 500 mg by mouth every 6 (six) hours as needed. Pain   AMLODIPINE (NORVASC) 10 MG TABLET    Take 10 mg by mouth daily.   ASPIRIN EC 81 MG TABLET    Take 81 mg by mouth daily.   CHOLECALCIFEROL (VITAMIN D-3) 1000 UNITS CAPS    Take 1,000 Units by mouth every morning.   FENTANYL (DURAGESIC - DOSED MCG/HR) 50 MCG/HR    Place 1 patch (50 mcg total) onto the skin every 3 (three) days.   HYDROCODONE-ACETAMINOPHEN (NORCO/VICODIN) 5-325 MG PER TABLET    Take one tablet by mouth twice daily for pain   LEVOTHYROXINE (SYNTHROID, LEVOTHROID) 50 MCG TABLET    Take 50 mcg by mouth daily before breakfast.   LOSARTAN (COZAAR) 100 MG TABLET    Take 100 mg by mouth daily.   METOPROLOL TARTRATE (LOPRESSOR) 25 MG TABLET    Take 75 mg by mouth 2 (two) times daily.   MULTIPLE VITAMINS-MINERALS (CERTAVITE/ANTIOXIDANTS PO)    Take 1 tablet by mouth every morning.   MULTIPLE VITAMINS-MINERALS (PRESERVISION AREDS 2 PO)    Take 1 capsule by mouth 2 (two) times daily.   OMEPRAZOLE (PRILOSEC) 20 MG CAPSULE    Take 20 mg by mouth daily.   RALOXIFENE (EVISTA) 60 MG TABLET    Take 60 mg by mouth daily.   SENNA-DOCUSATE (SENOKOT-S) 8.6-50 MG PER TABLET    Take 2 tablets by mouth at bedtime.   TRAZODONE (DESYREL) 50 MG TABLET    Take 50 mg by mouth. Take 1/2 tablet at bedtime for sleep  Modified Medications   No medications on file  Discontinued Medications   No medications on file     Physical Exam: Physical Exam  Constitutional: She is oriented to person, place, and time. She appears well-developed and well-nourished.  HENT:  Head: Normocephalic and atraumatic.  Eyes: Conjunctivae and EOM are normal. Pupils are equal, round, and reactive to light.  Neck: Normal range of motion. Neck supple.  Cardiovascular:  No  murmur heard. Pulmonary/Chest: Effort normal and breath sounds normal. No respiratory distress. She has no wheezes. She has no rales. She exhibits no tenderness.  Abdominal: Soft. Bowel sounds are normal. She exhibits no distension and no mass. There is no tenderness. There is no rebound and no guarding.  Musculoskeletal: Normal range of motion. She exhibits tenderness. She exhibits no edema.  Unsteady gait. Lower back across R+L SIJ-better managed with Norco bid   Neurological: She is alert and oriented to person, place, and time. She displays normal reflexes. No cranial nerve deficit. She exhibits normal muscle tone. Coordination normal.  Skin: Skin is warm and dry. No  rash noted. No erythema.  Psychiatric: Her mood appears not anxious. Her affect is not angry, not labile and not inappropriate. Her speech is not rapid and/or pressured, not delayed and not slurred. She is not agitated, not aggressive, not slowed and not withdrawn. Thought content is not paranoid and not delusional. Cognition and memory are impaired. She does not express impulsivity or inappropriate judgment. Depressed: flat affect. She exhibits abnormal recent memory.    Filed Vitals:   09/27/14 1442  BP: 164/79  Pulse: 65  Temp: 98.1 F (36.7 C)  TempSrc: Tympanic  Resp: 18      Labs reviewed: Basic Metabolic Panel:  Recent Labs  02/25/14  NA 139  K 3.9  BUN 16  CREATININE 0.7  TSH 2.76   Liver Function Tests:  Recent Labs  02/25/14  AST 16  ALT 16  ALKPHOS 47   No results for input(s): LIPASE, AMYLASE in the last 8760 hours. No results for input(s): AMMONIA in the last 8760 hours. CBC: No results for input(s): WBC, NEUTROABS, HGB, HCT, MCV, PLT in the last 8760 hours. Lipid Panel: No results for input(s): CHOL, HDL, LDLCALC, TRIG, CHOLHDL, LDLDIRECT in the last 8760 hours.   Past Procedures:  08/14/2013 CLINICAL DATA: Epigastric pain EXAM: CHEST 2 VIEW . IMPRESSION: No active cardiopulmonary  disease.   US Abdomen Complete  08/15/2013 CLINICAL DATA: Abdominal pain. Elevated lipase. EXAM: ULTRASOUND ABDOMEN IMPRESSION: Hepatic steatosis. No acute abnormalities. Specifically, no gallstones or biliary ductal dilatation.  Ct Abdomen Pelvis W Contrast  08/14/2013 CLINICAL DATA: Abdominal pain EXAM: CT ABDOMEN AND PELVIS WITH CONTRAST IMPRESSION: 1. Atelectasis versus scarring within the lung bases. 2. Stable chronic findings within the abdomen without evidence of focal or acute abnormalities. No CT findings accounting for the patient's clinical presentation. 3. Mild hepatic steatosis  08/17/13 X-ray L ribs: no acute fracture or lytic lession involving the well visualized portions of the left ribs.   12/25/13 X-ray L foot and toes: no acute osseous abnormality, degenerative changes throughout the foot, moderate first metatarsophalangeal joint degenerative changes and hallux valgus deformity, possible age indeterminate erosive ahnges of teh medial border of the first metatarsal head may be consistent with gout.   07/17/14 X-ray lumbar spine and pelvis: T11 vertebral body 70% height loss-likely subacute.    Assessment/Plan UTI (urinary tract infection) 06/27/14 urine culture K. Pneumoniae-10 day course of Levaquin 250mg  daily started  06/27/14 09/24/14 urine culture E. Coli 09/26/14 Levaquin 250mg  daily x 7 days.     Hypothyroidism Takes Levothyroxine 52mcg 08/18/12,  TSH 2.76 02/25/14     Fracture, vertebra, pathologic Better controlled pain with Norco 5/325mg  bid since Tylenol 650mg  bid is not effective. Negative urine 07/21/14    Memory deficit Still functioning in AL      Insomnia Stable, takes Trazodone 25mg  daily.    Constipation Stable on Senokot S II nightly.     GERD (gastroesophageal reflux disease) Stable, takes Omeprazole 20mg  daily.     Lower back pain 07/19/14 X-ray lumbar spine and pelvis: T11 subacute process with more than 70% height loss 07/26/14 managing  pain with Norco bid.  Pain is reasonably controlled. Continue Norco bid.    Hypertension Controlled on Amlodipine 10mg  and Losartan 100mg  and Metoprolol 75mg  bid.        Family/ Staff Communication: observe the patient.   Goals of Care: AL  Labs/tests ordered: none

## 2014-09-27 NOTE — Assessment & Plan Note (Signed)
Still functioning in AL

## 2014-09-27 NOTE — Assessment & Plan Note (Signed)
Stable, takes Omeprazole 20mg daily.  

## 2014-09-27 NOTE — Assessment & Plan Note (Signed)
Stable, takes Trazodone 25mg  daily.

## 2014-09-27 NOTE — Assessment & Plan Note (Signed)
Stable on Senokot S II nightly.

## 2014-09-27 NOTE — Assessment & Plan Note (Signed)
Controlled on Amlodipine 10mg  and Losartan 100mg  and Metoprolol 75mg  bid.

## 2014-09-27 NOTE — Assessment & Plan Note (Signed)
06/27/14 urine culture K. Pneumoniae-10 day course of Levaquin 250mg  daily started  06/27/14 09/24/14 urine culture E. Coli 09/26/14 Levaquin 250mg  daily x 7 days.

## 2014-09-27 NOTE — Assessment & Plan Note (Signed)
Better controlled pain with Norco 5/325mg  bid since Tylenol 650mg  bid is not effective. Negative urine 07/21/14

## 2014-09-27 NOTE — Assessment & Plan Note (Signed)
Takes Levothyroxine 52mcg 08/18/12,  TSH 2.76 02/25/14

## 2014-10-04 DIAGNOSIS — H35352 Cystoid macular degeneration, left eye: Secondary | ICD-10-CM | POA: Diagnosis not present

## 2014-10-04 DIAGNOSIS — H31011 Macula scars of posterior pole (postinflammatory) (post-traumatic), right eye: Secondary | ICD-10-CM | POA: Diagnosis not present

## 2014-10-04 DIAGNOSIS — H3532 Exudative age-related macular degeneration: Secondary | ICD-10-CM | POA: Diagnosis not present

## 2014-10-04 DIAGNOSIS — H31019 Macula scars of posterior pole (postinflammatory) (post-traumatic), unspecified eye: Secondary | ICD-10-CM | POA: Insufficient documentation

## 2014-10-17 ENCOUNTER — Other Ambulatory Visit: Payer: Self-pay | Admitting: *Deleted

## 2014-10-17 MED ORDER — HYDROCODONE-ACETAMINOPHEN 5-325 MG PO TABS: ORAL_TABLET | ORAL | Status: DC

## 2014-10-17 NOTE — Telephone Encounter (Signed)
[facility] 

## 2014-11-02 DIAGNOSIS — C44311 Basal cell carcinoma of skin of nose: Secondary | ICD-10-CM | POA: Diagnosis not present

## 2014-11-02 DIAGNOSIS — L821 Other seborrheic keratosis: Secondary | ICD-10-CM | POA: Diagnosis not present

## 2014-11-02 DIAGNOSIS — L570 Actinic keratosis: Secondary | ICD-10-CM | POA: Diagnosis not present

## 2014-12-03 DIAGNOSIS — N390 Urinary tract infection, site not specified: Secondary | ICD-10-CM | POA: Diagnosis not present

## 2014-12-13 ENCOUNTER — Telehealth: Payer: Self-pay | Admitting: *Deleted

## 2014-12-13 NOTE — Telephone Encounter (Signed)
Received [place] forms (Long Term) for evaluation of cognitive impairment. Forms given to Dr. Nyoka Cowden to review and fill out.  [employer] #: [phone] Fax#: [phone] Policy #: [id number]

## 2014-12-19 DIAGNOSIS — Z08 Encounter for follow-up examination after completed treatment for malignant neoplasm: Secondary | ICD-10-CM | POA: Diagnosis not present

## 2014-12-19 DIAGNOSIS — Z85828 Personal history of other malignant neoplasm of skin: Secondary | ICD-10-CM | POA: Diagnosis not present

## 2014-12-20 ENCOUNTER — Other Ambulatory Visit: Payer: Self-pay | Admitting: *Deleted

## 2014-12-20 MED ORDER — HYDROCODONE-ACETAMINOPHEN 5-325 MG PO TABS: ORAL_TABLET | ORAL | Status: DC

## 2014-12-20 NOTE — Telephone Encounter (Signed)
[facility] Fax [facility]der

## 2014-12-27 DIAGNOSIS — H6123 Impacted cerumen, bilateral: Secondary | ICD-10-CM | POA: Diagnosis not present

## 2015-01-12 DIAGNOSIS — H6122 Impacted cerumen, left ear: Secondary | ICD-10-CM | POA: Diagnosis not present

## 2015-01-23 DIAGNOSIS — N390 Urinary tract infection, site not specified: Secondary | ICD-10-CM | POA: Diagnosis not present

## 2015-01-24 DIAGNOSIS — H43813 Vitreous degeneration, bilateral: Secondary | ICD-10-CM | POA: Diagnosis not present

## 2015-01-24 DIAGNOSIS — H43819 Vitreous degeneration, unspecified eye: Secondary | ICD-10-CM | POA: Insufficient documentation

## 2015-01-24 DIAGNOSIS — H35033 Hypertensive retinopathy, bilateral: Secondary | ICD-10-CM | POA: Diagnosis not present

## 2015-01-24 DIAGNOSIS — H3532 Exudative age-related macular degeneration: Secondary | ICD-10-CM | POA: Diagnosis not present

## 2015-01-24 DIAGNOSIS — Z961 Presence of intraocular lens: Secondary | ICD-10-CM | POA: Diagnosis not present

## 2015-01-24 DIAGNOSIS — H35039 Hypertensive retinopathy, unspecified eye: Secondary | ICD-10-CM | POA: Insufficient documentation

## 2015-01-27 ENCOUNTER — Encounter: Payer: Self-pay | Admitting: Nurse Practitioner

## 2015-01-27 ENCOUNTER — Non-Acute Institutional Stay: Payer: Medicare Other | Admitting: Nurse Practitioner

## 2015-01-27 DIAGNOSIS — M5440 Lumbago with sciatica, unspecified side: Secondary | ICD-10-CM

## 2015-01-27 DIAGNOSIS — E039 Hypothyroidism, unspecified: Secondary | ICD-10-CM

## 2015-01-27 DIAGNOSIS — N3000 Acute cystitis without hematuria: Secondary | ICD-10-CM

## 2015-01-27 DIAGNOSIS — I10 Essential (primary) hypertension: Secondary | ICD-10-CM

## 2015-01-27 DIAGNOSIS — K5900 Constipation, unspecified: Secondary | ICD-10-CM

## 2015-01-27 DIAGNOSIS — R413 Other amnesia: Secondary | ICD-10-CM

## 2015-01-27 NOTE — Progress Notes (Signed)
Patient ID: Melissa Carroll, female   DOB: 1916-07-07, 81 y.o.   MRN: [id number]  Location:  [facility] Provider:  Marlana Latus NP  Code Status:  DNR Goals of care: Advanced Directive information    Chief Complaint  Patient presents with  . Medical Management of Chronic Issues  . Acute Visit    UTI     HPI: Patient is a 81 y.o. female seen in the AL at [facility] today for evaluation of UTI, urine culture 01/25/15 showed E. Coli >100,000c/ml, 7 day course of Nitrofurantoin 100mg  bid started and tolerated. Denied nausea, abd or suprapubic area pain or discomfort, or dysuria. She is afebrile.   Review of Systems:  Review of Systems  Constitutional: Negative for fever, chills and diaphoresis.  HENT: Positive for hearing loss. Negative for congestion, ear discharge, ear pain and tinnitus.   Eyes: Negative for pain, discharge and redness.  Respiratory: Negative for cough, shortness of breath and wheezing.   Cardiovascular: Positive for leg swelling. Negative for chest pain and palpitations.       Trace in ankles.   Gastrointestinal: Negative for nausea, vomiting, abdominal pain, diarrhea and constipation.  Genitourinary: Positive for frequency. Negative for dysuria, urgency, hematuria and flank pain.  Musculoskeletal: Positive for back pain. Negative for myalgias and neck pain.       Lower back across R+L SIJ-better since Norco bid.   Skin: Negative for rash.  Neurological: Negative for dizziness, tremors, seizures, weakness and headaches.  Psychiatric/Behavioral: Positive for memory loss. Negative for suicidal ideas and hallucinations. The patient is not nervous/anxious.     Past Medical History  Diagnosis Date  . Arthritis   . Osteoarthritis   . Hypertension   . Cataracts, bilateral   . Closed fracture of unspecified part of lower end of humerus   . Retinal neovascularization NOS   . Macular degeneration (senile) of retina, unspecified   . Insomnia, unspecified   .  Unspecified hypothyroidism   . Unspecified hearing loss   . Osteoarthrosis, unspecified whether generalized or localized, unspecified site   . Senile osteoporosis   . Urinary tract infection 08/10/2013  . Vertebral fracture 08/09/2013    T7 10%, T12 70%   . Subarachnoid hemorrhage following injury 03/31/2012  . Pancreatitis, acute 08/14/2013    Patient Active Problem List   Diagnosis Date Noted  . Lower back pain 07/19/2014  . Left foot pain 01/07/2014  . Memory deficit 01/07/2014  . GERD (gastroesophageal reflux disease) 09/14/2013  . Musculoskeletal pain 08/14/2013  . Hyponatremia 08/14/2013  . Hyperglycemia 08/14/2013  . UTI (urinary tract infection) 08/10/2013  . Senile osteoporosis 08/10/2013  . Fracture, vertebra, pathologic 08/09/2013  . Constipation 09/11/2012  . Hypothyroidism 08/14/2012  . Osteoarthrosis, unspecified whether generalized or localized, unspecified site 08/14/2012  . Insomnia 08/14/2012  . Hypertension 04/02/2012  . Fall 03/31/2012  . Hard of hearing 03/31/2012  . Subarachnoid hemorrhage following injury 03/31/2012    No Known Allergies  Medications: Patient's Medications  New Prescriptions   No medications on file  Previous Medications   ACETAMINOPHEN (TYLENOL) 500 MG TABLET    Take 500 mg by mouth every 6 (six) hours as needed. Pain   AMLODIPINE (NORVASC) 10 MG TABLET    Take 10 mg by mouth daily.   ASPIRIN EC 81 MG TABLET    Take 81 mg by mouth daily.   CHOLECALCIFEROL (VITAMIN D-3) 1000 UNITS CAPS    Take 1,000 Units by mouth every morning.   FENTANYL (  DURAGESIC - DOSED MCG/HR) 50 MCG/HR    Place 1 patch (50 mcg total) onto the skin every 3 (three) days.   HYDROCODONE-ACETAMINOPHEN (NORCO/VICODIN) 5-325 MG PER TABLET    Take one tablet by mouth twice daily for pain   LEVOTHYROXINE (SYNTHROID, LEVOTHROID) 50 MCG TABLET    Take 50 mcg by mouth daily before breakfast.   LOSARTAN (COZAAR) 100 MG TABLET    Take 100 mg by mouth daily.   METOPROLOL  TARTRATE (LOPRESSOR) 25 MG TABLET    Take 75 mg by mouth 2 (two) times daily.   MULTIPLE VITAMINS-MINERALS (CERTAVITE/ANTIOXIDANTS PO)    Take 1 tablet by mouth every morning.   MULTIPLE VITAMINS-MINERALS (PRESERVISION AREDS 2 PO)    Take 1 capsule by mouth 2 (two) times daily.   OMEPRAZOLE (PRILOSEC) 20 MG CAPSULE    Take 20 mg by mouth daily.   RALOXIFENE (EVISTA) 60 MG TABLET    Take 60 mg by mouth daily.   SENNA-DOCUSATE (SENOKOT-S) 8.6-50 MG PER TABLET    Take 2 tablets by mouth at bedtime.   TRAZODONE (DESYREL) 50 MG TABLET    Take 50 mg by mouth. Take 1/2 tablet at bedtime for sleep  Modified Medications   No medications on file  Discontinued Medications   No medications on file    Physical Exam: Filed Vitals:   01/27/15 1141  BP: 158/88  Pulse: 69  Temp: 98 F (36.7 C)  TempSrc: Tympanic  Resp: 18   There is no weight on file to calculate BMI.  Physical Exam  Constitutional: She is oriented to person, place, and time. She appears well-developed and well-nourished.  HENT:  Head: Normocephalic and atraumatic.  Eyes: Conjunctivae and EOM are normal. Pupils are equal, round, and reactive to light.  Neck: Normal range of motion. Neck supple.  Cardiovascular:  No murmur heard. Pulmonary/Chest: Effort normal and breath sounds normal. No respiratory distress. She has no wheezes. She has no rales. She exhibits no tenderness.  Abdominal: Soft. Bowel sounds are normal. She exhibits no distension and no mass. There is no tenderness. There is no rebound and no guarding.  Musculoskeletal: Normal range of motion. She exhibits tenderness. She exhibits no edema.  Unsteady gait. Lower back across R+L SIJ-better managed with Norco bid   Neurological: She is alert and oriented to person, place, and time. She displays normal reflexes. No cranial nerve deficit. She exhibits normal muscle tone. Coordination normal.  Skin: Skin is warm and dry. No rash noted. No erythema.  Psychiatric: She  has a normal mood and affect. Her behavior is normal. Judgment and thought content normal. Her mood appears not anxious. Her affect is not angry, not labile and not inappropriate. Her speech is not rapid and/or pressured, not delayed and not slurred. She is not agitated, not aggressive, not slowed and not withdrawn. Thought content is not paranoid and not delusional. Cognition and memory are impaired. She does not express impulsivity or inappropriate judgment. Depressed: flat affect. She exhibits abnormal recent memory.    Labs reviewed: Basic Metabolic Panel:  Recent Labs  02/25/14  NA 139  K 3.9  BUN 16  CREATININE 0.7    Liver Function Tests:  Recent Labs  02/25/14  AST 16  ALT 16  ALKPHOS 47    CBC: No results for input(s): WBC, NEUTROABS, HGB, HCT, MCV, PLT in the last 8760 hours.  Lab Results  Component Value Date   TSH 2.76 02/25/2014   Lab Results  Component Value Date  HGBA1C 6.0* 08/14/2013   No results found for: CHOL, HDL, LDLCALC, LDLDIRECT, TRIG, CHOLHDL  Significant Diagnostic Results since last visit: positive urine culture for UTI  Patient Care Team: Estill Dooms, MD as PCP - General (Internal Medicine) Keysean Savino X, NP as Nurse Practitioner (Nurse Practitioner) Charlotte Crumb, MD as Consulting Physician (Orthopedic Surgery) Wallene Huh, DPM as Consulting Physician (Podiatry) Gaynelle Arabian, MD as Consulting Physician (Orthopedic Surgery) Zebedee Iba, MD as Referring Physician (Ophthalmology) Newman Pies, MD as Consulting Physician (Neurosurgery)  Assessment/Plan Problem List Items Addressed This Visit    Hypertension (Chronic)    Controlled, continue Amlodipine 10mg , Losartan 100mg , Metoprolol 75mg  bid.       Hypothyroidism (Chronic)    Continue Levothyroxine 101mcg, lat TSH 2.76 02/25/14      Memory deficit (Chronic)    Still functioning adequately in AL        Constipation    Stable, continue Senokot S II nightly.        UTI (urinary tract infection) - Primary    06/27/14 urine culture K. Pneumoniae-10 day course of Levaquin 250mg  daily started  06/27/14 09/24/14 urine culture E. Coli 09/26/14 Levaquin 250mg  daily x 7 days.  01/25/15 urine culture E. Coli Nitrofurantoin 100mg  bid x 7 days.      Lower back pain    07/19/14 X-ray lumbar spine and pelvis: T11 subacute process with more than 70% height loss 07/26/14 managing pain with Norco bid.  Pain is reasonably controlled. Continue Norco bid.           Family/ staff Communication: continue AL for care needs. Monitor s/s of UTI.   Labs/tests ordered: none   ManXie Jalah Warmuth NP [facility] [address], [place] [address] On Call:  [phone] & follow prompts after 5pm & weekends Office Phone:  [phone] Office Fax:  [phone]

## 2015-01-31 NOTE — Assessment & Plan Note (Signed)
Still functioning adequately in AL

## 2015-01-31 NOTE — Assessment & Plan Note (Signed)
Continue Levothyroxine 76mcg, lat TSH 2.76 02/25/14

## 2015-01-31 NOTE — Assessment & Plan Note (Signed)
Stable, continue Senokot S II nightly.  

## 2015-01-31 NOTE — Assessment & Plan Note (Signed)
06/27/14 urine culture K. Pneumoniae-10 day course of Levaquin 250mg  daily started  06/27/14 09/24/14 urine culture E. Coli 09/26/14 Levaquin 250mg  daily x 7 days.  01/25/15 urine culture E. Coli Nitrofurantoin 100mg  bid x 7 days.

## 2015-01-31 NOTE — Assessment & Plan Note (Signed)
07/19/14 X-ray lumbar spine and pelvis: T11 subacute process with more than 70% height loss 07/26/14 managing pain with Norco bid.  Pain is reasonably controlled. Continue Norco bid.  

## 2015-01-31 NOTE — Assessment & Plan Note (Signed)
Controlled, continue Amlodipine 10mg , Losartan 100mg , Metoprolol 75mg  bid.

## 2015-02-07 ENCOUNTER — Encounter: Payer: Self-pay | Admitting: Internal Medicine

## 2015-02-07 ENCOUNTER — Other Ambulatory Visit: Payer: Self-pay

## 2015-02-07 ENCOUNTER — Non-Acute Institutional Stay: Payer: Medicare Other | Admitting: Internal Medicine

## 2015-02-07 VITALS — BP 138/76 | HR 90 | Temp 98.1°F | Resp 20 | Ht 61.0 in | Wt 153.8 lb

## 2015-02-07 DIAGNOSIS — E039 Hypothyroidism, unspecified: Secondary | ICD-10-CM | POA: Diagnosis not present

## 2015-02-07 DIAGNOSIS — R413 Other amnesia: Secondary | ICD-10-CM

## 2015-02-07 DIAGNOSIS — I10 Essential (primary) hypertension: Secondary | ICD-10-CM

## 2015-02-07 DIAGNOSIS — M8448XS Pathological fracture, other site, sequela: Secondary | ICD-10-CM

## 2015-02-07 DIAGNOSIS — N3000 Acute cystitis without hematuria: Secondary | ICD-10-CM

## 2015-02-07 DIAGNOSIS — R739 Hyperglycemia, unspecified: Secondary | ICD-10-CM

## 2015-02-07 MED ORDER — HYDROCODONE-ACETAMINOPHEN 5-325 MG PO TABS: ORAL_TABLET | ORAL | Status: DC

## 2015-02-07 NOTE — Progress Notes (Addendum)
Patient ID: Melissa Carroll, female   DOB: 16-Nov-1916, 81 y.o.   MRN: [id number]    [place] Room Number: 26  Place of Service: Clinic (12)     No Known Allergies  Chief Complaint  Patient presents with  . Medical Management of Chronic Issues    HPI:   Fracture, vertebra, pathologic, sequela - noted in March 2016. Initially had significant amounts of back pain. Pain was controlled by using Norco 5/325 twice a day. Today says that she has no pain. She doesn't recall pain in the past. She remains on the Norco twice a day.  Essential hypertension - controlled  Hyperglycemia - controlled  Hypothyroidism, unspecified hypothyroidism type - compensated  Memory deficit - gradually worsening  Acute cystitis without hematuria -Escherichia coli treated with Macrodantin 2 weeks ago    Medications: Patient's Medications  New Prescriptions   No medications on file  Previous Medications   ACETAMINOPHEN (TYLENOL) 500 MG TABLET    Take 500 mg by mouth every 6 (six) hours as needed. Pain   AMLODIPINE (NORVASC) 10 MG TABLET    Take 10 mg by mouth daily.   ASPIRIN EC 81 MG TABLET    Take 81 mg by mouth daily.   CHOLECALCIFEROL (VITAMIN D-3) 1000 UNITS CAPS    Take 1,000 Units by mouth every morning.   FENTANYL (DURAGESIC - DOSED MCG/HR) 50 MCG/HR    Place 1 patch (50 mcg total) onto the skin every 3 (three) days.   HYDROCODONE-ACETAMINOPHEN (NORCO/VICODIN) 5-325 MG PER TABLET    Take one tablet by mouth twice daily for pain   LEVOTHYROXINE (SYNTHROID, LEVOTHROID) 50 MCG TABLET    Take 50 mcg by mouth daily before breakfast.   LOSARTAN (COZAAR) 100 MG TABLET    Take 100 mg by mouth daily.   METOPROLOL TARTRATE (LOPRESSOR) 25 MG TABLET    Take 75 mg by mouth 2 (two) times daily.   MULTIPLE VITAMINS-MINERALS (CERTAVITE/ANTIOXIDANTS PO)    Take 1 tablet by mouth every morning.   MULTIPLE VITAMINS-MINERALS (PRESERVISION AREDS 2 PO)    Take 1 capsule by mouth 2 (two)  times daily.   OMEPRAZOLE (PRILOSEC) 20 MG CAPSULE    Take 20 mg by mouth daily.   RALOXIFENE (EVISTA) 60 MG TABLET    Take 60 mg by mouth daily.   SENNA-DOCUSATE (SENOKOT-S) 8.6-50 MG PER TABLET    Take 2 tablets by mouth at bedtime.   TRAZODONE (DESYREL) 50 MG TABLET    Take 50 mg by mouth. Take 1/2 tablet at bedtime for sleep  Modified Medications   No medications on file  Discontinued Medications   No medications on file     Review of Systems  Constitutional: Negative for fever, chills, diaphoresis, activity change, appetite change, fatigue and unexpected weight change.  HENT: Positive for hearing loss. Negative for congestion, ear discharge, postnasal drip, rhinorrhea, trouble swallowing and voice change.   Eyes: Negative for pain, discharge, itching and visual disturbance.  Respiratory: Negative for cough, choking, chest tightness, shortness of breath and wheezing.   Cardiovascular: Negative for chest pain, palpitations and leg swelling.  Gastrointestinal: Negative for nausea, vomiting, abdominal pain, diarrhea and constipation.  Endocrine: Negative for cold intolerance, heat intolerance, polydipsia, polyphagia and polyuria.       Compensated hypothyroidismism  Genitourinary: Negative for dysuria, urgency, frequency and flank pain.  Musculoskeletal: Positive for back pain (improved), arthralgias (left arm) and gait problem. Negative for neck pain and neck stiffness.  Skin: Negative for pallor, rash and wound.  Allergic/Immunologic: Negative.   Neurological: Negative for tremors, syncope, speech difficulty, weakness, numbness and headaches.  Hematological: Negative.   Psychiatric/Behavioral: Positive for confusion (occasionally) and sleep disturbance. Negative for hallucinations, behavioral problems, dysphoric mood and agitation. The patient is not nervous/anxious.     Filed Vitals:   02/07/15 0945  BP: 138/76  Pulse: 90  Temp: 98.1 F (36.7 C)  TempSrc: Oral  Resp: 20    Height: _0  (1.549 m)  Weight: 153 lb 12.8 oz (69.763 kg)  SpO2: 95%   Body mass index is 29.08 kg/(m^2).  Physical Exam  Constitutional: She is oriented to person, place, and time. She appears well-developed and well-nourished.  HENT:  Head: Normocephalic and atraumatic.  Severe hearing loss  Eyes: Conjunctivae and EOM are normal. Pupils are equal, round, and reactive to light.  Neck: Normal range of motion. Neck supple. No JVD present. No tracheal deviation present. No thyromegaly present.  Cardiovascular: Normal rate, regular rhythm, normal heart sounds and intact distal pulses.  Exam reveals no gallop and no friction rub.   No murmur heard. Pulmonary/Chest: Effort normal and breath sounds normal. No respiratory distress. She has no wheezes. She has no rales.  Abdominal: Soft. She exhibits no distension and no mass. There is no tenderness.  Musculoskeletal: Normal range of motion. She exhibits no edema or tenderness.  Unsteady gait. Using walker with wheels.  Lymphadenopathy:    She has no cervical adenopathy.  Neurological: She is alert and oriented to person, place, and time. She displays normal reflexes. No cranial nerve deficit. She exhibits normal muscle tone. Coordination normal.  Skin: Skin is warm and dry. No rash noted. No erythema. No pallor.  Psychiatric: She has a normal mood and affect. Her behavior is normal. Her mood appears not anxious. Her affect is not angry, not labile and not inappropriate. Her speech is not rapid and/or pressured, not delayed and not slurred. She is not agitated, not aggressive, not slowed and not withdrawn. Thought content is not paranoid and not delusional. Cognition and memory are impaired. She does not express impulsivity or inappropriate judgment. Depressed: flat affect. She exhibits abnormal recent memory.     Labs reviewed: Lab Summary Latest Ref Rng 02/25/2014 08/23/2013 08/19/2013 08/15/2013 08/14/2013  Hemoglobin 12.0 - 16.0 g/dL (None)  (None) 13.3 13.3 15.3(H)  Hematocrit 36 - 46 % (None) (None) 37 38.1 43.9  White count - (None) (None) 4.1 6.6 7.7  Platelet count 150 - 399 K/L (None) (None) 216 192 201  Sodium 137 - 147 mmol/L 139 137 (None) 133(L) 133(L)  Potassium 3.4 - 5.3 mmol/L 3.9 3.9 (None) 3.6(L) 3.9  Calcium 8.4 - 10.5 mg/dL (None) (None) (None) 8.7 9.8  Phosphorus - (None) (None) (None) (None) (None)  Creatinine 0.5 - 1.1 mg/dL 0.7 0.5 (None) 0.52 0.58  AST 13 - 35 U/L 16 (None) (None) 19 22  Alk Phos 25 - 125 U/L 47 (None) (None) 52 61  Bilirubin 0.3 - 1.2 mg/dL (None) (None) (None) 1.0 0.9  Glucose - 96 108 (None) 136(H) 149(H)  Cholesterol - (None) (None) (None) (None) (None)  HDL cholesterol - (None) (None) (None) (None) (None)  Triglycerides - (None) (None) (None) (None) (None)  LDL Direct - (None) (None) (None) (None) (None)  LDL Calc - (None) (None) (None) (None) (None)  Total protein 6.0 - 8.3 g/dL (None) (None) (None) 6.2 7.6  Albumin 3.5 - 5.2 g/dL (None) (None) (None) 3.2(L) 3.7   Lab Results  Component Value Date   TSH 2.76 02/25/2014   Lab Results  Component Value Date   BUN 16 02/25/2014   Lab Results  Component Value Date   HGBA1C 6.0* 08/14/2013       Assessment/Plan  1. Fracture, vertebra, pathologic, sequela Changes are "up when necessary. Continue fentanyl patch.  2. Essential hypertension Controlled  3. Hyperglycemia Controlled  4. Hypothyroidism, unspecified hypothyroidism type Compensated -TSH, future  5. Memory deficit -MMSE, future  6. Acute cystitis without hematuria Resolved

## 2015-02-09 DIAGNOSIS — E1051 Type 1 diabetes mellitus with diabetic peripheral angiopathy without gangrene: Secondary | ICD-10-CM | POA: Diagnosis not present

## 2015-02-09 LAB — BASIC METABOLIC PANEL
BUN: 14 mg/dL (ref 4–21)
CREATININE: 0.6 mg/dL (ref 0.5–1.1)
GLUCOSE: 126 mg/dL
POTASSIUM: 4 mmol/L (ref 3.4–5.3)
Sodium: 138 mmol/L (ref 137–147)

## 2015-02-09 LAB — TSH: TSH: 4.72 u[IU]/mL (ref 0.41–5.90)

## 2015-02-10 ENCOUNTER — Other Ambulatory Visit: Payer: Self-pay | Admitting: Nurse Practitioner

## 2015-02-10 DIAGNOSIS — E039 Hypothyroidism, unspecified: Secondary | ICD-10-CM

## 2015-02-21 DIAGNOSIS — H353220 Exudative age-related macular degeneration, left eye, stage unspecified: Secondary | ICD-10-CM | POA: Diagnosis not present

## 2015-04-11 DIAGNOSIS — H353230 Exudative age-related macular degeneration, bilateral, stage unspecified: Secondary | ICD-10-CM | POA: Diagnosis not present

## 2015-06-09 ENCOUNTER — Non-Acute Institutional Stay: Payer: Medicare Other | Admitting: Nurse Practitioner

## 2015-06-09 ENCOUNTER — Encounter: Payer: Self-pay | Admitting: Nurse Practitioner

## 2015-06-09 DIAGNOSIS — S42409A Unspecified fracture of lower end of unspecified humerus, initial encounter for closed fracture: Secondary | ICD-10-CM | POA: Diagnosis not present

## 2015-06-09 DIAGNOSIS — G4700 Insomnia, unspecified: Secondary | ICD-10-CM

## 2015-06-09 DIAGNOSIS — K5900 Constipation, unspecified: Secondary | ICD-10-CM

## 2015-06-09 DIAGNOSIS — S066X0A Traumatic subarachnoid hemorrhage without loss of consciousness, initial encounter: Secondary | ICD-10-CM | POA: Diagnosis not present

## 2015-06-09 DIAGNOSIS — K219 Gastro-esophageal reflux disease without esophagitis: Secondary | ICD-10-CM

## 2015-06-09 DIAGNOSIS — I10 Essential (primary) hypertension: Secondary | ICD-10-CM | POA: Diagnosis not present

## 2015-06-09 DIAGNOSIS — E039 Hypothyroidism, unspecified: Secondary | ICD-10-CM

## 2015-06-09 DIAGNOSIS — M159 Polyosteoarthritis, unspecified: Secondary | ICD-10-CM | POA: Diagnosis not present

## 2015-06-09 DIAGNOSIS — H3530 Unspecified macular degeneration: Secondary | ICD-10-CM | POA: Diagnosis not present

## 2015-06-09 DIAGNOSIS — M810 Age-related osteoporosis without current pathological fracture: Secondary | ICD-10-CM | POA: Diagnosis not present

## 2015-06-09 DIAGNOSIS — W19XXXA Unspecified fall, initial encounter: Secondary | ICD-10-CM | POA: Diagnosis not present

## 2015-06-09 DIAGNOSIS — R2681 Unsteadiness on feet: Secondary | ICD-10-CM | POA: Diagnosis not present

## 2015-06-09 DIAGNOSIS — M545 Low back pain: Secondary | ICD-10-CM | POA: Diagnosis not present

## 2015-06-09 DIAGNOSIS — R413 Other amnesia: Secondary | ICD-10-CM | POA: Diagnosis not present

## 2015-06-09 DIAGNOSIS — H35059 Retinal neovascularization, unspecified, unspecified eye: Secondary | ICD-10-CM | POA: Diagnosis not present

## 2015-06-09 DIAGNOSIS — H9190 Unspecified hearing loss, unspecified ear: Secondary | ICD-10-CM | POA: Diagnosis not present

## 2015-06-09 DIAGNOSIS — R41841 Cognitive communication deficit: Secondary | ICD-10-CM | POA: Diagnosis not present

## 2015-06-09 DIAGNOSIS — M5440 Lumbago with sciatica, unspecified side: Secondary | ICD-10-CM

## 2015-06-09 DIAGNOSIS — M25579 Pain in unspecified ankle and joints of unspecified foot: Secondary | ICD-10-CM | POA: Diagnosis not present

## 2015-06-09 DIAGNOSIS — M6281 Muscle weakness (generalized): Secondary | ICD-10-CM | POA: Diagnosis not present

## 2015-06-09 NOTE — Assessment & Plan Note (Signed)
07/19/14 X-ray lumbar spine and pelvis: T11 subacute process with more than 70% height loss 07/26/14 managing pain with Norco bid.  Pain is reasonably controlled. Continue Norco bid.  

## 2015-06-09 NOTE — Assessment & Plan Note (Signed)
Stable, continue Trazodone 25mg  daily.

## 2015-06-09 NOTE — Assessment & Plan Note (Signed)
Still functioning adequately in AL  

## 2015-06-09 NOTE — Progress Notes (Signed)
Patient ID: Melissa Carroll, female   DOB: February 07, 1917, 82 y.o.   MRN: [id number]  Location:  [facility] Provider:  Marlana Latus NP  Code Status:  DNR Goals of care: Advanced Directive information Does patient have an advance directive?: Yes, Type of Advance Directive: Out of facility DNR (pink MOST or yellow form);Healthcare Power of Geographical information systems officer Complaint  Patient presents with  . Medical Management of Chronic Issues    Routine Visit     HPI: Patient is a 82 y.o. female seen in the AL at [facility] today for evaluation of GERD, memory, insomnia, back pain, thyroid, HTN  Review of Systems:  Review of Systems  Constitutional: Negative for fever, chills and diaphoresis.  HENT: Positive for hearing loss. Negative for congestion, ear discharge, ear pain and tinnitus.   Eyes: Negative for pain, discharge and redness.  Respiratory: Negative for cough, shortness of breath and wheezing.   Cardiovascular: Positive for leg swelling. Negative for chest pain and palpitations.       Trace in ankles.   Gastrointestinal: Negative for nausea, vomiting, abdominal pain, diarrhea and constipation.  Genitourinary: Positive for frequency. Negative for dysuria, urgency, hematuria and flank pain.  Musculoskeletal: Positive for back pain. Negative for myalgias and neck pain.       Lower back across R+L SIJ-better since Norco bid.   Skin: Negative for rash.  Neurological: Negative for dizziness, tremors, seizures, weakness and headaches.  Psychiatric/Behavioral: Positive for memory loss. Negative for suicidal ideas and hallucinations. The patient is not nervous/anxious.     Past Medical History  Diagnosis Date  . Arthritis   . Osteoarthritis   . Hypertension   . Cataracts, bilateral   . Closed fracture of unspecified part of lower end of humerus   . Retinal neovascularization NOS   . Macular degeneration (senile) of retina, unspecified   . Insomnia, unspecified   . Unspecified hypothyroidism     . Unspecified hearing loss   . Osteoarthrosis, unspecified whether generalized or localized, unspecified site   . Senile osteoporosis   . Urinary tract infection 08/10/2013  . Vertebral fracture 08/09/2013    T7 10%, T12 70%   . Subarachnoid hemorrhage following injury ([place]) 03/31/2012  . Pancreatitis, acute 08/14/2013    Patient Active Problem List   Diagnosis Date Noted  . Lower back pain 07/19/2014  . Memory deficit 01/07/2014  . GERD (gastroesophageal reflux disease) 09/14/2013  . Musculoskeletal pain 08/14/2013  . Hyponatremia 08/14/2013  . Hyperglycemia 08/14/2013  . Senile osteoporosis 08/10/2013  . Fracture, vertebra, pathologic 08/09/2013  . Constipation 09/11/2012  . Hypothyroidism 08/14/2012  . Osteoarthrosis, unspecified whether generalized or localized, unspecified site 08/14/2012  . Insomnia 08/14/2012  . Hypertension 04/02/2012  . Fall 03/31/2012  . Hard of hearing 03/31/2012  . Subarachnoid hemorrhage following injury ([place]) 03/31/2012    No Known Allergies  Medications: Patient's Medications  New Prescriptions   No medications on file  Previous Medications   ACETAMINOPHEN (TYLENOL) 500 MG TABLET    Take 500 mg by mouth every 6 (six) hours as needed. Pain   AMLODIPINE (NORVASC) 10 MG TABLET    Take 10 mg by mouth daily.   ASPIRIN EC 81 MG TABLET    Take 81 mg by mouth daily.   CHOLECALCIFEROL (VITAMIN D-3) 1000 UNITS CAPS    Take 1,000 Units by mouth every morning.   FAMOTIDINE (PEPCID) 20 MG TABLET    Take 20 mg by mouth at bedtime.   HYDROCODONE-ACETAMINOPHEN (NORCO/VICODIN)  5-325 MG PER TABLET    Take one tablet by mouth twice daily as needed for pain   LEVOTHYROXINE (SYNTHROID, LEVOTHROID) 50 MCG TABLET    Take 50 mcg by mouth daily before breakfast.   LORAZEPAM (ATIVAN) 1 MG TABLET    Take 1 tablet by mouth prior to showers as needed   LOSARTAN (COZAAR) 100 MG TABLET    Take 100 mg by mouth daily.   METOPROLOL TARTRATE (LOPRESSOR) 25 MG TABLET    Take  75 mg by mouth 2 (two) times daily.   MULTIPLE VITAMINS-MINERALS (CERTAVITE/ANTIOXIDANTS PO)    Take 1 tablet by mouth every morning.   MULTIPLE VITAMINS-MINERALS (PRESERVISION AREDS 2 PO)    Take 1 capsule by mouth 2 (two) times daily.   RALOXIFENE (EVISTA) 60 MG TABLET    Take 60 mg by mouth daily.   SENNA-DOCUSATE (SENOKOT-S) 8.6-50 MG PER TABLET    Take 2 tablets by mouth at bedtime.   TRAZODONE (DESYREL) 50 MG TABLET    Take 50 mg by mouth. Take 1/2 tablet at bedtime for sleep  Modified Medications   No medications on file  Discontinued Medications   FENTANYL (DURAGESIC - DOSED MCG/HR) 50 MCG/HR    Place 1 patch (50 mcg total) onto the skin every 3 (three) days.   OMEPRAZOLE (PRILOSEC) 20 MG CAPSULE    Take 20 mg by mouth daily.    Physical Exam: Filed Vitals:   06/09/15 1405  BP: 142/72  Pulse: 60  Temp: 97 F (36.1 C)  TempSrc: Oral  Resp: 18  Height: 5\' 1"  (1.549 m)  Weight: 153 lb 12.8 oz (69.763 kg)  SpO2: 94%   Body mass index is 29.08 kg/(m^2).  Physical Exam  Constitutional: She is oriented to person, place, and time. She appears well-developed and well-nourished.  HENT:  Head: Normocephalic and atraumatic.  Eyes: Conjunctivae and EOM are normal. Pupils are equal, round, and reactive to light.  Neck: Normal range of motion. Neck supple.  Cardiovascular:  No murmur heard. Pulmonary/Chest: Effort normal and breath sounds normal. No respiratory distress. She has no wheezes. She has no rales. She exhibits no tenderness.  Abdominal: Soft. Bowel sounds are normal. She exhibits no distension and no mass. There is no tenderness. There is no rebound and no guarding.  Musculoskeletal: Normal range of motion. She exhibits tenderness. She exhibits no edema.  Unsteady gait. Lower back across R+L SIJ-better managed with Norco bid   Neurological: She is alert and oriented to person, place, and time. She displays normal reflexes. No cranial nerve deficit. She exhibits normal  muscle tone. Coordination normal.  Skin: Skin is warm and dry. No rash noted. No erythema.  Psychiatric: She has a normal mood and affect. Her behavior is normal. Judgment and thought content normal. Her mood appears not anxious. Her affect is not angry, not labile and not inappropriate. Her speech is not rapid and/or pressured, not delayed and not slurred. She is not agitated, not aggressive, not slowed and not withdrawn. Thought content is not paranoid and not delusional. Cognition and memory are impaired. She does not express impulsivity or inappropriate judgment. Depressed: flat affect. She exhibits abnormal recent memory.    Labs reviewed: Basic Metabolic Panel:  Recent Labs  02/09/15  NA 138  K 4.0  BUN 14  CREATININE 0.6    Liver Function Tests: No results for input(s): AST, ALT, ALKPHOS, BILITOT, PROT, ALBUMIN in the last 8760 hours.  CBC: No results for input(s): WBC, NEUTROABS, HGB,  HCT, MCV, PLT in the last 8760 hours.  Lab Results  Component Value Date   TSH 4.72 02/09/2015   Lab Results  Component Value Date   HGBA1C 6.0* 08/14/2013   No results found for: CHOL, HDL, LDLCALC, LDLDIRECT, TRIG, CHOLHDL  Significant Diagnostic Results since last visit: positive urine culture for UTI  Patient Care Team: Estill Dooms, MD as PCP - General (Internal Medicine) Paiden Cavell X, NP as Nurse Practitioner (Nurse Practitioner) Charlotte Crumb, MD as Consulting Physician (Orthopedic Surgery) Wallene Huh, DPM as Consulting Physician (Podiatry) Gaynelle Arabian, MD as Consulting Physician (Orthopedic Surgery) Zebedee Iba, MD as Referring Physician (Ophthalmology) Newman Pies, MD as Consulting Physician (Neurosurgery) [facility] and [place])  Assessment/Plan Problem List Items Addressed This Visit    Memory deficit - Primary (Chronic)    Still functioning adequately in AL       Lower back pain    07/19/14 X-ray lumbar spine  and pelvis: T11 subacute process with more than 70% height loss 07/26/14 managing pain with Norco bid.  Pain is reasonably controlled. Continue Norco bid.       Insomnia    Stable, continue Trazodone 25mg  daily.       Hypothyroidism (Chronic)    Continue Levothyroxine 42mcg, lat TSH 2.76 02/25/14, 9/39/16 TSH 4.719      Hypertension (Chronic)    Controlled, continue Amlodipine 10mg , Losartan 100mg , Metoprolol 75mg  bid.       GERD (gastroesophageal reflux disease)    Stable, continue Omeprazole 20mg  daily      Relevant Medications   famotidine (PEPCID) 20 MG tablet   Constipation    Stable, continue Senokot S II nightly.           Family/ staff Communication: continue AL for care needs.   Labs/tests ordered: none   ManXie Nykole Matos NP [facility] [address], [place] [address] On Call:  [phone] & follow prompts after 5pm & weekends Office Phone:  [phone] Office Fax:  [phone]

## 2015-06-09 NOTE — Assessment & Plan Note (Signed)
Stable, continue Senokot S II nightly.  

## 2015-06-09 NOTE — Assessment & Plan Note (Signed)
Controlled, continue Amlodipine 10mg, Losartan 100mg, Metoprolol 75mg bid.  

## 2015-06-09 NOTE — Assessment & Plan Note (Signed)
Continue Levothyroxine 78mcg, lat TSH 2.76 02/25/14, 9/39/16 TSH 4.719

## 2015-06-09 NOTE — Assessment & Plan Note (Signed)
Stable, continue Omeprazole 20mg daily.  

## 2015-06-09 NOTE — Progress Notes (Signed)
This encounter was created in error - please disregard.  This encounter was created in error - please disregard.

## 2015-06-12 ENCOUNTER — Emergency Department (HOSPITAL_COMMUNITY)
Admission: EM | Admit: 2015-06-12 | Discharge: 2015-06-12 | Disposition: A | Discharge status: Home / Self Care | Payer: Medicare Other | Attending: Emergency Medicine | Admitting: Emergency Medicine

## 2015-06-12 ENCOUNTER — Emergency Department (HOSPITAL_COMMUNITY): Payer: Medicare Other

## 2015-06-12 ENCOUNTER — Encounter (HOSPITAL_COMMUNITY): Payer: Self-pay | Admitting: Emergency Medicine

## 2015-06-12 DIAGNOSIS — Z8781 Personal history of (healed) traumatic fracture: Secondary | ICD-10-CM | POA: Insufficient documentation

## 2015-06-12 DIAGNOSIS — H9190 Unspecified hearing loss, unspecified ear: Secondary | ICD-10-CM | POA: Insufficient documentation

## 2015-06-12 DIAGNOSIS — R2681 Unsteadiness on feet: Secondary | ICD-10-CM | POA: Diagnosis not present

## 2015-06-12 DIAGNOSIS — Z87440 Personal history of urinary (tract) infections: Secondary | ICD-10-CM | POA: Diagnosis not present

## 2015-06-12 DIAGNOSIS — Y9389 Activity, other specified: Secondary | ICD-10-CM | POA: Insufficient documentation

## 2015-06-12 DIAGNOSIS — Z79899 Other long term (current) drug therapy: Secondary | ICD-10-CM | POA: Diagnosis not present

## 2015-06-12 DIAGNOSIS — W01198A Fall on same level from slipping, tripping and stumbling with subsequent striking against other object, initial encounter: Secondary | ICD-10-CM | POA: Diagnosis not present

## 2015-06-12 DIAGNOSIS — M810 Age-related osteoporosis without current pathological fracture: Secondary | ICD-10-CM | POA: Insufficient documentation

## 2015-06-12 DIAGNOSIS — M6281 Muscle weakness (generalized): Secondary | ICD-10-CM | POA: Diagnosis not present

## 2015-06-12 DIAGNOSIS — S0003XA Contusion of scalp, initial encounter: Secondary | ICD-10-CM | POA: Diagnosis not present

## 2015-06-12 DIAGNOSIS — M1990 Unspecified osteoarthritis, unspecified site: Secondary | ICD-10-CM | POA: Diagnosis not present

## 2015-06-12 DIAGNOSIS — Y92480 Sidewalk as the place of occurrence of the external cause: Secondary | ICD-10-CM | POA: Diagnosis not present

## 2015-06-12 DIAGNOSIS — Y998 Other external cause status: Secondary | ICD-10-CM | POA: Diagnosis not present

## 2015-06-12 DIAGNOSIS — Z8719 Personal history of other diseases of the digestive system: Secondary | ICD-10-CM | POA: Insufficient documentation

## 2015-06-12 DIAGNOSIS — W19XXXA Unspecified fall, initial encounter: Secondary | ICD-10-CM

## 2015-06-12 DIAGNOSIS — I10 Essential (primary) hypertension: Secondary | ICD-10-CM | POA: Diagnosis not present

## 2015-06-12 DIAGNOSIS — S0990XA Unspecified injury of head, initial encounter: Secondary | ICD-10-CM | POA: Diagnosis present

## 2015-06-12 DIAGNOSIS — S066X0A Traumatic subarachnoid hemorrhage without loss of consciousness, initial encounter: Secondary | ICD-10-CM | POA: Diagnosis not present

## 2015-06-12 DIAGNOSIS — M545 Low back pain: Secondary | ICD-10-CM | POA: Diagnosis not present

## 2015-06-12 DIAGNOSIS — R41841 Cognitive communication deficit: Secondary | ICD-10-CM | POA: Diagnosis not present

## 2015-06-12 DIAGNOSIS — E039 Hypothyroidism, unspecified: Secondary | ICD-10-CM | POA: Insufficient documentation

## 2015-06-12 DIAGNOSIS — Z7982 Long term (current) use of aspirin: Secondary | ICD-10-CM | POA: Diagnosis not present

## 2015-06-12 MED ORDER — TRAMADOL HCL 50 MG PO TABS: 50.0000 mg | ORAL_TABLET | Freq: Three times a day (TID) | ORAL | Status: DC | PRN

## 2015-06-12 MED ORDER — DIAZEPAM 5 MG PO TABS
2.5000 mg | ORAL_TABLET | Freq: Once | ORAL | Status: AC
  Administered 2015-06-12: 2.5 mg via ORAL
  Filled 2015-06-12: qty 1

## 2015-06-12 MED ORDER — HYDROMORPHONE HCL 1 MG/ML IJ SOLN
0.5000 mg | Freq: Once | INTRAMUSCULAR | Status: AC
  Administered 2015-06-12: 0.5 mg via INTRAMUSCULAR
  Filled 2015-06-12: qty 1

## 2015-06-12 MED ORDER — KETOROLAC TROMETHAMINE 15 MG/ML IJ SOLN
15.0000 mg | Freq: Once | INTRAMUSCULAR | Status: AC
  Administered 2015-06-12: 15 mg via INTRAMUSCULAR
  Filled 2015-06-12: qty 1

## 2015-06-12 NOTE — ED Notes (Signed)
Patient ambulated with walker as she normally uses with a steady gait. No complications.

## 2015-06-12 NOTE — Progress Notes (Signed)
CSW met with patient at bedside. Patient was hard of hearing. Daughter was present.  Daughter confirms patient presents to [facility] due to falling around lunch time while out at a restaurant. Daughter states patient has fallen maybe x2 within the past 6 months but did not hurt herself during those times. Daughter states patient receives assistance with bathing and sometimes dressing while at facility from staff members.  Daughter states patient is from [facility], and has been a resident at the facility since 2013. Daughter states she is POA for patient.  Patient and daughter state that they do not have any questions at this time.  Daughter/[name] Castrogiovanni [phone]  Willette Brace [phone] ED CSW 06/12/2015 7:51 PM

## 2015-06-12 NOTE — ED Notes (Signed)
Pt was walking out of restaurant with walker and fell backwards over curb and hit her head on the pavement. Pt sts she just lost her balance. Denies dizziness, lightheadedness. A&Ox4. Pt family denies LOC. Pt c/o slight pain where she hit the back of her head. Pt has large hematoma to back of head. Denies N/V, blurry vision, double vision.

## 2015-06-12 NOTE — ED Provider Notes (Signed)
CSN: [id number]     Arrival date & time 06/12/15  1433 History   First MD Initiated Contact with Patient 06/12/15 1811     Chief Complaint  Patient presents with  . Fall  . Head Injury     (Consider location/radiation/quality/duration/timing/severity/associated sxs/prior Treatment) HPI   82 year old female presenting with her daughter for evaluation after fall. Happened just before arrival. Patient was standing with her walker about to get into the car when she lost her balance and fell backwards. She did strike her head. No loss of consciousness. She was able to get up afterwards ambulate with assistance of a walker which is her baseline. She's complaining of some pain in her posterior scalp. Denies any acute pain elsewhere. Baseline mental status per her daughter. On aspirin.    Past Medical History  Diagnosis Date  . Arthritis   . Osteoarthritis   . Hypertension   . Cataracts, bilateral   . Closed fracture of unspecified part of lower end of humerus   . Retinal neovascularization NOS   . Macular degeneration (senile) of retina, unspecified   . Insomnia, unspecified   . Unspecified hypothyroidism   . Unspecified hearing loss   . Osteoarthrosis, unspecified whether generalized or localized, unspecified site   . Senile osteoporosis   . Urinary tract infection 08/10/2013  . Vertebral fracture 08/09/2013    T7 10%, T12 70%   . Subarachnoid hemorrhage following injury ([place]) 03/31/2012  . Pancreatitis, acute 08/14/2013   Past Surgical History  Procedure Laterality Date  . Ankle fracture surgery  1960  . Abdominal hysterectomy  1942  . Closed reduction with humeral pin insertion  04/01/2012    Procedure: CLOSED REDUCTION WITH HUMERAL PIN INSERTION;  Surgeon: Schuyler Amor, MD;  Location: [facility];  Service: Orthopedics;  Laterality: Left;  . Bladder suspension     Family History  Problem Relation Age of Onset  . Hypertension Mother    Social History  Substance Use Topics  .  Smoking status: Never Smoker   . Smokeless tobacco: Never Used  . Alcohol Use: No   OB History    No data available     Review of Systems  All systems reviewed and negative, other than as noted in HPI.   Allergies  Review of patient's allergies indicates no known allergies.  Home Medications   Prior to Admission medications   Medication Sig Start Date End Date Taking? Authorizing Provider  acetaminophen (TYLENOL) 500 MG tablet Take 500 mg by mouth every 6 (six) hours as needed. Pain   Yes Historical Provider, MD  amLODipine (NORVASC) 10 MG tablet Take 10 mg by mouth daily.   Yes Historical Provider, MD  aspirin EC 81 MG tablet Take 81 mg by mouth daily.   Yes Historical Provider, MD  Cholecalciferol (VITAMIN D-3) 1000 UNITS CAPS Take 1,000 Units by mouth every morning.   Yes Historical Provider, MD  famotidine (PEPCID) 20 MG tablet Take 20 mg by mouth at bedtime.   Yes Historical Provider, MD  HYDROcodone-acetaminophen (NORCO/VICODIN) 5-325 MG per tablet Take one tablet by mouth twice daily as needed for pain 02/07/15  Yes Estill Dooms, MD  levothyroxine (SYNTHROID, LEVOTHROID) 50 MCG tablet Take 50 mcg by mouth daily before breakfast.   Yes Historical Provider, MD  LORazepam (ATIVAN) 1 MG tablet Take 1 tablet by mouth prior to showers as needed   Yes Historical Provider, MD  losartan (COZAAR) 100 MG tablet Take 100 mg by mouth daily.  Yes Historical Provider, MD  metoprolol tartrate (LOPRESSOR) 25 MG tablet Take 75 mg by mouth 2 (two) times daily.   Yes Historical Provider, MD  Multiple Vitamins-Minerals (CERTAVITE/ANTIOXIDANTS PO) Take 1 tablet by mouth every morning.   Yes Historical Provider, MD  Multiple Vitamins-Minerals (PRESERVISION AREDS 2 PO) Take 1 capsule by mouth 2 (two) times daily.   Yes Historical Provider, MD  raloxifene (EVISTA) 60 MG tablet Take 60 mg by mouth daily.   Yes Historical Provider, MD  senna-docusate (SENOKOT-S) 8.6-50 MG per tablet Take 2 tablets by  mouth at bedtime.   Yes Historical Provider, MD  traZODone (DESYREL) 50 MG tablet Take 25 mg by mouth at bedtime.    Yes Historical Provider, MD  traMADol (ULTRAM) 50 MG tablet Take 1 tablet (50 mg total) by mouth 3 (three) times daily as needed. 06/12/15   Virgel Manifold, MD   BP 158/83 mmHg  Pulse 65  Temp(Src) 99.1 F (37.3 C) (Oral)  Resp 16  SpO2 95% Physical Exam  Constitutional: She appears well-developed and well-nourished. No distress.  HENT:  Head: Normocephalic.  Posterior scalp hematoma. No breaks in skin. No midline spinal tenderness.  Eyes: Conjunctivae and EOM are normal. Pupils are equal, round, and reactive to light. Right eye exhibits no discharge. Left eye exhibits no discharge.  Neck: Neck supple.  Cardiovascular: Normal rate, regular rhythm and normal heart sounds.  Exam reveals no gallop and no friction rub.   No murmur heard. Pulmonary/Chest: Effort normal and breath sounds normal. No respiratory distress.  Abdominal: Soft. She exhibits no distension. There is no tenderness.  Musculoskeletal: She exhibits no edema or tenderness.  No bony tenderness of the extremities. No apparent pain with range of motion of the large joints.  Neurological: She is alert. No cranial nerve deficit. She exhibits normal muscle tone. Coordination normal.  Hard of hearing, otherwise cranial nerves II through XII are intact. Strength is 5 out of 5 bilateral upper lower extremities.  Skin: Skin is warm and dry.  Psychiatric: She has a normal mood and affect. Her behavior is normal. Thought content normal.  Nursing note and vitals reviewed.   ED Course  Procedures (including critical care time) Labs Review Labs Reviewed - No data to display  Imaging Review Ct Head Wo Contrast  06/12/2015  CLINICAL DATA:  Head injury.  Dizziness. EXAM: CT HEAD WITHOUT CONTRAST TECHNIQUE: Contiguous axial images were obtained from the base of the skull through the vertex without intravenous contrast.  COMPARISON:  09/11/2013 FINDINGS: Sinuses/Soft tissues: Midline occipital scalp soft tissue swelling is mild to moderate. No underlying skull fracture. Clear paranasal sinuses and mastoid air cells. Intracranial: Moderate low density in the periventricular white matter likely related to small vessel disease. Cerebral and cerebellar volume loss are similar and expected for age. No mass lesion, hemorrhage, hydrocephalus, acute infarct, intra-axial, or extra-axial fluid collection. IMPRESSION: 1. Posterior scalp soft tissue swelling, without acute intracranial abnormality. 2.  Cerebral atrophy and small vessel ischemic change. Electronically Signed   By: Abigail Miyamoto M.D.   On: 06/12/2015 16:49   I have personally reviewed and evaluated these images and lab results as part of my medical decision-making.   EKG Interpretation None      MDM   Final diagnoses:  Fall, initial encounter  Scalp hematoma, initial encounter    82 year old female with mechanical fall. Struck the back of her head. No loss of consciousness. At bedside. She reports no change in mental status. Her neuro exam is  nonfocal aside from being hard of hearing. Imaging without acute abnormality aside from scalp hematoma. She has no midline spinal tenderness. No suspicion for serious traumatic injury. She is hemodynamically stable. Feel she is appropriate for discharge at this time. Return precautions discussed with patient and her daughter. As needed pain medication.    Virgel Manifold, MD 06/12/15 [phone]

## 2015-06-12 NOTE — ED Notes (Signed)
Float nurse.

## 2015-06-12 NOTE — Discharge Instructions (Signed)
Head Injury, Adult °You have a head injury. Headaches and throwing up (vomiting) are common after a head injury. It should be easy to wake up from sleeping. Sometimes you must stay in the hospital. Most problems happen within the first 24 hours. Side effects may occur up to 7-10 days after the injury.  °WHAT ARE THE TYPES OF HEAD INJURIES? °Head injuries can be as minor as a bump. Some head injuries can be more severe. More severe head injuries include: °· A jarring injury to the brain (concussion). °· A bruise of the brain (contusion). This mean there is bleeding in the brain that can cause swelling. °· A cracked skull (skull fracture). °· Bleeding in the brain that collects, clots, and forms a bump (hematoma). °WHEN SHOULD I GET HELP RIGHT AWAY?  °· You are confused or sleepy. °· You cannot be woken up. °· You feel sick to your stomach (nauseous) or keep throwing up (vomiting). °· Your dizziness or unsteadiness is getting worse. °· You have very bad, lasting headaches that are not helped by medicine. Take medicines only as told by your doctor. °· You cannot use your arms or legs like normal. °· You cannot walk. °· You notice changes in the black spots in the center of the colored part of your eye (pupil). °· You have clear or bloody fluid coming from your nose or ears. °· You have trouble seeing. °During the next 24 hours after the injury, you must stay with someone who can watch you. This person should get help right away (call 911 in the [place] if you start to shake and are not able to control it (have seizures), you pass out, or you are unable to wake up. °HOW CAN I PREVENT A HEAD INJURY IN THE FUTURE? °· Wear seat belts. °· Wear a helmet while bike riding and playing sports like football. °· Stay away from dangerous activities around the house. °WHEN CAN I RETURN TO NORMAL ACTIVITIES AND ATHLETICS? °See your doctor before doing these activities. You should not do normal activities or play contact sports until 1  week after the following symptoms have stopped: °· Headache that does not go away. °· Dizziness. °· Poor attention. °· Confusion. °· Memory problems. °· Sickness to your stomach or throwing up. °· Tiredness. °· Fussiness. °· Bothered by bright lights or loud noises. °· Anxiousness or depression. °· Restless sleep. °MAKE SURE YOU:  °· Understand these instructions. °· Will watch your condition. °· Will get help right away if you are not doing well or get worse. °  °This information is not intended to replace advice given to you by your health care provider. Make sure you discuss any questions you have with your health care provider. °  °Document Released: 04/11/2008 Document Revised: 05/20/2014 Document Reviewed: 01/04/2013 °Elsevier Interactive Patient Education ©2016 [employer] ° °

## 2015-06-12 NOTE — ED Notes (Signed)
MD at bedside. 

## 2015-06-14 DIAGNOSIS — H9190 Unspecified hearing loss, unspecified ear: Secondary | ICD-10-CM | POA: Diagnosis not present

## 2015-06-14 DIAGNOSIS — M159 Polyosteoarthritis, unspecified: Secondary | ICD-10-CM | POA: Diagnosis not present

## 2015-06-14 DIAGNOSIS — H35059 Retinal neovascularization, unspecified, unspecified eye: Secondary | ICD-10-CM | POA: Diagnosis not present

## 2015-06-14 DIAGNOSIS — G4700 Insomnia, unspecified: Secondary | ICD-10-CM | POA: Diagnosis not present

## 2015-06-14 DIAGNOSIS — H3530 Unspecified macular degeneration: Secondary | ICD-10-CM | POA: Diagnosis not present

## 2015-06-14 DIAGNOSIS — W19XXXA Unspecified fall, initial encounter: Secondary | ICD-10-CM | POA: Diagnosis not present

## 2015-06-14 DIAGNOSIS — R2681 Unsteadiness on feet: Secondary | ICD-10-CM | POA: Diagnosis not present

## 2015-06-14 DIAGNOSIS — S066X0A Traumatic subarachnoid hemorrhage without loss of consciousness, initial encounter: Secondary | ICD-10-CM | POA: Diagnosis not present

## 2015-06-14 DIAGNOSIS — S42409A Unspecified fracture of lower end of unspecified humerus, initial encounter for closed fracture: Secondary | ICD-10-CM | POA: Diagnosis not present

## 2015-06-14 DIAGNOSIS — M6281 Muscle weakness (generalized): Secondary | ICD-10-CM | POA: Diagnosis not present

## 2015-06-14 DIAGNOSIS — M545 Low back pain: Secondary | ICD-10-CM | POA: Diagnosis not present

## 2015-06-14 DIAGNOSIS — M25579 Pain in unspecified ankle and joints of unspecified foot: Secondary | ICD-10-CM | POA: Diagnosis not present

## 2015-06-14 DIAGNOSIS — M810 Age-related osteoporosis without current pathological fracture: Secondary | ICD-10-CM | POA: Diagnosis not present

## 2015-06-14 DIAGNOSIS — I10 Essential (primary) hypertension: Secondary | ICD-10-CM | POA: Diagnosis not present

## 2015-06-14 DIAGNOSIS — R41841 Cognitive communication deficit: Secondary | ICD-10-CM | POA: Diagnosis not present

## 2015-06-16 ENCOUNTER — Non-Acute Institutional Stay: Payer: Medicare Other | Admitting: Nurse Practitioner

## 2015-06-16 ENCOUNTER — Encounter: Payer: Self-pay | Admitting: Nurse Practitioner

## 2015-06-16 DIAGNOSIS — M5440 Lumbago with sciatica, unspecified side: Secondary | ICD-10-CM | POA: Diagnosis not present

## 2015-06-16 DIAGNOSIS — R413 Other amnesia: Secondary | ICD-10-CM

## 2015-06-16 DIAGNOSIS — E039 Hypothyroidism, unspecified: Secondary | ICD-10-CM

## 2015-06-16 DIAGNOSIS — K219 Gastro-esophageal reflux disease without esophagitis: Secondary | ICD-10-CM | POA: Diagnosis not present

## 2015-06-16 DIAGNOSIS — K5900 Constipation, unspecified: Secondary | ICD-10-CM | POA: Diagnosis not present

## 2015-06-16 DIAGNOSIS — G4700 Insomnia, unspecified: Secondary | ICD-10-CM

## 2015-06-16 DIAGNOSIS — I10 Essential (primary) hypertension: Secondary | ICD-10-CM | POA: Diagnosis not present

## 2015-06-16 NOTE — Progress Notes (Signed)
Patient ID: Melissa Carroll, female   DOB: 11-Sep-1916, 82 y.o.   MRN: [id number]

## 2015-06-20 DIAGNOSIS — M6281 Muscle weakness (generalized): Secondary | ICD-10-CM | POA: Diagnosis not present

## 2015-06-20 DIAGNOSIS — R2681 Unsteadiness on feet: Secondary | ICD-10-CM | POA: Diagnosis not present

## 2015-06-20 DIAGNOSIS — I10 Essential (primary) hypertension: Secondary | ICD-10-CM | POA: Diagnosis not present

## 2015-06-20 DIAGNOSIS — R41841 Cognitive communication deficit: Secondary | ICD-10-CM | POA: Diagnosis not present

## 2015-06-20 DIAGNOSIS — S066X0A Traumatic subarachnoid hemorrhage without loss of consciousness, initial encounter: Secondary | ICD-10-CM | POA: Diagnosis not present

## 2015-06-20 DIAGNOSIS — M545 Low back pain: Secondary | ICD-10-CM | POA: Diagnosis not present

## 2015-06-21 ENCOUNTER — Encounter: Payer: Self-pay | Admitting: Nurse Practitioner

## 2015-06-21 NOTE — Assessment & Plan Note (Signed)
Stable, continue Trazodone 25mg  daily.

## 2015-06-21 NOTE — Progress Notes (Signed)
Patient ID: Melissa Carroll, female   DOB: [birth year], 82 y.o.   MRN: [id number]  [facility] Provider:  Marlana Latus NP  Code Status:  DNR Goals of care: Advanced Directive information Type of Advance Directive: Healthcare Power of [place];Out of facility DNR (pink MOST or yellow form)  Chief Complaint  Patient presents with  . Medical Management of Chronic Issues  . Acute Visit  . Hospitalization Follow-up     HPI: Patient is a 82 y.o. female seen in the [facility] at [facility] today for evaluation of GERD, memory, insomnia, back pain, thyroid, HTN   ED eval for head injury 06/12/15, fell and struck head, posterior scalp swelling, CT head 1. Posterior scalp soft tissue swelling, without acute intracranial Abnormality. 2. Cerebral atrophy and small vessel ischemic change.   Review of Systems:  Review of Systems  Constitutional: Negative for fever, chills and diaphoresis.  HENT: Positive for hearing loss. Negative for congestion, ear discharge, ear pain and tinnitus.   Eyes: Negative for pain, discharge and redness.  Respiratory: Negative for cough, shortness of breath and wheezing.   Cardiovascular: Positive for leg swelling. Negative for chest pain and palpitations.       Trace in ankles.   Gastrointestinal: Negative for nausea, vomiting, abdominal pain, diarrhea and constipation.  Genitourinary: Positive for frequency. Negative for dysuria, urgency, hematuria and flank pain.  Musculoskeletal: Positive for back pain. Negative for myalgias and neck pain.       Lower back across R+L SIJ-better since Norco bid.   Skin: Negative for rash.  Neurological: Negative for dizziness, tremors, seizures, weakness and headaches.  Psychiatric/Behavioral: Positive for memory loss. Negative for suicidal ideas and hallucinations. The patient is not nervous/anxious.     Past Medical History  Diagnosis Date  . Arthritis   . Osteoarthritis   . Hypertension   . Cataracts, bilateral   .  Closed fracture of unspecified part of lower end of humerus   . Retinal neovascularization NOS   . Macular degeneration (senile) of retina, unspecified   . Insomnia, unspecified   . Unspecified hypothyroidism   . Unspecified hearing loss   . Osteoarthrosis, unspecified whether generalized or localized, unspecified site   . Senile osteoporosis   . Urinary tract infection 08/10/2013  . Vertebral fracture 08/09/2013    T7 10%, T12 70%   . Subarachnoid hemorrhage following injury ([place]) 03/31/2012  . Pancreatitis, acute 08/14/2013    Patient Active Problem List   Diagnosis Date Noted  . Lower back pain 07/19/2014  . Memory deficit 01/07/2014  . GERD (gastroesophageal reflux disease) 09/14/2013  . Musculoskeletal pain 08/14/2013  . Hyponatremia 08/14/2013  . Hyperglycemia 08/14/2013  . Senile osteoporosis 08/10/2013  . Fracture, vertebra, pathologic 08/09/2013  . Constipation 09/11/2012  . Hypothyroidism 08/14/2012  . Osteoarthrosis, unspecified whether generalized or localized, unspecified site 08/14/2012  . Insomnia 08/14/2012  . Hypertension 04/02/2012  . Fall 03/31/2012  . Hard of hearing 03/31/2012  . Subarachnoid hemorrhage following injury ([place]) 03/31/2012    No Known Allergies  Medications: Patient's Medications  New Prescriptions   No medications on file  Previous Medications   ACETAMINOPHEN (TYLENOL) 500 MG TABLET    Take 500 mg by mouth every 6 (six) hours as needed. Pain   AMLODIPINE (NORVASC) 10 MG TABLET    Take 10 mg by mouth daily.   ASPIRIN EC 81 MG TABLET    Take 81 mg by mouth daily.   CHOLECALCIFEROL (VITAMIN D-3) 1000 UNITS CAPS  Take 1,000 Units by mouth every morning.   FAMOTIDINE (PEPCID) 20 MG TABLET    Take 20 mg by mouth at bedtime.   HYDROCODONE-ACETAMINOPHEN (NORCO/VICODIN) 5-325 MG PER TABLET    Take one tablet by mouth twice daily as needed for pain   LEVOTHYROXINE (SYNTHROID, LEVOTHROID) 50 MCG TABLET    Take 50 mcg by mouth daily before  breakfast.   LORAZEPAM (ATIVAN) 1 MG TABLET    Take 1 tablet by mouth prior to showers as needed   LOSARTAN (COZAAR) 100 MG TABLET    Take 100 mg by mouth daily.   METOPROLOL TARTRATE (LOPRESSOR) 25 MG TABLET    Take 75 mg by mouth 2 (two) times daily.   MULTIPLE VITAMINS-MINERALS (CERTAVITE/ANTIOXIDANTS PO)    Take 1 tablet by mouth every morning.   MULTIPLE VITAMINS-MINERALS (PRESERVISION AREDS 2 PO)    Take 1 capsule by mouth 2 (two) times daily.   RALOXIFENE (EVISTA) 60 MG TABLET    Take 60 mg by mouth daily.   SENNA-DOCUSATE (SENOKOT-S) 8.6-50 MG PER TABLET    Take 2 tablets by mouth at bedtime.   TRAZODONE (DESYREL) 50 MG TABLET    Take 25 mg by mouth at bedtime.   Modified Medications   No medications on file  Discontinued Medications   TRAMADOL (ULTRAM) 50 MG TABLET    Take 1 tablet (50 mg total) by mouth 3 (three) times daily as needed.    Physical Exam: Filed Vitals:   06/16/15 1531  BP: 160/84  Pulse: 64  Temp: 96.2 F (35.7 C)  TempSrc: Oral  Resp: 20   There is no weight on file to calculate BMI.  Physical Exam  Constitutional: She is oriented to person, place, and time. She appears well-developed and well-nourished.  HENT:  Head: Normocephalic and atraumatic.  Eyes: Conjunctivae and EOM are normal. Pupils are equal, round, and reactive to light.  Neck: Normal range of motion. Neck supple.  Cardiovascular:  No murmur heard. Pulmonary/Chest: Effort normal and breath sounds normal. No respiratory distress. She has no wheezes. She has no rales. She exhibits no tenderness.  Abdominal: Soft. Bowel sounds are normal. She exhibits no distension and no mass. There is no tenderness. There is no rebound and no guarding.  Musculoskeletal: Normal range of motion. She exhibits tenderness. She exhibits no edema.  Unsteady gait. Lower back across R+L SIJ-better managed with Norco bid   Neurological: She is alert and oriented to person, place, and time. She displays normal  reflexes. No cranial nerve deficit. She exhibits normal muscle tone. Coordination normal.  Skin: Skin is warm and dry. No rash noted. No erythema.  Psychiatric: She has a normal mood and affect. Her behavior is normal. Judgment and thought content normal. Her mood appears not anxious. Her affect is not angry, not labile and not inappropriate. Her speech is not rapid and/or pressured, not delayed and not slurred. She is not agitated, not aggressive, not slowed and not withdrawn. Thought content is not paranoid and not delusional. Cognition and memory are impaired. She does not express impulsivity or inappropriate judgment. Depressed: flat affect. She exhibits abnormal recent memory.    Labs reviewed: Basic Metabolic Panel:  Recent Labs  02/09/15  NA 138  K 4.0  BUN 14  CREATININE 0.6    Liver Function Tests: No results for input(s): AST, ALT, ALKPHOS, BILITOT, PROT, ALBUMIN in the last 8760 hours.  CBC: No results for input(s): WBC, NEUTROABS, HGB, HCT, MCV, PLT in the last 8760  hours.  Lab Results  Component Value Date   TSH 4.72 02/09/2015   Lab Results  Component Value Date   HGBA1C 6.0* 08/14/2013   No results found for: CHOL, HDL, LDLCALC, LDLDIRECT, TRIG, CHOLHDL  Significant Diagnostic Results since last visit: positive urine culture for UTI  Patient Care Team: Estill Dooms, MD as PCP - General (Internal Medicine) Nehemiah Mcfarren X, NP as Nurse Practitioner (Nurse Practitioner) Charlotte Crumb, MD as Consulting Physician (Orthopedic Surgery) Wallene Huh, DPM as Consulting Physician (Podiatry) Gaynelle Arabian, MD as Consulting Physician (Orthopedic Surgery) Zebedee Iba, MD as Referring Physician (Ophthalmology) Newman Pies, MD as Consulting Physician (Neurosurgery) [place] [facility]Sk[facility] and [place])  Assessment/Plan Problem List Items Addressed This Visit    Hypertension - Primary (Chronic)    Controlled, continue Amlodipine 10mg ,  Losartan 100mg , Metoprolol 75mg  bid      Hypothyroidism (Chronic)    Continue Levothyroxine 6mcg, lat TSH 2.76 02/25/14, 02/10/15 TSH 4.719      Memory deficit (Chronic)    Still functioning adequately in AL       Insomnia    Stable, continue Trazodone 25mg  daily.       Constipation    Stable, continue Senokot S II nightly.       GERD (gastroesophageal reflux disease)    Stable, continue Omeprazole 20mg  daily      Lower back pain    07/19/14 X-ray lumbar spine and pelvis: T11 subacute process with more than 70% height loss 07/26/14 managing pain with Norco bid.  Pain is reasonably controlled. Continue Norco bid prn, prn Tramadol.           Family/ staff Communication: continue AL for care needs.   Labs/tests ordered: none   ManXie Latrise Bowland NP [facility] [address], [place] [address] On Call:  [phone] & follow prompts after 5pm & weekends Office Phone:  [phone] Office Fax:  [phone]

## 2015-06-21 NOTE — Assessment & Plan Note (Signed)
Stable, continue Senokot S II nightly.  

## 2015-06-21 NOTE — Assessment & Plan Note (Signed)
Stable, continue Omeprazole 20mg daily.  

## 2015-06-21 NOTE — Assessment & Plan Note (Signed)
Controlled, continue Amlodipine 10mg, Losartan 100mg, Metoprolol 75mg bid.  

## 2015-06-21 NOTE — Assessment & Plan Note (Signed)
Still functioning adequately in AL  

## 2015-06-21 NOTE — Assessment & Plan Note (Signed)
Continue Levothyroxine 21mcg, lat TSH 2.76 02/25/14, 02/10/15 TSH 4.719

## 2015-06-21 NOTE — Assessment & Plan Note (Signed)
07/19/14 X-ray lumbar spine and pelvis: T11 subacute process with more than 70% height loss 07/26/14 managing pain with Norco bid.  Pain is reasonably controlled. Continue Norco bid prn, prn Tramadol.

## 2015-06-23 DIAGNOSIS — S066X0A Traumatic subarachnoid hemorrhage without loss of consciousness, initial encounter: Secondary | ICD-10-CM | POA: Diagnosis not present

## 2015-06-23 DIAGNOSIS — R2681 Unsteadiness on feet: Secondary | ICD-10-CM | POA: Diagnosis not present

## 2015-06-23 DIAGNOSIS — M6281 Muscle weakness (generalized): Secondary | ICD-10-CM | POA: Diagnosis not present

## 2015-06-23 DIAGNOSIS — M545 Low back pain: Secondary | ICD-10-CM | POA: Diagnosis not present

## 2015-06-23 DIAGNOSIS — I10 Essential (primary) hypertension: Secondary | ICD-10-CM | POA: Diagnosis not present

## 2015-06-23 DIAGNOSIS — R41841 Cognitive communication deficit: Secondary | ICD-10-CM | POA: Diagnosis not present

## 2015-06-28 DIAGNOSIS — R41841 Cognitive communication deficit: Secondary | ICD-10-CM | POA: Diagnosis not present

## 2015-06-28 DIAGNOSIS — S066X0A Traumatic subarachnoid hemorrhage without loss of consciousness, initial encounter: Secondary | ICD-10-CM | POA: Diagnosis not present

## 2015-06-28 DIAGNOSIS — I10 Essential (primary) hypertension: Secondary | ICD-10-CM | POA: Diagnosis not present

## 2015-06-28 DIAGNOSIS — M6281 Muscle weakness (generalized): Secondary | ICD-10-CM | POA: Diagnosis not present

## 2015-06-28 DIAGNOSIS — R2681 Unsteadiness on feet: Secondary | ICD-10-CM | POA: Diagnosis not present

## 2015-06-28 DIAGNOSIS — M545 Low back pain: Secondary | ICD-10-CM | POA: Diagnosis not present

## 2015-07-03 DIAGNOSIS — M6281 Muscle weakness (generalized): Secondary | ICD-10-CM | POA: Diagnosis not present

## 2015-07-03 DIAGNOSIS — R2681 Unsteadiness on feet: Secondary | ICD-10-CM | POA: Diagnosis not present

## 2015-07-03 DIAGNOSIS — S066X0A Traumatic subarachnoid hemorrhage without loss of consciousness, initial encounter: Secondary | ICD-10-CM | POA: Diagnosis not present

## 2015-07-03 DIAGNOSIS — R41841 Cognitive communication deficit: Secondary | ICD-10-CM | POA: Diagnosis not present

## 2015-07-03 DIAGNOSIS — M545 Low back pain: Secondary | ICD-10-CM | POA: Diagnosis not present

## 2015-07-03 DIAGNOSIS — I10 Essential (primary) hypertension: Secondary | ICD-10-CM | POA: Diagnosis not present

## 2015-07-04 DIAGNOSIS — R2681 Unsteadiness on feet: Secondary | ICD-10-CM | POA: Diagnosis not present

## 2015-07-04 DIAGNOSIS — M545 Low back pain: Secondary | ICD-10-CM | POA: Diagnosis not present

## 2015-07-04 DIAGNOSIS — S066X0A Traumatic subarachnoid hemorrhage without loss of consciousness, initial encounter: Secondary | ICD-10-CM | POA: Diagnosis not present

## 2015-07-04 DIAGNOSIS — M6281 Muscle weakness (generalized): Secondary | ICD-10-CM | POA: Diagnosis not present

## 2015-07-04 DIAGNOSIS — I10 Essential (primary) hypertension: Secondary | ICD-10-CM | POA: Diagnosis not present

## 2015-07-04 DIAGNOSIS — R41841 Cognitive communication deficit: Secondary | ICD-10-CM | POA: Diagnosis not present

## 2015-07-06 DIAGNOSIS — R41841 Cognitive communication deficit: Secondary | ICD-10-CM | POA: Diagnosis not present

## 2015-07-06 DIAGNOSIS — I10 Essential (primary) hypertension: Secondary | ICD-10-CM | POA: Diagnosis not present

## 2015-07-06 DIAGNOSIS — M6281 Muscle weakness (generalized): Secondary | ICD-10-CM | POA: Diagnosis not present

## 2015-07-06 DIAGNOSIS — R2681 Unsteadiness on feet: Secondary | ICD-10-CM | POA: Diagnosis not present

## 2015-07-06 DIAGNOSIS — S066X0A Traumatic subarachnoid hemorrhage without loss of consciousness, initial encounter: Secondary | ICD-10-CM | POA: Diagnosis not present

## 2015-07-06 DIAGNOSIS — M545 Low back pain: Secondary | ICD-10-CM | POA: Diagnosis not present

## 2015-07-10 DIAGNOSIS — M545 Low back pain: Secondary | ICD-10-CM | POA: Diagnosis not present

## 2015-07-10 DIAGNOSIS — R2681 Unsteadiness on feet: Secondary | ICD-10-CM | POA: Diagnosis not present

## 2015-07-10 DIAGNOSIS — M6281 Muscle weakness (generalized): Secondary | ICD-10-CM | POA: Diagnosis not present

## 2015-07-10 DIAGNOSIS — R41841 Cognitive communication deficit: Secondary | ICD-10-CM | POA: Diagnosis not present

## 2015-07-10 DIAGNOSIS — S066X0A Traumatic subarachnoid hemorrhage without loss of consciousness, initial encounter: Secondary | ICD-10-CM | POA: Diagnosis not present

## 2015-07-10 DIAGNOSIS — I10 Essential (primary) hypertension: Secondary | ICD-10-CM | POA: Diagnosis not present

## 2015-07-14 DIAGNOSIS — M159 Polyosteoarthritis, unspecified: Secondary | ICD-10-CM | POA: Diagnosis not present

## 2015-07-14 DIAGNOSIS — W19XXXA Unspecified fall, initial encounter: Secondary | ICD-10-CM | POA: Diagnosis not present

## 2015-07-14 DIAGNOSIS — R2681 Unsteadiness on feet: Secondary | ICD-10-CM | POA: Diagnosis not present

## 2015-07-14 DIAGNOSIS — S42409A Unspecified fracture of lower end of unspecified humerus, initial encounter for closed fracture: Secondary | ICD-10-CM | POA: Diagnosis not present

## 2015-07-14 DIAGNOSIS — M810 Age-related osteoporosis without current pathological fracture: Secondary | ICD-10-CM | POA: Diagnosis not present

## 2015-07-14 DIAGNOSIS — S066X0A Traumatic subarachnoid hemorrhage without loss of consciousness, initial encounter: Secondary | ICD-10-CM | POA: Diagnosis not present

## 2015-07-14 DIAGNOSIS — M25579 Pain in unspecified ankle and joints of unspecified foot: Secondary | ICD-10-CM | POA: Diagnosis not present

## 2015-07-14 DIAGNOSIS — I10 Essential (primary) hypertension: Secondary | ICD-10-CM | POA: Diagnosis not present

## 2015-07-14 DIAGNOSIS — H3530 Unspecified macular degeneration: Secondary | ICD-10-CM | POA: Diagnosis not present

## 2015-07-14 DIAGNOSIS — H35059 Retinal neovascularization, unspecified, unspecified eye: Secondary | ICD-10-CM | POA: Diagnosis not present

## 2015-07-14 DIAGNOSIS — M545 Low back pain: Secondary | ICD-10-CM | POA: Diagnosis not present

## 2015-07-14 DIAGNOSIS — M6281 Muscle weakness (generalized): Secondary | ICD-10-CM | POA: Diagnosis not present

## 2015-07-14 DIAGNOSIS — G4700 Insomnia, unspecified: Secondary | ICD-10-CM | POA: Diagnosis not present

## 2015-07-14 DIAGNOSIS — H9190 Unspecified hearing loss, unspecified ear: Secondary | ICD-10-CM | POA: Diagnosis not present

## 2015-07-14 DIAGNOSIS — R41841 Cognitive communication deficit: Secondary | ICD-10-CM | POA: Diagnosis not present

## 2015-07-18 DIAGNOSIS — H35033 Hypertensive retinopathy, bilateral: Secondary | ICD-10-CM | POA: Diagnosis not present

## 2015-07-18 DIAGNOSIS — H353232 Exudative age-related macular degeneration, bilateral, with inactive choroidal neovascularization: Secondary | ICD-10-CM | POA: Diagnosis not present

## 2015-07-18 DIAGNOSIS — H43813 Vitreous degeneration, bilateral: Secondary | ICD-10-CM | POA: Diagnosis not present

## 2015-07-18 DIAGNOSIS — Z961 Presence of intraocular lens: Secondary | ICD-10-CM | POA: Diagnosis not present

## 2015-07-19 DIAGNOSIS — S066X0A Traumatic subarachnoid hemorrhage without loss of consciousness, initial encounter: Secondary | ICD-10-CM | POA: Diagnosis not present

## 2015-07-19 DIAGNOSIS — M545 Low back pain: Secondary | ICD-10-CM | POA: Diagnosis not present

## 2015-07-19 DIAGNOSIS — R2681 Unsteadiness on feet: Secondary | ICD-10-CM | POA: Diagnosis not present

## 2015-07-19 DIAGNOSIS — R41841 Cognitive communication deficit: Secondary | ICD-10-CM | POA: Diagnosis not present

## 2015-07-19 DIAGNOSIS — M6281 Muscle weakness (generalized): Secondary | ICD-10-CM | POA: Diagnosis not present

## 2015-07-19 DIAGNOSIS — I10 Essential (primary) hypertension: Secondary | ICD-10-CM | POA: Diagnosis not present

## 2015-07-21 DIAGNOSIS — R41841 Cognitive communication deficit: Secondary | ICD-10-CM | POA: Diagnosis not present

## 2015-07-21 DIAGNOSIS — M545 Low back pain: Secondary | ICD-10-CM | POA: Diagnosis not present

## 2015-07-21 DIAGNOSIS — I10 Essential (primary) hypertension: Secondary | ICD-10-CM | POA: Diagnosis not present

## 2015-07-21 DIAGNOSIS — M6281 Muscle weakness (generalized): Secondary | ICD-10-CM | POA: Diagnosis not present

## 2015-07-21 DIAGNOSIS — S066X0A Traumatic subarachnoid hemorrhage without loss of consciousness, initial encounter: Secondary | ICD-10-CM | POA: Diagnosis not present

## 2015-07-21 DIAGNOSIS — R2681 Unsteadiness on feet: Secondary | ICD-10-CM | POA: Diagnosis not present

## 2015-07-25 DIAGNOSIS — H6121 Impacted cerumen, right ear: Secondary | ICD-10-CM | POA: Diagnosis not present

## 2015-08-08 ENCOUNTER — Encounter: Payer: Medicare Other | Admitting: Internal Medicine

## 2015-08-08 ENCOUNTER — Non-Acute Institutional Stay: Payer: Medicare Other | Admitting: Nurse Practitioner

## 2015-08-08 ENCOUNTER — Encounter: Payer: Self-pay | Admitting: Nurse Practitioner

## 2015-08-08 DIAGNOSIS — K219 Gastro-esophageal reflux disease without esophagitis: Secondary | ICD-10-CM

## 2015-08-08 DIAGNOSIS — M5440 Lumbago with sciatica, unspecified side: Secondary | ICD-10-CM

## 2015-08-08 DIAGNOSIS — M810 Age-related osteoporosis without current pathological fracture: Secondary | ICD-10-CM

## 2015-08-08 DIAGNOSIS — G4700 Insomnia, unspecified: Secondary | ICD-10-CM

## 2015-08-08 DIAGNOSIS — K5900 Constipation, unspecified: Secondary | ICD-10-CM | POA: Diagnosis not present

## 2015-08-08 DIAGNOSIS — I10 Essential (primary) hypertension: Secondary | ICD-10-CM

## 2015-08-08 DIAGNOSIS — E039 Hypothyroidism, unspecified: Secondary | ICD-10-CM

## 2015-08-08 DIAGNOSIS — E871 Hypo-osmolality and hyponatremia: Secondary | ICD-10-CM | POA: Diagnosis not present

## 2015-08-08 DIAGNOSIS — M8448XA Pathological fracture, other site, initial encounter for fracture: Secondary | ICD-10-CM | POA: Diagnosis not present

## 2015-08-09 NOTE — Progress Notes (Signed)
Patient ID: Melissa Carroll, female   DOB: 05/02/1917, 82 y.o.   MRN: [id number]  Location:  [facility] Room Number: AL 26 Place of Service: AL [facility] Provider:  Lennie Odor Dariella Gillihan NP  GREEN, Viviann Spare, MD  Patient Care Team: Estill Dooms, MD as PCP - General (Internal Medicine) Januel Doolan X, NP as Nurse Practitioner (Nurse Practitioner) Charlotte Crumb, MD as Consulting Physician (Orthopedic Surgery) Wallene Huh, DPM as Consulting Physician (Podiatry) Gaynelle Arabian, MD as Consulting Physician (Orthopedic Surgery) Zebedee Iba, MD as Referring Physician (Ophthalmology) Newman Pies, MD as Consulting Physician (Neurosurgery) [facility] (Skilled Nursing and [place])  Extended Emergency Contact Information Primary Emergency Contact: [place] of [place] Phone: [phone] Mobile Phone: [phone] Relation: Daughter Secondary Emergency Contact: [place] of [place] Phone: [phone] Relation: Grandson  Code Status:  DNR Goals of care: Advanced Directive information Advanced Directives 08/08/2015  Does patient have an advance directive? Yes  Type of Paramedic of [place];Out of facility DNR (pink MOST or yellow form)  Does patient want to make changes to advanced directive? No - Patient declined  Copy of advanced directive(s) in chart? Yes     Chief Complaint  Patient presents with  . Medical Management of Chronic Issues    Routine Visit    HPI:  Pt is a 82 y.o. female seen today for medical management of chronic diseases.  Hx of GERD, stable while on Famotidine 20mg  daily, memory, resides in AL and functioning well, insomnia, sleeps with aid of Trazodone 25mg , back pain, better, ambulates with walker, prn Tramadol 50mg  q6h available to her, thyroid, taking Levothyroxine 45mcg, HTN, controlled on Amlodipine 10mg , Losartan 100mg , Metoprolol 25mg  bid. Not  constipation, taking Senna II qhs.    Past Medical History  Diagnosis Date  . Arthritis   . Osteoarthritis   . Hypertension   . Cataracts, bilateral   . Closed fracture of unspecified part of lower end of humerus   . Retinal neovascularization NOS   . Macular degeneration (senile) of retina, unspecified   . Insomnia, unspecified   . Unspecified hypothyroidism   . Unspecified hearing loss   . Osteoarthrosis, unspecified whether generalized or localized, unspecified site   . Senile osteoporosis   . Urinary tract infection 08/10/2013  . Vertebral fracture 08/09/2013    T7 10%, T12 70%   . Subarachnoid hemorrhage following injury ([place]) 03/31/2012  . Pancreatitis, acute 08/14/2013   Past Surgical History  Procedure Laterality Date  . Ankle fracture surgery  1960  . Abdominal hysterectomy  1942  . Closed reduction with humeral pin insertion  04/01/2012    Procedure: CLOSED REDUCTION WITH HUMERAL PIN INSERTION;  Surgeon: Schuyler Amor, MD;  Location: [facility];  Service: Orthopedics;  Laterality: Left;  . Bladder suspension      No Known Allergies    Medication List       This list is accurate as of: 08/08/15 11:59 PM.  Always use your most recent med list.               acetaminophen 500 MG tablet  Commonly known as:  TYLENOL  Take 500 mg by mouth every 6 (six) hours as needed. Pain     amLODipine 10 MG tablet  Commonly known as:  NORVASC  Take 10 mg by mouth daily.     aspirin EC 81 MG tablet  Take 81 mg by mouth daily.  CERTAVITE/ANTIOXIDANTS PO  Take 1 tablet by mouth every morning.     PRESERVISION AREDS 2 PO  Take 1 capsule by mouth 2 (two) times daily.     famotidine 20 MG tablet  Commonly known as:  PEPCID  Take 20 mg by mouth at bedtime.     levothyroxine 50 MCG tablet  Commonly known as:  SYNTHROID, LEVOTHROID  Take 50 mcg by mouth daily before breakfast.     LORazepam 1 MG tablet  Commonly known as:  ATIVAN  Take 1 tablet by mouth prior to  showers as needed     losartan 100 MG tablet  Commonly known as:  COZAAR  Take 100 mg by mouth daily.     metoprolol tartrate 25 MG tablet  Commonly known as:  LOPRESSOR  Take 75 mg by mouth 2 (two) times daily.     raloxifene 60 MG tablet  Commonly known as:  EVISTA  Take 60 mg by mouth daily.     senna-docusate 8.6-50 MG tablet  Commonly known as:  Senokot-S  Take 2 tablets by mouth at bedtime.     traMADol 50 MG tablet  Commonly known as:  ULTRAM  Take 50 mg by mouth every 6 (six) hours as needed.     traZODone 50 MG tablet  Commonly known as:  DESYREL  Take 25 mg by mouth at bedtime.     Vitamin D-3 1000 units Caps  Take 1,000 Units by mouth every morning.        Review of Systems  Constitutional: Negative for fever, chills and diaphoresis.  HENT: Positive for hearing loss. Negative for congestion, ear discharge, ear pain and tinnitus.   Eyes: Negative for pain, discharge and redness.  Respiratory: Negative for cough, shortness of breath and wheezing.   Cardiovascular: Positive for leg swelling. Negative for chest pain and palpitations.       Trace in ankles.   Gastrointestinal: Negative for nausea, vomiting, abdominal pain, diarrhea and constipation.  Genitourinary: Positive for frequency. Negative for dysuria, urgency, hematuria and flank pain.  Musculoskeletal: Positive for back pain. Negative for myalgias and neck pain.       Lower back across R+L SIJ-better since Norco bid.   Skin: Negative for rash.  Neurological: Negative for dizziness, tremors, seizures, weakness and headaches.  Psychiatric/Behavioral: Negative for suicidal ideas and hallucinations. The patient is not nervous/anxious.     Immunization History  Administered Date(s) Administered  . Influenza-Unspecified 03/02/2013, 02/15/2014, 02/16/2015  . PPD Test 03/23/2012   Pertinent  Health Maintenance Due  Topic Date Due  . DEXA SCAN  12/29/1983  . PNA vac Low Risk Adult (1 of 2 - PCV13)  12/29/1983  . INFLUENZA VACCINE  12/12/2015   Fall Risk  02/07/2015 02/22/2014 02/22/2014  Falls in the past year? No Yes No  Number falls in past yr: - 1 -  Risk for fall due to : - History of fall(s) -   Functional Status Survey:    Filed Vitals:   08/08/15 1114  BP: 136/82  Pulse: 68  Temp: 96 F (35.6 C)  TempSrc: Oral  Resp: 18  Height: 5\' 1"  (1.549 m)  Weight: 154 lb (69.854 kg)   Body mass index is 29.11 kg/(m^2). Physical Exam  Constitutional: She is oriented to person, place, and time. She appears well-developed and well-nourished.  HENT:  Head: Normocephalic and atraumatic.  Eyes: Conjunctivae and EOM are normal. Pupils are equal, round, and reactive to light.  Neck: Normal range of  motion. Neck supple.  Cardiovascular:  No murmur heard. Pulmonary/Chest: Effort normal and breath sounds normal. No respiratory distress. She has no wheezes. She has no rales. She exhibits no tenderness.  Abdominal: Soft. Bowel sounds are normal. She exhibits no distension and no mass. There is no tenderness. There is no rebound and no guarding.  Musculoskeletal: Normal range of motion. She exhibits tenderness. She exhibits no edema.  Unsteady gait. Lower back across R+L SIJ-better managed with Norco bid   Neurological: She is alert and oriented to person, place, and time. She displays normal reflexes. No cranial nerve deficit. She exhibits normal muscle tone. Coordination normal.  Skin: Skin is warm and dry. No rash noted. No erythema.  Psychiatric: She has a normal mood and affect. Her behavior is normal. Judgment and thought content normal. Her mood appears not anxious. Her affect is not angry, not labile and not inappropriate. Her speech is not rapid and/or pressured, not delayed and not slurred. She is not agitated, not aggressive, not slowed and not withdrawn. Thought content is not paranoid and not delusional. Cognition and memory are impaired. She does not express impulsivity or  inappropriate judgment. Depressed: flat affect. She exhibits abnormal recent memory.    Labs reviewed:  Recent Labs  02/09/15  NA 138  K 4.0  BUN 14  CREATININE 0.6   No results for input(s): AST, ALT, ALKPHOS, BILITOT, PROT, ALBUMIN in the last 8760 hours. No results for input(s): WBC, NEUTROABS, HGB, HCT, MCV, PLT in the last 8760 hours. Lab Results  Component Value Date   TSH 4.72 02/09/2015   Lab Results  Component Value Date   HGBA1C 6.0* 08/14/2013   No results found for: CHOL, HDL, LDLCALC, LDLDIRECT, TRIG, CHOLHDL  Significant Diagnostic Results in last 30 days:  No results found.  Assessment/Plan  Constipation Stable, continue Senokot S II nightly.   Fracture, vertebra, pathologic Better lower back pain, ambulates with walker, continue Tramadol 50mg  q6h prn   GERD (gastroesophageal reflux disease) Stable, continue Famotidine 20mg  daily, update CBC  Hypertension Controlled, continue Amlodipine 10mg , Losartan 100mg , Metoprolol 25mg  bid  Hyponatremia Repeat CMP  Hypothyroidism Continue Levothyroxine 17mcg, TSH 2.76 02/25/14, 02/10/15 TSH 4.719, update TSH  Insomnia Stable, continue Trazodone 25mg  daily.   Lower back pain 07/19/14 X-ray lumbar spine and pelvis: T11 subacute process with more than 70% height loss 07/26/14 managing pain with Norco bid.  Pain is reasonably controlled. Continue prn Tramadol 50mg  q6h   Senile osteoporosis Continue Evista 60mg  daily.     Family/ staff Communication: continue AL for care needs.   Labs/tests ordered: CBC, CMP, TSH

## 2015-08-09 NOTE — Assessment & Plan Note (Signed)
Better lower back pain, ambulates with walker, continue Tramadol 50mg  q6h prn

## 2015-08-09 NOTE — Assessment & Plan Note (Signed)
Repeat CMP

## 2015-08-09 NOTE — Assessment & Plan Note (Signed)
Stable, continue Senokot S II nightly.  

## 2015-08-09 NOTE — Assessment & Plan Note (Signed)
07/19/14 X-ray lumbar spine and pelvis: T11 subacute process with more than 70% height loss 07/26/14 managing pain with Norco bid.  Pain is reasonably controlled. Continue prn Tramadol 50mg  q6h

## 2015-08-09 NOTE — Assessment & Plan Note (Signed)
Continue Evista 60mg  daily.

## 2015-08-09 NOTE — Assessment & Plan Note (Signed)
Stable, continue Trazodone 25mg  daily.

## 2015-08-09 NOTE — Assessment & Plan Note (Signed)
Controlled, continue Amlodipine 10mg , Losartan 100mg , Metoprolol 25mg  bid

## 2015-08-09 NOTE — Assessment & Plan Note (Signed)
Continue Levothyroxine 17mcg, TSH 2.76 02/25/14, 02/10/15 TSH 4.719, update TSH

## 2015-08-09 NOTE — Assessment & Plan Note (Addendum)
Stable, continue Famotidine 20mg  daily, update CBC

## 2015-08-16 ENCOUNTER — Emergency Department (HOSPITAL_COMMUNITY): Payer: Medicare Other

## 2015-08-16 ENCOUNTER — Encounter (HOSPITAL_COMMUNITY): Payer: Self-pay | Admitting: *Deleted

## 2015-08-16 ENCOUNTER — Emergency Department (HOSPITAL_COMMUNITY)
Admission: EM | Admit: 2015-08-16 | Discharge: 2015-08-16 | Disposition: A | Discharge status: Home / Self Care | Payer: Medicare Other | Attending: Emergency Medicine | Admitting: Emergency Medicine

## 2015-08-16 DIAGNOSIS — N390 Urinary tract infection, site not specified: Secondary | ICD-10-CM | POA: Diagnosis not present

## 2015-08-16 DIAGNOSIS — Y9289 Other specified places as the place of occurrence of the external cause: Secondary | ICD-10-CM | POA: Insufficient documentation

## 2015-08-16 DIAGNOSIS — S22050A Wedge compression fracture of T5-T6 vertebra, initial encounter for closed fracture: Secondary | ICD-10-CM | POA: Diagnosis not present

## 2015-08-16 DIAGNOSIS — W01198A Fall on same level from slipping, tripping and stumbling with subsequent striking against other object, initial encounter: Secondary | ICD-10-CM | POA: Diagnosis not present

## 2015-08-16 DIAGNOSIS — S0012XA Contusion of left eyelid and periocular area, initial encounter: Secondary | ICD-10-CM | POA: Insufficient documentation

## 2015-08-16 DIAGNOSIS — M1990 Unspecified osteoarthritis, unspecified site: Secondary | ICD-10-CM | POA: Insufficient documentation

## 2015-08-16 DIAGNOSIS — Z79899 Other long term (current) drug therapy: Secondary | ICD-10-CM | POA: Insufficient documentation

## 2015-08-16 DIAGNOSIS — H9190 Unspecified hearing loss, unspecified ear: Secondary | ICD-10-CM | POA: Diagnosis not present

## 2015-08-16 DIAGNOSIS — Z8719 Personal history of other diseases of the digestive system: Secondary | ICD-10-CM | POA: Diagnosis not present

## 2015-08-16 DIAGNOSIS — F0390 Unspecified dementia without behavioral disturbance: Secondary | ICD-10-CM | POA: Diagnosis not present

## 2015-08-16 DIAGNOSIS — Y9389 Activity, other specified: Secondary | ICD-10-CM | POA: Insufficient documentation

## 2015-08-16 DIAGNOSIS — I10 Essential (primary) hypertension: Secondary | ICD-10-CM | POA: Diagnosis not present

## 2015-08-16 DIAGNOSIS — S22060A Wedge compression fracture of T7-T8 vertebra, initial encounter for closed fracture: Secondary | ICD-10-CM | POA: Diagnosis not present

## 2015-08-16 DIAGNOSIS — S098XXA Other specified injuries of head, initial encounter: Secondary | ICD-10-CM | POA: Diagnosis not present

## 2015-08-16 DIAGNOSIS — Y998 Other external cause status: Secondary | ICD-10-CM | POA: Diagnosis not present

## 2015-08-16 DIAGNOSIS — Z8669 Personal history of other diseases of the nervous system and sense organs: Secondary | ICD-10-CM | POA: Insufficient documentation

## 2015-08-16 DIAGNOSIS — M810 Age-related osteoporosis without current pathological fracture: Secondary | ICD-10-CM | POA: Insufficient documentation

## 2015-08-16 DIAGNOSIS — W19XXXA Unspecified fall, initial encounter: Secondary | ICD-10-CM

## 2015-08-16 DIAGNOSIS — S0083XA Contusion of other part of head, initial encounter: Secondary | ICD-10-CM

## 2015-08-16 DIAGNOSIS — S199XXA Unspecified injury of neck, initial encounter: Secondary | ICD-10-CM | POA: Diagnosis not present

## 2015-08-16 DIAGNOSIS — S0181XA Laceration without foreign body of other part of head, initial encounter: Secondary | ICD-10-CM | POA: Diagnosis not present

## 2015-08-16 DIAGNOSIS — Z7982 Long term (current) use of aspirin: Secondary | ICD-10-CM | POA: Diagnosis not present

## 2015-08-16 DIAGNOSIS — R259 Unspecified abnormal involuntary movements: Secondary | ICD-10-CM | POA: Diagnosis not present

## 2015-08-16 DIAGNOSIS — S0993XA Unspecified injury of face, initial encounter: Secondary | ICD-10-CM | POA: Diagnosis present

## 2015-08-16 DIAGNOSIS — S0990XA Unspecified injury of head, initial encounter: Secondary | ICD-10-CM | POA: Diagnosis not present

## 2015-08-16 LAB — CBC WITH DIFFERENTIAL/PLATELET
BASOS ABS: 0 10*3/uL (ref 0.0–0.1)
Basophils Relative: 1 %
EOS PCT: 3 %
Eosinophils Absolute: 0.2 10*3/uL (ref 0.0–0.7)
HCT: 43.4 % (ref 36.0–46.0)
HEMOGLOBIN: 14.2 g/dL (ref 12.0–15.0)
LYMPHS ABS: 2.1 10*3/uL (ref 0.7–4.0)
LYMPHS PCT: 31 %
MCH: 32.3 pg (ref 26.0–34.0)
MCHC: 32.7 g/dL (ref 30.0–36.0)
MCV: 98.9 fL (ref 78.0–100.0)
Monocytes Absolute: 0.5 10*3/uL (ref 0.1–1.0)
Monocytes Relative: 7 %
NEUTROS ABS: 4.1 10*3/uL (ref 1.7–7.7)
NEUTROS PCT: 60 %
PLATELETS: 173 10*3/uL (ref 150–400)
RBC: 4.39 MIL/uL (ref 3.87–5.11)
RDW: 13.5 % (ref 11.5–15.5)
WBC: 6.8 10*3/uL (ref 4.0–10.5)

## 2015-08-16 LAB — URINALYSIS, ROUTINE W REFLEX MICROSCOPIC
Bilirubin Urine: NEGATIVE
Glucose, UA: NEGATIVE mg/dL
Hgb urine dipstick: NEGATIVE
Ketones, ur: NEGATIVE mg/dL
Nitrite: NEGATIVE
PROTEIN: NEGATIVE mg/dL
SPECIFIC GRAVITY, URINE: 1.017 (ref 1.005–1.030)
pH: 5.5 (ref 5.0–8.0)

## 2015-08-16 LAB — BASIC METABOLIC PANEL
ANION GAP: 12 (ref 5–15)
BUN: 18 mg/dL (ref 6–20)
CHLORIDE: 103 mmol/L (ref 101–111)
CO2: 23 mmol/L (ref 22–32)
Calcium: 9.4 mg/dL (ref 8.9–10.3)
Creatinine, Ser: 0.82 mg/dL (ref 0.44–1.00)
GFR, EST NON AFRICAN AMERICAN: 59 mL/min — AB (ref >60)
Glucose, Bld: 197 mg/dL — ABNORMAL HIGH (ref 65–99)
POTASSIUM: 3.8 mmol/L (ref 3.5–5.1)
SODIUM: 138 mmol/L (ref 135–145)

## 2015-08-16 LAB — URINE MICROSCOPIC-ADD ON: RBC / HPF: NONE SEEN RBC/hpf (ref 0–5)

## 2015-08-16 LAB — I-STAT TROPONIN, ED: TROPONIN I, POC: 0 ng/mL (ref 0.00–0.08)

## 2015-08-16 MED ORDER — CEPHALEXIN 500 MG PO CAPS: 500.0000 mg | ORAL_CAPSULE | Freq: Three times a day (TID) | ORAL | Status: DC

## 2015-08-16 NOTE — ED Notes (Signed)
Pt arrives from [facility] via [facility] c/o fall, pt fell and hit head, have a hematoma above the left eye noted. Pt unable to verify if LOC occurred. Denies any pain to neck/back. Hx of dementia.

## 2015-08-16 NOTE — ED Provider Notes (Signed)
CSN: [id number]     Arrival date & time 08/16/15  [id number] History   First MD Initiated Contact with Patient 08/16/15 1009     Chief Complaint  Patient presents with  . Fall     HPI   Level V Caveat due to dementia  Melissa Carroll is an 82 y.o. female with a history of dementia, hearing loss, osteoporosis, HTN who presents to the ED From her nursing home ([facility]) for evaluation after an unwitnessed fall. Per EMS staff heard pt yelling in the room and she was found down on the floor with a hematoma to her left eye/temporal region. In the ED pt has no complaints. She is unable to say why she is here. She does not remember falling. She is oriented to person only. She has no complaints and denies any pain. Denies chest pain, SOB.    Past Medical History  Diagnosis Date  . Arthritis   . Osteoarthritis   . Hypertension   . Cataracts, bilateral   . Closed fracture of unspecified part of lower end of humerus   . Retinal neovascularization NOS   . Macular degeneration (senile) of retina, unspecified   . Insomnia, unspecified   . Unspecified hypothyroidism   . Unspecified hearing loss   . Osteoarthrosis, unspecified whether generalized or localized, unspecified site   . Senile osteoporosis   . Urinary tract infection 08/10/2013  . Vertebral fracture 08/09/2013    T7 10%, T12 70%   . Subarachnoid hemorrhage following injury ([place]) 03/31/2012  . Pancreatitis, acute 08/14/2013   Past Surgical History  Procedure Laterality Date  . Ankle fracture surgery  1960  . Abdominal hysterectomy  1942  . Closed reduction with humeral pin insertion  04/01/2012    Procedure: CLOSED REDUCTION WITH HUMERAL PIN INSERTION;  Surgeon: Schuyler Amor, MD;  Location: [facility];  Service: Orthopedics;  Laterality: Left;  . Bladder suspension     Family History  Problem Relation Age of Onset  . Hypertension Mother    Social History  Substance Use Topics  . Smoking status: Never Smoker   . Smokeless  tobacco: Never Used  . Alcohol Use: No   OB History    No data available     Review of Systems  All other systems reviewed and are negative.     Allergies  Review of patient's allergies indicates no known allergies.  Home Medications   Prior to Admission medications   Medication Sig Start Date End Date Taking? Authorizing Provider  acetaminophen (TYLENOL) 500 MG tablet Take 500 mg by mouth every 6 (six) hours as needed. Pain    Historical Provider, MD  amLODipine (NORVASC) 10 MG tablet Take 10 mg by mouth daily.    Historical Provider, MD  aspirin EC 81 MG tablet Take 81 mg by mouth daily.    Historical Provider, MD  Cholecalciferol (VITAMIN D-3) 1000 UNITS CAPS Take 1,000 Units by mouth every morning.    Historical Provider, MD  famotidine (PEPCID) 20 MG tablet Take 20 mg by mouth at bedtime.    Historical Provider, MD  levothyroxine (SYNTHROID, LEVOTHROID) 50 MCG tablet Take 50 mcg by mouth daily before breakfast.    Historical Provider, MD  LORazepam (ATIVAN) 1 MG tablet Take 1 tablet by mouth prior to showers as needed    Historical Provider, MD  losartan (COZAAR) 100 MG tablet Take 100 mg by mouth daily.    Historical Provider, MD  metoprolol tartrate (LOPRESSOR) 25 MG tablet  Take 75 mg by mouth 2 (two) times daily.    Historical Provider, MD  Multiple Vitamins-Minerals (CERTAVITE/ANTIOXIDANTS PO) Take 1 tablet by mouth every morning.    Historical Provider, MD  Multiple Vitamins-Minerals (PRESERVISION AREDS 2 PO) Take 1 capsule by mouth 2 (two) times daily.    Historical Provider, MD  raloxifene (EVISTA) 60 MG tablet Take 60 mg by mouth daily.    Historical Provider, MD  senna-docusate (SENOKOT-S) 8.6-50 MG per tablet Take 2 tablets by mouth at bedtime.    Historical Provider, MD  traMADol (ULTRAM) 50 MG tablet Take 50 mg by mouth every 6 (six) hours as needed.    Historical Provider, MD  traZODone (DESYREL) 50 MG tablet Take 25 mg by mouth at bedtime.     Historical Provider,  MD   BP 148/61 mmHg  Pulse 66  Temp(Src) 98.2 F (36.8 C) (Oral)  Resp 18  Ht 5\' 1"  (1.549 m)  Wt 69.4 kg  BMI 28.92 kg/m2  SpO2 98% Physical Exam  HENT:  Head:    Right Ear: External ear normal.  Left Ear: External ear normal.  Nose: Nose normal.  Mouth/Throat: Oropharynx is clear and moist. No oropharyngeal exudate.  Eyes: Conjunctivae and EOM are normal. Pupils are equal, round, and reactive to light.  Neck: Normal range of motion. Neck supple.  No c-spine tenderness  Cardiovascular: Normal rate, regular rhythm, normal heart sounds and intact distal pulses.   Pulmonary/Chest: Effort normal and breath sounds normal. No respiratory distress. She has no wheezes. She exhibits no tenderness.  Abdominal: Soft. Bowel sounds are normal. She exhibits no distension. There is no tenderness.  Musculoskeletal:  No t-spine or l-spine tenderness. No clavicular, shoulder, arm, or hand tenderness bilaterally.   No hip or lower extremity tenderness.  No LE edema  Neurological: She is alert. No cranial nerve deficit.  Intact grip strength bilaterally. Normal finger to nose. No pronator drift.   Oriented to person only.   Skin: Skin is warm and dry.  Psychiatric: She has a normal mood and affect.  Nursing note and vitals reviewed.  Filed Vitals:   08/16/15 1002 08/16/15 1015 08/16/15 1200  BP: 148/61 146/64 151/73  Pulse: 66 64 57  Temp: 98.2 F (36.8 C)    TempSrc: Oral    Resp: 18 15   Height: 5\' 1"  (1.549 m)    Weight: 69.4 kg    SpO2: 98% 99% 96%     ED Course  Procedures (including critical care time) Labs Review Labs Reviewed  URINALYSIS, ROUTINE W REFLEX MICROSCOPIC (NOT AT [facility]) - Abnormal; Notable for the following:    APPearance CLOUDY (*)    Leukocytes, UA MODERATE (*)    All other components within normal limits  BASIC METABOLIC PANEL - Abnormal; Notable for the following:    Glucose, Bld 197 (*)    GFR calc non Af Amer 59 (*)    All other components within  normal limits  URINE MICROSCOPIC-ADD ON - Abnormal; Notable for the following:    Squamous Epithelial / LPF 6-30 (*)    Bacteria, UA FEW (*)    All other components within normal limits  URINE CULTURE  CBC WITH DIFFERENTIAL/PLATELET  Randolm Idol, ED    Imaging Review Dg Chest 2 View  08/16/2015  CLINICAL DATA:  Pain following fall EXAM: CHEST  2 VIEW COMPARISON:  Sep 11, 2013 FINDINGS: There is no edema or consolidation. Heart is upper normal in size with pulmonary vascular within normal limits.  There is extensive atherosclerotic calcification in the aorta. No adenopathy. Marked compression of the T6 and T11 vertebral bodies remains. In the interval since the prior study, there is anterior wedging at the T10 vertebral body. IMPRESSION: Wedge compression fractures at T6 and T11, marked in stable. Anterior wedging at [id number], age uncertain but not present on prior study from 70. There is no edema or consolidation. Stable cardiac silhouette. Bones are somewhat osteoporotic. Electronically Signed   By: Lowella Grip III M.D.   On: 08/16/2015 10:45   Ct Head Wo Contrast  08/16/2015  CLINICAL DATA:  Fall. Pt unsure of how she fell. Pt has hematoma above left eye and left eye bruising. Pt denies headache or head complaints. Pt denies neck pain. EXAM: CT HEAD WITHOUT CONTRAST CT CERVICAL SPINE WITHOUT CONTRAST TECHNIQUE: Multidetector CT imaging of the head and cervical spine was performed following the standard protocol without intravenous contrast. Multiplanar CT image reconstructions of the cervical spine were also generated. COMPARISON:  06/12/2015 FINDINGS: CT HEAD FINDINGS Sclerotic right parafalcine frontal bone lesion stable. Atherosclerotic and physiologic intracranial calcifications. Marked diffuse parenchymal atrophy with prominent extra-axial CSF attenuation collections particularly in the frontal regions. Patchy areas of hypoattenuation in deep and periventricular white matter bilaterally.  Negative for acute intracranial hemorrhage, mass lesion, acute infarction, midline shift, or mass-effect. Acute infarct may be inapparent on noncontrast CT. Ventricles and sulci symmetric. Bone windows demonstrate no acute lesion. CT CERVICAL SPINE FINDINGS Normal alignment. No fracture or dislocation. No prevertebral soft tissue swelling. Moderate narrowing of interspaces C4- C7. Osseous foraminal stenosis left C4-5, bilaterally C5-6. Bilateral calcified carotid plaque. IMPRESSION: 1.   1.  Negative for bleed or other acute intracranial process. 2. Atrophy and nonspecific white matter changes. 3. Negative for cervical fracture or other acute finding. 4. Cervical spondylitic changes C4-C7 as above. Electronically Signed   By: Lucrezia Europe M.D.   On: 08/16/2015 11:13   Ct Cervical Spine Wo Contrast  08/16/2015  CLINICAL DATA:  Fall. Pt unsure of how she fell. Pt has hematoma above left eye and left eye bruising. Pt denies headache or head complaints. Pt denies neck pain. EXAM: CT HEAD WITHOUT CONTRAST CT CERVICAL SPINE WITHOUT CONTRAST TECHNIQUE: Multidetector CT imaging of the head and cervical spine was performed following the standard protocol without intravenous contrast. Multiplanar CT image reconstructions of the cervical spine were also generated. COMPARISON:  06/12/2015 FINDINGS: CT HEAD FINDINGS Sclerotic right parafalcine frontal bone lesion stable. Atherosclerotic and physiologic intracranial calcifications. Marked diffuse parenchymal atrophy with prominent extra-axial CSF attenuation collections particularly in the frontal regions. Patchy areas of hypoattenuation in deep and periventricular white matter bilaterally. Negative for acute intracranial hemorrhage, mass lesion, acute infarction, midline shift, or mass-effect. Acute infarct may be inapparent on noncontrast CT. Ventricles and sulci symmetric. Bone windows demonstrate no acute lesion. CT CERVICAL SPINE FINDINGS Normal alignment. No fracture or  dislocation. No prevertebral soft tissue swelling. Moderate narrowing of interspaces C4- C7. Osseous foraminal stenosis left C4-5, bilaterally C5-6. Bilateral calcified carotid plaque. IMPRESSION: 1.   1.  Negative for bleed or other acute intracranial process. 2. Atrophy and nonspecific white matter changes. 3. Negative for cervical fracture or other acute finding. 4. Cervical spondylitic changes C4-C7 as above. Electronically Signed   By: Lucrezia Europe M.D.   On: 08/16/2015 11:13   I have personally reviewed and evaluated these images and lab results as part of my medical decision-making.   EKG Interpretation   Date/Time:  Wednesday August 16 2015 10:05:29  EDT Ventricular Rate:  72 PR Interval:  193 QRS Duration: 111 QT Interval:  410 QTC Calculation: 449 R Axis:   -10 Text Interpretation:  Sinus rhythm Probable left ventricular hypertrophy  Probable inferior infarct, age indeterminate Anterior Q waves, possibly  due to LVH T wave inversions in V5-V6 new from previous Confirmed by  LITTLE MD, Jerome [phone]) on 08/16/2015 10:27:22 AM      MDM   Final diagnoses:  Fall, initial encounter  Contusion of face, initial encounter  Urinary tract infection without hematuria, site unspecified    Given age, h/o dementia with unwitnessed fall I will obtain CT head and c-spine. Will also check basic labs including cbc, bmp, troponin. Will obtain EKG and CXR.  EKG with some new t-wave inversions since prior. I was also able to speak with Elmyra Ricks, pt's nurse at [facility]. She states that pt was acting at baseline up until this morning. Pt reportedly ate breakfast as usual but later was heard to be screaming/crying in her room by cleaning staff. Staff found the pt laying on her left side with hematoma to left periorbital region. Pt's nurse states that at baseline pt is fairly steady/ambulatory with her walker, but her walker was far across the room from her so she is not sure if pt tripped or passed out  or how exactly she fell. Pt has apparently had increased falls lately though last fall was 1-2 months ago. No anticoagulation use.   Bloodwork unremarkable including negative troponin. CT head and c-spine negative for acute findings. CXR shows stable vertebral body wedge fractures. Her UA does show borderline findings including 6-30 WBC, few bacteria, and 6-30 sq epithelial cells per hpf. Given that pt's age and poor historian will treat as UTI to prevent worsening condition. Urine sent for culture. Pt otherwise has no complaints in the ED. She has been ambulatory and remained asymptomatic. She was seen and evaluated by Dr. Rex Kras, attending physician. At this time pt is stable for discharge back to [facility] with instructions for close PCP f/u. She was given rx for Keflex. ER return precautions also given.    Anne Ng, PA-C 08/16/15 [place], MD 08/16/15 [phone]

## 2015-08-16 NOTE — Discharge Instructions (Signed)
Melissa Carroll was seen in the emergency room today for evaluation after an unwitnessed fall. Her CT scans were unremarkable. Her chest x-ray showed some compression fractures that are stable from prior. Her bloodwork was unremarkable. Her urine did show evidence of possible infection. We will treat it as a UTI and send her urine for culture. Please have her follow up with her primary care provider this week. Return to the emergency room for new or worsening symptoms.

## 2015-08-17 LAB — URINE CULTURE: Culture: 1000 — AB

## 2015-08-21 DIAGNOSIS — E039 Hypothyroidism, unspecified: Secondary | ICD-10-CM | POA: Diagnosis not present

## 2015-08-21 DIAGNOSIS — I10 Essential (primary) hypertension: Secondary | ICD-10-CM | POA: Diagnosis not present

## 2015-08-21 LAB — CBC AND DIFFERENTIAL
HEMATOCRIT: 41 % (ref 36–46)
Hemoglobin: 13.8 g/dL (ref 12.0–16.0)
Platelets: 172 10*3/uL (ref 150–399)
WBC: 6.4 10^3/mL

## 2015-08-21 LAB — BASIC METABOLIC PANEL
BUN: 17 mg/dL (ref 4–21)
Creatinine: 0.5 mg/dL (ref 0.5–1.1)
Glucose: 137 mg/dL
Potassium: 4.3 mmol/L (ref 3.4–5.3)
SODIUM: 140 mmol/L (ref 137–147)

## 2015-08-21 LAB — HEPATIC FUNCTION PANEL
ALK PHOS: 53 U/L (ref 25–125)
ALT: 19 U/L (ref 7–35)
AST: 15 U/L (ref 13–35)
BILIRUBIN, TOTAL: 0.9 mg/dL

## 2015-08-21 LAB — TSH: TSH: 3.31 u[IU]/mL (ref <=5.90)

## 2015-08-22 ENCOUNTER — Encounter: Payer: Self-pay | Admitting: Nurse Practitioner

## 2015-08-22 ENCOUNTER — Encounter: Payer: Medicare Other | Admitting: Internal Medicine

## 2015-08-22 NOTE — Progress Notes (Signed)
This encounter was created in error - please disregard.

## 2015-08-31 DIAGNOSIS — S42409A Unspecified fracture of lower end of unspecified humerus, initial encounter for closed fracture: Secondary | ICD-10-CM | POA: Diagnosis not present

## 2015-08-31 DIAGNOSIS — M810 Age-related osteoporosis without current pathological fracture: Secondary | ICD-10-CM | POA: Diagnosis not present

## 2015-08-31 DIAGNOSIS — M159 Polyosteoarthritis, unspecified: Secondary | ICD-10-CM | POA: Diagnosis not present

## 2015-08-31 DIAGNOSIS — M6281 Muscle weakness (generalized): Secondary | ICD-10-CM | POA: Diagnosis not present

## 2015-08-31 DIAGNOSIS — M25579 Pain in unspecified ankle and joints of unspecified foot: Secondary | ICD-10-CM | POA: Diagnosis not present

## 2015-08-31 DIAGNOSIS — H9190 Unspecified hearing loss, unspecified ear: Secondary | ICD-10-CM | POA: Diagnosis not present

## 2015-08-31 DIAGNOSIS — S066X0A Traumatic subarachnoid hemorrhage without loss of consciousness, initial encounter: Secondary | ICD-10-CM | POA: Diagnosis not present

## 2015-08-31 DIAGNOSIS — R41841 Cognitive communication deficit: Secondary | ICD-10-CM | POA: Diagnosis not present

## 2015-08-31 DIAGNOSIS — R2681 Unsteadiness on feet: Secondary | ICD-10-CM | POA: Diagnosis not present

## 2015-08-31 DIAGNOSIS — R296 Repeated falls: Secondary | ICD-10-CM | POA: Diagnosis not present

## 2015-08-31 DIAGNOSIS — I10 Essential (primary) hypertension: Secondary | ICD-10-CM | POA: Diagnosis not present

## 2015-08-31 DIAGNOSIS — H3530 Unspecified macular degeneration: Secondary | ICD-10-CM | POA: Diagnosis not present

## 2015-08-31 DIAGNOSIS — H35059 Retinal neovascularization, unspecified, unspecified eye: Secondary | ICD-10-CM | POA: Diagnosis not present

## 2015-08-31 DIAGNOSIS — G4700 Insomnia, unspecified: Secondary | ICD-10-CM | POA: Diagnosis not present

## 2015-08-31 DIAGNOSIS — W19XXXA Unspecified fall, initial encounter: Secondary | ICD-10-CM | POA: Diagnosis not present

## 2015-08-31 DIAGNOSIS — M545 Low back pain: Secondary | ICD-10-CM | POA: Diagnosis not present

## 2015-09-04 DIAGNOSIS — R41841 Cognitive communication deficit: Secondary | ICD-10-CM | POA: Diagnosis not present

## 2015-09-04 DIAGNOSIS — I10 Essential (primary) hypertension: Secondary | ICD-10-CM | POA: Diagnosis not present

## 2015-09-04 DIAGNOSIS — R2681 Unsteadiness on feet: Secondary | ICD-10-CM | POA: Diagnosis not present

## 2015-09-04 DIAGNOSIS — R296 Repeated falls: Secondary | ICD-10-CM | POA: Diagnosis not present

## 2015-09-04 DIAGNOSIS — M6281 Muscle weakness (generalized): Secondary | ICD-10-CM | POA: Diagnosis not present

## 2015-09-04 DIAGNOSIS — S066X0A Traumatic subarachnoid hemorrhage without loss of consciousness, initial encounter: Secondary | ICD-10-CM | POA: Diagnosis not present

## 2015-09-05 DIAGNOSIS — M6281 Muscle weakness (generalized): Secondary | ICD-10-CM | POA: Diagnosis not present

## 2015-09-05 DIAGNOSIS — R41841 Cognitive communication deficit: Secondary | ICD-10-CM | POA: Diagnosis not present

## 2015-09-05 DIAGNOSIS — S066X0A Traumatic subarachnoid hemorrhage without loss of consciousness, initial encounter: Secondary | ICD-10-CM | POA: Diagnosis not present

## 2015-09-05 DIAGNOSIS — I10 Essential (primary) hypertension: Secondary | ICD-10-CM | POA: Diagnosis not present

## 2015-09-05 DIAGNOSIS — R296 Repeated falls: Secondary | ICD-10-CM | POA: Diagnosis not present

## 2015-09-05 DIAGNOSIS — R2681 Unsteadiness on feet: Secondary | ICD-10-CM | POA: Diagnosis not present

## 2015-09-07 DIAGNOSIS — I10 Essential (primary) hypertension: Secondary | ICD-10-CM | POA: Diagnosis not present

## 2015-09-07 DIAGNOSIS — S066X0A Traumatic subarachnoid hemorrhage without loss of consciousness, initial encounter: Secondary | ICD-10-CM | POA: Diagnosis not present

## 2015-09-07 DIAGNOSIS — R41841 Cognitive communication deficit: Secondary | ICD-10-CM | POA: Diagnosis not present

## 2015-09-07 DIAGNOSIS — R2681 Unsteadiness on feet: Secondary | ICD-10-CM | POA: Diagnosis not present

## 2015-09-07 DIAGNOSIS — M6281 Muscle weakness (generalized): Secondary | ICD-10-CM | POA: Diagnosis not present

## 2015-09-07 DIAGNOSIS — R296 Repeated falls: Secondary | ICD-10-CM | POA: Diagnosis not present

## 2015-09-11 DIAGNOSIS — S42409A Unspecified fracture of lower end of unspecified humerus, initial encounter for closed fracture: Secondary | ICD-10-CM | POA: Diagnosis not present

## 2015-09-11 DIAGNOSIS — S066X0A Traumatic subarachnoid hemorrhage without loss of consciousness, initial encounter: Secondary | ICD-10-CM | POA: Diagnosis not present

## 2015-09-11 DIAGNOSIS — M6281 Muscle weakness (generalized): Secondary | ICD-10-CM | POA: Diagnosis not present

## 2015-09-11 DIAGNOSIS — R2681 Unsteadiness on feet: Secondary | ICD-10-CM | POA: Diagnosis not present

## 2015-09-11 DIAGNOSIS — H35059 Retinal neovascularization, unspecified, unspecified eye: Secondary | ICD-10-CM | POA: Diagnosis not present

## 2015-09-11 DIAGNOSIS — M159 Polyosteoarthritis, unspecified: Secondary | ICD-10-CM | POA: Diagnosis not present

## 2015-09-11 DIAGNOSIS — W19XXXA Unspecified fall, initial encounter: Secondary | ICD-10-CM | POA: Diagnosis not present

## 2015-09-11 DIAGNOSIS — H9190 Unspecified hearing loss, unspecified ear: Secondary | ICD-10-CM | POA: Diagnosis not present

## 2015-09-11 DIAGNOSIS — I10 Essential (primary) hypertension: Secondary | ICD-10-CM | POA: Diagnosis not present

## 2015-09-11 DIAGNOSIS — H3530 Unspecified macular degeneration: Secondary | ICD-10-CM | POA: Diagnosis not present

## 2015-09-11 DIAGNOSIS — M545 Low back pain: Secondary | ICD-10-CM | POA: Diagnosis not present

## 2015-09-11 DIAGNOSIS — G4700 Insomnia, unspecified: Secondary | ICD-10-CM | POA: Diagnosis not present

## 2015-09-11 DIAGNOSIS — R296 Repeated falls: Secondary | ICD-10-CM | POA: Diagnosis not present

## 2015-09-11 DIAGNOSIS — M810 Age-related osteoporosis without current pathological fracture: Secondary | ICD-10-CM | POA: Diagnosis not present

## 2015-09-11 DIAGNOSIS — R41841 Cognitive communication deficit: Secondary | ICD-10-CM | POA: Diagnosis not present

## 2015-09-11 DIAGNOSIS — M25579 Pain in unspecified ankle and joints of unspecified foot: Secondary | ICD-10-CM | POA: Diagnosis not present

## 2015-09-12 DIAGNOSIS — R41841 Cognitive communication deficit: Secondary | ICD-10-CM | POA: Diagnosis not present

## 2015-09-12 DIAGNOSIS — R296 Repeated falls: Secondary | ICD-10-CM | POA: Diagnosis not present

## 2015-09-12 DIAGNOSIS — S066X0A Traumatic subarachnoid hemorrhage without loss of consciousness, initial encounter: Secondary | ICD-10-CM | POA: Diagnosis not present

## 2015-09-12 DIAGNOSIS — R2681 Unsteadiness on feet: Secondary | ICD-10-CM | POA: Diagnosis not present

## 2015-09-12 DIAGNOSIS — M6281 Muscle weakness (generalized): Secondary | ICD-10-CM | POA: Diagnosis not present

## 2015-09-12 DIAGNOSIS — I10 Essential (primary) hypertension: Secondary | ICD-10-CM | POA: Diagnosis not present

## 2015-09-14 DIAGNOSIS — S066X0A Traumatic subarachnoid hemorrhage without loss of consciousness, initial encounter: Secondary | ICD-10-CM | POA: Diagnosis not present

## 2015-09-14 DIAGNOSIS — I10 Essential (primary) hypertension: Secondary | ICD-10-CM | POA: Diagnosis not present

## 2015-09-14 DIAGNOSIS — M6281 Muscle weakness (generalized): Secondary | ICD-10-CM | POA: Diagnosis not present

## 2015-09-14 DIAGNOSIS — R296 Repeated falls: Secondary | ICD-10-CM | POA: Diagnosis not present

## 2015-09-14 DIAGNOSIS — R2681 Unsteadiness on feet: Secondary | ICD-10-CM | POA: Diagnosis not present

## 2015-09-14 DIAGNOSIS — R41841 Cognitive communication deficit: Secondary | ICD-10-CM | POA: Diagnosis not present

## 2015-09-18 DIAGNOSIS — M6281 Muscle weakness (generalized): Secondary | ICD-10-CM | POA: Diagnosis not present

## 2015-09-18 DIAGNOSIS — S066X0A Traumatic subarachnoid hemorrhage without loss of consciousness, initial encounter: Secondary | ICD-10-CM | POA: Diagnosis not present

## 2015-09-18 DIAGNOSIS — R2681 Unsteadiness on feet: Secondary | ICD-10-CM | POA: Diagnosis not present

## 2015-09-18 DIAGNOSIS — I10 Essential (primary) hypertension: Secondary | ICD-10-CM | POA: Diagnosis not present

## 2015-09-18 DIAGNOSIS — R41841 Cognitive communication deficit: Secondary | ICD-10-CM | POA: Diagnosis not present

## 2015-09-18 DIAGNOSIS — R296 Repeated falls: Secondary | ICD-10-CM | POA: Diagnosis not present

## 2015-09-19 DIAGNOSIS — S066X0A Traumatic subarachnoid hemorrhage without loss of consciousness, initial encounter: Secondary | ICD-10-CM | POA: Diagnosis not present

## 2015-09-19 DIAGNOSIS — R41841 Cognitive communication deficit: Secondary | ICD-10-CM | POA: Diagnosis not present

## 2015-09-19 DIAGNOSIS — I10 Essential (primary) hypertension: Secondary | ICD-10-CM | POA: Diagnosis not present

## 2015-09-19 DIAGNOSIS — R2681 Unsteadiness on feet: Secondary | ICD-10-CM | POA: Diagnosis not present

## 2015-09-19 DIAGNOSIS — R296 Repeated falls: Secondary | ICD-10-CM | POA: Diagnosis not present

## 2015-09-19 DIAGNOSIS — M6281 Muscle weakness (generalized): Secondary | ICD-10-CM | POA: Diagnosis not present

## 2015-09-20 ENCOUNTER — Encounter: Payer: Self-pay | Admitting: Nurse Practitioner

## 2015-09-20 NOTE — Progress Notes (Signed)
This encounter was created in error - please disregard.

## 2015-09-21 ENCOUNTER — Encounter: Payer: Self-pay | Admitting: Nurse Practitioner

## 2015-09-21 ENCOUNTER — Non-Acute Institutional Stay: Payer: Medicare Other | Admitting: Nurse Practitioner

## 2015-09-21 DIAGNOSIS — K5900 Constipation, unspecified: Secondary | ICD-10-CM | POA: Diagnosis not present

## 2015-09-21 DIAGNOSIS — E039 Hypothyroidism, unspecified: Secondary | ICD-10-CM | POA: Diagnosis not present

## 2015-09-21 DIAGNOSIS — M5440 Lumbago with sciatica, unspecified side: Secondary | ICD-10-CM

## 2015-09-21 DIAGNOSIS — I10 Essential (primary) hypertension: Secondary | ICD-10-CM

## 2015-09-21 DIAGNOSIS — E871 Hypo-osmolality and hyponatremia: Secondary | ICD-10-CM | POA: Diagnosis not present

## 2015-09-21 DIAGNOSIS — G4700 Insomnia, unspecified: Secondary | ICD-10-CM

## 2015-09-21 DIAGNOSIS — R413 Other amnesia: Secondary | ICD-10-CM | POA: Diagnosis not present

## 2015-09-21 DIAGNOSIS — M8448XS Pathological fracture, other site, sequela: Secondary | ICD-10-CM

## 2015-09-21 DIAGNOSIS — K219 Gastro-esophageal reflux disease without esophagitis: Secondary | ICD-10-CM

## 2015-09-21 NOTE — Assessment & Plan Note (Signed)
Still functioning adequately in AL

## 2015-09-21 NOTE — Assessment & Plan Note (Signed)
Continue Levothyroxine 33mcg, TSH 2.76 02/25/14, 02/10/15 TSH 4.719

## 2015-09-21 NOTE — Assessment & Plan Note (Signed)
Stable, continue Famotidine 20mg daily.   

## 2015-09-21 NOTE — Assessment & Plan Note (Signed)
07/19/14 X-ray lumbar spine and pelvis: T11 subacute process with more than 70% height loss 07/26/14 managing pain with Norco bid.  Pain is reasonably controlled. Continue prn Tramadol 50mg  q6h

## 2015-09-21 NOTE — Progress Notes (Signed)
Patient ID: Melissa Carroll, female   DOB: 1916-07-23, 82 y.o.   MRN: [id number]  Location:  [place] Room Number: AL 26 Place of Service: AL [facility] Provider:  Lennie Odor Tayshawn Purnell NP  GREEN, Viviann Spare, MD  Patient Care Team: Estill Dooms, MD as PCP - General (Internal Medicine) Teodoro Jeffreys X, NP as Nurse Practitioner (Nurse Practitioner) Charlotte Crumb, MD as Consulting Physician (Orthopedic Surgery) Wallene Huh, DPM as Consulting Physician (Podiatry) Gaynelle Arabian, MD as Consulting Physician (Orthopedic Surgery) Zebedee Iba, MD as Referring Physician (Ophthalmology) Newman Pies, MD as Consulting Physician (Neurosurgery) [facility] (Skilled Nursing and [place])  Extended Emergency Contact Information Primary Emergency Contact: [place] of [place] Phone: [phone] Mobile Phone: [phone] Relation: Daughter Secondary Emergency Contact: [place] of [place] Phone: [phone] Relation: Grandson  Code Status:  DNR Goals of care: Advanced Directive information Advanced Directives 09/21/2015  Does patient have an advance directive? Yes  Type of Paramedic of [place];Out of facility DNR (pink MOST or yellow form);Living will  Does patient want to make changes to advanced directive? No - Patient declined  Copy of advanced directive(s) in chart? Yes     Chief Complaint  Patient presents with  . Medical Management of Chronic Issues    Routine Visit    HPI:  Pt is a 82 y.o. female seen today for medical management of chronic diseases.  Hx of GERD, stable while on Famotidine 20mg  daily, memory, resides in AL and functioning well, insomnia, sleeps with aid of Trazodone 25mg , back pain, better, ambulates with walker, prn Tramadol 50mg  q6h available to her, thyroid, taking Levothyroxine 84mcg, HTN, controlled on Amlodipine 10mg , Losartan 100mg , Metoprolol 25mg  bid.  Not constipation, taking Senna II qhs.    Past Medical History  Diagnosis Date  . Arthritis   . Osteoarthritis   . Hypertension   . Cataracts, bilateral   . Closed fracture of unspecified part of lower end of humerus   . Retinal neovascularization NOS   . Macular degeneration (senile) of retina, unspecified   . Insomnia, unspecified   . Unspecified hypothyroidism   . Unspecified hearing loss   . Osteoarthrosis, unspecified whether generalized or localized, unspecified site   . Senile osteoporosis   . Urinary tract infection 08/10/2013  . Vertebral fracture 08/09/2013    T7 10%, T12 70%   . Subarachnoid hemorrhage following injury ([place]) 03/31/2012  . Pancreatitis, acute 08/14/2013   Past Surgical History  Procedure Laterality Date  . Ankle fracture surgery  1960  . Abdominal hysterectomy  1942  . Closed reduction with humeral pin insertion  04/01/2012    Procedure: CLOSED REDUCTION WITH HUMERAL PIN INSERTION;  Surgeon: Schuyler Amor, MD;  Location: [facility];  Service: Orthopedics;  Laterality: Left;  . Bladder suspension      No Known Allergies    Medication List       This list is accurate as of: 09/21/15  4:18 PM.  Always use your most recent med list.               acetaminophen 500 MG tablet  Commonly known as:  TYLENOL  Take 500 mg by mouth every 6 (six) hours as needed. Pain     amLODipine 10 MG tablet  Commonly known as:  NORVASC  Take 10 mg by mouth daily.     aspirin EC 81 MG tablet  Take 81 mg by  mouth daily.     CERTAVITE/ANTIOXIDANTS PO  Take 1 tablet by mouth every morning.     PRESERVISION AREDS 2 PO  Take 1 capsule by mouth 2 (two) times daily.     famotidine 20 MG tablet  Commonly known as:  PEPCID  Take 20 mg by mouth at bedtime.     levothyroxine 50 MCG tablet  Commonly known as:  SYNTHROID, LEVOTHROID  Take 50 mcg by mouth daily before breakfast.     LORazepam 1 MG tablet  Commonly known as:  ATIVAN  Take 1 tablet by mouth prior  to showers as needed and for general anxiety     losartan 100 MG tablet  Commonly known as:  COZAAR  Take 100 mg by mouth daily.     metoprolol tartrate 25 MG tablet  Commonly known as:  LOPRESSOR  Take 75 mg by mouth 2 (two) times daily.     raloxifene 60 MG tablet  Commonly known as:  EVISTA  Take 60 mg by mouth daily.     senna-docusate 8.6-50 MG tablet  Commonly known as:  Senokot-S  Take 2 tablets by mouth at bedtime.     traMADol 50 MG tablet  Commonly known as:  ULTRAM  Take 50 mg by mouth every 6 (six) hours as needed.     traZODone 50 MG tablet  Commonly known as:  DESYREL  Take 25 mg by mouth at bedtime.     Vitamin D-3 1000 units Caps  Take 1,000 Units by mouth every morning.        Review of Systems  Constitutional: Negative for fever, chills and diaphoresis.  HENT: Positive for hearing loss. Negative for congestion, ear discharge, ear pain and tinnitus.   Eyes: Negative for pain, discharge and redness.  Respiratory: Negative for cough, shortness of breath and wheezing.   Cardiovascular: Positive for leg swelling. Negative for chest pain and palpitations.       Trace in ankles.   Gastrointestinal: Negative for nausea, vomiting, abdominal pain, diarrhea and constipation.  Genitourinary: Positive for frequency. Negative for dysuria, urgency, hematuria and flank pain.  Musculoskeletal: Positive for back pain. Negative for myalgias and neck pain.       Lower back across R+L SIJ-better since Norco bid.   Skin: Negative for rash.  Neurological: Negative for dizziness, tremors, seizures, weakness and headaches.  Psychiatric/Behavioral: Negative for suicidal ideas and hallucinations. The patient is not nervous/anxious.     Immunization History  Administered Date(s) Administered  . Influenza-Unspecified 03/02/2013, 02/15/2014, 02/16/2015  . PPD Test 03/23/2012   Pertinent  Health Maintenance Due  Topic Date Due  . DEXA SCAN  12/29/1983  . PNA vac Low Risk  Adult (1 of 2 - PCV13) 12/29/1983  . INFLUENZA VACCINE  12/12/2015   Fall Risk  02/07/2015 02/22/2014 02/22/2014  Falls in the past year? No Yes No  Number falls in past yr: - 1 -  Risk for fall due to : - History of fall(s) -   Functional Status Survey:    Filed Vitals:   09/21/15 0810  BP: 122/75  Pulse: 63  Temp: 97.8 F (36.6 C)  TempSrc: Oral  Resp: 18  Height: 5\' 1"  (1.549 m)  Weight: 154 lb (69.854 kg)   Body mass index is 29.11 kg/(m^2). Physical Exam  Constitutional: She is oriented to person, place, and time. She appears well-developed and well-nourished.  HENT:  Head: Normocephalic and atraumatic.  Eyes: Conjunctivae and EOM are normal. Pupils are equal,  round, and reactive to light.  Neck: Normal range of motion. Neck supple.  Cardiovascular:  No murmur heard. Pulmonary/Chest: Effort normal and breath sounds normal. No respiratory distress. She has no wheezes. She has no rales. She exhibits no tenderness.  Abdominal: Soft. Bowel sounds are normal. She exhibits no distension and no mass. There is no tenderness. There is no rebound and no guarding.  Musculoskeletal: Normal range of motion. She exhibits tenderness. She exhibits no edema.  Unsteady gait. Lower back across R+L SIJ-better managed with Norco bid   Neurological: She is alert and oriented to person, place, and time. She displays normal reflexes. No cranial nerve deficit. She exhibits normal muscle tone. Coordination normal.  Skin: Skin is warm and dry. No rash noted. No erythema.  Psychiatric: Her behavior is normal. Judgment and thought content normal. Her mood appears not anxious. Her affect is not angry, not labile and not inappropriate. Her speech is not rapid and/or pressured, not delayed and not slurred. She is not agitated, not aggressive, not slowed and not withdrawn. Thought content is not paranoid and not delusional. Cognition and memory are impaired. She does not express impulsivity or inappropriate  judgment. Depressed: flat affect. She exhibits abnormal recent memory.    Labs reviewed:  Recent Labs  02/09/15 08/16/15 1128 08/21/15  NA 138 138 140  K 4.0 3.8 4.3  CL  --  103  --   CO2  --  23  --   GLUCOSE  --  197*  --   BUN 14 18 17   CREATININE 0.6 0.82 0.5  CALCIUM  --  9.4  --     Recent Labs  08/21/15  AST 15  ALT 19  ALKPHOS 53    Recent Labs  08/16/15 1128 08/21/15  WBC 6.8 6.4  NEUTROABS 4.1  --   HGB 14.2 13.8  HCT 43.4 41  MCV 98.9  --   PLT 173 172   Lab Results  Component Value Date   TSH 4.72 02/09/2015   Lab Results  Component Value Date   HGBA1C 6.0* 08/14/2013   No results found for: CHOL, HDL, LDLCALC, LDLDIRECT, TRIG, CHOLHDL  Significant Diagnostic Results in last 30 days:  No results found.  Assessment/Plan  Constipation Stable, continue Senokot S II nightly.    Fracture, vertebra, pathologic Better lower back pain, ambulates with walker, continue Tramadol 50mg  q6h prn    GERD (gastroesophageal reflux disease) Stable, continue Famotidine 20mg  daily   Hypertension Controlled, continue Amlodipine 10mg , Losartan 100mg , Metoprolol 25mg  bid, 08/21/15 Na 140, K 4.3, Bun 17, creat 0.50    Hyponatremia 08/21/15 Na 140   Hypothyroidism Continue Levothyroxine 22mcg, TSH 2.76 02/25/14, 02/10/15 TSH 4.719  Insomnia Stable, continue Trazodone 25mg  daily.    Lower back pain 07/19/14 X-ray lumbar spine and pelvis: T11 subacute process with more than 70% height loss 07/26/14 managing pain with Norco bid.  Pain is reasonably controlled. Continue prn Tramadol 50mg  q6h   Memory deficit Still functioning adequately in AL      Family/ staff Communication: continue AL for care needs.   Labs/tests ordered: none

## 2015-09-21 NOTE — Assessment & Plan Note (Signed)
08/21/15 Na 140

## 2015-09-21 NOTE — Assessment & Plan Note (Signed)
Stable, continue Trazodone 25mg  daily.

## 2015-09-21 NOTE — Assessment & Plan Note (Signed)
Stable, continue Senokot S II nightly.  

## 2015-09-21 NOTE — Assessment & Plan Note (Signed)
Better lower back pain, ambulates with walker, continue Tramadol 50mg  q6h prn

## 2015-09-21 NOTE — Assessment & Plan Note (Addendum)
Controlled, continue Amlodipine 10mg , Losartan 100mg , Metoprolol 25mg  bid, 08/21/15 Na 140, K 4.3, Bun 17, creat 0.50

## 2015-09-22 DIAGNOSIS — R296 Repeated falls: Secondary | ICD-10-CM | POA: Diagnosis not present

## 2015-09-22 DIAGNOSIS — R2681 Unsteadiness on feet: Secondary | ICD-10-CM | POA: Diagnosis not present

## 2015-09-22 DIAGNOSIS — I10 Essential (primary) hypertension: Secondary | ICD-10-CM | POA: Diagnosis not present

## 2015-09-22 DIAGNOSIS — M6281 Muscle weakness (generalized): Secondary | ICD-10-CM | POA: Diagnosis not present

## 2015-09-22 DIAGNOSIS — S066X0A Traumatic subarachnoid hemorrhage without loss of consciousness, initial encounter: Secondary | ICD-10-CM | POA: Diagnosis not present

## 2015-09-22 DIAGNOSIS — R41841 Cognitive communication deficit: Secondary | ICD-10-CM | POA: Diagnosis not present

## 2015-09-25 DIAGNOSIS — R41841 Cognitive communication deficit: Secondary | ICD-10-CM | POA: Diagnosis not present

## 2015-09-25 DIAGNOSIS — S066X0A Traumatic subarachnoid hemorrhage without loss of consciousness, initial encounter: Secondary | ICD-10-CM | POA: Diagnosis not present

## 2015-09-25 DIAGNOSIS — I10 Essential (primary) hypertension: Secondary | ICD-10-CM | POA: Diagnosis not present

## 2015-09-25 DIAGNOSIS — R2681 Unsteadiness on feet: Secondary | ICD-10-CM | POA: Diagnosis not present

## 2015-09-25 DIAGNOSIS — M6281 Muscle weakness (generalized): Secondary | ICD-10-CM | POA: Diagnosis not present

## 2015-09-25 DIAGNOSIS — R296 Repeated falls: Secondary | ICD-10-CM | POA: Diagnosis not present

## 2015-09-26 DIAGNOSIS — I10 Essential (primary) hypertension: Secondary | ICD-10-CM | POA: Diagnosis not present

## 2015-09-26 DIAGNOSIS — S066X0A Traumatic subarachnoid hemorrhage without loss of consciousness, initial encounter: Secondary | ICD-10-CM | POA: Diagnosis not present

## 2015-09-26 DIAGNOSIS — R296 Repeated falls: Secondary | ICD-10-CM | POA: Diagnosis not present

## 2015-09-26 DIAGNOSIS — M6281 Muscle weakness (generalized): Secondary | ICD-10-CM | POA: Diagnosis not present

## 2015-09-26 DIAGNOSIS — R41841 Cognitive communication deficit: Secondary | ICD-10-CM | POA: Diagnosis not present

## 2015-09-26 DIAGNOSIS — R2681 Unsteadiness on feet: Secondary | ICD-10-CM | POA: Diagnosis not present

## 2015-09-28 DIAGNOSIS — M6281 Muscle weakness (generalized): Secondary | ICD-10-CM | POA: Diagnosis not present

## 2015-09-28 DIAGNOSIS — I10 Essential (primary) hypertension: Secondary | ICD-10-CM | POA: Diagnosis not present

## 2015-09-28 DIAGNOSIS — R296 Repeated falls: Secondary | ICD-10-CM | POA: Diagnosis not present

## 2015-09-28 DIAGNOSIS — R41841 Cognitive communication deficit: Secondary | ICD-10-CM | POA: Diagnosis not present

## 2015-09-28 DIAGNOSIS — R2681 Unsteadiness on feet: Secondary | ICD-10-CM | POA: Diagnosis not present

## 2015-09-28 DIAGNOSIS — S066X0A Traumatic subarachnoid hemorrhage without loss of consciousness, initial encounter: Secondary | ICD-10-CM | POA: Diagnosis not present

## 2015-10-02 DIAGNOSIS — R2681 Unsteadiness on feet: Secondary | ICD-10-CM | POA: Diagnosis not present

## 2015-10-02 DIAGNOSIS — M6281 Muscle weakness (generalized): Secondary | ICD-10-CM | POA: Diagnosis not present

## 2015-10-02 DIAGNOSIS — S066X0A Traumatic subarachnoid hemorrhage without loss of consciousness, initial encounter: Secondary | ICD-10-CM | POA: Diagnosis not present

## 2015-10-02 DIAGNOSIS — R296 Repeated falls: Secondary | ICD-10-CM | POA: Diagnosis not present

## 2015-10-02 DIAGNOSIS — R41841 Cognitive communication deficit: Secondary | ICD-10-CM | POA: Diagnosis not present

## 2015-10-02 DIAGNOSIS — I10 Essential (primary) hypertension: Secondary | ICD-10-CM | POA: Diagnosis not present

## 2015-10-03 ENCOUNTER — Encounter: Payer: Self-pay | Admitting: *Deleted

## 2015-10-03 DIAGNOSIS — R296 Repeated falls: Secondary | ICD-10-CM | POA: Diagnosis not present

## 2015-10-03 DIAGNOSIS — R41841 Cognitive communication deficit: Secondary | ICD-10-CM | POA: Diagnosis not present

## 2015-10-03 DIAGNOSIS — S066X0A Traumatic subarachnoid hemorrhage without loss of consciousness, initial encounter: Secondary | ICD-10-CM | POA: Diagnosis not present

## 2015-10-03 DIAGNOSIS — M6281 Muscle weakness (generalized): Secondary | ICD-10-CM | POA: Diagnosis not present

## 2015-10-03 DIAGNOSIS — R2681 Unsteadiness on feet: Secondary | ICD-10-CM | POA: Diagnosis not present

## 2015-10-03 DIAGNOSIS — I10 Essential (primary) hypertension: Secondary | ICD-10-CM | POA: Diagnosis not present

## 2015-10-04 DIAGNOSIS — S066X0A Traumatic subarachnoid hemorrhage without loss of consciousness, initial encounter: Secondary | ICD-10-CM | POA: Diagnosis not present

## 2015-10-04 DIAGNOSIS — I10 Essential (primary) hypertension: Secondary | ICD-10-CM | POA: Diagnosis not present

## 2015-10-04 DIAGNOSIS — R296 Repeated falls: Secondary | ICD-10-CM | POA: Diagnosis not present

## 2015-10-04 DIAGNOSIS — M6281 Muscle weakness (generalized): Secondary | ICD-10-CM | POA: Diagnosis not present

## 2015-10-04 DIAGNOSIS — R2681 Unsteadiness on feet: Secondary | ICD-10-CM | POA: Diagnosis not present

## 2015-10-04 DIAGNOSIS — R41841 Cognitive communication deficit: Secondary | ICD-10-CM | POA: Diagnosis not present

## 2015-10-05 DIAGNOSIS — R296 Repeated falls: Secondary | ICD-10-CM | POA: Diagnosis not present

## 2015-10-05 DIAGNOSIS — I10 Essential (primary) hypertension: Secondary | ICD-10-CM | POA: Diagnosis not present

## 2015-10-05 DIAGNOSIS — M6281 Muscle weakness (generalized): Secondary | ICD-10-CM | POA: Diagnosis not present

## 2015-10-05 DIAGNOSIS — S066X0A Traumatic subarachnoid hemorrhage without loss of consciousness, initial encounter: Secondary | ICD-10-CM | POA: Diagnosis not present

## 2015-10-05 DIAGNOSIS — R41841 Cognitive communication deficit: Secondary | ICD-10-CM | POA: Diagnosis not present

## 2015-10-05 DIAGNOSIS — R2681 Unsteadiness on feet: Secondary | ICD-10-CM | POA: Diagnosis not present

## 2015-10-10 DIAGNOSIS — R296 Repeated falls: Secondary | ICD-10-CM | POA: Diagnosis not present

## 2015-10-10 DIAGNOSIS — M6281 Muscle weakness (generalized): Secondary | ICD-10-CM | POA: Diagnosis not present

## 2015-10-10 DIAGNOSIS — S066X0A Traumatic subarachnoid hemorrhage without loss of consciousness, initial encounter: Secondary | ICD-10-CM | POA: Diagnosis not present

## 2015-10-10 DIAGNOSIS — R2681 Unsteadiness on feet: Secondary | ICD-10-CM | POA: Diagnosis not present

## 2015-10-10 DIAGNOSIS — R41841 Cognitive communication deficit: Secondary | ICD-10-CM | POA: Diagnosis not present

## 2015-10-10 DIAGNOSIS — I10 Essential (primary) hypertension: Secondary | ICD-10-CM | POA: Diagnosis not present

## 2015-10-11 DIAGNOSIS — I10 Essential (primary) hypertension: Secondary | ICD-10-CM | POA: Diagnosis not present

## 2015-10-11 DIAGNOSIS — R41841 Cognitive communication deficit: Secondary | ICD-10-CM | POA: Diagnosis not present

## 2015-10-11 DIAGNOSIS — R296 Repeated falls: Secondary | ICD-10-CM | POA: Diagnosis not present

## 2015-10-11 DIAGNOSIS — M6281 Muscle weakness (generalized): Secondary | ICD-10-CM | POA: Diagnosis not present

## 2015-10-11 DIAGNOSIS — S066X0A Traumatic subarachnoid hemorrhage without loss of consciousness, initial encounter: Secondary | ICD-10-CM | POA: Diagnosis not present

## 2015-10-11 DIAGNOSIS — R2681 Unsteadiness on feet: Secondary | ICD-10-CM | POA: Diagnosis not present

## 2015-10-13 DIAGNOSIS — M25579 Pain in unspecified ankle and joints of unspecified foot: Secondary | ICD-10-CM | POA: Diagnosis not present

## 2015-10-13 DIAGNOSIS — M545 Low back pain: Secondary | ICD-10-CM | POA: Diagnosis not present

## 2015-10-13 DIAGNOSIS — M6281 Muscle weakness (generalized): Secondary | ICD-10-CM | POA: Diagnosis not present

## 2015-10-13 DIAGNOSIS — I10 Essential (primary) hypertension: Secondary | ICD-10-CM | POA: Diagnosis not present

## 2015-10-13 DIAGNOSIS — G4700 Insomnia, unspecified: Secondary | ICD-10-CM | POA: Diagnosis not present

## 2015-10-13 DIAGNOSIS — H3530 Unspecified macular degeneration: Secondary | ICD-10-CM | POA: Diagnosis not present

## 2015-10-13 DIAGNOSIS — M159 Polyosteoarthritis, unspecified: Secondary | ICD-10-CM | POA: Diagnosis not present

## 2015-10-13 DIAGNOSIS — S066X0A Traumatic subarachnoid hemorrhage without loss of consciousness, initial encounter: Secondary | ICD-10-CM | POA: Diagnosis not present

## 2015-10-13 DIAGNOSIS — R296 Repeated falls: Secondary | ICD-10-CM | POA: Diagnosis not present

## 2015-10-13 DIAGNOSIS — H35059 Retinal neovascularization, unspecified, unspecified eye: Secondary | ICD-10-CM | POA: Diagnosis not present

## 2015-10-13 DIAGNOSIS — W19XXXA Unspecified fall, initial encounter: Secondary | ICD-10-CM | POA: Diagnosis not present

## 2015-10-13 DIAGNOSIS — H9190 Unspecified hearing loss, unspecified ear: Secondary | ICD-10-CM | POA: Diagnosis not present

## 2015-10-13 DIAGNOSIS — S42409A Unspecified fracture of lower end of unspecified humerus, initial encounter for closed fracture: Secondary | ICD-10-CM | POA: Diagnosis not present

## 2015-10-13 DIAGNOSIS — R41841 Cognitive communication deficit: Secondary | ICD-10-CM | POA: Diagnosis not present

## 2015-10-13 DIAGNOSIS — R2681 Unsteadiness on feet: Secondary | ICD-10-CM | POA: Diagnosis not present

## 2015-10-13 DIAGNOSIS — M810 Age-related osteoporosis without current pathological fracture: Secondary | ICD-10-CM | POA: Diagnosis not present

## 2015-10-17 DIAGNOSIS — R296 Repeated falls: Secondary | ICD-10-CM | POA: Diagnosis not present

## 2015-10-17 DIAGNOSIS — S066X0A Traumatic subarachnoid hemorrhage without loss of consciousness, initial encounter: Secondary | ICD-10-CM | POA: Diagnosis not present

## 2015-10-17 DIAGNOSIS — R41841 Cognitive communication deficit: Secondary | ICD-10-CM | POA: Diagnosis not present

## 2015-10-17 DIAGNOSIS — M6281 Muscle weakness (generalized): Secondary | ICD-10-CM | POA: Diagnosis not present

## 2015-10-17 DIAGNOSIS — R2681 Unsteadiness on feet: Secondary | ICD-10-CM | POA: Diagnosis not present

## 2015-10-17 DIAGNOSIS — I10 Essential (primary) hypertension: Secondary | ICD-10-CM | POA: Diagnosis not present

## 2015-10-19 ENCOUNTER — Encounter: Payer: Self-pay | Admitting: Internal Medicine

## 2015-10-19 ENCOUNTER — Telehealth: Payer: Self-pay

## 2015-10-19 DIAGNOSIS — S066X0A Traumatic subarachnoid hemorrhage without loss of consciousness, initial encounter: Secondary | ICD-10-CM | POA: Diagnosis not present

## 2015-10-19 DIAGNOSIS — R2681 Unsteadiness on feet: Secondary | ICD-10-CM | POA: Diagnosis not present

## 2015-10-19 DIAGNOSIS — R296 Repeated falls: Secondary | ICD-10-CM | POA: Diagnosis not present

## 2015-10-19 DIAGNOSIS — M6281 Muscle weakness (generalized): Secondary | ICD-10-CM | POA: Diagnosis not present

## 2015-10-19 DIAGNOSIS — R41841 Cognitive communication deficit: Secondary | ICD-10-CM | POA: Diagnosis not present

## 2015-10-19 DIAGNOSIS — I10 Essential (primary) hypertension: Secondary | ICD-10-CM | POA: Diagnosis not present

## 2015-10-19 NOTE — Telephone Encounter (Signed)
Dr. Jake Bathe Tardif called and  said she needs a letter from you stating about her mother's dementia. Please Advise

## 2015-10-19 NOTE — Telephone Encounter (Signed)
I composed and printed the letter. I do not know which printer it went to. You can use may stamp or I can sign it next week.

## 2015-10-19 NOTE — Telephone Encounter (Signed)
Called spoke with patients daughter informed her that the letter was printed and in office and would she like it signed or would doctors stamp be all right. She said it needed to be signed by doctor could not use his stamp, letter put in his folder for signing.

## 2015-10-20 DIAGNOSIS — M6281 Muscle weakness (generalized): Secondary | ICD-10-CM | POA: Diagnosis not present

## 2015-10-20 DIAGNOSIS — S066X0A Traumatic subarachnoid hemorrhage without loss of consciousness, initial encounter: Secondary | ICD-10-CM | POA: Diagnosis not present

## 2015-10-20 DIAGNOSIS — I10 Essential (primary) hypertension: Secondary | ICD-10-CM | POA: Diagnosis not present

## 2015-10-20 DIAGNOSIS — R296 Repeated falls: Secondary | ICD-10-CM | POA: Diagnosis not present

## 2015-10-20 DIAGNOSIS — R2681 Unsteadiness on feet: Secondary | ICD-10-CM | POA: Diagnosis not present

## 2015-10-20 DIAGNOSIS — R41841 Cognitive communication deficit: Secondary | ICD-10-CM | POA: Diagnosis not present

## 2015-10-23 DIAGNOSIS — S066X0A Traumatic subarachnoid hemorrhage without loss of consciousness, initial encounter: Secondary | ICD-10-CM | POA: Diagnosis not present

## 2015-10-23 DIAGNOSIS — R41841 Cognitive communication deficit: Secondary | ICD-10-CM | POA: Diagnosis not present

## 2015-10-23 DIAGNOSIS — I10 Essential (primary) hypertension: Secondary | ICD-10-CM | POA: Diagnosis not present

## 2015-10-23 DIAGNOSIS — R296 Repeated falls: Secondary | ICD-10-CM | POA: Diagnosis not present

## 2015-10-23 DIAGNOSIS — R2681 Unsteadiness on feet: Secondary | ICD-10-CM | POA: Diagnosis not present

## 2015-10-23 DIAGNOSIS — M6281 Muscle weakness (generalized): Secondary | ICD-10-CM | POA: Diagnosis not present

## 2015-10-27 DIAGNOSIS — I10 Essential (primary) hypertension: Secondary | ICD-10-CM | POA: Diagnosis not present

## 2015-10-27 DIAGNOSIS — M6281 Muscle weakness (generalized): Secondary | ICD-10-CM | POA: Diagnosis not present

## 2015-10-27 DIAGNOSIS — S066X0A Traumatic subarachnoid hemorrhage without loss of consciousness, initial encounter: Secondary | ICD-10-CM | POA: Diagnosis not present

## 2015-10-27 DIAGNOSIS — R2681 Unsteadiness on feet: Secondary | ICD-10-CM | POA: Diagnosis not present

## 2015-10-27 DIAGNOSIS — R296 Repeated falls: Secondary | ICD-10-CM | POA: Diagnosis not present

## 2015-10-27 DIAGNOSIS — R41841 Cognitive communication deficit: Secondary | ICD-10-CM | POA: Diagnosis not present

## 2015-10-31 DIAGNOSIS — R41841 Cognitive communication deficit: Secondary | ICD-10-CM | POA: Diagnosis not present

## 2015-10-31 DIAGNOSIS — R2681 Unsteadiness on feet: Secondary | ICD-10-CM | POA: Diagnosis not present

## 2015-10-31 DIAGNOSIS — R296 Repeated falls: Secondary | ICD-10-CM | POA: Diagnosis not present

## 2015-10-31 DIAGNOSIS — S066X0A Traumatic subarachnoid hemorrhage without loss of consciousness, initial encounter: Secondary | ICD-10-CM | POA: Diagnosis not present

## 2015-10-31 DIAGNOSIS — M6281 Muscle weakness (generalized): Secondary | ICD-10-CM | POA: Diagnosis not present

## 2015-10-31 DIAGNOSIS — I10 Essential (primary) hypertension: Secondary | ICD-10-CM | POA: Diagnosis not present

## 2015-12-08 ENCOUNTER — Non-Acute Institutional Stay: Payer: Medicare Other | Admitting: Nurse Practitioner

## 2015-12-08 ENCOUNTER — Encounter: Payer: Self-pay | Admitting: Nurse Practitioner

## 2015-12-08 DIAGNOSIS — I10 Essential (primary) hypertension: Secondary | ICD-10-CM

## 2015-12-08 DIAGNOSIS — K5900 Constipation, unspecified: Secondary | ICD-10-CM | POA: Diagnosis not present

## 2015-12-08 DIAGNOSIS — E871 Hypo-osmolality and hyponatremia: Secondary | ICD-10-CM

## 2015-12-08 DIAGNOSIS — R413 Other amnesia: Secondary | ICD-10-CM | POA: Diagnosis not present

## 2015-12-08 DIAGNOSIS — G4700 Insomnia, unspecified: Secondary | ICD-10-CM

## 2015-12-08 DIAGNOSIS — E039 Hypothyroidism, unspecified: Secondary | ICD-10-CM | POA: Diagnosis not present

## 2015-12-08 DIAGNOSIS — K219 Gastro-esophageal reflux disease without esophagitis: Secondary | ICD-10-CM | POA: Diagnosis not present

## 2015-12-08 NOTE — Assessment & Plan Note (Signed)
Continue Levothyroxine 64mcg, TSH 2.76 02/25/14, 02/10/15 TSH 4.719

## 2015-12-08 NOTE — Assessment & Plan Note (Signed)
Controlled, continue Amlodipine 10mg , Losartan 100mg , Metoprolol 25mg  bid, 08/21/15 Na 140, K 4.3, Bun 17, creat 0.50

## 2015-12-08 NOTE — Assessment & Plan Note (Signed)
08/21/15 Na 140

## 2015-12-08 NOTE — Progress Notes (Signed)
Location:   [place] Room Number: AL-26 Place of Service:  SNF (31) Provider:  Shaylea Ucci NP   Patient Care Team: Estill Dooms, MD as PCP - General (Internal Medicine) Markeya Mincy Otho Darner, NP as Nurse Practitioner (Nurse Practitioner) Charlotte Crumb, MD as Consulting Physician (Orthopedic Surgery) Wallene Huh, DPM as Consulting Physician (Podiatry) Gaynelle Arabian, MD as Consulting Physician (Orthopedic Surgery) Zebedee Iba, MD as Referring Physician (Ophthalmology) Newman Pies, MD as Consulting Physician (Neurosurgery) [facility] (Skilled Nursing and [place])  Extended Emergency Contact Information Primary Emergency Contact: [place] of [place] Phone: [phone] Mobile Phone: [phone] Relation: Daughter Secondary Emergency Contact: [place] of [place] Phone: [phone] Relation: Grandson  Code Status: DNR Goals of care: Advanced Directive information Advanced Directives 12/08/2015  Does patient have an advance directive? Yes  Type of Advance Directive Out of facility DNR (pink MOST or yellow form);Living will;Healthcare Power of Attorney  Does patient want to make changes to advanced directive? No - Patient declined  Copy of advanced directive(s) in chart? Yes  Pre-existing out of facility DNR order (yellow form or pink MOST form) -     Chief Complaint  Patient presents with  . Medical Management of Chronic Issues    HPI:  Pt is a 82 y.o. female seen today for medical management of chronic diseases.     Hx of GERD, stable while on Famotidine 20mg  daily, memory, resides in AL and functioning well, insomnia, sleeps with aid of Trazodone 25mg , back pain, better, ambulates with walker, prn Tramadol 50mg  q6h available to her, thyroid, taking Levothyroxine 96mcg, HTN, controlled on Amlodipine 10mg , Losartan 100mg , Metoprolol 25mg  bid. Not constipation, taking Senna II qhs.       Past Medical History:  Diagnosis Date  . Arthritis   . Cataracts, bilateral   . Closed fracture of unspecified part of lower end of humerus   . Hypertension   . Insomnia, unspecified   . Macular degeneration (senile) of retina, unspecified   . Osteoarthritis   . Osteoarthrosis, unspecified whether generalized or localized, unspecified site   . Pancreatitis, acute 08/14/2013  . Retinal neovascularization NOS   . Senile osteoporosis   . Subarachnoid hemorrhage following injury ([place]) 03/31/2012  . Unspecified hearing loss   . Unspecified hypothyroidism   . Urinary tract infection 08/10/2013  . Vertebral fracture 08/09/2013   T7 10%, T12 70%    Past Surgical History:  Procedure Laterality Date  . ABDOMINAL HYSTERECTOMY  1942  . ANKLE FRACTURE SURGERY  1960  . BLADDER SUSPENSION    . CLOSED REDUCTION WITH HUMERAL PIN INSERTION  04/01/2012   Procedure: CLOSED REDUCTION WITH HUMERAL PIN INSERTION;  Surgeon: Schuyler Amor, MD;  Location: [facility];  Service: Orthopedics;  Laterality: Left;    No Known Allergies    Medication List       Accurate as of 12/08/15  4:01 PM. Always use your most recent med list.          acetaminophen 500 MG tablet Commonly known as:  TYLENOL Take 500 mg by mouth every 6 (six) hours as needed. Pain   amLODipine 10 MG tablet Commonly known as:  NORVASC Take 10 mg by mouth daily.   aspirin EC 81 MG tablet Take 81 mg by mouth daily.   CERTAVITE/ANTIOXIDANTS PO Take 1 tablet by mouth every morning.   PRESERVISION AREDS 2 PO Take 1 capsule by mouth 2 (two) times  daily.   famotidine 20 MG tablet Commonly known as:  PEPCID Take 20 mg by mouth at bedtime.   levothyroxine 50 MCG tablet Commonly known as:  SYNTHROID, LEVOTHROID Take 50 mcg by mouth daily before breakfast.   LORazepam 1 MG tablet Commonly known as:  ATIVAN Take 1 tablet by mouth prior to showers as needed and for general anxiety   losartan 100 MG tablet Commonly  known as:  COZAAR Take 100 mg by mouth daily.   metoprolol tartrate 25 MG tablet Commonly known as:  LOPRESSOR Take 75 mg by mouth 2 (two) times daily.   raloxifene 60 MG tablet Commonly known as:  EVISTA Take 60 mg by mouth daily.   senna-docusate 8.6-50 MG tablet Commonly known as:  Senokot-S Take 2 tablets by mouth at bedtime.   traZODone 50 MG tablet Commonly known as:  DESYREL Take 25 mg by mouth at bedtime.   Vitamin D-3 1000 units Caps Take 1,000 Units by mouth every morning.       Review of Systems  Constitutional: Negative for chills, diaphoresis and fever.  HENT: Positive for hearing loss. Negative for congestion, ear discharge, ear pain and tinnitus.   Eyes: Negative for pain, discharge and redness.  Respiratory: Negative for cough, shortness of breath and wheezing.   Cardiovascular: Positive for leg swelling. Negative for chest pain and palpitations.       Trace in ankles.   Gastrointestinal: Negative for abdominal pain, constipation, diarrhea, nausea and vomiting.  Genitourinary: Positive for frequency. Negative for dysuria, flank pain, hematuria and urgency.  Musculoskeletal: Positive for back pain. Negative for myalgias and neck pain.       Lower back across R+L SIJ-better since Norco bid.   Skin: Negative for rash.  Neurological: Negative for dizziness, tremors, seizures, weakness and headaches.  Psychiatric/Behavioral: Negative for hallucinations and suicidal ideas. The patient is not nervous/anxious.     Immunization History  Administered Date(s) Administered  . Influenza-Unspecified 03/02/2013, 02/15/2014, 02/16/2015  . PPD Test 03/23/2012   Pertinent  Health Maintenance Due  Topic Date Due  . DEXA SCAN  12/29/1983  . PNA vac Low Risk Adult (1 of 2 - PCV13) 12/29/1983  . INFLUENZA VACCINE  12/12/2015   Fall Risk  02/07/2015 02/22/2014 02/22/2014  Falls in the past year? No Yes No  Number falls in past yr: - 1 -  Risk for fall due to : - History  of fall(s) -   Functional Status Survey:    Vitals:   12/08/15 1506  BP: (!) 147/81  Pulse: 70  Resp: (!) 23  Temp: (!) 96.6 F (35.9 C)  Weight: 158 lb 6.4 oz (71.8 kg)  Height: 5\' 1"  (1.549 m)   Body mass index is 29.93 kg/m. Physical Exam  Constitutional: She is oriented to person, place, and time. She appears well-developed and well-nourished.  HENT:  Head: Normocephalic and atraumatic.  Eyes: Conjunctivae and EOM are normal. Pupils are equal, round, and reactive to light.  Neck: Normal range of motion. Neck supple.  Cardiovascular:  No murmur heard. Pulmonary/Chest: Effort normal and breath sounds normal. No respiratory distress. She has no wheezes. She has no rales. She exhibits no tenderness.  Abdominal: Soft. Bowel sounds are normal. She exhibits no distension and no mass. There is no tenderness. There is no rebound and no guarding.  Musculoskeletal: Normal range of motion. She exhibits tenderness. She exhibits no edema.  Unsteady gait. Lower back across R+L SIJ-better managed with Norco bid   Neurological:  She is alert and oriented to person, place, and time. She displays normal reflexes. No cranial nerve deficit. She exhibits normal muscle tone. Coordination normal.  Skin: Skin is warm and dry. No rash noted. No erythema.  Psychiatric: Her behavior is normal. Judgment and thought content normal. Her mood appears not anxious. Her affect is not angry, not labile and not inappropriate. Her speech is not rapid and/or pressured, not delayed and not slurred. She is not agitated, not aggressive, not slowed and not withdrawn. Thought content is not paranoid and not delusional. Cognition and memory are impaired. She does not express impulsivity or inappropriate judgment. Depressed: flat affect. She exhibits abnormal recent memory.    Labs reviewed:  Recent Labs  02/09/15 08/16/15 1128 08/21/15  NA 138 138 140  K 4.0 3.8 4.3  CL  --  103  --   CO2  --  23  --   GLUCOSE  --   197*  --   BUN 14 18 17   CREATININE 0.6 0.82 0.5  CALCIUM  --  9.4  --     Recent Labs  08/21/15  AST 15  ALT 19  ALKPHOS 53    Recent Labs  08/16/15 1128 08/21/15  WBC 6.8 6.4  NEUTROABS 4.1  --   HGB 14.2 13.8  HCT 43.4 41  MCV 98.9  --   PLT 173 172   Lab Results  Component Value Date   TSH 3.31 08/21/2015   Lab Results  Component Value Date   HGBA1C 6.0 (H) 08/14/2013   No results found for: CHOL, HDL, LDLCALC, LDLDIRECT, TRIG, CHOLHDL  Significant Diagnostic Results in last 30 days:  No results found.  Assessment/Plan There are no diagnoses linked to this encounter.Hypertension Controlled, continue Amlodipine 10mg , Losartan 100mg , Metoprolol 25mg  bid, 08/21/15 Na 140, K 4.3, Bun 17, creat 0.50     Constipation Stable, continue Senokot S II nightly.     GERD (gastroesophageal reflux disease) Stable, continue Famotidine 20mg  daily    Hypothyroidism Continue Levothyroxine 48mcg, TSH 2.76 02/25/14, 02/10/15 TSH 4.719   Hyponatremia 08/21/15 Na 140   Insomnia Stable, continue Trazodone 25mg  daily.     Memory deficit Still functioning adequately in AL      Family/ staff Communication: continue AL for care assistance  Labs/tests ordered:  none

## 2015-12-08 NOTE — Assessment & Plan Note (Signed)
Stable, continue Senokot S II nightly.  

## 2015-12-08 NOTE — Assessment & Plan Note (Signed)
Stable, continue Famotidine 20mg daily.   

## 2015-12-08 NOTE — Assessment & Plan Note (Signed)
Still functioning adequately in AL

## 2015-12-08 NOTE — Assessment & Plan Note (Signed)
Stable, continue Trazodone 25mg  daily.

## 2015-12-26 DIAGNOSIS — Z85828 Personal history of other malignant neoplasm of skin: Secondary | ICD-10-CM | POA: Diagnosis not present

## 2015-12-26 DIAGNOSIS — D235 Other benign neoplasm of skin of trunk: Secondary | ICD-10-CM | POA: Diagnosis not present

## 2015-12-26 DIAGNOSIS — D1801 Hemangioma of skin and subcutaneous tissue: Secondary | ICD-10-CM | POA: Diagnosis not present

## 2015-12-26 DIAGNOSIS — L821 Other seborrheic keratosis: Secondary | ICD-10-CM | POA: Diagnosis not present

## 2016-01-04 ENCOUNTER — Encounter: Payer: Self-pay | Admitting: Internal Medicine

## 2016-01-04 ENCOUNTER — Non-Acute Institutional Stay: Payer: Medicare Other | Admitting: Internal Medicine

## 2016-01-04 DIAGNOSIS — I10 Essential (primary) hypertension: Secondary | ICD-10-CM

## 2016-01-04 DIAGNOSIS — R413 Other amnesia: Secondary | ICD-10-CM | POA: Diagnosis not present

## 2016-01-04 DIAGNOSIS — H9193 Unspecified hearing loss, bilateral: Secondary | ICD-10-CM

## 2016-01-04 DIAGNOSIS — R739 Hyperglycemia, unspecified: Secondary | ICD-10-CM | POA: Diagnosis not present

## 2016-01-04 DIAGNOSIS — N3946 Mixed incontinence: Secondary | ICD-10-CM

## 2016-01-04 DIAGNOSIS — R32 Unspecified urinary incontinence: Secondary | ICD-10-CM | POA: Insufficient documentation

## 2016-01-04 DIAGNOSIS — E039 Hypothyroidism, unspecified: Secondary | ICD-10-CM | POA: Diagnosis not present

## 2016-01-04 MED ORDER — METOPROLOL TARTRATE 100 MG PO TABS: ORAL_TABLET | ORAL | 11 refills | Status: DC

## 2016-01-04 NOTE — Progress Notes (Signed)
Progress Note    Location:   [place] Room Number: [place] of Service:  ALF [id number]) Provider:  Jeanmarie Hubert, MD  Patient Care Team: Estill Dooms, MD as PCP - General (Internal Medicine) Man Otho Darner, NP as Nurse Practitioner (Nurse Practitioner) Charlotte Crumb, MD as Consulting Physician (Orthopedic Surgery) Wallene Huh, DPM as Consulting Physician (Podiatry) Gaynelle Arabian, MD as Consulting Physician (Orthopedic Surgery) Zebedee Iba, MD as Referring Physician (Ophthalmology) Newman Pies, MD as Consulting Physician (Neurosurgery) [facility] (Skilled Nursing and [place])  Extended Emergency Contact Information Primary Emergency Contact: [place] of [place] Phone: [phone] Mobile Phone: [phone] Relation: Daughter Secondary Emergency Contact: [place] of [place] Phone: [phone] Relation: Grandson  Code Status:  DNR Goals of care: Advanced Directive information Advanced Directives 12/08/2015  Does patient have an advance directive? Yes  Type of Advance Directive Out of facility DNR (pink MOST or yellow form);Living will;Healthcare Power of Attorney  Does patient want to make changes to advanced directive? No - Patient declined  Copy of advanced directive(s) in chart? Yes  Pre-existing out of facility DNR order (yellow form or pink MOST form) -     Chief Complaint  Patient presents with  . Medical Management of Chronic Issues    routine     HPI:  Pt is a 82 y.o. female seen today for medical management of chronic diseases.     Past Medical History:  Diagnosis Date  . Arthritis   . Cataracts, bilateral   . Closed fracture of unspecified part of lower end of humerus   . Hypertension   . Insomnia, unspecified   . Macular degeneration (senile) of retina, unspecified   . Osteoarthritis   . Osteoarthrosis, unspecified whether generalized or  localized, unspecified site   . Pancreatitis, acute 08/14/2013  . Retinal neovascularization NOS   . Senile osteoporosis   . Subarachnoid hemorrhage following injury ([place]) 03/31/2012  . Unspecified hearing loss   . Unspecified hypothyroidism   . Urinary tract infection 08/10/2013  . Vertebral fracture 08/09/2013   T7 10%, T12 70%    Past Surgical History:  Procedure Laterality Date  . ABDOMINAL HYSTERECTOMY  1942  . ANKLE FRACTURE SURGERY  1960  . BLADDER SUSPENSION    . CLOSED REDUCTION WITH HUMERAL PIN INSERTION  04/01/2012   Procedure: CLOSED REDUCTION WITH HUMERAL PIN INSERTION;  Surgeon: Schuyler Amor, MD;  Location: [facility];  Service: Orthopedics;  Laterality: Left;    No Known Allergies    Medication List       Accurate as of 01/04/16 11:05 AM. Always use your most recent med list.          acetaminophen 500 MG tablet Commonly known as:  TYLENOL Take 500 mg by mouth every 6 (six) hours as needed. Pain   amLODipine 10 MG tablet Commonly known as:  NORVASC Take 10 mg by mouth daily.   aspirin EC 81 MG tablet Take 81 mg by mouth daily.   CERTAVITE/ANTIOXIDANTS PO Take 1 tablet by mouth every morning.   PRESERVISION AREDS 2 PO Take 1 capsule by mouth 2 (two) times daily.   famotidine 20 MG tablet Commonly known as:  PEPCID Take 20 mg by mouth at bedtime.   levothyroxine 50 MCG tablet Commonly known as:  SYNTHROID, LEVOTHROID Take 50 mcg by mouth daily before breakfast.   LORazepam 1 MG tablet Commonly known as:  ATIVAN Take  1 mg by mouth. Take one tablet daily as needed for anxiety   losartan 100 MG tablet Commonly known as:  COZAAR Take 100 mg by mouth daily.   metoprolol tartrate 25 MG tablet Commonly known as:  LOPRESSOR Take 75 mg by mouth 2 (two) times daily.   raloxifene 60 MG tablet Commonly known as:  EVISTA Take 60 mg by mouth daily.   senna-docusate 8.6-50 MG tablet Commonly known as:  Senokot-S Take 2 tablets by mouth at  bedtime.   traZODone 50 MG tablet Commonly known as:  DESYREL Take 25 mg by mouth at bedtime.   Vitamin D-3 1000 units Caps Take 1,000 Units by mouth every morning.       Review of Systems  Constitutional: Negative for activity change, appetite change, chills, diaphoresis, fatigue, fever and unexpected weight change.  HENT: Positive for hearing loss. Negative for congestion, ear discharge, postnasal drip, rhinorrhea, trouble swallowing and voice change.   Eyes: Negative for pain, discharge, itching and visual disturbance.  Respiratory: Negative for cough, choking, chest tightness, shortness of breath and wheezing.   Cardiovascular: Negative for chest pain, palpitations and leg swelling.  Gastrointestinal: Negative for abdominal pain, constipation, diarrhea, nausea and vomiting.  Endocrine: Negative for cold intolerance, heat intolerance, polydipsia, polyphagia and polyuria.       Compensated hypothyroidismism  Genitourinary: Negative for dysuria, flank pain, frequency and urgency.  Musculoskeletal: Positive for arthralgias (left arm), back pain (improved) and gait problem. Negative for neck pain and neck stiffness.  Skin: Negative for pallor, rash and wound.  Allergic/Immunologic: Negative.   Neurological: Negative for tremors, syncope, speech difficulty, weakness, numbness and headaches.  Hematological: Negative.   Psychiatric/Behavioral: Positive for confusion (occasionally) and sleep disturbance. Negative for agitation, behavioral problems, dysphoric mood and hallucinations. The patient is not nervous/anxious.     Immunization History  Administered Date(s) Administered  . Influenza-Unspecified 03/02/2013, 02/15/2014, 02/16/2015  . PPD Test 03/23/2012   Pertinent  Health Maintenance Due  Topic Date Due  . DEXA SCAN  12/29/1983  . PNA vac Low Risk Adult (1 of 2 - PCV13) 12/29/1983  . INFLUENZA VACCINE  12/12/2015   Fall Risk  02/07/2015 02/22/2014 02/22/2014  Falls in the  past year? No Yes No  Number falls in past yr: - 1 -  Risk for fall due to : - History of fall(s) -   Functional Status Survey:    Vitals:   01/04/16 1048  BP: (!) 178/91  Pulse: 82  Resp: 18  Temp: 97.3 F (36.3 C)  Weight: 158 lb (71.7 kg)  Height: 5\' 1"  (1.549 m)   Body mass index is 29.85 kg/m. Physical Exam  Constitutional: She appears well-developed and well-nourished.  HENT:  Head: Normocephalic and atraumatic.  Severe bilateral hearing loss  Eyes: Conjunctivae and EOM are normal. Pupils are equal, round, and reactive to light.  Neck: Normal range of motion. Neck supple.  Cardiovascular:  No murmur heard. Pulmonary/Chest: Effort normal and breath sounds normal. No respiratory distress. She has no wheezes. She has no rales. She exhibits no tenderness.  Abdominal: Soft. Bowel sounds are normal. She exhibits no distension and no mass. There is no tenderness. There is no rebound and no guarding.  Musculoskeletal: Normal range of motion. She exhibits tenderness. She exhibits no edema.  Unsteady gait. Lower back across R+L SIJ-better managed with Norco bid  Neurological: She is alert. She displays normal reflexes. No cranial nerve deficit. She exhibits normal muscle tone. Coordination normal.  Forgetful. Short-term memory  deficits.  Skin: Skin is warm and dry. No rash noted. No erythema.  Psychiatric: Her behavior is normal. Thought content normal. Her mood appears not anxious. Her affect is not angry, not labile and not inappropriate. Her speech is not rapid and/or pressured, not delayed and not slurred. She is not agitated, not aggressive, not slowed and not withdrawn. Thought content is not paranoid and not delusional. Cognition and memory are impaired. She does not express impulsivity or inappropriate judgment. Depressed: flat affect. She exhibits abnormal recent memory.    Labs reviewed:  Recent Labs  02/09/15 08/16/15 1128 08/21/15  NA 138 138 140  K 4.0 3.8 4.3   CL  --  103  --   CO2  --  23  --   GLUCOSE  --  197*  --   BUN 14 18 17   CREATININE 0.6 0.82 0.5  CALCIUM  --  9.4  --     Recent Labs  08/21/15  AST 15  ALT 19  ALKPHOS 53    Recent Labs  08/16/15 1128 08/21/15  WBC 6.8 6.4  NEUTROABS 4.1  --   HGB 14.2 13.8  HCT 43.4 41  MCV 98.9  --   PLT 173 172   Lab Results  Component Value Date   TSH 3.31 08/21/2015   Lab Results  Component Value Date   HGBA1C 6.0 (H) 08/14/2013    Assessment/Plan  1. Memory deficit Repeat MMSE  2. Essential hypertension Controlled except for some mild elevations in SBP -Change metoprolol to 100 mg twice daily -Continue amlodipine 10 mg daily and losartan 100 mg daily  3. Hyperglycemia -bmp, A1c  4. Hypothyroidism, unspecified hypothyroidism type compensated  5. Hard of hearing, bilateral unchanged  6. Mixed incontinence Denies dysuria

## 2016-01-08 DIAGNOSIS — R739 Hyperglycemia, unspecified: Secondary | ICD-10-CM | POA: Diagnosis not present

## 2016-01-08 LAB — BASIC METABOLIC PANEL
BUN: 16 mg/dL (ref 4–21)
Creatinine: 0.7 mg/dL (ref <=1.1)
GLUCOSE: 138 mg/dL
Potassium: 4.1 mmol/L (ref 3.4–5.3)
Sodium: 141 mmol/L (ref 137–147)

## 2016-01-08 LAB — HEMOGLOBIN A1C: HEMOGLOBIN A1C: 6.7

## 2016-01-09 ENCOUNTER — Other Ambulatory Visit: Payer: Self-pay | Admitting: *Deleted

## 2016-02-06 DIAGNOSIS — Z961 Presence of intraocular lens: Secondary | ICD-10-CM | POA: Diagnosis not present

## 2016-02-06 DIAGNOSIS — H35033 Hypertensive retinopathy, bilateral: Secondary | ICD-10-CM | POA: Diagnosis not present

## 2016-02-06 DIAGNOSIS — H31011 Macula scars of posterior pole (postinflammatory) (post-traumatic), right eye: Secondary | ICD-10-CM | POA: Diagnosis not present

## 2016-02-06 DIAGNOSIS — H353232 Exudative age-related macular degeneration, bilateral, with inactive choroidal neovascularization: Secondary | ICD-10-CM | POA: Diagnosis not present

## 2016-02-06 DIAGNOSIS — H43813 Vitreous degeneration, bilateral: Secondary | ICD-10-CM | POA: Diagnosis not present

## 2016-02-13 ENCOUNTER — Non-Acute Institutional Stay: Payer: Medicare Other | Admitting: Nurse Practitioner

## 2016-02-13 ENCOUNTER — Encounter: Payer: Self-pay | Admitting: Nurse Practitioner

## 2016-02-13 DIAGNOSIS — M5440 Lumbago with sciatica, unspecified side: Secondary | ICD-10-CM | POA: Diagnosis not present

## 2016-02-13 DIAGNOSIS — R413 Other amnesia: Secondary | ICD-10-CM

## 2016-02-13 DIAGNOSIS — I10 Essential (primary) hypertension: Secondary | ICD-10-CM

## 2016-02-13 DIAGNOSIS — G4700 Insomnia, unspecified: Secondary | ICD-10-CM | POA: Diagnosis not present

## 2016-02-13 DIAGNOSIS — E039 Hypothyroidism, unspecified: Secondary | ICD-10-CM | POA: Diagnosis not present

## 2016-02-13 DIAGNOSIS — K5900 Constipation, unspecified: Secondary | ICD-10-CM | POA: Diagnosis not present

## 2016-02-13 DIAGNOSIS — K219 Gastro-esophageal reflux disease without esophagitis: Secondary | ICD-10-CM

## 2016-02-13 NOTE — Assessment & Plan Note (Signed)
Stable, continue Senokot S II nightly.  

## 2016-02-13 NOTE — Assessment & Plan Note (Signed)
Stable, continue Famotidine 20mg daily.   

## 2016-02-13 NOTE — Assessment & Plan Note (Signed)
Stable, continue Trazodone 25mg daily. Off prn Ativan 12/28/15 

## 2016-02-13 NOTE — Assessment & Plan Note (Signed)
Continue Levothyroxine 31mcg, TSH 3.31 08/21/15

## 2016-02-13 NOTE — Assessment & Plan Note (Addendum)
Controlled, continue Amlodipine 10mg , Losartan 100mg , Metoprolol 25mg  bid, 01/08/16 Na 141, K 4.1, Bun 16, creat 0.7

## 2016-02-13 NOTE — Assessment & Plan Note (Signed)
Still functioning adequately in AL

## 2016-02-13 NOTE — Progress Notes (Signed)
Location:  [place] Room Number: 26 Place of Service:  ALF [phone]) Provider:  Socorro Kanitz, Manxie   NP  Jeanmarie Hubert, MD  Patient Care Team: Estill Dooms, MD as PCP - General (Internal Medicine) Media Pizzini Otho Darner, NP as Nurse Practitioner (Nurse Practitioner) Charlotte Crumb, MD as Consulting Physician (Orthopedic Surgery) Wallene Huh, DPM as Consulting Physician (Podiatry) Gaynelle Arabian, MD as Consulting Physician (Orthopedic Surgery) Zebedee Iba, MD as Referring Physician (Ophthalmology) Newman Pies, MD as Consulting Physician (Neurosurgery) [facility] (Skilled Nursing and [place])  Extended Emergency Contact Information Primary Emergency Contact: [place] of [place] Phone: [phone] Mobile Phone: [phone] Relation: Daughter Secondary Emergency Contact: [place] of [place] Phone: [phone] Relation: Grandson  Code Status:  DNR Goals of care: Advanced Directive information Advanced Directives 02/13/2016  Does patient have an advance directive? Yes  Type of Paramedic of [place];Living will;Out of facility DNR (pink MOST or yellow form)  Does patient want to make changes to advanced directive? No - Patient declined  Copy of advanced directive(s) in chart? Yes  Pre-existing out of facility DNR order (yellow form or pink MOST form) -     Chief Complaint  Patient presents with  . Medical Management of Chronic Issues    HPI:  Pt is a 82 y.o. female seen today for medical management of chronic diseases.      Hx of GERD, stable while on Famotidine 20mg  daily, memory, resides in AL and functioning well, insomnia, sleeps with aid of Trazodone 25mg , back pain, better, ambulates with walker, prn Tramadol 50mg  q6h available to her, thyroid, taking Levothyroxine 35mcg, HTN, controlled on Amlodipine 10mg , Losartan 100mg , Metoprolol 25mg  bid. Not constipation,  taking Senna II qhs.  Past Medical History:  Diagnosis Date  . Arthritis   . Cataracts, bilateral   . Closed fracture of unspecified part of lower end of humerus   . Hypertension   . Insomnia, unspecified   . Macular degeneration (senile) of retina, unspecified   . Osteoarthritis   . Osteoarthrosis, unspecified whether generalized or localized, unspecified site   . Pancreatitis, acute 08/14/2013  . Retinal neovascularization NOS   . Senile osteoporosis   . Subarachnoid hemorrhage following injury ([place]) 03/31/2012  . Unspecified hearing loss   . Unspecified hypothyroidism   . Urinary tract infection 08/10/2013  . Vertebral fracture 08/09/2013   T7 10%, T12 70%    Past Surgical History:  Procedure Laterality Date  . ABDOMINAL HYSTERECTOMY  1942  . ANKLE FRACTURE SURGERY  1960  . BLADDER SUSPENSION    . CLOSED REDUCTION WITH HUMERAL PIN INSERTION  04/01/2012   Procedure: CLOSED REDUCTION WITH HUMERAL PIN INSERTION;  Surgeon: Schuyler Amor, MD;  Location: [facility];  Service: Orthopedics;  Laterality: Left;    No Known Allergies    Medication List       Accurate as of 02/13/16  4:31 PM. Always use your most recent med list.          acetaminophen 500 MG tablet Commonly known as:  TYLENOL Take 500 mg by mouth every 6 (six) hours as needed. Pain   amLODipine 10 MG tablet Commonly known as:  NORVASC Take 10 mg by mouth daily.   aspirin EC 81 MG tablet Take 81 mg by mouth daily.   CERTAVITE/ANTIOXIDANTS PO Take 1 tablet by mouth every morning.   PRESERVISION AREDS 2 PO Take 1 capsule by mouth 2 (  two) times daily.   PRESERVISION AREDS Tabs Take 2 tablets by mouth 2 (two) times daily with a meal.   famotidine 20 MG tablet Commonly known as:  PEPCID Take 20 mg by mouth at bedtime.   levothyroxine 50 MCG tablet Commonly known as:  SYNTHROID, LEVOTHROID Take 50 mcg by mouth daily before breakfast.   losartan 100 MG tablet Commonly known as:  COZAAR Take 100 mg  by mouth daily.   metoprolol 100 MG tablet Commonly known as:  LOPRESSOR Take 1 tablet twice daily to help control blood pressure   raloxifene 60 MG tablet Commonly known as:  EVISTA Take 60 mg by mouth daily.   senna-docusate 8.6-50 MG tablet Commonly known as:  Senokot-S Take 2 tablets by mouth at bedtime.   traZODone 50 MG tablet Commonly known as:  DESYREL Take 25 mg by mouth at bedtime.   Vitamin D-3 1000 units Caps Take 1,000 Units by mouth every morning.       Review of Systems  Constitutional: Negative for activity change, appetite change, chills, diaphoresis, fatigue, fever and unexpected weight change.  HENT: Positive for hearing loss. Negative for congestion, ear discharge, postnasal drip, rhinorrhea, trouble swallowing and voice change.   Eyes: Negative for pain, discharge, itching and visual disturbance.  Respiratory: Negative for cough, choking, chest tightness, shortness of breath and wheezing.   Cardiovascular: Negative for chest pain, palpitations and leg swelling.  Gastrointestinal: Negative for abdominal pain, constipation, diarrhea, nausea and vomiting.  Endocrine: Negative for cold intolerance, heat intolerance, polydipsia, polyphagia and polyuria.       Compensated hypothyroidismism  Genitourinary: Negative for dysuria, flank pain, frequency and urgency.  Musculoskeletal: Positive for arthralgias (left arm), back pain (improved) and gait problem. Negative for neck pain and neck stiffness.  Skin: Negative for pallor, rash and wound.  Allergic/Immunologic: Negative.   Neurological: Negative for tremors, syncope, speech difficulty, weakness, numbness and headaches.  Hematological: Negative.   Psychiatric/Behavioral: Positive for confusion (occasionally) and sleep disturbance. Negative for agitation, behavioral problems, dysphoric mood and hallucinations. The patient is not nervous/anxious.     Immunization History  Administered Date(s) Administered  .  Influenza-Unspecified 03/02/2013, 02/15/2014, 02/16/2015  . PPD Test 03/23/2012   Pertinent  Health Maintenance Due  Topic Date Due  . DEXA SCAN  12/29/1983  . PNA vac Low Risk Adult (1 of 2 - PCV13) 12/29/1983  . INFLUENZA VACCINE  12/12/2015   Fall Risk  02/07/2015 02/22/2014 02/22/2014  Falls in the past year? No Yes No  Number falls in past yr: - 1 -  Risk for fall due to : - History of fall(s) -   Functional Status Survey:    Vitals:   02/13/16 1509  BP: 137/71  Pulse: 66  Resp: 18  Temp: 98.2 F (36.8 C)  Weight: 157 lb 6.4 oz (71.4 kg)  Height: 5\' 1"  (1.549 m)   Body mass index is 29.74 kg/m. Physical Exam  Constitutional: She appears well-developed and well-nourished.  HENT:  Head: Normocephalic and atraumatic.  Severe bilateral hearing loss  Eyes: Conjunctivae and EOM are normal. Pupils are equal, round, and reactive to light.  Neck: Normal range of motion. Neck supple.  Cardiovascular:  No murmur heard. Pulmonary/Chest: Effort normal and breath sounds normal. No respiratory distress. She has no wheezes. She has no rales. She exhibits no tenderness.  Abdominal: Soft. Bowel sounds are normal. She exhibits no distension and no mass. There is no tenderness. There is no rebound and no guarding.  Musculoskeletal:  Normal range of motion. She exhibits tenderness. She exhibits no edema.  Unsteady gait. Lower back across R+L SIJ-better managed with Norco bid  Neurological: She is alert. She displays normal reflexes. No cranial nerve deficit. She exhibits normal muscle tone. Coordination normal.  Forgetful. Short-term memory deficits.  Skin: Skin is warm and dry. No rash noted. No erythema.  Psychiatric: Her behavior is normal. Thought content normal. Her mood appears not anxious. Her affect is not angry, not labile and not inappropriate. Her speech is not rapid and/or pressured, not delayed and not slurred. She is not agitated, not aggressive, not slowed and not withdrawn.  Thought content is not paranoid and not delusional. Cognition and memory are impaired. She does not express impulsivity or inappropriate judgment. Depressed: flat affect. She exhibits abnormal recent memory.    Labs reviewed:  Recent Labs  08/16/15 1128 08/21/15 01/08/16  NA 138 140 141  K 3.8 4.3 4.1  CL 103  --   --   CO2 23  --   --   GLUCOSE 197*  --   --   BUN 18 17 16   CREATININE 0.82 0.5 0.7  CALCIUM 9.4  --   --     Recent Labs  08/21/15  AST 15  ALT 19  ALKPHOS 53    Recent Labs  08/16/15 1128 08/21/15  WBC 6.8 6.4  NEUTROABS 4.1  --   HGB 14.2 13.8  HCT 43.4 41  MCV 98.9  --   PLT 173 172   Lab Results  Component Value Date   TSH 3.31 08/21/2015   Lab Results  Component Value Date   HGBA1C 6.7 01/08/2016   No results found for: CHOL, HDL, LDLCALC, LDLDIRECT, TRIG, CHOLHDL  Significant Diagnostic Results in last 30 days:  No results found.  Assessment/Plan Hypertension Controlled, continue Amlodipine 10mg , Losartan 100mg , Metoprolol 25mg  bid, 01/08/16 Na 141, K 4.1, Bun 16, creat 0.7     Constipation Stable, continue Senokot S II nightly.   GERD (gastroesophageal reflux disease) Stable, continue Famotidine 20mg  daily   Hypothyroidism Continue Levothyroxine 77mcg, TSH 3.31 08/21/15   Memory deficit Still functioning adequately in AL     Insomnia Stable, continue Trazodone 25mg  daily. Off prn Ativan 12/28/15     Lower back pain 07/19/14 X-ray lumbar spine and pelvis: T11 subacute process with more than 70% height loss 07/26/14 managing pain with Norco bid.  Pain is reasonably controlled. Continue prn Tramadol 50mg  q6h       Family/ staff Communication: continue AL for care assistance.   Labs/tests ordered:  none

## 2016-02-13 NOTE — Assessment & Plan Note (Signed)
07/19/14 X-ray lumbar spine and pelvis: T11 subacute process with more than 70% height loss 07/26/14 managing pain with Norco bid.  Pain is reasonably controlled. Continue prn Tramadol 50mg  q6h

## 2016-03-15 ENCOUNTER — Non-Acute Institutional Stay: Payer: Medicare Other | Admitting: Nurse Practitioner

## 2016-03-15 DIAGNOSIS — W19XXXA Unspecified fall, initial encounter: Secondary | ICD-10-CM

## 2016-03-15 DIAGNOSIS — I10 Essential (primary) hypertension: Secondary | ICD-10-CM | POA: Diagnosis not present

## 2016-03-15 DIAGNOSIS — R413 Other amnesia: Secondary | ICD-10-CM | POA: Diagnosis not present

## 2016-03-15 DIAGNOSIS — K5900 Constipation, unspecified: Secondary | ICD-10-CM | POA: Diagnosis not present

## 2016-03-15 DIAGNOSIS — E890 Postprocedural hypothyroidism: Secondary | ICD-10-CM

## 2016-03-15 DIAGNOSIS — K219 Gastro-esophageal reflux disease without esophagitis: Secondary | ICD-10-CM | POA: Diagnosis not present

## 2016-03-15 DIAGNOSIS — G4700 Insomnia, unspecified: Secondary | ICD-10-CM

## 2016-03-15 NOTE — Assessment & Plan Note (Signed)
Continue Levothyroxine 22mcg, TSH 3.31 08/21/15

## 2016-03-15 NOTE — Assessment & Plan Note (Signed)
Stable, continue Trazodone 25mg daily. Off prn Ativan 12/28/15 

## 2016-03-15 NOTE — Assessment & Plan Note (Signed)
07/19/14 X-ray lumbar spine and pelvis: T11 subacute process with more than 70% height loss 07/26/14 managing pain with Norco bid.  Pain is reasonably controlled. Continue prn Tylenol.

## 2016-03-15 NOTE — Progress Notes (Signed)
[facility]   Place of Service:   [place]  Provider:  Jakota Manthei, Manxie   NP  Jeanmarie Hubert, MD  Patient Care Team: Estill Dooms, MD as PCP - General (Internal Medicine) Fraya Ueda Otho Darner, NP as Nurse Practitioner (Nurse Practitioner) Charlotte Crumb, MD as Consulting Physician (Orthopedic Surgery) Wallene Huh, DPM as Consulting Physician (Podiatry) Gaynelle Arabian, MD as Consulting Physician (Orthopedic Surgery) Zebedee Iba, MD as Referring Physician (Ophthalmology) Newman Pies, MD as Consulting Physician (Neurosurgery) [facility] (Skilled Nursing and [place])  Extended Emergency Contact Information Primary Emergency Contact: [place] of [place] Phone: [phone] Mobile Phone: [phone] Relation: Daughter Secondary Emergency Contact: [place] of [place] Phone: [phone] Relation: Grandson  Code Status:  DNR Goals of care: Advanced Directive information Advanced Directives 02/13/2016  Does patient have an advance directive? Yes  Type of Paramedic of [place];Living will;Out of facility DNR (pink MOST or yellow form)  Does patient want to make changes to advanced directive? No - Patient declined  Copy of advanced directive(s) in chart? Yes  Pre-existing out of facility DNR order (yellow form or pink MOST form) -     Chief Complaint  Patient presents with  . Medical Management of Chronic Issues  . Acute Visit    Fall 03/13/16    HPI:  Pt is a 83 y.o. female seen today for medical management of chronic diseases.      Hx of GERD, stable while on Famotidine 20mg  daily, memory, resides in AL and functioning well, insomnia, sleeps with aid of Trazodone 25mg , back pain, better, ambulates with walker, prn Tramadol 50mg  q6h available to her, thyroid, taking Levothyroxine 55mcg, HTN, controlled on Amlodipine 10mg , Losartan 100mg , Metoprolol 25mg  bid. Not  constipation, taking Senna II qhs.  Past Medical History:  Diagnosis Date  . Arthritis   . Cataracts, bilateral   . Closed fracture of unspecified part of lower end of humerus   . Hypertension   . Insomnia, unspecified   . Macular degeneration (senile) of retina, unspecified   . Osteoarthritis   . Osteoarthrosis, unspecified whether generalized or localized, unspecified site   . Pancreatitis, acute 08/14/2013  . Retinal neovascularization NOS   . Senile osteoporosis   . Subarachnoid hemorrhage following injury ([place]) 03/31/2012  . Unspecified hearing loss   . Unspecified hypothyroidism   . Urinary tract infection 08/10/2013  . Vertebral fracture 08/09/2013   T7 10%, T12 70%    Past Surgical History:  Procedure Laterality Date  . ABDOMINAL HYSTERECTOMY  1942  . ANKLE FRACTURE SURGERY  1960  . BLADDER SUSPENSION    . CLOSED REDUCTION WITH HUMERAL PIN INSERTION  04/01/2012   Procedure: CLOSED REDUCTION WITH HUMERAL PIN INSERTION;  Surgeon: Schuyler Amor, MD;  Location: [facility];  Service: Orthopedics;  Laterality: Left;    No Known Allergies    Medication List       Accurate as of 03/15/16  3:15 PM. Always use your most recent med list.          acetaminophen 500 MG tablet Commonly known as:  TYLENOL Take 500 mg by mouth every 6 (six) hours as needed. Pain   amLODipine 10 MG tablet Commonly known as:  NORVASC Take 10 mg by mouth daily.   aspirin EC 81 MG tablet Take 81 mg by mouth daily.   CERTAVITE/ANTIOXIDANTS PO Take 1 tablet by mouth every morning.   PRESERVISION AREDS  2 PO Take 1 capsule by mouth 2 (two) times daily.   PRESERVISION AREDS Tabs Take 2 tablets by mouth 2 (two) times daily with a meal.   famotidine 20 MG tablet Commonly known as:  PEPCID Take 20 mg by mouth at bedtime.   levothyroxine 50 MCG tablet Commonly known as:  SYNTHROID, LEVOTHROID Take 50 mcg by mouth daily before breakfast.   losartan 100 MG tablet Commonly known as:   COZAAR Take 100 mg by mouth daily.   metoprolol 100 MG tablet Commonly known as:  LOPRESSOR Take 1 tablet twice daily to help control blood pressure   raloxifene 60 MG tablet Commonly known as:  EVISTA Take 60 mg by mouth daily.   senna-docusate 8.6-50 MG tablet Commonly known as:  Senokot-S Take 2 tablets by mouth at bedtime.   traZODone 50 MG tablet Commonly known as:  DESYREL Take 25 mg by mouth at bedtime.   Vitamin D-3 1000 units Caps Take 1,000 Units by mouth every morning.       Review of Systems  Constitutional: Negative for activity change, appetite change, chills, diaphoresis, fatigue, fever and unexpected weight change.  HENT: Positive for hearing loss. Negative for congestion, ear discharge, postnasal drip, rhinorrhea, trouble swallowing and voice change.   Eyes: Negative for pain, discharge, itching and visual disturbance.  Respiratory: Negative for cough, choking, chest tightness, shortness of breath and wheezing.   Cardiovascular: Negative for chest pain, palpitations and leg swelling.  Gastrointestinal: Negative for abdominal pain, constipation, diarrhea, nausea and vomiting.  Endocrine: Negative for cold intolerance, heat intolerance, polydipsia, polyphagia and polyuria.       Compensated hypothyroidismism  Genitourinary: Negative for dysuria, flank pain, frequency and urgency.  Musculoskeletal: Positive for arthralgias (left arm), back pain (improved) and gait problem. Negative for neck pain and neck stiffness.  Skin: Negative for pallor, rash and wound.  Allergic/Immunologic: Negative.   Neurological: Negative for tremors, syncope, speech difficulty, weakness, numbness and headaches.  Hematological: Negative.   Psychiatric/Behavioral: Positive for confusion (occasionally) and sleep disturbance. Negative for agitation, behavioral problems, dysphoric mood and hallucinations. The patient is not nervous/anxious.     Immunization History  Administered Date(s)  Administered  . Influenza-Unspecified 02/15/2014, 02/16/2015, 02/22/2016  . PPD Test 03/23/2012   Pertinent  Health Maintenance Due  Topic Date Due  . DEXA SCAN  12/29/1983  . PNA vac Low Risk Adult (1 of 2 - PCV13) 12/29/1983  . INFLUENZA VACCINE  Completed   Fall Risk  02/07/2015 02/22/2014 02/22/2014  Falls in the past year? No Yes No  Number falls in past yr: - 1 -  Risk for fall due to : - History of fall(s) -   Functional Status Survey:    There were no vitals filed for this visit. There is no height or weight on file to calculate BMI. Physical Exam  Constitutional: She appears well-developed and well-nourished.  HENT:  Head: Normocephalic and atraumatic.  Severe bilateral hearing loss  Eyes: Conjunctivae and EOM are normal. Pupils are equal, round, and reactive to light.  Neck: Normal range of motion. Neck supple.  Cardiovascular:  No murmur heard. Pulmonary/Chest: Effort normal and breath sounds normal. No respiratory distress. She has no wheezes. She has no rales. She exhibits no tenderness.  Abdominal: Soft. Bowel sounds are normal. She exhibits no distension and no mass. There is no tenderness. There is no rebound and no guarding.  Musculoskeletal: Normal range of motion. She exhibits tenderness. She exhibits no edema.  Unsteady gait. Lower  back across R+L SIJ-better managed with Norco bid  Neurological: She is alert. She displays normal reflexes. No cranial nerve deficit. She exhibits normal muscle tone. Coordination normal.  Forgetful. Short-term memory deficits.  Skin: Skin is warm and dry. No rash noted. No erythema.  Psychiatric: Her behavior is normal. Thought content normal. Her mood appears not anxious. Her affect is not angry, not labile and not inappropriate. Her speech is not rapid and/or pressured, not delayed and not slurred. She is not agitated, not aggressive, not slowed and not withdrawn. Thought content is not paranoid and not delusional. Cognition and  memory are impaired. She does not express impulsivity or inappropriate judgment. Depressed: flat affect. She exhibits abnormal recent memory.    Labs reviewed:  Recent Labs  08/16/15 1128 08/21/15 01/08/16  NA 138 140 141  K 3.8 4.3 4.1  CL 103  --   --   CO2 23  --   --   GLUCOSE 197*  --   --   BUN 18 17 16   CREATININE 0.82 0.5 0.7  CALCIUM 9.4  --   --     Recent Labs  08/21/15  AST 15  ALT 19  ALKPHOS 53    Recent Labs  08/16/15 1128 08/21/15  WBC 6.8 6.4  NEUTROABS 4.1  --   HGB 14.2 13.8  HCT 43.4 41  MCV 98.9  --   PLT 173 172   Lab Results  Component Value Date   TSH 3.31 08/21/2015   Lab Results  Component Value Date   HGBA1C 6.7 01/08/2016   No results found for: CHOL, HDL, LDLCALC, LDLDIRECT, TRIG, CHOLHDL  Significant Diagnostic Results in last 30 days:  No results found.  Assessment/Plan Fall 03/13/16 no apparent injury. Gait instability, lack of safety awareness related to her memory deficit contributory, close supervision needed for safety.   Hypertension Controlled, continue Amlodipine 10mg , Losartan 100mg , Metoprolol 25mg  bid, 01/08/16 Na 141, K 4.1, Bun 16, creat 0.7     Constipation Stable, continue Senokot S II nightly.    GERD (gastroesophageal reflux disease) Stable, continue Famotidine 20mg  daily  Hypothyroidism Continue Levothyroxine 36mcg, TSH 3.31 08/21/15  Osteoarthritis 07/19/14 X-ray lumbar spine and pelvis: T11 subacute process with more than 70% height loss 07/26/14 managing pain with Norco bid.  Pain is reasonably controlled. Continue prn Tylenol.    Memory deficit Still functioning adequately in AL   Insomnia Stable, continue Trazodone 25mg  daily. Off prn Ativan 12/28/15        Family/ staff Communication: continue AL for care assistance.   Labs/tests ordered:  none

## 2016-03-15 NOTE — Assessment & Plan Note (Signed)
Still functioning adequately in AL

## 2016-03-15 NOTE — Assessment & Plan Note (Signed)
03/13/16 no apparent injury. Gait instability, lack of safety awareness related to her memory deficit contributory, close supervision needed for safety.

## 2016-03-15 NOTE — Assessment & Plan Note (Signed)
Controlled, continue Amlodipine 10mg , Losartan 100mg , Metoprolol 25mg  bid, 01/08/16 Na 141, K 4.1, Bun 16, creat 0.7

## 2016-03-15 NOTE — Assessment & Plan Note (Signed)
Stable, continue Senokot S II nightly.  

## 2016-03-15 NOTE — Assessment & Plan Note (Signed)
Stable, continue Famotidine 20mg daily.   

## 2016-04-18 DIAGNOSIS — G4700 Insomnia, unspecified: Secondary | ICD-10-CM | POA: Diagnosis not present

## 2016-04-18 DIAGNOSIS — M159 Polyosteoarthritis, unspecified: Secondary | ICD-10-CM | POA: Diagnosis not present

## 2016-04-18 DIAGNOSIS — W19XXXA Unspecified fall, initial encounter: Secondary | ICD-10-CM | POA: Diagnosis not present

## 2016-04-18 DIAGNOSIS — R29898 Other symptoms and signs involving the musculoskeletal system: Secondary | ICD-10-CM | POA: Diagnosis not present

## 2016-04-18 DIAGNOSIS — M545 Low back pain: Secondary | ICD-10-CM | POA: Diagnosis not present

## 2016-04-18 DIAGNOSIS — R2681 Unsteadiness on feet: Secondary | ICD-10-CM | POA: Diagnosis not present

## 2016-04-18 DIAGNOSIS — F0280 Dementia in other diseases classified elsewhere without behavioral disturbance: Secondary | ICD-10-CM | POA: Diagnosis not present

## 2016-04-18 DIAGNOSIS — S066X0A Traumatic subarachnoid hemorrhage without loss of consciousness, initial encounter: Secondary | ICD-10-CM | POA: Diagnosis not present

## 2016-04-18 DIAGNOSIS — R296 Repeated falls: Secondary | ICD-10-CM | POA: Diagnosis not present

## 2016-04-18 DIAGNOSIS — H35059 Retinal neovascularization, unspecified, unspecified eye: Secondary | ICD-10-CM | POA: Diagnosis not present

## 2016-04-18 DIAGNOSIS — M25579 Pain in unspecified ankle and joints of unspecified foot: Secondary | ICD-10-CM | POA: Diagnosis not present

## 2016-04-18 DIAGNOSIS — R41841 Cognitive communication deficit: Secondary | ICD-10-CM | POA: Diagnosis not present

## 2016-04-18 DIAGNOSIS — H9190 Unspecified hearing loss, unspecified ear: Secondary | ICD-10-CM | POA: Diagnosis not present

## 2016-04-18 DIAGNOSIS — M6281 Muscle weakness (generalized): Secondary | ICD-10-CM | POA: Diagnosis not present

## 2016-04-18 DIAGNOSIS — I10 Essential (primary) hypertension: Secondary | ICD-10-CM | POA: Diagnosis not present

## 2016-04-18 DIAGNOSIS — S42409A Unspecified fracture of lower end of unspecified humerus, initial encounter for closed fracture: Secondary | ICD-10-CM | POA: Diagnosis not present

## 2016-04-18 DIAGNOSIS — H3530 Unspecified macular degeneration: Secondary | ICD-10-CM | POA: Diagnosis not present

## 2016-04-18 DIAGNOSIS — M810 Age-related osteoporosis without current pathological fracture: Secondary | ICD-10-CM | POA: Diagnosis not present

## 2016-04-19 ENCOUNTER — Non-Acute Institutional Stay: Payer: Medicare Other | Admitting: Nurse Practitioner

## 2016-04-19 ENCOUNTER — Encounter: Payer: Self-pay | Admitting: Nurse Practitioner

## 2016-04-19 DIAGNOSIS — I10 Essential (primary) hypertension: Secondary | ICD-10-CM | POA: Diagnosis not present

## 2016-04-19 DIAGNOSIS — R29898 Other symptoms and signs involving the musculoskeletal system: Secondary | ICD-10-CM | POA: Diagnosis not present

## 2016-04-19 DIAGNOSIS — K219 Gastro-esophageal reflux disease without esophagitis: Secondary | ICD-10-CM

## 2016-04-19 DIAGNOSIS — E039 Hypothyroidism, unspecified: Secondary | ICD-10-CM

## 2016-04-19 DIAGNOSIS — F5101 Primary insomnia: Secondary | ICD-10-CM | POA: Diagnosis not present

## 2016-04-19 DIAGNOSIS — F0280 Dementia in other diseases classified elsewhere without behavioral disturbance: Secondary | ICD-10-CM | POA: Diagnosis not present

## 2016-04-19 DIAGNOSIS — R41841 Cognitive communication deficit: Secondary | ICD-10-CM | POA: Diagnosis not present

## 2016-04-19 DIAGNOSIS — K5900 Constipation, unspecified: Secondary | ICD-10-CM | POA: Diagnosis not present

## 2016-04-19 DIAGNOSIS — M150 Primary generalized (osteo)arthritis: Secondary | ICD-10-CM

## 2016-04-19 DIAGNOSIS — M6281 Muscle weakness (generalized): Secondary | ICD-10-CM | POA: Diagnosis not present

## 2016-04-19 DIAGNOSIS — R296 Repeated falls: Secondary | ICD-10-CM | POA: Diagnosis not present

## 2016-04-19 DIAGNOSIS — M159 Polyosteoarthritis, unspecified: Secondary | ICD-10-CM

## 2016-04-19 DIAGNOSIS — R2681 Unsteadiness on feet: Secondary | ICD-10-CM | POA: Diagnosis not present

## 2016-04-19 NOTE — Assessment & Plan Note (Signed)
Continue Levothyroxine 35mcg, TSH 3.31 08/21/15

## 2016-04-19 NOTE — Assessment & Plan Note (Signed)
Controlled, continue Amlodipine 10mg , Losartan 100mg , Metoprolol 100mg  bid, 01/08/16 Na 141, K 4.1, Bun 16, creat 0.7

## 2016-04-19 NOTE — Assessment & Plan Note (Signed)
07/19/14 X-ray lumbar spine and pelvis: T11 subacute process with more than 70% height loss 07/26/14 managing pain with Norco bid.  Pain is reasonably controlled. Continue prn Tylenol and Tramadol

## 2016-04-19 NOTE — Assessment & Plan Note (Signed)
Stable, continue Senokot S II nightly.  

## 2016-04-19 NOTE — Assessment & Plan Note (Signed)
Stable, continue Trazodone 25mg daily. Off prn Ativan 12/28/15 

## 2016-04-19 NOTE — Assessment & Plan Note (Signed)
Stable, continue Famotidine 20mg daily.   

## 2016-04-19 NOTE — Progress Notes (Signed)
Location:  [place] Room Number: 26 Place of Service:  ALF [phone]) Provider:  Noelle Hoogland, Manxie  NP  Jeanmarie Hubert, MD  Patient Care Team: Estill Dooms, MD as PCP - General (Internal Medicine) Jaxiel Kines Otho Darner, NP as Nurse Practitioner (Nurse Practitioner) Charlotte Crumb, MD as Consulting Physician (Orthopedic Surgery) Wallene Huh, DPM as Consulting Physician (Podiatry) Gaynelle Arabian, MD as Consulting Physician (Orthopedic Surgery) Zebedee Iba, MD as Referring Physician (Ophthalmology) Newman Pies, MD as Consulting Physician (Neurosurgery) [facility] (Skilled Nursing and [place])  Extended Emergency Contact Information Primary Emergency Contact: [place] of [place] Phone: [phone] Mobile Phone: [phone] Relation: Daughter Secondary Emergency Contact: [place] of [place] Phone: [phone] Relation: Grandson  Code Status:  DNR Goals of care: Advanced Directive information Advanced Directives 04/19/2016  Does Patient Have a Medical Advance Directive? Yes  Type of Paramedic of [place];Living will;Out of facility DNR (pink MOST or yellow form)  Does patient want to make changes to medical advance directive? No - Patient declined  Copy of [place] in Chart? Yes  Pre-existing out of facility DNR order (yellow form or pink MOST form) -     Chief Complaint  Patient presents with  . Medical Management of Chronic Issues    HPI:  Melissa Carroll is a 82 y.o. female seen today for medical management of chronic diseases.     Hx of GERD, stable while on Famotidine 20mg  daily, memory, resides in AL and functioning well, insomnia, sleeps with aid of Trazodone 25mg , back pain, better, ambulates with walker, prn Tramadol 50mg  q6h available to her, thyroid, taking Levothyroxine 67mcg, last TSH 3.31 08/21/15, HTN, controlled on Amlodipine 10mg , Losartan  100mg , Metoprolol 100mg  bid. Not constipation, taking Senna II qhs.  Past Medical History:  Diagnosis Date  . Arthritis   . Cataracts, bilateral   . Closed fracture of unspecified part of lower end of humerus   . Hypertension   . Insomnia, unspecified   . Macular degeneration (senile) of retina, unspecified   . Osteoarthritis   . Osteoarthrosis, unspecified whether generalized or localized, unspecified site   . Pancreatitis, acute 08/14/2013  . Retinal neovascularization NOS   . Senile osteoporosis   . Subarachnoid hemorrhage following injury ([place]) 03/31/2012  . Unspecified hearing loss   . Unspecified hypothyroidism   . Urinary tract infection 08/10/2013  . Vertebral fracture 08/09/2013   T7 10%, T12 70%    Past Surgical History:  Procedure Laterality Date  . ABDOMINAL HYSTERECTOMY  1942  . ANKLE FRACTURE SURGERY  1960  . BLADDER SUSPENSION    . CLOSED REDUCTION WITH HUMERAL PIN INSERTION  04/01/2012   Procedure: CLOSED REDUCTION WITH HUMERAL PIN INSERTION;  Surgeon: Schuyler Amor, MD;  Location: [facility];  Service: Orthopedics;  Laterality: Left;    No Known Allergies    Medication List       Accurate as of 04/19/16 12:13 PM. Always use your most recent med list.          acetaminophen 500 MG tablet Commonly known as:  TYLENOL Take 500 mg by mouth every 6 (six) hours as needed. Pain   amLODipine 10 MG tablet Commonly known as:  NORVASC Take 10 mg by mouth daily.   aspirin EC 81 MG tablet Take 81 mg by mouth daily.   CERTAVITE/ANTIOXIDANTS PO Take 1 tablet by mouth every morning.   PRESERVISION AREDS 2 PO Take  1 capsule by mouth 2 (two) times daily.   PRESERVISION AREDS Tabs Take 2 tablets by mouth 2 (two) times daily with a meal.   famotidine 20 MG tablet Commonly known as:  PEPCID Take 20 mg by mouth at bedtime.   levothyroxine 50 MCG tablet Commonly known as:  SYNTHROID, LEVOTHROID Take 50 mcg by mouth daily before breakfast.   losartan 100 MG  tablet Commonly known as:  COZAAR Take 100 mg by mouth daily.   metoprolol 100 MG tablet Commonly known as:  LOPRESSOR Take 1 tablet twice daily to help control blood pressure   raloxifene 60 MG tablet Commonly known as:  EVISTA Take 60 mg by mouth daily.   senna-docusate 8.6-50 MG tablet Commonly known as:  Senokot-S Take 2 tablets by mouth at bedtime.   traZODone 50 MG tablet Commonly known as:  DESYREL Take 25 mg by mouth at bedtime.   Vitamin D-3 1000 units Caps Take 1,000 Units by mouth every morning.       Review of Systems  Constitutional: Negative for activity change, appetite change, chills, diaphoresis, fatigue, fever and unexpected weight change.  HENT: Positive for hearing loss. Negative for congestion, ear discharge, postnasal drip, rhinorrhea, trouble swallowing and voice change.   Eyes: Negative for pain, discharge, itching and visual disturbance.  Respiratory: Negative for cough, choking, chest tightness, shortness of breath and wheezing.   Cardiovascular: Negative for chest pain, palpitations and leg swelling.  Gastrointestinal: Negative for abdominal pain, constipation, diarrhea, nausea and vomiting.  Endocrine: Negative for cold intolerance, heat intolerance, polydipsia, polyphagia and polyuria.       Compensated hypothyroidismism  Genitourinary: Negative for dysuria, flank pain, frequency and urgency.  Musculoskeletal: Positive for arthralgias (left arm), back pain (improved) and gait problem. Negative for neck pain and neck stiffness.  Skin: Negative for pallor, rash and wound.  Allergic/Immunologic: Negative.   Neurological: Negative for tremors, syncope, speech difficulty, weakness, numbness and headaches.  Hematological: Negative.   Psychiatric/Behavioral: Positive for confusion (occasionally) and sleep disturbance. Negative for agitation, behavioral problems, dysphoric mood and hallucinations. The patient is not nervous/anxious.     Immunization  History  Administered Date(s) Administered  . Influenza-Unspecified 02/15/2014, 02/16/2015, 02/22/2016  . PPD Test 03/23/2012   Pertinent  Health Maintenance Due  Topic Date Due  . DEXA SCAN  12/29/1983  . PNA vac Low Risk Adult (1 of 2 - PCV13) 12/29/1983  . INFLUENZA VACCINE  Completed   Fall Risk  02/07/2015 02/22/2014 02/22/2014  Falls in the past year? No Yes No  Number falls in past yr: - 1 -  Risk for fall due to : - History of fall(s) -   Functional Status Survey:    Vitals:   04/19/16 1125  BP: (!) 158/80  Pulse: 76  Resp: (!) 21  Temp: 97.6 F (36.4 C)  SpO2: 96%  Weight: 155 lb 9.6 oz (70.6 kg)  Height: 5\' 1"  (1.549 m)   Body mass index is 29.4 kg/m. Physical Exam  Constitutional: She appears well-developed and well-nourished.  HENT:  Head: Normocephalic and atraumatic.  Severe bilateral hearing loss  Eyes: Conjunctivae and EOM are normal. Pupils are equal, round, and reactive to light.  Neck: Normal range of motion. Neck supple.  Cardiovascular:  No murmur heard. Pulmonary/Chest: Effort normal and breath sounds normal. No respiratory distress. She has no wheezes. She has no rales. She exhibits no tenderness.  Abdominal: Soft. Bowel sounds are normal. She exhibits no distension and no mass. There is no  tenderness. There is no rebound and no guarding.  Musculoskeletal: Normal range of motion. She exhibits tenderness. She exhibits no edema.  Unsteady gait. Lower back across R+L SIJ-better managed with Norco bid  Neurological: She is alert. She displays normal reflexes. No cranial nerve deficit. She exhibits normal muscle tone. Coordination normal.  Forgetful. Short-term memory deficits.  Skin: Skin is warm and dry. No rash noted. No erythema.  Psychiatric: Her behavior is normal. Thought content normal. Her mood appears not anxious. Her affect is not angry, not labile and not inappropriate. Her speech is not rapid and/or pressured, not delayed and not slurred.  She is not agitated, not aggressive, not slowed and not withdrawn. Thought content is not paranoid and not delusional. Cognition and memory are impaired. She does not express impulsivity or inappropriate judgment. Depressed: flat affect. She exhibits abnormal recent memory.    Labs reviewed:  Recent Labs  08/16/15 1128 08/21/15 01/08/16  NA 138 140 141  K 3.8 4.3 4.1  CL 103  --   --   CO2 23  --   --   GLUCOSE 197*  --   --   BUN 18 17 16   CREATININE 0.82 0.5 0.7  CALCIUM 9.4  --   --     Recent Labs  08/21/15  AST 15  ALT 19  ALKPHOS 53    Recent Labs  08/16/15 1128 08/21/15  WBC 6.8 6.4  NEUTROABS 4.1  --   HGB 14.2 13.8  HCT 43.4 41  MCV 98.9  --   PLT 173 172   Lab Results  Component Value Date   TSH 3.31 08/21/2015   Lab Results  Component Value Date   HGBA1C 6.7 01/08/2016   No results found for: CHOL, HDL, LDLCALC, LDLDIRECT, TRIG, CHOLHDL  Significant Diagnostic Results in last 30 days:  No results found.  Assessment/Plan Hypertension Controlled, continue Amlodipine 10mg , Losartan 100mg , Metoprolol 100mg  bid, 01/08/16 Na 141, K 4.1, Bun 16, creat 0.7    Constipation Stable, continue Senokot S II nightly.    GERD (gastroesophageal reflux disease) Stable, continue Famotidine 20mg  daily   Hypothyroidism Continue Levothyroxine 24mcg, TSH 3.31 08/21/15  Osteoarthritis 07/19/14 X-ray lumbar spine and pelvis: T11 subacute process with more than 70% height loss 07/26/14 managing pain with Norco bid.  Pain is reasonably controlled. Continue prn Tylenol and Tramadol    Insomnia Stable, continue Trazodone 25mg  daily. Off prn Ativan 12/28/15     Family/ staff Communication: AL  Labs/tests ordered:  none

## 2016-04-22 DIAGNOSIS — R41841 Cognitive communication deficit: Secondary | ICD-10-CM | POA: Diagnosis not present

## 2016-04-22 DIAGNOSIS — R296 Repeated falls: Secondary | ICD-10-CM | POA: Diagnosis not present

## 2016-04-22 DIAGNOSIS — M6281 Muscle weakness (generalized): Secondary | ICD-10-CM | POA: Diagnosis not present

## 2016-04-22 DIAGNOSIS — R29898 Other symptoms and signs involving the musculoskeletal system: Secondary | ICD-10-CM | POA: Diagnosis not present

## 2016-04-22 DIAGNOSIS — F0280 Dementia in other diseases classified elsewhere without behavioral disturbance: Secondary | ICD-10-CM | POA: Diagnosis not present

## 2016-04-22 DIAGNOSIS — R2681 Unsteadiness on feet: Secondary | ICD-10-CM | POA: Diagnosis not present

## 2016-04-23 DIAGNOSIS — R2681 Unsteadiness on feet: Secondary | ICD-10-CM | POA: Diagnosis not present

## 2016-04-23 DIAGNOSIS — R41841 Cognitive communication deficit: Secondary | ICD-10-CM | POA: Diagnosis not present

## 2016-04-23 DIAGNOSIS — F0280 Dementia in other diseases classified elsewhere without behavioral disturbance: Secondary | ICD-10-CM | POA: Diagnosis not present

## 2016-04-23 DIAGNOSIS — M6281 Muscle weakness (generalized): Secondary | ICD-10-CM | POA: Diagnosis not present

## 2016-04-23 DIAGNOSIS — R29898 Other symptoms and signs involving the musculoskeletal system: Secondary | ICD-10-CM | POA: Diagnosis not present

## 2016-04-23 DIAGNOSIS — R296 Repeated falls: Secondary | ICD-10-CM | POA: Diagnosis not present

## 2016-04-24 DIAGNOSIS — R41841 Cognitive communication deficit: Secondary | ICD-10-CM | POA: Diagnosis not present

## 2016-04-24 DIAGNOSIS — F0280 Dementia in other diseases classified elsewhere without behavioral disturbance: Secondary | ICD-10-CM | POA: Diagnosis not present

## 2016-04-24 DIAGNOSIS — M6281 Muscle weakness (generalized): Secondary | ICD-10-CM | POA: Diagnosis not present

## 2016-04-24 DIAGNOSIS — R296 Repeated falls: Secondary | ICD-10-CM | POA: Diagnosis not present

## 2016-04-24 DIAGNOSIS — R29898 Other symptoms and signs involving the musculoskeletal system: Secondary | ICD-10-CM | POA: Diagnosis not present

## 2016-04-24 DIAGNOSIS — R2681 Unsteadiness on feet: Secondary | ICD-10-CM | POA: Diagnosis not present

## 2016-04-26 DIAGNOSIS — F0280 Dementia in other diseases classified elsewhere without behavioral disturbance: Secondary | ICD-10-CM | POA: Diagnosis not present

## 2016-04-26 DIAGNOSIS — R296 Repeated falls: Secondary | ICD-10-CM | POA: Diagnosis not present

## 2016-04-26 DIAGNOSIS — M6281 Muscle weakness (generalized): Secondary | ICD-10-CM | POA: Diagnosis not present

## 2016-04-26 DIAGNOSIS — R29898 Other symptoms and signs involving the musculoskeletal system: Secondary | ICD-10-CM | POA: Diagnosis not present

## 2016-04-26 DIAGNOSIS — R2681 Unsteadiness on feet: Secondary | ICD-10-CM | POA: Diagnosis not present

## 2016-04-26 DIAGNOSIS — R41841 Cognitive communication deficit: Secondary | ICD-10-CM | POA: Diagnosis not present

## 2016-04-29 DIAGNOSIS — R296 Repeated falls: Secondary | ICD-10-CM | POA: Diagnosis not present

## 2016-04-29 DIAGNOSIS — F0280 Dementia in other diseases classified elsewhere without behavioral disturbance: Secondary | ICD-10-CM | POA: Diagnosis not present

## 2016-04-29 DIAGNOSIS — M6281 Muscle weakness (generalized): Secondary | ICD-10-CM | POA: Diagnosis not present

## 2016-04-29 DIAGNOSIS — R2681 Unsteadiness on feet: Secondary | ICD-10-CM | POA: Diagnosis not present

## 2016-04-29 DIAGNOSIS — R29898 Other symptoms and signs involving the musculoskeletal system: Secondary | ICD-10-CM | POA: Diagnosis not present

## 2016-04-29 DIAGNOSIS — R41841 Cognitive communication deficit: Secondary | ICD-10-CM | POA: Diagnosis not present

## 2016-04-30 DIAGNOSIS — R41841 Cognitive communication deficit: Secondary | ICD-10-CM | POA: Diagnosis not present

## 2016-04-30 DIAGNOSIS — R296 Repeated falls: Secondary | ICD-10-CM | POA: Diagnosis not present

## 2016-04-30 DIAGNOSIS — M6281 Muscle weakness (generalized): Secondary | ICD-10-CM | POA: Diagnosis not present

## 2016-04-30 DIAGNOSIS — R29898 Other symptoms and signs involving the musculoskeletal system: Secondary | ICD-10-CM | POA: Diagnosis not present

## 2016-04-30 DIAGNOSIS — R2681 Unsteadiness on feet: Secondary | ICD-10-CM | POA: Diagnosis not present

## 2016-04-30 DIAGNOSIS — F0280 Dementia in other diseases classified elsewhere without behavioral disturbance: Secondary | ICD-10-CM | POA: Diagnosis not present

## 2016-05-01 DIAGNOSIS — R41841 Cognitive communication deficit: Secondary | ICD-10-CM | POA: Diagnosis not present

## 2016-05-01 DIAGNOSIS — M6281 Muscle weakness (generalized): Secondary | ICD-10-CM | POA: Diagnosis not present

## 2016-05-01 DIAGNOSIS — F0280 Dementia in other diseases classified elsewhere without behavioral disturbance: Secondary | ICD-10-CM | POA: Diagnosis not present

## 2016-05-01 DIAGNOSIS — R296 Repeated falls: Secondary | ICD-10-CM | POA: Diagnosis not present

## 2016-05-01 DIAGNOSIS — R29898 Other symptoms and signs involving the musculoskeletal system: Secondary | ICD-10-CM | POA: Diagnosis not present

## 2016-05-01 DIAGNOSIS — R2681 Unsteadiness on feet: Secondary | ICD-10-CM | POA: Diagnosis not present

## 2016-05-02 DIAGNOSIS — M6281 Muscle weakness (generalized): Secondary | ICD-10-CM | POA: Diagnosis not present

## 2016-05-02 DIAGNOSIS — R2681 Unsteadiness on feet: Secondary | ICD-10-CM | POA: Diagnosis not present

## 2016-05-02 DIAGNOSIS — F0280 Dementia in other diseases classified elsewhere without behavioral disturbance: Secondary | ICD-10-CM | POA: Diagnosis not present

## 2016-05-02 DIAGNOSIS — R41841 Cognitive communication deficit: Secondary | ICD-10-CM | POA: Diagnosis not present

## 2016-05-02 DIAGNOSIS — R29898 Other symptoms and signs involving the musculoskeletal system: Secondary | ICD-10-CM | POA: Diagnosis not present

## 2016-05-02 DIAGNOSIS — R296 Repeated falls: Secondary | ICD-10-CM | POA: Diagnosis not present

## 2016-05-04 DIAGNOSIS — R29898 Other symptoms and signs involving the musculoskeletal system: Secondary | ICD-10-CM | POA: Diagnosis not present

## 2016-05-04 DIAGNOSIS — M6281 Muscle weakness (generalized): Secondary | ICD-10-CM | POA: Diagnosis not present

## 2016-05-04 DIAGNOSIS — R2681 Unsteadiness on feet: Secondary | ICD-10-CM | POA: Diagnosis not present

## 2016-05-04 DIAGNOSIS — R41841 Cognitive communication deficit: Secondary | ICD-10-CM | POA: Diagnosis not present

## 2016-05-04 DIAGNOSIS — R296 Repeated falls: Secondary | ICD-10-CM | POA: Diagnosis not present

## 2016-05-04 DIAGNOSIS — F0280 Dementia in other diseases classified elsewhere without behavioral disturbance: Secondary | ICD-10-CM | POA: Diagnosis not present

## 2016-05-07 DIAGNOSIS — F0280 Dementia in other diseases classified elsewhere without behavioral disturbance: Secondary | ICD-10-CM | POA: Diagnosis not present

## 2016-05-07 DIAGNOSIS — R2681 Unsteadiness on feet: Secondary | ICD-10-CM | POA: Diagnosis not present

## 2016-05-07 DIAGNOSIS — R29898 Other symptoms and signs involving the musculoskeletal system: Secondary | ICD-10-CM | POA: Diagnosis not present

## 2016-05-07 DIAGNOSIS — R296 Repeated falls: Secondary | ICD-10-CM | POA: Diagnosis not present

## 2016-05-07 DIAGNOSIS — M6281 Muscle weakness (generalized): Secondary | ICD-10-CM | POA: Diagnosis not present

## 2016-05-07 DIAGNOSIS — R41841 Cognitive communication deficit: Secondary | ICD-10-CM | POA: Diagnosis not present

## 2016-05-08 DIAGNOSIS — R29898 Other symptoms and signs involving the musculoskeletal system: Secondary | ICD-10-CM | POA: Diagnosis not present

## 2016-05-08 DIAGNOSIS — R2681 Unsteadiness on feet: Secondary | ICD-10-CM | POA: Diagnosis not present

## 2016-05-08 DIAGNOSIS — R41841 Cognitive communication deficit: Secondary | ICD-10-CM | POA: Diagnosis not present

## 2016-05-08 DIAGNOSIS — M6281 Muscle weakness (generalized): Secondary | ICD-10-CM | POA: Diagnosis not present

## 2016-05-08 DIAGNOSIS — F0280 Dementia in other diseases classified elsewhere without behavioral disturbance: Secondary | ICD-10-CM | POA: Diagnosis not present

## 2016-05-08 DIAGNOSIS — R296 Repeated falls: Secondary | ICD-10-CM | POA: Diagnosis not present

## 2016-05-09 DIAGNOSIS — R41841 Cognitive communication deficit: Secondary | ICD-10-CM | POA: Diagnosis not present

## 2016-05-09 DIAGNOSIS — R296 Repeated falls: Secondary | ICD-10-CM | POA: Diagnosis not present

## 2016-05-09 DIAGNOSIS — R2681 Unsteadiness on feet: Secondary | ICD-10-CM | POA: Diagnosis not present

## 2016-05-09 DIAGNOSIS — R29898 Other symptoms and signs involving the musculoskeletal system: Secondary | ICD-10-CM | POA: Diagnosis not present

## 2016-05-09 DIAGNOSIS — F0280 Dementia in other diseases classified elsewhere without behavioral disturbance: Secondary | ICD-10-CM | POA: Diagnosis not present

## 2016-05-09 DIAGNOSIS — M6281 Muscle weakness (generalized): Secondary | ICD-10-CM | POA: Diagnosis not present

## 2016-05-10 DIAGNOSIS — F0280 Dementia in other diseases classified elsewhere without behavioral disturbance: Secondary | ICD-10-CM | POA: Diagnosis not present

## 2016-05-10 DIAGNOSIS — R2681 Unsteadiness on feet: Secondary | ICD-10-CM | POA: Diagnosis not present

## 2016-05-10 DIAGNOSIS — R296 Repeated falls: Secondary | ICD-10-CM | POA: Diagnosis not present

## 2016-05-10 DIAGNOSIS — R41841 Cognitive communication deficit: Secondary | ICD-10-CM | POA: Diagnosis not present

## 2016-05-10 DIAGNOSIS — M6281 Muscle weakness (generalized): Secondary | ICD-10-CM | POA: Diagnosis not present

## 2016-05-10 DIAGNOSIS — R29898 Other symptoms and signs involving the musculoskeletal system: Secondary | ICD-10-CM | POA: Diagnosis not present

## 2016-05-14 DIAGNOSIS — I10 Essential (primary) hypertension: Secondary | ICD-10-CM | POA: Diagnosis not present

## 2016-05-14 DIAGNOSIS — H9190 Unspecified hearing loss, unspecified ear: Secondary | ICD-10-CM | POA: Diagnosis not present

## 2016-05-14 DIAGNOSIS — M6281 Muscle weakness (generalized): Secondary | ICD-10-CM | POA: Diagnosis not present

## 2016-05-14 DIAGNOSIS — S42409A Unspecified fracture of lower end of unspecified humerus, initial encounter for closed fracture: Secondary | ICD-10-CM | POA: Diagnosis not present

## 2016-05-14 DIAGNOSIS — R2681 Unsteadiness on feet: Secondary | ICD-10-CM | POA: Diagnosis not present

## 2016-05-14 DIAGNOSIS — H35059 Retinal neovascularization, unspecified, unspecified eye: Secondary | ICD-10-CM | POA: Diagnosis not present

## 2016-05-14 DIAGNOSIS — F0280 Dementia in other diseases classified elsewhere without behavioral disturbance: Secondary | ICD-10-CM | POA: Diagnosis not present

## 2016-05-14 DIAGNOSIS — M545 Low back pain: Secondary | ICD-10-CM | POA: Diagnosis not present

## 2016-05-14 DIAGNOSIS — H3530 Unspecified macular degeneration: Secondary | ICD-10-CM | POA: Diagnosis not present

## 2016-05-14 DIAGNOSIS — R29898 Other symptoms and signs involving the musculoskeletal system: Secondary | ICD-10-CM | POA: Diagnosis not present

## 2016-05-14 DIAGNOSIS — G4700 Insomnia, unspecified: Secondary | ICD-10-CM | POA: Diagnosis not present

## 2016-05-14 DIAGNOSIS — R296 Repeated falls: Secondary | ICD-10-CM | POA: Diagnosis not present

## 2016-05-14 DIAGNOSIS — M25579 Pain in unspecified ankle and joints of unspecified foot: Secondary | ICD-10-CM | POA: Diagnosis not present

## 2016-05-14 DIAGNOSIS — M810 Age-related osteoporosis without current pathological fracture: Secondary | ICD-10-CM | POA: Diagnosis not present

## 2016-05-14 DIAGNOSIS — R41841 Cognitive communication deficit: Secondary | ICD-10-CM | POA: Diagnosis not present

## 2016-05-14 DIAGNOSIS — M159 Polyosteoarthritis, unspecified: Secondary | ICD-10-CM | POA: Diagnosis not present

## 2016-05-14 DIAGNOSIS — S066X0A Traumatic subarachnoid hemorrhage without loss of consciousness, initial encounter: Secondary | ICD-10-CM | POA: Diagnosis not present

## 2016-05-14 DIAGNOSIS — W19XXXA Unspecified fall, initial encounter: Secondary | ICD-10-CM | POA: Diagnosis not present

## 2016-05-15 DIAGNOSIS — R296 Repeated falls: Secondary | ICD-10-CM | POA: Diagnosis not present

## 2016-05-15 DIAGNOSIS — R29898 Other symptoms and signs involving the musculoskeletal system: Secondary | ICD-10-CM | POA: Diagnosis not present

## 2016-05-15 DIAGNOSIS — F0280 Dementia in other diseases classified elsewhere without behavioral disturbance: Secondary | ICD-10-CM | POA: Diagnosis not present

## 2016-05-15 DIAGNOSIS — M6281 Muscle weakness (generalized): Secondary | ICD-10-CM | POA: Diagnosis not present

## 2016-05-15 DIAGNOSIS — R41841 Cognitive communication deficit: Secondary | ICD-10-CM | POA: Diagnosis not present

## 2016-05-15 DIAGNOSIS — R2681 Unsteadiness on feet: Secondary | ICD-10-CM | POA: Diagnosis not present

## 2016-05-16 DIAGNOSIS — F0280 Dementia in other diseases classified elsewhere without behavioral disturbance: Secondary | ICD-10-CM | POA: Diagnosis not present

## 2016-05-16 DIAGNOSIS — R296 Repeated falls: Secondary | ICD-10-CM | POA: Diagnosis not present

## 2016-05-16 DIAGNOSIS — R2681 Unsteadiness on feet: Secondary | ICD-10-CM | POA: Diagnosis not present

## 2016-05-16 DIAGNOSIS — M6281 Muscle weakness (generalized): Secondary | ICD-10-CM | POA: Diagnosis not present

## 2016-05-16 DIAGNOSIS — R29898 Other symptoms and signs involving the musculoskeletal system: Secondary | ICD-10-CM | POA: Diagnosis not present

## 2016-05-16 DIAGNOSIS — R41841 Cognitive communication deficit: Secondary | ICD-10-CM | POA: Diagnosis not present

## 2016-05-17 DIAGNOSIS — R41841 Cognitive communication deficit: Secondary | ICD-10-CM | POA: Diagnosis not present

## 2016-05-17 DIAGNOSIS — M6281 Muscle weakness (generalized): Secondary | ICD-10-CM | POA: Diagnosis not present

## 2016-05-17 DIAGNOSIS — R2681 Unsteadiness on feet: Secondary | ICD-10-CM | POA: Diagnosis not present

## 2016-05-17 DIAGNOSIS — R29898 Other symptoms and signs involving the musculoskeletal system: Secondary | ICD-10-CM | POA: Diagnosis not present

## 2016-05-17 DIAGNOSIS — R296 Repeated falls: Secondary | ICD-10-CM | POA: Diagnosis not present

## 2016-05-17 DIAGNOSIS — F0280 Dementia in other diseases classified elsewhere without behavioral disturbance: Secondary | ICD-10-CM | POA: Diagnosis not present

## 2016-05-21 DIAGNOSIS — R29898 Other symptoms and signs involving the musculoskeletal system: Secondary | ICD-10-CM | POA: Diagnosis not present

## 2016-05-21 DIAGNOSIS — R2681 Unsteadiness on feet: Secondary | ICD-10-CM | POA: Diagnosis not present

## 2016-05-21 DIAGNOSIS — R296 Repeated falls: Secondary | ICD-10-CM | POA: Diagnosis not present

## 2016-05-21 DIAGNOSIS — R41841 Cognitive communication deficit: Secondary | ICD-10-CM | POA: Diagnosis not present

## 2016-05-21 DIAGNOSIS — F0280 Dementia in other diseases classified elsewhere without behavioral disturbance: Secondary | ICD-10-CM | POA: Diagnosis not present

## 2016-05-21 DIAGNOSIS — M6281 Muscle weakness (generalized): Secondary | ICD-10-CM | POA: Diagnosis not present

## 2016-05-22 DIAGNOSIS — R2681 Unsteadiness on feet: Secondary | ICD-10-CM | POA: Diagnosis not present

## 2016-05-22 DIAGNOSIS — R41841 Cognitive communication deficit: Secondary | ICD-10-CM | POA: Diagnosis not present

## 2016-05-22 DIAGNOSIS — F0280 Dementia in other diseases classified elsewhere without behavioral disturbance: Secondary | ICD-10-CM | POA: Diagnosis not present

## 2016-05-22 DIAGNOSIS — R296 Repeated falls: Secondary | ICD-10-CM | POA: Diagnosis not present

## 2016-05-22 DIAGNOSIS — M6281 Muscle weakness (generalized): Secondary | ICD-10-CM | POA: Diagnosis not present

## 2016-05-22 DIAGNOSIS — R29898 Other symptoms and signs involving the musculoskeletal system: Secondary | ICD-10-CM | POA: Diagnosis not present

## 2016-05-23 DIAGNOSIS — R29898 Other symptoms and signs involving the musculoskeletal system: Secondary | ICD-10-CM | POA: Diagnosis not present

## 2016-05-23 DIAGNOSIS — N390 Urinary tract infection, site not specified: Secondary | ICD-10-CM | POA: Diagnosis not present

## 2016-05-23 DIAGNOSIS — M6281 Muscle weakness (generalized): Secondary | ICD-10-CM | POA: Diagnosis not present

## 2016-05-23 DIAGNOSIS — R2681 Unsteadiness on feet: Secondary | ICD-10-CM | POA: Diagnosis not present

## 2016-05-23 DIAGNOSIS — F0280 Dementia in other diseases classified elsewhere without behavioral disturbance: Secondary | ICD-10-CM | POA: Diagnosis not present

## 2016-05-23 DIAGNOSIS — R296 Repeated falls: Secondary | ICD-10-CM | POA: Diagnosis not present

## 2016-05-23 DIAGNOSIS — R41841 Cognitive communication deficit: Secondary | ICD-10-CM | POA: Diagnosis not present

## 2016-05-27 DIAGNOSIS — R296 Repeated falls: Secondary | ICD-10-CM | POA: Diagnosis not present

## 2016-05-27 DIAGNOSIS — M6281 Muscle weakness (generalized): Secondary | ICD-10-CM | POA: Diagnosis not present

## 2016-05-27 DIAGNOSIS — R2681 Unsteadiness on feet: Secondary | ICD-10-CM | POA: Diagnosis not present

## 2016-05-27 DIAGNOSIS — R41841 Cognitive communication deficit: Secondary | ICD-10-CM | POA: Diagnosis not present

## 2016-05-27 DIAGNOSIS — F0280 Dementia in other diseases classified elsewhere without behavioral disturbance: Secondary | ICD-10-CM | POA: Diagnosis not present

## 2016-05-27 DIAGNOSIS — R29898 Other symptoms and signs involving the musculoskeletal system: Secondary | ICD-10-CM | POA: Diagnosis not present

## 2016-05-28 DIAGNOSIS — R2681 Unsteadiness on feet: Secondary | ICD-10-CM | POA: Diagnosis not present

## 2016-05-28 DIAGNOSIS — F0280 Dementia in other diseases classified elsewhere without behavioral disturbance: Secondary | ICD-10-CM | POA: Diagnosis not present

## 2016-05-28 DIAGNOSIS — R29898 Other symptoms and signs involving the musculoskeletal system: Secondary | ICD-10-CM | POA: Diagnosis not present

## 2016-05-28 DIAGNOSIS — M6281 Muscle weakness (generalized): Secondary | ICD-10-CM | POA: Diagnosis not present

## 2016-05-28 DIAGNOSIS — R296 Repeated falls: Secondary | ICD-10-CM | POA: Diagnosis not present

## 2016-05-28 DIAGNOSIS — R41841 Cognitive communication deficit: Secondary | ICD-10-CM | POA: Diagnosis not present

## 2016-05-31 ENCOUNTER — Encounter: Payer: Self-pay | Admitting: Internal Medicine

## 2016-05-31 ENCOUNTER — Non-Acute Institutional Stay: Payer: Medicare Other | Admitting: Internal Medicine

## 2016-05-31 DIAGNOSIS — R413 Other amnesia: Secondary | ICD-10-CM | POA: Diagnosis not present

## 2016-05-31 DIAGNOSIS — E039 Hypothyroidism, unspecified: Secondary | ICD-10-CM | POA: Diagnosis not present

## 2016-05-31 DIAGNOSIS — I10 Essential (primary) hypertension: Secondary | ICD-10-CM | POA: Diagnosis not present

## 2016-05-31 NOTE — Progress Notes (Signed)
Progress Note    Location:  [facility] Room Number: AL 26 Place of Service:  ALF [phone]) Provider:   Jeanmarie Hubert, MD  Patient Care Team: Estill Dooms, MD as PCP - General (Internal Medicine) Man Otho Darner, NP as Nurse Practitioner (Nurse Practitioner) Charlotte Crumb, MD as Consulting Physician (Orthopedic Surgery) Wallene Huh, DPM as Consulting Physician (Podiatry) Gaynelle Arabian, MD as Consulting Physician (Orthopedic Surgery) Zebedee Iba, MD as Referring Physician (Ophthalmology) Newman Pies, MD as Consulting Physician (Neurosurgery) [facility] (Skilled Nursing and [place])  Extended Emergency Contact Information Primary Emergency Contact: [place] Phone: [phone] Mobile Phone: [phone] Relation: Daughter Secondary Emergency Contact: [place] Phone: [phone] Relation: Grandson  Code Status:  DNR Goals of care: Advanced Directive information Advanced Directives 05/31/2016  Does Patient Have a Medical Advance Directive? Yes  Type of Paramedic of [place];Living will;Out of facility DNR (pink MOST or yellow form)  Does patient want to make changes to medical advance directive? -  Copy of [place] in Chart? Yes  Pre-existing out of facility DNR order (yellow form or pink MOST form) Yellow form placed in chart (order not valid for inpatient use)     Chief Complaint  Patient presents with  . Medical Management of Chronic Issues    routine visit    HPI:  Pt is a 83 y.o. female seen today for medical management of chronic diseases.    Essential hypertension - controlled  Hypothyroidism, unspecified type - compensated  Memory deficit - unchanged     Past Medical History:  Diagnosis Date  . Arthritis   . Cataracts, bilateral   . Closed fracture of unspecified part of lower end of  humerus   . Hypertension   . Insomnia, unspecified   . Macular degeneration (senile) of retina, unspecified   . Osteoarthritis   . Osteoarthrosis, unspecified whether generalized or localized, unspecified site   . Pancreatitis, acute 08/14/2013  . Retinal neovascularization NOS   . Senile osteoporosis   . Subarachnoid hemorrhage following injury ([place]) 03/31/2012  . Unspecified hearing loss   . Unspecified hypothyroidism   . Urinary tract infection 08/10/2013  . Vertebral fracture 08/09/2013   T7 10%, T12 70%    Past Surgical History:  Procedure Laterality Date  . ABDOMINAL HYSTERECTOMY  1942  . ANKLE FRACTURE SURGERY  1960  . BLADDER SUSPENSION    . CLOSED REDUCTION WITH HUMERAL PIN INSERTION  04/01/2012   Procedure: CLOSED REDUCTION WITH HUMERAL PIN INSERTION;  Surgeon: Schuyler Amor, MD;  Location: [facility];  Service: Orthopedics;  Laterality: Left;    No Known Allergies  Allergies as of 05/31/2016   No Known Allergies     Medication List       Accurate as of 05/31/16  3:23 PM. Always use your most recent med list.          acetaminophen 500 MG tablet Commonly known as:  TYLENOL Take 500 mg by mouth every 6 (six) hours as needed. Pain   amLODipine 10 MG tablet Commonly known as:  NORVASC Take 10 mg by mouth daily.   amoxicillin-clavulanate 600-42.9 MG/5ML suspension Commonly known as:  AUGMENTIN Take by mouth. Take 5 ml twice a day   aspirin EC 81 MG tablet Take 81 mg by mouth daily.   CERTAVITE/ANTIOXIDANTS PO Take 1 tablet by mouth every morning.   PRESERVISION  AREDS Tabs Take 2 tablets by mouth 2 (two) times daily with a meal.   famotidine 20 MG tablet Commonly known as:  PEPCID Take 20 mg by mouth at bedtime.   levothyroxine 50 MCG tablet Commonly known as:  SYNTHROID, LEVOTHROID Take 50 mcg by mouth daily before breakfast.   losartan 100 MG tablet Commonly known as:  COZAAR Take 100 mg by mouth daily.   metoprolol 100 MG tablet Commonly  known as:  LOPRESSOR Take 1 tablet twice daily to help control blood pressure   raloxifene 60 MG tablet Commonly known as:  EVISTA Take 60 mg by mouth daily.   saccharomyces boulardii 250 MG capsule Commonly known as:  FLORASTOR Take 250 mg by mouth. Take one capsule twice a day   senna-docusate 8.6-50 MG tablet Commonly known as:  Senokot-S Take 2 tablets by mouth at bedtime.   traZODone 50 MG tablet Commonly known as:  DESYREL Take 25 mg by mouth at bedtime.   Vitamin D-3 1000 units Caps Take 1,000 Units by mouth every morning.       Review of Systems  Constitutional: Negative for activity change, appetite change, chills, diaphoresis, fatigue, fever and unexpected weight change.  HENT: Positive for hearing loss. Negative for congestion, ear discharge, postnasal drip, rhinorrhea, trouble swallowing and voice change.   Eyes: Negative for pain, discharge, itching and visual disturbance.  Respiratory: Negative for cough, choking, chest tightness, shortness of breath and wheezing.   Cardiovascular: Negative for chest pain, palpitations and leg swelling.  Gastrointestinal: Negative for abdominal pain, constipation, diarrhea, nausea and vomiting.  Endocrine: Negative for cold intolerance, heat intolerance, polydipsia, polyphagia and polyuria.       Compensated hypothyroidismism  Genitourinary: Negative for dysuria, flank pain, frequency and urgency.  Musculoskeletal: Positive for arthralgias (left arm), back pain (improved) and gait problem. Negative for neck pain and neck stiffness.  Skin: Negative for pallor, rash and wound.  Allergic/Immunologic: Negative.   Neurological: Negative for tremors, syncope, speech difficulty, weakness, numbness and headaches.  Hematological: Negative.   Psychiatric/Behavioral: Positive for confusion (occasionally) and sleep disturbance. Negative for agitation, behavioral problems, dysphoric mood and hallucinations. The patient is not nervous/anxious.      Immunization History  Administered Date(s) Administered  . Influenza-Unspecified 02/15/2014, 02/16/2015, 02/22/2016  . PPD Test 03/23/2012   Pertinent  Health Maintenance Due  Topic Date Due  . DEXA SCAN  12/29/1983  . PNA vac Low Risk Adult (1 of 2 - PCV13) 12/29/1983  . INFLUENZA VACCINE  Completed   Fall Risk  02/07/2015 02/22/2014 02/22/2014  Falls in the past year? No Yes No  Number falls in past yr: - 1 -  Risk for fall due to : - History of fall(s) -     Vitals:   05/31/16 1512  BP: 136/70  Pulse: 80  Resp: 20  Temp: (!) 96.4 F (35.8 C)  SpO2: 98%  Weight: 157 lb 9.6 oz (71.5 kg)  Height: 5\' 4"  (1.626 m)   Body mass index is 27.05 kg/m. Physical Exam  Constitutional: She appears well-developed and well-nourished.  HENT:  Head: Normocephalic and atraumatic.  Severe bilateral hearing loss  Eyes: Conjunctivae and EOM are normal. Pupils are equal, round, and reactive to light.  Neck: Normal range of motion. Neck supple.  Cardiovascular:  No murmur heard. Pulmonary/Chest: Effort normal and breath sounds normal. No respiratory distress. She has no wheezes. She has no rales. She exhibits no tenderness.  Abdominal: Soft. Bowel sounds are normal. She exhibits no  distension and no mass. There is no tenderness. There is no rebound and no guarding.  Musculoskeletal: Normal range of motion. She exhibits tenderness. She exhibits no edema.  Unsteady gait. Lower back across R+L SIJ-better managed with Norco bid  Neurological: She is alert. She displays normal reflexes. No cranial nerve deficit. She exhibits normal muscle tone. Coordination normal.  Forgetful. Short-term memory deficits.  Skin: Skin is warm and dry. No rash noted. No erythema.  Psychiatric: Her behavior is normal. Thought content normal. Her mood appears not anxious. Her affect is not angry, not labile and not inappropriate. Her speech is not rapid and/or pressured, not delayed and not slurred. She is not  agitated, not aggressive, not slowed and not withdrawn. Thought content is not paranoid and not delusional. Cognition and memory are impaired. She does not express impulsivity or inappropriate judgment. Depressed: flat affect. She exhibits abnormal recent memory.    Labs reviewed:  Recent Labs  08/16/15 1128 08/21/15 01/08/16  NA 138 140 141  K 3.8 4.3 4.1  CL 103  --   --   CO2 23  --   --   GLUCOSE 197*  --   --   BUN 18 17 16   CREATININE 0.82 0.5 0.7  CALCIUM 9.4  --   --     Recent Labs  08/21/15  AST 15  ALT 19  ALKPHOS 53    Recent Labs  08/16/15 1128 08/21/15  WBC 6.8 6.4  NEUTROABS 4.1  --   HGB 14.2 13.8  HCT 43.4 41  MCV 98.9  --   PLT 173 172   Lab Results  Component Value Date   TSH 3.31 08/21/2015   Lab Results  Component Value Date   HGBA1C 6.7 01/08/2016   No results found for: CHOL, HDL, LDLCALC, LDLDIRECT, TRIG, CHOLHDL  Significant Diagnostic Results in last 30 days:  No results found.  Assessment/Plan 1. Essential hypertension controlled  2. Hypothyroidism, unspecified type compensated  3. Memory deficit unchanged

## 2016-06-04 DIAGNOSIS — M6281 Muscle weakness (generalized): Secondary | ICD-10-CM | POA: Diagnosis not present

## 2016-06-04 DIAGNOSIS — R41841 Cognitive communication deficit: Secondary | ICD-10-CM | POA: Diagnosis not present

## 2016-06-04 DIAGNOSIS — R29898 Other symptoms and signs involving the musculoskeletal system: Secondary | ICD-10-CM | POA: Diagnosis not present

## 2016-06-04 DIAGNOSIS — F0280 Dementia in other diseases classified elsewhere without behavioral disturbance: Secondary | ICD-10-CM | POA: Diagnosis not present

## 2016-06-04 DIAGNOSIS — R296 Repeated falls: Secondary | ICD-10-CM | POA: Diagnosis not present

## 2016-06-04 DIAGNOSIS — R2681 Unsteadiness on feet: Secondary | ICD-10-CM | POA: Diagnosis not present

## 2016-06-11 NOTE — Addendum Note (Signed)
Addended by: Estill Dooms on: 06/11/2016 03:32 PM   Modules accepted: Level of Service

## 2016-06-13 DIAGNOSIS — S066X0A Traumatic subarachnoid hemorrhage without loss of consciousness, initial encounter: Secondary | ICD-10-CM | POA: Diagnosis not present

## 2016-06-13 DIAGNOSIS — M6281 Muscle weakness (generalized): Secondary | ICD-10-CM | POA: Diagnosis not present

## 2016-06-13 DIAGNOSIS — R296 Repeated falls: Secondary | ICD-10-CM | POA: Diagnosis not present

## 2016-06-13 DIAGNOSIS — H3530 Unspecified macular degeneration: Secondary | ICD-10-CM | POA: Diagnosis not present

## 2016-06-13 DIAGNOSIS — S42409A Unspecified fracture of lower end of unspecified humerus, initial encounter for closed fracture: Secondary | ICD-10-CM | POA: Diagnosis not present

## 2016-06-13 DIAGNOSIS — F0280 Dementia in other diseases classified elsewhere without behavioral disturbance: Secondary | ICD-10-CM | POA: Diagnosis not present

## 2016-06-13 DIAGNOSIS — I10 Essential (primary) hypertension: Secondary | ICD-10-CM | POA: Diagnosis not present

## 2016-06-13 DIAGNOSIS — M810 Age-related osteoporosis without current pathological fracture: Secondary | ICD-10-CM | POA: Diagnosis not present

## 2016-06-13 DIAGNOSIS — H35059 Retinal neovascularization, unspecified, unspecified eye: Secondary | ICD-10-CM | POA: Diagnosis not present

## 2016-06-13 DIAGNOSIS — M25579 Pain in unspecified ankle and joints of unspecified foot: Secondary | ICD-10-CM | POA: Diagnosis not present

## 2016-06-13 DIAGNOSIS — H9190 Unspecified hearing loss, unspecified ear: Secondary | ICD-10-CM | POA: Diagnosis not present

## 2016-06-13 DIAGNOSIS — M545 Low back pain: Secondary | ICD-10-CM | POA: Diagnosis not present

## 2016-06-13 DIAGNOSIS — R41841 Cognitive communication deficit: Secondary | ICD-10-CM | POA: Diagnosis not present

## 2016-06-13 DIAGNOSIS — M159 Polyosteoarthritis, unspecified: Secondary | ICD-10-CM | POA: Diagnosis not present

## 2016-06-13 DIAGNOSIS — W19XXXA Unspecified fall, initial encounter: Secondary | ICD-10-CM | POA: Diagnosis not present

## 2016-06-13 DIAGNOSIS — R2681 Unsteadiness on feet: Secondary | ICD-10-CM | POA: Diagnosis not present

## 2016-06-13 DIAGNOSIS — G4700 Insomnia, unspecified: Secondary | ICD-10-CM | POA: Diagnosis not present

## 2016-06-13 DIAGNOSIS — R29898 Other symptoms and signs involving the musculoskeletal system: Secondary | ICD-10-CM | POA: Diagnosis not present

## 2016-06-21 DIAGNOSIS — I10 Essential (primary) hypertension: Secondary | ICD-10-CM | POA: Diagnosis not present

## 2016-06-21 DIAGNOSIS — S066X0A Traumatic subarachnoid hemorrhage without loss of consciousness, initial encounter: Secondary | ICD-10-CM | POA: Diagnosis not present

## 2016-06-21 DIAGNOSIS — H9190 Unspecified hearing loss, unspecified ear: Secondary | ICD-10-CM | POA: Diagnosis not present

## 2016-06-21 DIAGNOSIS — I609 Nontraumatic subarachnoid hemorrhage, unspecified: Secondary | ICD-10-CM | POA: Diagnosis not present

## 2016-06-21 DIAGNOSIS — H3530 Unspecified macular degeneration: Secondary | ICD-10-CM | POA: Diagnosis not present

## 2016-06-21 DIAGNOSIS — R296 Repeated falls: Secondary | ICD-10-CM | POA: Diagnosis not present

## 2016-06-21 DIAGNOSIS — M6281 Muscle weakness (generalized): Secondary | ICD-10-CM | POA: Diagnosis not present

## 2016-06-21 DIAGNOSIS — M810 Age-related osteoporosis without current pathological fracture: Secondary | ICD-10-CM | POA: Diagnosis not present

## 2016-06-21 DIAGNOSIS — M159 Polyosteoarthritis, unspecified: Secondary | ICD-10-CM | POA: Diagnosis not present

## 2016-06-21 DIAGNOSIS — W19XXXA Unspecified fall, initial encounter: Secondary | ICD-10-CM | POA: Diagnosis not present

## 2016-06-21 DIAGNOSIS — M1990 Unspecified osteoarthritis, unspecified site: Secondary | ICD-10-CM | POA: Diagnosis not present

## 2016-06-21 DIAGNOSIS — M25579 Pain in unspecified ankle and joints of unspecified foot: Secondary | ICD-10-CM | POA: Diagnosis not present

## 2016-06-21 DIAGNOSIS — H26103 Unspecified traumatic cataract, bilateral: Secondary | ICD-10-CM | POA: Diagnosis not present

## 2016-06-21 DIAGNOSIS — H35059 Retinal neovascularization, unspecified, unspecified eye: Secondary | ICD-10-CM | POA: Diagnosis not present

## 2016-06-21 DIAGNOSIS — S42409A Unspecified fracture of lower end of unspecified humerus, initial encounter for closed fracture: Secondary | ICD-10-CM | POA: Diagnosis not present

## 2016-06-21 DIAGNOSIS — M545 Low back pain: Secondary | ICD-10-CM | POA: Diagnosis not present

## 2016-06-21 DIAGNOSIS — G4700 Insomnia, unspecified: Secondary | ICD-10-CM | POA: Diagnosis not present

## 2016-06-21 DIAGNOSIS — R41841 Cognitive communication deficit: Secondary | ICD-10-CM | POA: Diagnosis not present

## 2016-06-21 DIAGNOSIS — F0280 Dementia in other diseases classified elsewhere without behavioral disturbance: Secondary | ICD-10-CM | POA: Diagnosis not present

## 2016-06-21 DIAGNOSIS — E039 Hypothyroidism, unspecified: Secondary | ICD-10-CM | POA: Diagnosis not present

## 2016-06-21 DIAGNOSIS — R2681 Unsteadiness on feet: Secondary | ICD-10-CM | POA: Diagnosis not present

## 2016-06-21 DIAGNOSIS — R29898 Other symptoms and signs involving the musculoskeletal system: Secondary | ICD-10-CM | POA: Diagnosis not present

## 2016-06-24 DIAGNOSIS — F0280 Dementia in other diseases classified elsewhere without behavioral disturbance: Secondary | ICD-10-CM | POA: Diagnosis not present

## 2016-06-24 DIAGNOSIS — H3530 Unspecified macular degeneration: Secondary | ICD-10-CM | POA: Diagnosis not present

## 2016-06-24 DIAGNOSIS — I10 Essential (primary) hypertension: Secondary | ICD-10-CM | POA: Diagnosis not present

## 2016-06-24 DIAGNOSIS — R41841 Cognitive communication deficit: Secondary | ICD-10-CM | POA: Diagnosis not present

## 2016-06-24 DIAGNOSIS — M6281 Muscle weakness (generalized): Secondary | ICD-10-CM | POA: Diagnosis not present

## 2016-06-24 DIAGNOSIS — E039 Hypothyroidism, unspecified: Secondary | ICD-10-CM | POA: Diagnosis not present

## 2016-06-26 ENCOUNTER — Emergency Department (HOSPITAL_COMMUNITY): Payer: Medicare Other

## 2016-06-26 ENCOUNTER — Encounter (HOSPITAL_COMMUNITY): Payer: Self-pay | Admitting: Emergency Medicine

## 2016-06-26 ENCOUNTER — Emergency Department (HOSPITAL_COMMUNITY)
Admission: EM | Admit: 2016-06-26 | Discharge: 2016-06-26 | Disposition: A | Discharge status: Home / Self Care | Payer: Medicare Other | Attending: Emergency Medicine | Admitting: Emergency Medicine

## 2016-06-26 DIAGNOSIS — Z79899 Other long term (current) drug therapy: Secondary | ICD-10-CM | POA: Insufficient documentation

## 2016-06-26 DIAGNOSIS — Y999 Unspecified external cause status: Secondary | ICD-10-CM | POA: Insufficient documentation

## 2016-06-26 DIAGNOSIS — Z7982 Long term (current) use of aspirin: Secondary | ICD-10-CM | POA: Diagnosis not present

## 2016-06-26 DIAGNOSIS — I10 Essential (primary) hypertension: Secondary | ICD-10-CM | POA: Insufficient documentation

## 2016-06-26 DIAGNOSIS — S0101XA Laceration without foreign body of scalp, initial encounter: Secondary | ICD-10-CM | POA: Diagnosis not present

## 2016-06-26 DIAGNOSIS — Y9389 Activity, other specified: Secondary | ICD-10-CM | POA: Diagnosis not present

## 2016-06-26 DIAGNOSIS — S0093XA Contusion of unspecified part of head, initial encounter: Secondary | ICD-10-CM | POA: Diagnosis not present

## 2016-06-26 DIAGNOSIS — W01198A Fall on same level from slipping, tripping and stumbling with subsequent striking against other object, initial encounter: Secondary | ICD-10-CM | POA: Insufficient documentation

## 2016-06-26 DIAGNOSIS — S0990XA Unspecified injury of head, initial encounter: Secondary | ICD-10-CM | POA: Diagnosis present

## 2016-06-26 DIAGNOSIS — R41841 Cognitive communication deficit: Secondary | ICD-10-CM | POA: Diagnosis not present

## 2016-06-26 DIAGNOSIS — Y9289 Other specified places as the place of occurrence of the external cause: Secondary | ICD-10-CM | POA: Diagnosis not present

## 2016-06-26 DIAGNOSIS — S199XXA Unspecified injury of neck, initial encounter: Secondary | ICD-10-CM | POA: Diagnosis not present

## 2016-06-26 DIAGNOSIS — F0280 Dementia in other diseases classified elsewhere without behavioral disturbance: Secondary | ICD-10-CM | POA: Diagnosis not present

## 2016-06-26 DIAGNOSIS — E039 Hypothyroidism, unspecified: Secondary | ICD-10-CM | POA: Diagnosis not present

## 2016-06-26 DIAGNOSIS — H3530 Unspecified macular degeneration: Secondary | ICD-10-CM | POA: Diagnosis not present

## 2016-06-26 DIAGNOSIS — M6281 Muscle weakness (generalized): Secondary | ICD-10-CM | POA: Diagnosis not present

## 2016-06-26 HISTORY — DX: Unspecified dementia, unspecified severity, without behavioral disturbance, psychotic disturbance, mood disturbance, and anxiety: F03.90

## 2016-06-26 MED ORDER — LIDOCAINE-EPINEPHRINE (PF) 2 %-1:200000 IJ SOLN
10.0000 mL | Freq: Once | INTRAMUSCULAR | Status: AC
  Administered 2016-06-26: 10 mL via INTRADERMAL
  Filled 2016-06-26: qty 20

## 2016-06-26 MED ORDER — BACITRACIN ZINC 500 UNIT/GM EX OINT
TOPICAL_OINTMENT | CUTANEOUS | Status: AC
  Administered 2016-06-26: 1
  Filled 2016-06-26: qty 0.9

## 2016-06-26 MED ORDER — BACITRACIN ZINC 500 UNIT/GM EX OINT
TOPICAL_OINTMENT | CUTANEOUS | Status: AC
  Administered 2016-06-26: 2
  Filled 2016-06-26: qty 0.9

## 2016-06-26 MED ORDER — TRAMADOL HCL 50 MG PO TABS: 50.0000 mg | ORAL_TABLET | Freq: Four times a day (QID) | ORAL | 0 refills | Status: DC | PRN

## 2016-06-26 NOTE — ED Notes (Signed)
Lidocaine suture tray and T-35 stapler at bedside

## 2016-06-26 NOTE — ED Provider Notes (Signed)
[facility] DEPT Provider Note   CSN: [id number] Arrival date & time: 06/26/16  1826     History   Chief Complaint Chief Complaint  Patient presents with  . Fall  . Head Injury    HPI Melissa Carroll is a 83 y.o. female with a past medical history significant for dementia, hypertension, prior subarachnoid hemorrhage, prior vertebral fractures, and numerous falls who presents with a mechanical fall today. Patient is accompanied by her daughter who reports that the patient was using her walker on the sidewalk while getting into a vehicle and she slipped and fell hitting her head on the concrete. The daughter reports the patient did not lose consciousness and has a small laceration to the back of her head. She is unsure of her last tetanus vaccination. The patient is reporting headache and some neck pain. She denies any neurologic planes. She denies any preceding symptoms such as palpations, chest pain or shortness of breath, leg swelling, leg pain, nausea, vomiting. Patient denies any visual changes or other symptoms today. Patient reports her headache is mild with no other symptoms.       HPI  Past Medical History:  Diagnosis Date  . Arthritis   . Cataracts, bilateral   . Closed fracture of unspecified part of lower end of humerus   . Dementia   . Hypertension   . Insomnia, unspecified   . Macular degeneration (senile) of retina, unspecified   . Osteoarthritis   . Osteoarthrosis, unspecified whether generalized or localized, unspecified site   . Pancreatitis, acute 08/14/2013  . Retinal neovascularization NOS   . Senile osteoporosis   . Subarachnoid hemorrhage following injury ([place]) 03/31/2012  . Unspecified hearing loss   . Unspecified hypothyroidism   . Urinary tract infection 08/10/2013  . Vertebral fracture 08/09/2013   T7 10%, T12 70%     Patient Active Problem List   Diagnosis Date Noted  . Urine incontinence 01/04/2016  . Hypertensive retinopathy 01/24/2015  .  Posterior vitreous detachment 01/24/2015  . Pseudoaphakia 01/24/2015  . Chorioretinal scar, macular 10/04/2014  . Lower back pain 07/19/2014  . Memory deficit 01/07/2014  . GERD (gastroesophageal reflux disease) 09/14/2013  . Musculoskeletal pain 08/14/2013  . Hyperglycemia 08/14/2013  . Senile osteoporosis 08/10/2013  . Fracture, vertebra, pathologic 08/09/2013  . AMD (age-related macular degeneration), wet ([place]) 02/18/2013  . Constipation 09/11/2012  . Hypothyroidism 08/14/2012  . Osteoarthritis 08/14/2012  . Insomnia 08/14/2012  . Hypertension 04/02/2012  . Fall 03/31/2012  . Hard of hearing 03/31/2012  . Subarachnoid hemorrhage following injury ([place]) 03/31/2012    Past Surgical History:  Procedure Laterality Date  . ABDOMINAL HYSTERECTOMY  1942  . ANKLE FRACTURE SURGERY  1960  . BLADDER SUSPENSION    . CLOSED REDUCTION WITH HUMERAL PIN INSERTION  04/01/2012   Procedure: CLOSED REDUCTION WITH HUMERAL PIN INSERTION;  Surgeon: Schuyler Amor, MD;  Location: [facility] ORS;  Service: Orthopedics;  Laterality: Left;    OB History    No data available       Home Medications    Prior to Admission medications   Medication Sig Start Date End Date Taking? Authorizing Provider  acetaminophen (TYLENOL) 500 MG tablet Take 500 mg by mouth every 6 (six) hours as needed. Pain    Historical Provider, MD  amLODipine (NORVASC) 10 MG tablet Take 10 mg by mouth daily.    Historical Provider, MD  amoxicillin-clavulanate (AUGMENTIN) 600-42.9 MG/5ML suspension Take by mouth. Take 5 ml twice a day  Historical Provider, MD  aspirin EC 81 MG tablet Take 81 mg by mouth daily.    Historical Provider, MD  Cholecalciferol (VITAMIN D-3) 1000 UNITS CAPS Take 1,000 Units by mouth every morning.    Historical Provider, MD  famotidine (PEPCID) 20 MG tablet Take 20 mg by mouth at bedtime.    Historical Provider, MD  levothyroxine (SYNTHROID, LEVOTHROID) 50 MCG tablet Take 50 mcg by mouth daily before  breakfast.    Historical Provider, MD  losartan (COZAAR) 100 MG tablet Take 100 mg by mouth daily.    Historical Provider, MD  metoprolol (LOPRESSOR) 100 MG tablet Take 1 tablet twice daily to help control blood pressure 01/04/16   Estill Dooms, MD  Multiple Vitamins-Minerals (CERTAVITE/ANTIOXIDANTS PO) Take 1 tablet by mouth every morning.    Historical Provider, MD  Multiple Vitamins-Minerals (PRESERVISION AREDS) TABS Take 2 tablets by mouth 2 (two) times daily with a meal. 01/18/16   Historical Provider, MD  raloxifene (EVISTA) 60 MG tablet Take 60 mg by mouth daily.    Historical Provider, MD  senna-docusate (SENOKOT-S) 8.6-50 MG per tablet Take 2 tablets by mouth at bedtime.    Historical Provider, MD  traZODone (DESYREL) 50 MG tablet Take 25 mg by mouth at bedtime.     Historical Provider, MD    Family History Family History  Problem Relation Age of Onset  . Hypertension Mother     Social History Social History  Substance Use Topics  . Smoking status: Never Smoker  . Smokeless tobacco: Never Used  . Alcohol use No     Allergies   Patient has no known allergies.   Review of Systems Review of Systems  Constitutional: Negative for activity change, chills, diaphoresis, fatigue and fever.  HENT: Negative for congestion and rhinorrhea.   Eyes: Negative for visual disturbance.  Respiratory: Negative for cough, chest tightness, shortness of breath and stridor.   Cardiovascular: Negative for chest pain, palpitations and leg swelling.  Gastrointestinal: Negative for abdominal distention, abdominal pain, constipation, diarrhea, nausea and vomiting.  Genitourinary: Negative for difficulty urinating, dysuria, flank pain, frequency, hematuria, menstrual problem, pelvic pain, vaginal bleeding and vaginal discharge.  Musculoskeletal: Negative for back pain and neck pain.  Skin: Positive for wound. Negative for rash.  Neurological: Positive for headaches. Negative for dizziness, weakness,  light-headedness and numbness.  Psychiatric/Behavioral: Negative for agitation and confusion.  All other systems reviewed and are negative.    Physical Exam Updated Vital Signs BP 158/91 (BP Location: Left Arm)   Pulse 74   Temp 97.3 F (36.3 C) (Oral)   Resp 18   SpO2 96%   Physical Exam  Constitutional: She is oriented to person, place, and time. She appears well-developed and well-nourished. No distress.  HENT:  Head: Head is with laceration.    Right Ear: External ear normal.  Left Ear: External ear normal.  Nose: Nose normal.  Mouth/Throat: Oropharynx is clear and moist. No oropharyngeal exudate.  2cm laceration to scalp. Mild bleeding. Not deep. No foreign bodies seen.    Eyes: Conjunctivae and EOM are normal. Pupils are equal, round, and reactive to light.  Neck: Normal range of motion. Neck supple.  Pulmonary/Chest: No stridor. No respiratory distress.  Abdominal: She exhibits no distension. There is no tenderness. There is no rebound.  Neurological: She is alert and oriented to person, place, and time. She has normal reflexes. She exhibits normal muscle tone. Coordination normal.  Skin: Skin is warm. No rash noted. She is not diaphoretic.  No erythema.  Nursing note and vitals reviewed.    ED Treatments / Results  Labs (all labs ordered are listed, but only abnormal results are displayed) Labs Reviewed - No data to display  EKG  EKG Interpretation None       Radiology Ct Head Wo Contrast  Result Date: 06/26/2016 CLINICAL DATA:  [place] trying to ensure a car. Striking the back of the head on concrete. Soft tissue injury. EXAM: CT HEAD WITHOUT CONTRAST CT CERVICAL SPINE WITHOUT CONTRAST TECHNIQUE: Multidetector CT imaging of the head and cervical spine was performed following the standard protocol without intravenous contrast. Multiplanar CT image reconstructions of the cervical spine were also generated. COMPARISON:  08/16/2015 FINDINGS: CT HEAD FINDINGS  Brain: Generalized atrophy. Extensive chronic small-vessel ischemic changes affecting the hemispheric white matter. Old small vessel infarctions in the cerebellum. No evidence of acute infarction, mass lesion, hemorrhage, hydrocephalus or extra-axial collection. Right frontal calcification measuring 1 cm probably represents an old calcified meningioma of no significance. Vascular: There is atherosclerotic calcification of the major vessels at the base of the brain. Skull: No skull fracture or significant calvarial lesion. Sinuses/Orbits: Clear/normal Other: None significant CT CERVICAL SPINE FINDINGS Alignment: No traumatic finding. Chronic osteoarthritis of the C1-2 articulation. Ordinary cervical spondylosis with mild osteophytic encroachment upon the canal and foramina. No advanced facet arthropathy. Skull base and vertebrae: Negative Soft tissues and spinal canal: None significant Upper chest: Limited visualization.  Mild scarring. Other: None significant IMPRESSION: Head CT: No acute or traumatic finding. Atrophy an extensive chronic small vessel ischemic changes. Cervical spine CT: No acute or traumatic finding. Ordinary mild degenerative changes. Electronically Signed   By: Nelson Chimes M.D.   On: 06/26/2016 19:48   Ct Cervical Spine Wo Contrast  Result Date: 06/26/2016 CLINICAL DATA:  [place] trying to ensure a car. Striking the back of the head on concrete. Soft tissue injury. EXAM: CT HEAD WITHOUT CONTRAST CT CERVICAL SPINE WITHOUT CONTRAST TECHNIQUE: Multidetector CT imaging of the head and cervical spine was performed following the standard protocol without intravenous contrast. Multiplanar CT image reconstructions of the cervical spine were also generated. COMPARISON:  08/16/2015 FINDINGS: CT HEAD FINDINGS Brain: Generalized atrophy. Extensive chronic small-vessel ischemic changes affecting the hemispheric white matter. Old small vessel infarctions in the cerebellum. No evidence of acute infarction,  mass lesion, hemorrhage, hydrocephalus or extra-axial collection. Right frontal calcification measuring 1 cm probably represents an old calcified meningioma of no significance. Vascular: There is atherosclerotic calcification of the major vessels at the base of the brain. Skull: No skull fracture or significant calvarial lesion. Sinuses/Orbits: Clear/normal Other: None significant CT CERVICAL SPINE FINDINGS Alignment: No traumatic finding. Chronic osteoarthritis of the C1-2 articulation. Ordinary cervical spondylosis with mild osteophytic encroachment upon the canal and foramina. No advanced facet arthropathy. Skull base and vertebrae: Negative Soft tissues and spinal canal: None significant Upper chest: Limited visualization.  Mild scarring. Other: None significant IMPRESSION: Head CT: No acute or traumatic finding. Atrophy an extensive chronic small vessel ischemic changes. Cervical spine CT: No acute or traumatic finding. Ordinary mild degenerative changes. Electronically Signed   By: Nelson Chimes M.D.   On: 06/26/2016 19:48    Procedures .Marland KitchenLaceration Repair Date/Time: 06/27/2016 1:17 AM Performed by: Courtney Paris Authorized by: Courtney Paris   Consent:    Consent obtained:  Verbal   Consent given by:  Patient   Risks discussed:  Infection, pain, poor cosmetic result and poor wound healing   Alternatives discussed:  No treatment Anesthesia (  see MAR for exact dosages):    Anesthesia method:  Local infiltration   Local anesthetic:  Lidocaine 1% WITH epi Laceration details:    Location:  Scalp   Scalp location:  Occipital   Length (cm):  2   Depth (mm):  1 Repair type:    Repair type:  Simple Pre-procedure details:    Preparation:  Patient was prepped and draped in usual sterile fashion and imaging obtained to evaluate for foreign bodies Exploration:    Contaminated: no   Treatment:    Area cleansed with:  Saline   Amount of cleaning:  Standard   Irrigation solution:   Sterile saline   Irrigation volume:  200   Irrigation method:  Syringe   Visualized foreign bodies/material removed: no   Skin repair:    Repair method:  Staples   Number of staples:  4 Approximation:    Approximation:  Close   Vermilion border: well-aligned   Post-procedure details:    Dressing:  Antibiotic ointment   Patient tolerance of procedure:  Tolerated well, no immediate complications    (including critical care time)  Medications Ordered in ED Medications  lidocaine-EPINEPHrine (XYLOCAINE W/EPI) 2 %-1:200000 (PF) injection 10 mL (10 mLs Intradermal Given 06/26/16 2250)  bacitracin 500 UNIT/GM ointment (2 application  Given [id number])  bacitracin 500 UNIT/GM ointment (1 application  Given [id number])     Initial Impression / Assessment and Plan / ED Course  I have reviewed the triage vital signs and the nursing notes.  Pertinent labs & imaging results that were available during my care of the patient were reviewed by me and considered in my medical decision making (see chart for details).     EVOLETT KOTLARZ is a 83 y.o. female with a past medical history significant for dementia, hypertension, prior subarachnoid hemorrhage, prior vertebral fractures, and numerous falls who presents with a mechanical fall today.  History and exam are seen above. Patient found to have a hematoma with a small laceration present on the back of her head. Hemostatic on arrival. Patient has some neck pain. Lungs are clear, abdomen nontender, and no focal neurologic deficits.. Patient will have CT of the head and neck. Wound will then be addressed.  Daughter says that due to the patient's not having any preceding symptoms, she does not wish to pursue a more sensitive medical workup at this time but rather focus on looking for traumatic injuries and treating the wound.  CT imaging showed no evidence of acute traumatic intracranial injury. No evidence of new cervical spine injuries.  After  the reassuring imaging, patient's neck roll was removed. Patient then had her wound anesthetized with lidocaine, washed, and staples were placed as described above.  Patient tolerated the procedure without difficulty.  Patient felt appropriate for discharge given her baseline mental status, no other complaints. Patient's family agreed to plan for discharge.  Patient will follow-up with PCP in 7-10 days for staple removal. Strict return precautions were given for new or worsened head injury symptoms. She discharged in good condition with resolution of headache and for laceration repair completed. Patient discharged in good condition.    Final Clinical Impressions(s) / ED Diagnoses   Final diagnoses:  Injury of head, initial encounter  Laceration of scalp without foreign body, initial encounter    New Prescriptions Discharge Medication List as of 06/26/2016 11:02 PM    START taking these medications   Details  traMADol (ULTRAM) 50 MG tablet Take 1 tablet (50  mg total) by mouth every 6 (six) hours as needed., Starting Wed 06/26/2016, Print        Clinical Impression: 1. Injury of head, initial encounter   2. Laceration of scalp without foreign body, initial encounter     Disposition: Discharge  Condition: Good  I have discussed the results, Dx and Tx plan with the pt(& family if present). He/she/they expressed understanding and agree(s) with the plan. Discharge instructions discussed at great length. Strict return precautions discussed and pt &/or family have verbalized understanding of the instructions. No further questions at time of discharge.    Discharge Medication List as of 06/26/2016 11:02 PM    START taking these medications   Details  traMADol (ULTRAM) 50 MG tablet Take 1 tablet (50 mg total) by mouth every 6 (six) hours as needed., Starting Wed 06/26/2016, Print        Follow Up: Estill Dooms, MD [place] [address] [phone]         Gwenyth Allegra Tegeler, MD 06/27/16 [phone]

## 2016-06-26 NOTE — ED Triage Notes (Signed)
Per EMS, pt fell while trying to sit in a car. Pt hit the back of her head on concrete. Pt has skin tear and hematoma to posterior head. Pt did not lose consciousness. Pt is not on blood thinners.  Pt does not complain of neck or back pain. Pt is hard of hearing, has hx of dementia. Daughter states pt is at baseline.   BP 168/80 HR 78 RR18 CBG 234, pt is not diabetic

## 2016-06-26 NOTE — ED Notes (Signed)
Bed: [id number] Expected date:  Expected time:  Means of arrival:  Comments: 83 yo f fall

## 2016-06-26 NOTE — ED Notes (Signed)
Patient transported to X-ray 

## 2016-06-28 ENCOUNTER — Non-Acute Institutional Stay (SKILLED_NURSING_FACILITY): Payer: Medicare Other | Admitting: Nurse Practitioner

## 2016-06-28 ENCOUNTER — Encounter: Payer: Self-pay | Admitting: Nurse Practitioner

## 2016-06-28 DIAGNOSIS — I10 Essential (primary) hypertension: Secondary | ICD-10-CM

## 2016-06-28 DIAGNOSIS — S0101XA Laceration without foreign body of scalp, initial encounter: Secondary | ICD-10-CM | POA: Insufficient documentation

## 2016-06-28 DIAGNOSIS — R413 Other amnesia: Secondary | ICD-10-CM

## 2016-06-28 DIAGNOSIS — K219 Gastro-esophageal reflux disease without esophagitis: Secondary | ICD-10-CM

## 2016-06-28 DIAGNOSIS — R41841 Cognitive communication deficit: Secondary | ICD-10-CM | POA: Diagnosis not present

## 2016-06-28 DIAGNOSIS — M6281 Muscle weakness (generalized): Secondary | ICD-10-CM | POA: Diagnosis not present

## 2016-06-28 DIAGNOSIS — E039 Hypothyroidism, unspecified: Secondary | ICD-10-CM | POA: Diagnosis not present

## 2016-06-28 DIAGNOSIS — W19XXXA Unspecified fall, initial encounter: Secondary | ICD-10-CM | POA: Diagnosis not present

## 2016-06-28 DIAGNOSIS — F5101 Primary insomnia: Secondary | ICD-10-CM

## 2016-06-28 DIAGNOSIS — H3530 Unspecified macular degeneration: Secondary | ICD-10-CM | POA: Diagnosis not present

## 2016-06-28 DIAGNOSIS — K5900 Constipation, unspecified: Secondary | ICD-10-CM

## 2016-06-28 DIAGNOSIS — S0101XS Laceration without foreign body of scalp, sequela: Secondary | ICD-10-CM | POA: Diagnosis not present

## 2016-06-28 DIAGNOSIS — F0280 Dementia in other diseases classified elsewhere without behavioral disturbance: Secondary | ICD-10-CM | POA: Diagnosis not present

## 2016-06-28 NOTE — Assessment & Plan Note (Signed)
Memory lapses, MMSE, SNF for care assistance.  

## 2016-06-28 NOTE — Assessment & Plan Note (Signed)
Stable, continue Famotidine 20mg daily.   

## 2016-06-28 NOTE — Progress Notes (Signed)
Location:  [place] Room Number: 2 Place of Service:  SNF (31) Provider: Mast, Manxie  NP  Jeanmarie Hubert, MD  Patient Care Team: Estill Dooms, MD as PCP - General (Internal Medicine) Man Otho Darner, NP as Nurse Practitioner (Nurse Practitioner) Charlotte Crumb, MD as Consulting Physician (Orthopedic Surgery) Wallene Huh, DPM as Consulting Physician (Podiatry) Gaynelle Arabian, MD as Consulting Physician (Orthopedic Surgery) Zebedee Iba, MD as Referring Physician (Ophthalmology) Newman Pies, MD as Consulting Physician (Neurosurgery) [facility])  Extended Emergency Contact Information Primary Emergency Contact: [place] of [place] Phone: [phone] Mobile Phone: [phone] Relation: Daughter Secondary Emergency Contact: [place] of [place] Phone: [phone] Relation: Grandson  Code Status:  DNR Goals of care: Advanced Directive information Advanced Directives 06/28/2016  Does Patient Have a Medical Advance Directive? Yes  Type of Paramedic of [place];Out of facility DNR (pink MOST or yellow form);Living will  Does patient want to make changes to medical advance directive? No - Patient declined  Copy of [place] in Chart? Yes  Pre-existing out of facility DNR order (yellow form or pink MOST form) Yellow form placed in chart (order not valid for inpatient use)     Chief Complaint  Patient presents with  . Readmit To SNF    hosp. f/u     HPI:  Pt is a 83 y.o. female seen today for f/u ED evaluation 2/141/8 for head contusion sustained from falling at [facility], it was mechanical fall, sutures need to be removed in 2 weeks. CT head and cervical spine showed no acute or traumatic findings.    Hx of GERD, stable while on Famotidine 20mg  daily, memory lapses, insomnia, sleeps with aid of Trazodone 25mg ,  back pain, better, ambulates with walker, prn Tramadol 50mg  q6h available to her, thyroid, taking Levothyroxine 73mcg, last TSH 3.31 08/21/15, HTN, controlled on Amlodipine 10mg , Losartan 100mg , Metoprolol 100mg  bid. Not constipation, taking Senna II qhs.  Past Medical History:  Diagnosis Date  . Arthritis   . Cataracts, bilateral   . Closed fracture of unspecified part of lower end of humerus   . Dementia   . Hypertension   . Insomnia, unspecified   . Macular degeneration (senile) of retina, unspecified   . Osteoarthritis   . Osteoarthrosis, unspecified whether generalized or localized, unspecified site   . Pancreatitis, acute 08/14/2013  . Retinal neovascularization NOS   . Senile osteoporosis   . Subarachnoid hemorrhage following injury ([place]) 03/31/2012  . Unspecified hearing loss   . Unspecified hypothyroidism   . Urinary tract infection 08/10/2013  . Vertebral fracture 08/09/2013   T7 10%, T12 70%    Past Surgical History:  Procedure Laterality Date  . ABDOMINAL HYSTERECTOMY  1942  . ANKLE FRACTURE SURGERY  1960  . BLADDER SUSPENSION    . CLOSED REDUCTION WITH HUMERAL PIN INSERTION  04/01/2012   Procedure: CLOSED REDUCTION WITH HUMERAL PIN INSERTION;  Surgeon: Schuyler Amor, MD;  Location: [facility] ORS;  Service: Orthopedics;  Laterality: Left;    No Known Allergies  Allergies as of 06/28/2016   No Known Allergies     Medication List       Accurate as of 06/28/16  3:39 PM. Always use your most recent med list.          acetaminophen 500 MG tablet Commonly known as:  TYLENOL Take 500 mg by mouth every 6 (  six) hours as needed. Pain   amLODipine 10 MG tablet Commonly known as:  NORVASC Take 10 mg by mouth daily.   aspirin EC 81 MG tablet Take 81 mg by mouth daily.   famotidine 20 MG tablet Commonly known as:  PEPCID Take 20 mg by mouth at bedtime.   levothyroxine 50 MCG tablet Commonly known as:  SYNTHROID, LEVOTHROID Take 50 mcg by mouth daily before  breakfast.   losartan 100 MG tablet Commonly known as:  COZAAR Take 100 mg by mouth daily.   metoprolol 100 MG tablet Commonly known as:  LOPRESSOR Take 1 tablet twice daily to help control blood pressure   raloxifene 60 MG tablet Commonly known as:  EVISTA Take 60 mg by mouth daily.   senna-docusate 8.6-50 MG tablet Commonly known as:  Senokot-S Take 2 tablets by mouth at bedtime.   traMADol 50 MG tablet Commonly known as:  ULTRAM Take 1 tablet (50 mg total) by mouth every 6 (six) hours as needed.   traZODone 50 MG tablet Commonly known as:  DESYREL Take 25 mg by mouth at bedtime.       Review of Systems  Constitutional: Negative for activity change, appetite change, chills, diaphoresis, fatigue, fever and unexpected weight change.  HENT: Positive for hearing loss. Negative for congestion, ear discharge, postnasal drip, rhinorrhea, trouble swallowing and voice change.   Eyes: Negative for pain, discharge, itching and visual disturbance.  Respiratory: Negative for cough, choking, chest tightness, shortness of breath and wheezing.   Cardiovascular: Negative for chest pain, palpitations and leg swelling.  Gastrointestinal: Negative for abdominal pain, constipation, diarrhea, nausea and vomiting.  Endocrine: Negative for cold intolerance, heat intolerance, polydipsia, polyphagia and polyuria.       Compensated hypothyroidismism  Genitourinary: Negative for dysuria, flank pain, frequency and urgency.  Musculoskeletal: Positive for arthralgias (left arm), back pain (improved) and gait problem. Negative for neck pain and neck stiffness.  Skin: Negative for pallor, rash and wound.  Allergic/Immunologic: Negative.   Neurological: Negative for tremors, syncope, speech difficulty, weakness, numbness and headaches.  Hematological: Negative.   Psychiatric/Behavioral: Positive for confusion (occasionally) and sleep disturbance. Negative for agitation, behavioral problems, dysphoric  mood and hallucinations. The patient is not nervous/anxious.     Immunization History  Administered Date(s) Administered  . Influenza-Unspecified 02/15/2014, 02/16/2015, 02/22/2016  . PPD Test 03/23/2012   Pertinent  Health Maintenance Due  Topic Date Due  . DEXA SCAN  12/29/1983  . PNA vac Low Risk Adult (1 of 2 - PCV13) 12/29/1983  . INFLUENZA VACCINE  Completed   Fall Risk  02/07/2015 02/22/2014 02/22/2014  Falls in the past year? No Yes No  Number falls in past yr: - 1 -  Risk for fall due to : - History of fall(s) -   Functional Status Survey:    Vitals:   06/28/16 1404  BP: 120/60  Pulse: 64  Resp: 20  Temp: 98.9 F (37.2 C)  SpO2: 96%  Weight: 158 lb (71.7 kg)  Height: 5\' 4"  (1.626 m)   Body mass index is 27.12 kg/m. Physical Exam  Constitutional: She appears well-developed and well-nourished.  HENT:  Head: Normocephalic and atraumatic.  Severe bilateral hearing loss  Eyes: Conjunctivae and EOM are normal. Pupils are equal, round, and reactive to light.  Neck: Normal range of motion. Neck supple.  Cardiovascular:  No murmur heard. Pulmonary/Chest: Effort normal and breath sounds normal. No respiratory distress. She has no wheezes. She has no rales. She exhibits no tenderness.  Abdominal: Soft. Bowel sounds are normal. She exhibits no distension and no mass. There is no tenderness. There is no rebound and no guarding.  Musculoskeletal: Normal range of motion. She exhibits tenderness. She exhibits no edema.  Unsteady gait. Lower back across R+L SIJ-better managed with Norco bid  Neurological: She is alert. She displays normal reflexes. No cranial nerve deficit. She exhibits normal muscle tone. Coordination normal.  Forgetful. Short-term memory deficits.  Skin: Skin is warm and dry. No rash noted. No erythema.  Psychiatric: Her behavior is normal. Thought content normal. Her mood appears not anxious. Her affect is not angry, not labile and not inappropriate. Her  speech is not rapid and/or pressured, not delayed and not slurred. She is not agitated, not aggressive, not slowed and not withdrawn. Thought content is not paranoid and not delusional. Cognition and memory are impaired. She does not express impulsivity or inappropriate judgment. Depressed: flat affect. She exhibits abnormal recent memory.    Labs reviewed:  Recent Labs  08/16/15 1128 08/21/15 01/08/16  NA 138 140 141  K 3.8 4.3 4.1  CL 103  --   --   CO2 23  --   --   GLUCOSE 197*  --   --   BUN 18 17 16   CREATININE 0.82 0.5 0.7  CALCIUM 9.4  --   --     Recent Labs  08/21/15  AST 15  ALT 19  ALKPHOS 53    Recent Labs  08/16/15 1128 08/21/15  WBC 6.8 6.4  NEUTROABS 4.1  --   HGB 14.2 13.8  HCT 43.4 41  MCV 98.9  --   PLT 173 172   Lab Results  Component Value Date   TSH 3.31 08/21/2015   Lab Results  Component Value Date   HGBA1C 6.7 01/08/2016   No results found for: CHOL, HDL, LDLCALC, LDLDIRECT, TRIG, CHOLHDL  Significant Diagnostic Results in last 30 days:  Ct Head Wo Contrast  Result Date: 06/26/2016 CLINICAL DATA:  [place] trying to ensure a car. Striking the back of the head on concrete. Soft tissue injury. EXAM: CT HEAD WITHOUT CONTRAST CT CERVICAL SPINE WITHOUT CONTRAST TECHNIQUE: Multidetector CT imaging of the head and cervical spine was performed following the standard protocol without intravenous contrast. Multiplanar CT image reconstructions of the cervical spine were also generated. COMPARISON:  08/16/2015 FINDINGS: CT HEAD FINDINGS Brain: Generalized atrophy. Extensive chronic small-vessel ischemic changes affecting the hemispheric white matter. Old small vessel infarctions in the cerebellum. No evidence of acute infarction, mass lesion, hemorrhage, hydrocephalus or extra-axial collection. Right frontal calcification measuring 1 cm probably represents an old calcified meningioma of no significance. Vascular: There is atherosclerotic calcification of the  major vessels at the base of the brain. Skull: No skull fracture or significant calvarial lesion. Sinuses/Orbits: Clear/normal Other: None significant CT CERVICAL SPINE FINDINGS Alignment: No traumatic finding. Chronic osteoarthritis of the C1-2 articulation. Ordinary cervical spondylosis with mild osteophytic encroachment upon the canal and foramina. No advanced facet arthropathy. Skull base and vertebrae: Negative Soft tissues and spinal canal: None significant Upper chest: Limited visualization.  Mild scarring. Other: None significant IMPRESSION: Head CT: No acute or traumatic finding. Atrophy an extensive chronic small vessel ischemic changes. Cervical spine CT: No acute or traumatic finding. Ordinary mild degenerative changes. Electronically Signed   By: Nelson Chimes M.D.   On: 06/26/2016 19:48   Ct Cervical Spine Wo Contrast  Result Date: 06/26/2016 CLINICAL DATA:  [place] trying to ensure a car. Striking the back  of the head on concrete. Soft tissue injury. EXAM: CT HEAD WITHOUT CONTRAST CT CERVICAL SPINE WITHOUT CONTRAST TECHNIQUE: Multidetector CT imaging of the head and cervical spine was performed following the standard protocol without intravenous contrast. Multiplanar CT image reconstructions of the cervical spine were also generated. COMPARISON:  08/16/2015 FINDINGS: CT HEAD FINDINGS Brain: Generalized atrophy. Extensive chronic small-vessel ischemic changes affecting the hemispheric white matter. Old small vessel infarctions in the cerebellum. No evidence of acute infarction, mass lesion, hemorrhage, hydrocephalus or extra-axial collection. Right frontal calcification measuring 1 cm probably represents an old calcified meningioma of no significance. Vascular: There is atherosclerotic calcification of the major vessels at the base of the brain. Skull: No skull fracture or significant calvarial lesion. Sinuses/Orbits: Clear/normal Other: None significant CT CERVICAL SPINE FINDINGS Alignment: No  traumatic finding. Chronic osteoarthritis of the C1-2 articulation. Ordinary cervical spondylosis with mild osteophytic encroachment upon the canal and foramina. No advanced facet arthropathy. Skull base and vertebrae: Negative Soft tissues and spinal canal: None significant Upper chest: Limited visualization.  Mild scarring. Other: None significant IMPRESSION: Head CT: No acute or traumatic finding. Atrophy an extensive chronic small vessel ischemic changes. Cervical spine CT: No acute or traumatic finding. Ordinary mild degenerative changes. Electronically Signed   By: Nelson Chimes M.D.   On: 06/26/2016 19:48    Assessment/Plan Scalp laceration, sequela Mechanical fall at [facility], ED eval 06/26/16, small laceration occiput, no s/s of infection, sutures to be removed in 2 weeks. Will update labs: CBC CMP TSH  Hypertension Controlled, continue Amlodipine 10mg , Losartan 100mg , Metoprolol 100mg  bid,  Constipation Stable, continue Senokot S II nightly.   GERD (gastroesophageal reflux disease) Stable, continue Famotidine 20mg  daily  Hypothyroidism Continue Levothyroxine 86mcg, TSH 3.31 08/21/15, update TSH  Memory deficit Memory lapses, MMSE, SNF for care assistance.   Fall Close supervision for safety, lack of safety awareness and increased frailty contributory for her falling.   Insomnia Stable, continue Trazodone 25mg  daily. Off prn Ativan 12/28/15     Family/ staff Communication: SNF  Labs/tests ordered:  CBC CMP TSH

## 2016-06-28 NOTE — Assessment & Plan Note (Signed)
Stable, continue Senokot S II nightly.  

## 2016-06-28 NOTE — Assessment & Plan Note (Signed)
Controlled, continue Amlodipine 10mg , Losartan 100mg , Metoprolol 100mg  bid,

## 2016-06-28 NOTE — Assessment & Plan Note (Signed)
Continue Levothyroxine 38mcg, TSH 3.31 08/21/15, update TSH

## 2016-06-28 NOTE — Assessment & Plan Note (Signed)
Mechanical fall at [facility], ED eval 06/26/16, small laceration occiput, no s/s of infection, sutures to be removed in 2 weeks. Will update labs: CBC CMP TSH

## 2016-06-28 NOTE — Assessment & Plan Note (Signed)
Stable, continue Trazodone 25mg daily. Off prn Ativan 12/28/15 

## 2016-06-28 NOTE — Assessment & Plan Note (Signed)
Close supervision for safety, lack of safety awareness and increased frailty contributory for her falling.

## 2016-07-01 DIAGNOSIS — E039 Hypothyroidism, unspecified: Secondary | ICD-10-CM | POA: Diagnosis not present

## 2016-07-01 DIAGNOSIS — I10 Essential (primary) hypertension: Secondary | ICD-10-CM | POA: Diagnosis not present

## 2016-07-01 DIAGNOSIS — M6281 Muscle weakness (generalized): Secondary | ICD-10-CM | POA: Diagnosis not present

## 2016-07-01 LAB — CBC AND DIFFERENTIAL
HCT: 40 % (ref 36–46)
Hemoglobin: 13.3 g/dL (ref 12.0–16.0)
PLATELETS: 194 10*3/uL (ref 150–399)
WBC: 6 10^3/mL

## 2016-07-01 LAB — HEPATIC FUNCTION PANEL
ALT: 16 U/L (ref 7–35)
AST: 15 U/L (ref 13–35)
Alkaline Phosphatase: 39 U/L (ref 25–125)
Bilirubin, Total: 1 mg/dL

## 2016-07-01 LAB — BASIC METABOLIC PANEL WITH GFR
BUN: 14 mg/dL (ref 4–21)
Creatinine: 0.6 mg/dL (ref <=1.1)
Glucose: 150 mg/dL
Potassium: 3.9 mmol/L (ref 3.4–5.3)
Sodium: 140 mmol/L (ref 137–147)

## 2016-07-01 LAB — TSH: TSH: 3.64 u[IU]/mL (ref <=5.90)

## 2016-07-02 ENCOUNTER — Other Ambulatory Visit: Payer: Self-pay | Admitting: *Deleted

## 2016-07-02 DIAGNOSIS — M6281 Muscle weakness (generalized): Secondary | ICD-10-CM | POA: Diagnosis not present

## 2016-07-02 DIAGNOSIS — F0280 Dementia in other diseases classified elsewhere without behavioral disturbance: Secondary | ICD-10-CM | POA: Diagnosis not present

## 2016-07-02 DIAGNOSIS — R41841 Cognitive communication deficit: Secondary | ICD-10-CM | POA: Diagnosis not present

## 2016-07-02 DIAGNOSIS — I10 Essential (primary) hypertension: Secondary | ICD-10-CM | POA: Diagnosis not present

## 2016-07-02 DIAGNOSIS — E039 Hypothyroidism, unspecified: Secondary | ICD-10-CM | POA: Diagnosis not present

## 2016-07-02 DIAGNOSIS — H3530 Unspecified macular degeneration: Secondary | ICD-10-CM | POA: Diagnosis not present

## 2016-07-09 DIAGNOSIS — L570 Actinic keratosis: Secondary | ICD-10-CM | POA: Diagnosis not present

## 2016-07-09 DIAGNOSIS — L218 Other seborrheic dermatitis: Secondary | ICD-10-CM | POA: Diagnosis not present

## 2016-07-09 DIAGNOSIS — L0889 Other specified local infections of the skin and subcutaneous tissue: Secondary | ICD-10-CM | POA: Diagnosis not present

## 2016-07-09 DIAGNOSIS — L814 Other melanin hyperpigmentation: Secondary | ICD-10-CM | POA: Diagnosis not present

## 2016-07-15 ENCOUNTER — Encounter: Payer: Self-pay | Admitting: Internal Medicine

## 2016-07-15 ENCOUNTER — Non-Acute Institutional Stay (SKILLED_NURSING_FACILITY): Payer: Medicare Other | Admitting: Internal Medicine

## 2016-07-15 DIAGNOSIS — R413 Other amnesia: Secondary | ICD-10-CM | POA: Diagnosis not present

## 2016-07-15 DIAGNOSIS — I10 Essential (primary) hypertension: Secondary | ICD-10-CM | POA: Diagnosis not present

## 2016-07-15 DIAGNOSIS — S0101XS Laceration without foreign body of scalp, sequela: Secondary | ICD-10-CM

## 2016-07-15 DIAGNOSIS — E039 Hypothyroidism, unspecified: Secondary | ICD-10-CM | POA: Diagnosis not present

## 2016-07-15 DIAGNOSIS — R739 Hyperglycemia, unspecified: Secondary | ICD-10-CM | POA: Diagnosis not present

## 2016-07-15 DIAGNOSIS — G319 Degenerative disease of nervous system, unspecified: Secondary | ICD-10-CM

## 2016-07-15 DIAGNOSIS — Z9181 History of falling: Secondary | ICD-10-CM | POA: Diagnosis not present

## 2016-07-15 DIAGNOSIS — D329 Benign neoplasm of meninges, unspecified: Secondary | ICD-10-CM | POA: Diagnosis not present

## 2016-07-15 DIAGNOSIS — I679 Cerebrovascular disease, unspecified: Secondary | ICD-10-CM

## 2016-07-15 DIAGNOSIS — N3946 Mixed incontinence: Secondary | ICD-10-CM

## 2016-07-15 NOTE — Progress Notes (Signed)
History and Physical     Location: [facility] Room Number: N2 Place of Service:  SNF (31)   PCP: Jeanmarie Hubert, MD Patient Care Team: Estill Dooms, MD as PCP - General (Internal Medicine) Man Otho Darner, NP as Nurse Practitioner (Nurse Practitioner) Charlotte Crumb, MD as Consulting Physician (Orthopedic Surgery) Wallene Huh, DPM as Consulting Physician (Podiatry) Gaynelle Arabian, MD as Consulting Physician (Orthopedic Surgery) Zebedee Iba, MD as Referring Physician (Ophthalmology) Newman Pies, MD as Consulting Physician (Neurosurgery) [facility] (Skilled Nursing and [place])  Extended Emergency Contact Information Primary Emergency Contact: [place] Phone: [phone] Mobile Phone: [phone] Relation: Daughter Secondary Emergency Contact: Jennet Mad[place] of [place] Phone: [phone] Relation: Grandson  Code Status: DNR Goals of Care: Advanced Directive information Advanced Directives 07/15/2016  Does Patient Have a Medical Advance Directive? Yes  Type of Paramedic of [place];Living will;Out of facility DNR (pink MOST or yellow form)  Does patient want to make changes to medical advance directive? -  Copy of [place] in Chart? Yes  Pre-existing out of facility DNR order (yellow form or pink MOST form) Yellow form placed in chart (order not valid for inpatient use)     Chief Complaint  Patient presents with  . Annual Exam    History and Physical  . Medical Management of Chronic Issues    HPI: Patient is a 83 y.o. female seen today for an annual comprehensive examination. She was transfeered to [facility] on 06/18/16. She had a fall on 06/26/16 and sustained a head contusion and laceration. She is fully healed and back to her usual self.  Chronic issues include dementia, HTN, OA, hypothyrpoidism, Gerd, urine  incontinence. These problems are stable.  I anticipate that this is a long term care admission due to the dementia and risk of falls. She requires supervision in all ADL.  Past Medical History:  Diagnosis Date  . Arthritis   . Cataracts, bilateral   . Closed fracture of unspecified part of lower end of humerus   . Dementia   . Hypertension   . Insomnia, unspecified   . Macular degeneration (senile) of retina, unspecified   . Osteoarthritis   . Osteoarthrosis, unspecified whether generalized or localized, unspecified site   . Pancreatitis, acute 08/14/2013  . Retinal neovascularization NOS   . Senile osteoporosis   . Subarachnoid hemorrhage following injury ([place]) 03/31/2012  . Unspecified hearing loss   . Unspecified hypothyroidism   . Urinary tract infection 08/10/2013  . Vertebral fracture 08/09/2013   T7 10%, T12 70%    Past Surgical History:  Procedure Laterality Date  . ABDOMINAL HYSTERECTOMY  1942  . ANKLE FRACTURE SURGERY  1960  . BLADDER SUSPENSION    . CLOSED REDUCTION WITH HUMERAL PIN INSERTION  04/01/2012   Procedure: CLOSED REDUCTION WITH HUMERAL PIN INSERTION;  Surgeon: Schuyler Amor, MD;  Location: [facility] ORS;  Service: Orthopedics;  Laterality: Left;    reports that she has never smoked. She has never used smokeless tobacco. She reports that she does not drink alcohol or use drugs. Social History   Social History  . Marital status: Widowed    Spouse name: N/A  . Number of children: N/A  . Years of education: N/A   Occupational History  . homemaker    Social History Main Topics  . Smoking status: Never Smoker  . Smokeless tobacco: Never Used  .  Alcohol use No  . Drug use: No  . Sexual activity: No   Other Topics Concern  . None   Social History Narrative   Lives at [facility] since 2013, [place] to [facility] 06/26/16   Widowed   Never smoked   Alcohol none   Exercise none   Walks with walker   DNR, POA,  Living Will                     Family History   Problem Relation Age of Onset  . Hypertension Mother     Pertinent  Health Maintenance Due  Topic Date Due  . DEXA SCAN  12/29/1983  . PNA vac Low Risk Adult (1 of 2 - PCV13) 12/29/1983  . INFLUENZA VACCINE  Completed   Fall Risk  02/07/2015 02/22/2014 02/22/2014  Falls in the past year? No Yes No  Number falls in past yr: - 1 -  Risk for fall due to : - History of fall(s) -   Depression screen [facility] 2/9 02/22/2014 02/22/2014  Decreased Interest 0 -  Down, Depressed, Hopeless 0 0  PHQ - 2 Score 0 0    Functional Status Survey:    No Known Allergies  Allergies as of 07/15/2016   No Known Allergies     Medication List       Accurate as of 07/15/16 12:38 PM. Always use your most recent med list.          acetaminophen 500 MG tablet Commonly known as:  TYLENOL Take 500 mg by mouth every 6 (six) hours as needed. Pain   amLODipine 10 MG tablet Commonly known as:  NORVASC Take 10 mg by mouth daily.   aspirin EC 81 MG tablet Take 81 mg by mouth daily.   famotidine 20 MG tablet Commonly known as:  PEPCID Take 20 mg by mouth at bedtime.   ketoconazole 2 % shampoo Commonly known as:  NIZORAL Apply 1 application topically. Shampoo once a week on Wednesday   levothyroxine 50 MCG tablet Commonly known as:  SYNTHROID, LEVOTHROID Take 50 mcg by mouth daily before breakfast.   losartan 100 MG tablet Commonly known as:  COZAAR Take 100 mg by mouth daily.   metoprolol 100 MG tablet Commonly known as:  LOPRESSOR Take 100 mg by mouth. Take one tablet twice daily   olive oil external oil Apply topically. Instill 1 drop in each ear at bedtime   raloxifene 60 MG tablet Commonly known as:  EVISTA Take 60 mg by mouth daily.   senna-docusate 8.6-50 MG tablet Commonly known as:  Senokot-S Take 2 tablets by mouth at bedtime.   traMADol 50 MG tablet Commonly known as:  ULTRAM Take 1 tablet (50 mg total) by mouth every 6 (six) hours as needed.   traZODone 50 MG  tablet Commonly known as:  DESYREL Take 25 mg by mouth at bedtime.       Review of Systems  Constitutional: Negative for activity change, appetite change, chills, diaphoresis, fatigue, fever and unexpected weight change.  HENT: Positive for hearing loss. Negative for congestion, ear discharge, postnasal drip, rhinorrhea, trouble swallowing and voice change.   Eyes: Negative for pain, discharge, itching and visual disturbance.  Respiratory: Negative for cough, choking, chest tightness, shortness of breath and wheezing.   Cardiovascular: Negative for chest pain, palpitations and leg swelling.  Gastrointestinal: Negative for abdominal pain, constipation, diarrhea, nausea and vomiting.  Endocrine: Negative for cold intolerance, heat intolerance, polydipsia, polyphagia and polyuria.  Compensated hypothyroidismism  Genitourinary: Negative for dysuria, flank pain, frequency and urgency.       Urinary  incontinence  Musculoskeletal: Positive for arthralgias (left arm), back pain (improved) and gait problem. Negative for neck pain and neck stiffness.  Skin: Negative for pallor, rash and wound.       Multiple SK and seborrhea of the scalp.  Allergic/Immunologic: Negative.   Neurological: Negative for tremors, syncope, speech difficulty, weakness, numbness and headaches.       Demented  Hematological: Negative.   Psychiatric/Behavioral: Positive for confusion (occasionally), decreased concentration and sleep disturbance. Negative for agitation, behavioral problems, dysphoric mood and hallucinations. The patient is not nervous/anxious.     Vitals:   07/15/16 1227  BP: 140/80  Pulse: 86  Resp: 18  Temp: 97 F (36.1 C)  SpO2: 98%  Weight: 154 lb (69.9 kg)  Height: 5' 1.5" (1.562 m)   Body mass index is 28.63 kg/m. Physical Exam  Constitutional: She is oriented to person, place, and time. She appears well-developed and well-nourished. No distress.  HENT:  Head: Normocephalic and  atraumatic.  Right Ear: External ear normal.  Left Ear: External ear normal.  Nose: Nose normal.  Mouth/Throat: Oropharynx is clear and moist. No oropharyngeal exudate.  Severe bilateral hearing loss  Eyes: Conjunctivae and EOM are normal. Pupils are equal, round, and reactive to light. No scleral icterus.  Neck: Normal range of motion. Neck supple. No JVD present. No tracheal deviation present. No thyromegaly present.  Cardiovascular: Normal rate, regular rhythm, normal heart sounds and intact distal pulses.  Exam reveals no gallop and no friction rub.   No murmur heard. Pulmonary/Chest: Effort normal and breath sounds normal. No respiratory distress. She has no wheezes. She has no rales. She exhibits no tenderness.  Abdominal: Soft. Bowel sounds are normal. She exhibits no distension and no mass. There is no tenderness. There is no rebound and no guarding.  Musculoskeletal: Normal range of motion. She exhibits tenderness. She exhibits no edema.  Demented Unsteady gait. Lower back across R+L SIJ-better managed with Norco bid  Lymphadenopathy:    She has no cervical adenopathy.  Neurological: She is alert and oriented to person, place, and time. She displays normal reflexes. No cranial nerve deficit. She exhibits normal muscle tone. Coordination normal.  Forgetful. Short-term memory deficits.  Skin: Skin is warm and dry. No rash noted. She is not diaphoretic. No erythema. No pallor.  Psychiatric: Her behavior is normal. Judgment and thought content normal. Her mood appears not anxious. Her affect is not angry, not labile and not inappropriate. Her speech is not rapid and/or pressured, not delayed and not slurred. She is not agitated, not aggressive, not slowed and not withdrawn. Thought content is not paranoid and not delusional. Cognition and memory are impaired. She does not express impulsivity or inappropriate judgment. Depressed: flat affect. She exhibits abnormal recent memory.    Labs  reviewed: Basic Metabolic Panel:  Recent Labs  08/16/15 1128 08/21/15 01/08/16 07/01/16  NA 138 140 141 140  K 3.8 4.3 4.1 3.9  CL 103  --   --   --   CO2 23  --   --   --   GLUCOSE 197*  --   --   --   BUN 18 17 16 14   CREATININE 0.82 0.5 0.7 0.6  CALCIUM 9.4  --   --   --    Liver Function Tests:  Recent Labs  08/21/15 07/01/16  AST 15 15  ALT  19 16  ALKPHOS 53 39   No results for input(s): LIPASE, AMYLASE in the last 8760 hours. No results for input(s): AMMONIA in the last 8760 hours. CBC:  Recent Labs  08/16/15 1128 08/21/15 07/01/16  WBC 6.8 6.4 6.0  NEUTROABS 4.1  --   --   HGB 14.2 13.8 13.3  HCT 43.4 41 40  MCV 98.9  --   --   PLT 173 172 194   Cardiac Enzymes: No results for input(s): CKTOTAL, CKMB, CKMBINDEX, TROPONINI in the last 8760 hours. BNP: Invalid input(s): POCBNP Lab Results  Component Value Date   HGBA1C 6.7 01/08/2016   Lab Results  Component Value Date   TSH 3.64 07/01/2016   No results found for: VITAMINB12 No results found for: FOLATE No results found for: IRON, TIBC, FERRITIN  Imaging and Procedures obtained recently: Ct Head Wo Contrast  Result Date: 06/26/2016 CLINICAL DATA:  [place] trying to ensure a car. Striking the back of the head on concrete. Soft tissue injury. EXAM: CT HEAD WITHOUT CONTRAST CT CERVICAL SPINE WITHOUT CONTRAST TECHNIQUE: Multidetector CT imaging of the head and cervical spine was performed following the standard protocol without intravenous contrast. Multiplanar CT image reconstructions of the cervical spine were also generated. COMPARISON:  08/16/2015 FINDINGS: CT HEAD FINDINGS Brain: Generalized atrophy. Extensive chronic small-vessel ischemic changes affecting the hemispheric white matter. Old small vessel infarctions in the cerebellum. No evidence of acute infarction, mass lesion, hemorrhage, hydrocephalus or extra-axial collection. Right frontal calcification measuring 1 cm probably represents an old  calcified meningioma of no significance. Vascular: There is atherosclerotic calcification of the major vessels at the base of the brain. Skull: No skull fracture or significant calvarial lesion. Sinuses/Orbits: Clear/normal Other: None significant CT CERVICAL SPINE FINDINGS Alignment: No traumatic finding. Chronic osteoarthritis of the C1-2 articulation. Ordinary cervical spondylosis with mild osteophytic encroachment upon the canal and foramina. No advanced facet arthropathy. Skull base and vertebrae: Negative Soft tissues and spinal canal: None significant Upper chest: Limited visualization.  Mild scarring. Other: None significant IMPRESSION: Head CT: No acute or traumatic finding. Atrophy an extensive chronic small vessel ischemic changes. Cervical spine CT: No acute or traumatic finding. Ordinary mild degenerative changes. Electronically Signed   By: Nelson Chimes M.D.   On: 06/26/2016 19:48   Ct Cervical Spine Wo Contrast  Result Date: 06/26/2016 CLINICAL DATA:  [place] trying to ensure a car. Striking the back of the head on concrete. Soft tissue injury. EXAM: CT HEAD WITHOUT CONTRAST CT CERVICAL SPINE WITHOUT CONTRAST TECHNIQUE: Multidetector CT imaging of the head and cervical spine was performed following the standard protocol without intravenous contrast. Multiplanar CT image reconstructions of the cervical spine were also generated. COMPARISON:  08/16/2015 FINDINGS: CT HEAD FINDINGS Brain: Generalized atrophy. Extensive chronic small-vessel ischemic changes affecting the hemispheric white matter. Old small vessel infarctions in the cerebellum. No evidence of acute infarction, mass lesion, hemorrhage, hydrocephalus or extra-axial collection. Right frontal calcification measuring 1 cm probably represents an old calcified meningioma of no significance. Vascular: There is atherosclerotic calcification of the major vessels at the base of the brain. Skull: No skull fracture or significant calvarial lesion.  Sinuses/Orbits: Clear/normal Other: None significant CT CERVICAL SPINE FINDINGS Alignment: No traumatic finding. Chronic osteoarthritis of the C1-2 articulation. Ordinary cervical spondylosis with mild osteophytic encroachment upon the canal and foramina. No advanced facet arthropathy. Skull base and vertebrae: Negative Soft tissues and spinal canal: None significant Upper chest: Limited visualization.  Mild scarring. Other: None significant IMPRESSION: Head CT:  No acute or traumatic finding. Atrophy an extensive chronic small vessel ischemic changes. Cervical spine CT: No acute or traumatic finding. Ordinary mild degenerative changes. Electronically Signed   By: Nelson Chimes M.D.   On: 06/26/2016 19:48    Assessment/Plan  1. Memory deficit Gradually getting worse. Probably related to cerebrovascular dsease, but cannot rule out component of Alzheimer's  2. Essential hypertension controlled  3. Hypothyroidism, unspecified type compensated  4. Hyperglycemia Recheck glucose -A1c, BMP  5. Scalp laceration, sequela Fully healed  6. Mixed stress and urge urinary incontinence observe  7. History of fall Continues at risk for falls due to gait instbility.  8. Cerebrovascular disease Noted on CT brain 06/26/16  9. Cerebral atrophy Noted on CT brain 2/14 18  10. Meningioma ([place]) Calcified and incidentally noted on CT brain 06/26/16.

## 2016-07-18 DIAGNOSIS — I10 Essential (primary) hypertension: Secondary | ICD-10-CM | POA: Diagnosis not present

## 2016-07-18 DIAGNOSIS — E785 Hyperlipidemia, unspecified: Secondary | ICD-10-CM | POA: Diagnosis not present

## 2016-07-18 LAB — HEMOGLOBIN A1C: HEMOGLOBIN A1C: 6.7

## 2016-07-18 LAB — BASIC METABOLIC PANEL
BUN: 17 mg/dL (ref 4–21)
Creatinine: 0.6 mg/dL (ref <=1.1)
Glucose: 167 mg/dL
Potassium: 4 mmol/L (ref 3.4–5.3)
SODIUM: 139 mmol/L (ref 137–147)

## 2016-07-19 ENCOUNTER — Other Ambulatory Visit: Payer: Self-pay | Admitting: *Deleted

## 2016-08-06 DIAGNOSIS — L821 Other seborrheic keratosis: Secondary | ICD-10-CM | POA: Diagnosis not present

## 2016-08-06 DIAGNOSIS — L0889 Other specified local infections of the skin and subcutaneous tissue: Secondary | ICD-10-CM | POA: Diagnosis not present

## 2016-08-06 DIAGNOSIS — L570 Actinic keratosis: Secondary | ICD-10-CM | POA: Diagnosis not present

## 2016-08-06 DIAGNOSIS — L814 Other melanin hyperpigmentation: Secondary | ICD-10-CM | POA: Diagnosis not present

## 2016-08-13 ENCOUNTER — Encounter: Payer: Self-pay | Admitting: Nurse Practitioner

## 2016-08-13 ENCOUNTER — Non-Acute Institutional Stay (SKILLED_NURSING_FACILITY): Payer: Medicare Other | Admitting: Nurse Practitioner

## 2016-08-13 DIAGNOSIS — E039 Hypothyroidism, unspecified: Secondary | ICD-10-CM

## 2016-08-13 DIAGNOSIS — F5101 Primary insomnia: Secondary | ICD-10-CM | POA: Diagnosis not present

## 2016-08-13 DIAGNOSIS — R413 Other amnesia: Secondary | ICD-10-CM | POA: Diagnosis not present

## 2016-08-13 DIAGNOSIS — K5900 Constipation, unspecified: Secondary | ICD-10-CM

## 2016-08-13 DIAGNOSIS — M159 Polyosteoarthritis, unspecified: Secondary | ICD-10-CM

## 2016-08-13 DIAGNOSIS — K219 Gastro-esophageal reflux disease without esophagitis: Secondary | ICD-10-CM | POA: Diagnosis not present

## 2016-08-13 DIAGNOSIS — M150 Primary generalized (osteo)arthritis: Secondary | ICD-10-CM | POA: Diagnosis not present

## 2016-08-13 DIAGNOSIS — I10 Essential (primary) hypertension: Secondary | ICD-10-CM | POA: Diagnosis not present

## 2016-08-13 NOTE — Assessment & Plan Note (Signed)
Stable, continue Senokot S II nightly.

## 2016-08-13 NOTE — Assessment & Plan Note (Signed)
Stable, continue Trazodone 25mg  daily. Off prn Ativan 12/28/15

## 2016-08-13 NOTE — Assessment & Plan Note (Signed)
07/19/14 X-ray lumbar spine and pelvis: T11 subacute process with more than 70% height loss Pain is reasonably controlled. Continue prn Tylenol and Tramadol

## 2016-08-13 NOTE — Progress Notes (Signed)
[place] Room Number: 2 Place of Service:  SNF (31) Provider:  Kota Ciancio, Manxie  NP   Jeanmarie Hubert, MD  Patient Care Team: Estill Dooms, MD as PCP - General (Internal Medicine) Caytlyn Evers Otho Darner, NP as Nurse Practitioner (Nurse Practitioner) Charlotte Crumb, MD as Consulting Physician (Orthopedic Surgery) Wallene Huh, DPM as Consulting Physician (Podiatry) Gaynelle Arabian, MD as Consulting Physician (Orthopedic Surgery) Zebedee Iba, MD as Referring Physician (Ophthalmology) Newman Pies, MD as Consulting Physician (Neurosurgery) [facility] (Skilled Nursing and [place])  Extended Emergency Contact Information Primary Emergency Contact: [place] of [place] Phone: [phone] Mobile Phone: [phone] Relation: Daughter Secondary Emergency Contact: [place] of [place] Phone: [phone] Relation: Grandson  Code Status:  DNR Goals of care: Advanced Directive information Advanced Directives 08/13/2016  Does Patient Have a Medical Advance Directive? Yes  Type of Paramedic of [place];Living will;Out of facility DNR (pink MOST or yellow form)  Does patient want to make changes to medical advance directive? No - Patient declined  Copy of [place] in Chart? Yes  Pre-existing out of facility DNR order (yellow form or pink MOST form) Yellow form placed in chart (order not valid for inpatient use)     Chief Complaint  Patient presents with  . Medical Management of Chronic Issues    HPI:  Pt is a 83 y.o. female seen today for medical management of chronic diseases.      Hx of GERD, stable while on Famotidine 20mg  daily, memory lapses, insomnia, sleeps with aid of Trazodone 25mg , back pain, chronic, ambulates with walker, prn Tramadol 50mg  q6h available to her, thyroid, taking Levothyroxine 36mcg, last TSH 3.64 07/01/16, HTN,  controlled on Amlodipine 10mg , Losartan 100mg , Metoprolol 100mg  bid. Not constipation, taking Senna II qhs.   Past Medical History:  Diagnosis Date  . Arthritis   . Cataracts, bilateral   . Closed fracture of unspecified part of lower end of humerus   . Dementia   . Hypertension   . Insomnia, unspecified   . Macular degeneration (senile) of retina, unspecified   . Osteoarthritis   . Osteoarthrosis, unspecified whether generalized or localized, unspecified site   . Pancreatitis, acute 08/14/2013  . Retinal neovascularization NOS   . Senile osteoporosis   . Subarachnoid hemorrhage following injury ([place]) 03/31/2012  . Unspecified hearing loss   . Unspecified hypothyroidism   . Urinary tract infection 08/10/2013  . Vertebral fracture 08/09/2013   T7 10%, T12 70%    Past Surgical History:  Procedure Laterality Date  . ABDOMINAL HYSTERECTOMY  1942  . ANKLE FRACTURE SURGERY  1960  . BLADDER SUSPENSION    . CLOSED REDUCTION WITH HUMERAL PIN INSERTION  04/01/2012   Procedure: CLOSED REDUCTION WITH HUMERAL PIN INSERTION;  Surgeon: Schuyler Amor, MD;  Location: [facility];  Service: Orthopedics;  Laterality: Left;    No Known Allergies  Allergies as of 08/13/2016   No Known Allergies     Medication List       Accurate as of 08/13/16 12:30 PM. Always use your most recent med list.          acetaminophen 500 MG tablet Commonly known as:  TYLENOL Take 500 mg by mouth every 6 (six) hours as needed. Pain   amLODipine 10 MG tablet Commonly known as:  NORVASC Take 10 mg by mouth daily.   aspirin EC 81 MG tablet Take 81 mg  by mouth daily.   famotidine 20 MG tablet Commonly known as:  PEPCID Take 20 mg by mouth at bedtime.   ketoconazole 2 % shampoo Commonly known as:  NIZORAL Apply 1 application topically. Shampoo once a week on Wednesday   levothyroxine 50 MCG tablet Commonly known as:  SYNTHROID, LEVOTHROID Take 50 mcg by mouth daily before breakfast.   losartan 100 MG  tablet Commonly known as:  COZAAR Take 100 mg by mouth daily.   metoprolol 100 MG tablet Commonly known as:  LOPRESSOR Take 100 mg by mouth. Take one tablet twice daily   olive oil external oil Apply topically. Instill 1 drop in each ear at bedtime   raloxifene 60 MG tablet Commonly known as:  EVISTA Take 60 mg by mouth daily.   senna-docusate 8.6-50 MG tablet Commonly known as:  Senokot-S Take 2 tablets by mouth at bedtime.   traMADol 50 MG tablet Commonly known as:  ULTRAM Take 1 tablet (50 mg total) by mouth every 6 (six) hours as needed.   traZODone 50 MG tablet Commonly known as:  DESYREL Take 25 mg by mouth at bedtime.       Review of Systems  Constitutional: Negative for activity change, appetite change, chills, diaphoresis, fatigue, fever and unexpected weight change.  HENT: Positive for hearing loss. Negative for congestion, ear discharge, postnasal drip, rhinorrhea, trouble swallowing and voice change.   Eyes: Negative for pain, discharge, itching and visual disturbance.  Respiratory: Negative for cough, choking, chest tightness, shortness of breath and wheezing.   Cardiovascular: Negative for chest pain, palpitations and leg swelling.  Gastrointestinal: Negative for abdominal pain, constipation, diarrhea, nausea and vomiting.  Endocrine: Negative for cold intolerance, heat intolerance, polydipsia, polyphagia and polyuria.       Compensated hypothyroidismism  Genitourinary: Negative for dysuria, flank pain, frequency and urgency.       Urinary  incontinence  Musculoskeletal: Positive for arthralgias (left arm), back pain (improved) and gait problem. Negative for neck pain and neck stiffness.  Skin: Negative for pallor, rash and wound.       Multiple SK and seborrhea of the scalp.  Allergic/Immunologic: Negative.   Neurological: Negative for tremors, syncope, speech difficulty, weakness, numbness and headaches.       Demented  Hematological: Negative.     Psychiatric/Behavioral: Positive for confusion (occasionally), decreased concentration and sleep disturbance. Negative for agitation, behavioral problems, dysphoric mood and hallucinations. The patient is not nervous/anxious.     Immunization History  Administered Date(s) Administered  . Influenza-Unspecified 02/15/2014, 02/16/2015, 02/22/2016  . PPD Test 03/23/2012   Pertinent  Health Maintenance Due  Topic Date Due  . DEXA SCAN  12/29/1983  . PNA vac Low Risk Adult (1 of 2 - PCV13) 12/29/1983  . INFLUENZA VACCINE  12/11/2016   Fall Risk  02/07/2015 02/22/2014 02/22/2014  Falls in the past year? No Yes No  Number falls in past yr: - 1 -  Risk for fall due to : - History of fall(s) -   Functional Status Survey:    Vitals:   08/13/16 1108  BP: 122/77  Pulse: 92  Resp: 18  Temp: 97.2 F (36.2 C)  SpO2: 96%  Weight: 155 lb (70.3 kg)   Body mass index is 28.81 kg/m. Physical Exam  Constitutional: She is oriented to person, place, and time. She appears well-developed and well-nourished. No distress.  HENT:  Head: Normocephalic and atraumatic.  Right Ear: External ear normal.  Left Ear: External ear normal.  Nose: Nose  normal.  Mouth/Throat: Oropharynx is clear and moist. No oropharyngeal exudate.  Severe bilateral hearing loss  Eyes: Conjunctivae and EOM are normal. Pupils are equal, round, and reactive to light. No scleral icterus.  Neck: Normal range of motion. Neck supple. No JVD present. No tracheal deviation present. No thyromegaly present.  Cardiovascular: Normal rate, regular rhythm, normal heart sounds and intact distal pulses.  Exam reveals no gallop and no friction rub.   No murmur heard. Pulmonary/Chest: Effort normal and breath sounds normal. No respiratory distress. She has no wheezes. She has no rales. She exhibits no tenderness.  Abdominal: Soft. Bowel sounds are normal. She exhibits no distension and no mass. There is no tenderness. There is no rebound and  no guarding.  Musculoskeletal: Normal range of motion. She exhibits tenderness. She exhibits no edema.  Demented Unsteady gait. Lower back across R+L SIJ-better managed with Norco bid  Lymphadenopathy:    She has no cervical adenopathy.  Neurological: She is alert and oriented to person, place, and time. She displays normal reflexes. No cranial nerve deficit. She exhibits normal muscle tone. Coordination normal.  Forgetful. Short-term memory deficits.  Skin: Skin is warm and dry. No rash noted. She is not diaphoretic. No erythema. No pallor.  Psychiatric: Her behavior is normal. Judgment and thought content normal. Her mood appears not anxious. Her affect is not angry, not labile and not inappropriate. Her speech is not rapid and/or pressured, not delayed and not slurred. She is not agitated, not aggressive, not slowed and not withdrawn. Thought content is not paranoid and not delusional. Cognition and memory are impaired. She does not express impulsivity or inappropriate judgment. Depressed: flat affect. She exhibits abnormal recent memory.    Labs reviewed:  Recent Labs  08/16/15 1128  01/08/16 07/01/16 07/18/16  NA 138  < > 141 140 139  K 3.8  < > 4.1 3.9 4.0  CL 103  --   --   --   --   CO2 23  --   --   --   --   GLUCOSE 197*  --   --   --   --   BUN 18  < > 16 14 17   CREATININE 0.82  < > 0.7 0.6 0.6  CALCIUM 9.4  --   --   --   --   < > = values in this interval not displayed.  Recent Labs  08/21/15 07/01/16  AST 15 15  ALT 19 16  ALKPHOS 53 39    Recent Labs  08/16/15 1128 08/21/15 07/01/16  WBC 6.8 6.4 6.0  NEUTROABS 4.1  --   --   HGB 14.2 13.8 13.3  HCT 43.4 41 40  MCV 98.9  --   --   PLT 173 172 194   Lab Results  Component Value Date   TSH 3.64 07/01/2016   Lab Results  Component Value Date   HGBA1C 6.7 07/18/2016   No results found for: CHOL, HDL, LDLCALC, LDLDIRECT, TRIG, CHOLHDL  Significant Diagnostic Results in last 30 days:  No results  found.  Assessment/Plan Hypertension Controlled, continue Amlodipine 10mg , Losartan 100mg , Metoprolol 100mg  bid, 07/19/16 Na 139, K 4.0, Bun 17, creat 0.63, Hgb a1c 6.7  Constipation Stable, continue Senokot S II nightly.   GERD (gastroesophageal reflux disease) Stable, continue Famotidine 20mg  daily  Hypothyroidism Continue Levothyroxine 3mcg, TSH 3.64 07/01/16  Osteoarthritis 07/19/14 X-ray lumbar spine and pelvis: T11 subacute process with more than 70% height loss Pain is reasonably  controlled. Continue prn Tylenol and Tramadol    Memory deficit Memory lapses, MMSE, SNF for care assistance.   Insomnia Stable, continue Trazodone 25mg  daily. Off prn Ativan 12/28/15     Family/ staff Communication: SNF  Labs/tests ordered:  None

## 2016-08-13 NOTE — Assessment & Plan Note (Signed)
Continue Levothyroxine 42mcg, TSH 3.64 07/01/16

## 2016-08-13 NOTE — Assessment & Plan Note (Signed)
Stable, continue Famotidine 20mg daily.   

## 2016-08-13 NOTE — Assessment & Plan Note (Addendum)
Controlled, continue Amlodipine 10mg , Losartan 100mg , Metoprolol 100mg  bid, 07/19/16 Na 139, K 4.0, Bun 17, creat 0.63, Hgb a1c 6.7

## 2016-08-13 NOTE — Assessment & Plan Note (Signed)
Memory lapses, MMSE, SNF for care assistance.

## 2016-09-10 ENCOUNTER — Non-Acute Institutional Stay (SKILLED_NURSING_FACILITY): Payer: Medicare Other | Admitting: Nurse Practitioner

## 2016-09-10 ENCOUNTER — Encounter: Payer: Self-pay | Admitting: Nurse Practitioner

## 2016-09-10 DIAGNOSIS — R413 Other amnesia: Secondary | ICD-10-CM

## 2016-09-10 DIAGNOSIS — E039 Hypothyroidism, unspecified: Secondary | ICD-10-CM | POA: Diagnosis not present

## 2016-09-10 DIAGNOSIS — F5101 Primary insomnia: Secondary | ICD-10-CM

## 2016-09-10 DIAGNOSIS — I10 Essential (primary) hypertension: Secondary | ICD-10-CM | POA: Diagnosis not present

## 2016-09-10 DIAGNOSIS — M150 Primary generalized (osteo)arthritis: Secondary | ICD-10-CM

## 2016-09-10 DIAGNOSIS — M5440 Lumbago with sciatica, unspecified side: Secondary | ICD-10-CM | POA: Diagnosis not present

## 2016-09-10 DIAGNOSIS — K5900 Constipation, unspecified: Secondary | ICD-10-CM | POA: Diagnosis not present

## 2016-09-10 DIAGNOSIS — M159 Polyosteoarthritis, unspecified: Secondary | ICD-10-CM

## 2016-09-10 DIAGNOSIS — K219 Gastro-esophageal reflux disease without esophagitis: Secondary | ICD-10-CM

## 2016-09-10 NOTE — Assessment & Plan Note (Signed)
Continue Levothyroxine 110mcg, TSH 3.64 07/01/16

## 2016-09-10 NOTE — Assessment & Plan Note (Signed)
Stable, continue Senokot S II nightly.

## 2016-09-10 NOTE — Assessment & Plan Note (Signed)
Pain is reasonably controlled. Continue prn Tylenol

## 2016-09-10 NOTE — Assessment & Plan Note (Signed)
Stable, continue Famotidine 20mg daily.   

## 2016-09-10 NOTE — Assessment & Plan Note (Signed)
Memory lapses, MMSE, SNF for care assistance.

## 2016-09-10 NOTE — Assessment & Plan Note (Signed)
Stable, continue Trazodone 25mg  daily. Off prn Ativan 12/28/15

## 2016-09-10 NOTE — Progress Notes (Addendum)
Location:  [place] Room Number: 2 Place of Service:  SNF (31) Provider:  Mast, Manxie  NP  Estill Dooms, MD  Patient Care Team: Estill Dooms, MD as PCP - General (Internal Medicine) Mast, Man X, NP as Nurse Practitioner (Nurse Practitioner) Charlotte Crumb, MD as Consulting Physician (Orthopedic Surgery) Regal, Tamala Fothergill, DPM as Consulting Physician (Podiatry) Gaynelle Arabian, MD as Consulting Physician (Orthopedic Surgery) Zebedee Iba., MD as Referring Physician (Ophthalmology) Newman Pies, MD as Consulting Physician (Neurosurgery) Melina Modena, [facility] (Skilled Nursing and [place])  Extended Emergency Contact Information Primary Emergency Contact: [place] of [place] Phone: [phone] Mobile Phone: [phone] Relation: Daughter Secondary Emergency Contact: [place] of [place] Phone: [phone] Relation: Grandson  Code Status:  DNR Goals of care: Advanced Directive information Advanced Directives 09/20/2016  Does Patient Have a Medical Advance Directive? Yes  Type of Advance Directive Out of facility DNR (pink MOST or yellow form);[place];Living will  Does patient want to make changes to medical advance directive? No - Patient declined  Copy of [place] in Chart? Yes  Pre-existing out of facility DNR order (yellow form or pink MOST form) Yellow form placed in chart (order not valid for inpatient use)     Chief Complaint  Patient presents with  . Medical Management of Chronic Issues    HPI:  Pt is a 83 y.o. female seen today for medical management of chronic diseases.     Hx of GERD, stable while on Famotidine 20mg  daily, memory lapses, resides in SNF for care assistance,  insomnia, sleeps with aid of Trazodone 25mg , back pain, chronic, ambulates with walker, prn Tylenol available to her, thyroid, taking Levothyroxine  30mcg, last TSH 3.64 07/01/16, HTN, controlled on Amlodipine 10mg , Losartan 100mg , Metoprolol 100mg  bid. Not constipation, taking Senokot S II qhs.              Past Medical History:  Diagnosis Date  . Arthritis   . Cataracts, bilateral   . Closed fracture of unspecified part of lower end of humerus   . Dementia   . Hypertension   . Insomnia, unspecified   . Macular degeneration (senile) of retina, unspecified   . Osteoarthritis   . Osteoarthrosis, unspecified whether generalized or localized, unspecified site   . Pancreatitis, acute 08/14/2013  . Retinal neovascularization NOS   . Senile osteoporosis   . Subarachnoid hemorrhage following injury ([place]) 03/31/2012  . Unspecified hearing loss   . Unspecified hypothyroidism   . Urinary tract infection 08/10/2013  . Vertebral fracture 08/09/2013   T7 10%, T12 70%    Past Surgical History:  Procedure Laterality Date  . ABDOMINAL HYSTERECTOMY  1942  . ANKLE FRACTURE SURGERY  1960  . BLADDER SUSPENSION    . CLOSED REDUCTION WITH HUMERAL PIN INSERTION  04/01/2012   Procedure: CLOSED REDUCTION WITH HUMERAL PIN INSERTION;  Surgeon: Schuyler Amor, MD;  Location: [facility];  Service: Orthopedics;  Laterality: Left;    No Known Allergies  Outpatient Encounter Prescriptions as of 09/10/2016  Medication Sig  . acetaminophen (TYLENOL) 500 MG tablet Take 500 mg by mouth every 6 (six) hours as needed. Pain  . amLODipine (NORVASC) 10 MG tablet Take 10 mg by mouth daily.  Marland Kitchen aspirin EC 81 MG tablet Take 81 mg by mouth daily.  . famotidine (PEPCID) 20 MG tablet Take 20 mg by mouth daily as needed for heartburn.   Marland Kitchen  ketoconazole (NIZORAL) 2 % shampoo Apply 1 application topically. Shampoo once a week on Wednesday  . levothyroxine (SYNTHROID, LEVOTHROID) 50 MCG tablet Take 50 mcg by mouth daily before breakfast.  . losartan (COZAAR) 100 MG tablet Take 100 mg by mouth daily.  . metoprolol (LOPRESSOR) 100 MG tablet Take 100 mg by mouth. Take one tablet  twice daily  . olive oil external oil Apply topically. Instill 1 drop in each ear at bedtime  . raloxifene (EVISTA) 60 MG tablet Take 60 mg by mouth daily.  Marland Kitchen senna-docusate (SENOKOT-S) 8.6-50 MG per tablet Take 2 tablets by mouth at bedtime.  . traZODone (DESYREL) 50 MG tablet Take 25 mg by mouth at bedtime.   . [DISCONTINUED] traMADol (ULTRAM) 50 MG tablet Take 1 tablet (50 mg total) by mouth every 6 (six) hours as needed.   No facility-administered encounter medications on file as of 09/10/2016.     Review of Systems  Constitutional: Negative for activity change, appetite change, chills, diaphoresis, fatigue, fever and unexpected weight change.  HENT: Positive for hearing loss. Negative for congestion, ear discharge, postnasal drip, rhinorrhea, trouble swallowing and voice change.   Eyes: Negative for pain, discharge, itching and visual disturbance.  Respiratory: Negative for cough, choking, chest tightness, shortness of breath and wheezing.   Cardiovascular: Negative for chest pain, palpitations and leg swelling.  Gastrointestinal: Negative for abdominal pain, constipation, diarrhea, nausea and vomiting.  Endocrine: Negative for cold intolerance, heat intolerance, polydipsia, polyphagia and polyuria.       Compensated hypothyroidismism  Genitourinary: Negative for dysuria, flank pain, frequency and urgency.       Urinary  incontinence  Musculoskeletal: Positive for arthralgias (left arm), back pain (improved) and gait problem. Negative for neck pain and neck stiffness.  Skin: Negative for pallor, rash and wound.       Multiple SK and seborrhea of the scalp.  Allergic/Immunologic: Negative.   Neurological: Negative for tremors, syncope, speech difficulty, weakness, numbness and headaches.       Demented  Hematological: Negative.   Psychiatric/Behavioral: Positive for confusion (occasionally), decreased concentration and sleep disturbance. Negative for agitation, behavioral problems,  dysphoric mood and hallucinations. The patient is not nervous/anxious.     Immunization History  Administered Date(s) Administered  . Influenza-Unspecified 02/15/2014, 02/16/2015, 02/22/2016  . PPD Test 03/23/2012   Pertinent  Health Maintenance Due  Topic Date Due  . DEXA SCAN  12/29/1983  . PNA vac Low Risk Adult (1 of 2 - PCV13) 12/29/1983  . INFLUENZA VACCINE  12/11/2016   Fall Risk  02/07/2015 02/22/2014 02/22/2014  Falls in the past year? No Yes No  Number falls in past yr: - 1 -  Risk for fall due to : - History of fall(s) -   Functional Status Survey:    Vitals:   09/10/16 1016  BP: 115/61  Pulse: 68  Resp: 20  Temp: 97.5 F (36.4 C)  SpO2: 95%  Weight: 155 lb (70.3 kg)  Height: 5' 1.5" (1.562 m)   Body mass index is 28.81 kg/m. Physical Exam  Constitutional: She appears well-developed and well-nourished. No distress.  HENT:  Head: Normocephalic and atraumatic.  Right Ear: External ear normal.  Left Ear: External ear normal.  Nose: Nose normal.  Mouth/Throat: Oropharynx is clear and moist. No oropharyngeal exudate.  Severe bilateral hearing loss  Eyes: Conjunctivae and EOM are normal. Pupils are equal, round, and reactive to light. No scleral icterus.  Neck: Normal range of motion. Neck supple. No JVD present. No  tracheal deviation present. No thyromegaly present.  Cardiovascular: Normal rate, regular rhythm, normal heart sounds and intact distal pulses.  Exam reveals no gallop and no friction rub.   No murmur heard. Pulmonary/Chest: Effort normal and breath sounds normal. No respiratory distress. She has no wheezes. She has no rales. She exhibits no tenderness.  Abdominal: Soft. Bowel sounds are normal. She exhibits no distension and no mass. There is no tenderness. There is no rebound and no guarding.  Musculoskeletal: Normal range of motion. She exhibits tenderness. She exhibits no edema.  Demented Unsteady gait. Lower back across R+L SIJ-better managed  with Norco bid  Lymphadenopathy:    She has no cervical adenopathy.  Neurological: She is alert. She displays normal reflexes. No cranial nerve deficit. She exhibits normal muscle tone. Gait abnormal. Coordination normal.  Forgetful. Short-term memory deficits.  Skin: Skin is warm and dry. No rash noted. She is not diaphoretic. No erythema. No pallor.  Psychiatric: Her behavior is normal. Thought content normal. Her mood appears not anxious. Her affect is not angry, not labile and not inappropriate. Her speech is not rapid and/or pressured, not delayed and not slurred. She is not agitated, not aggressive, not slowed and not withdrawn. Thought content is not paranoid and not delusional. Cognition and memory are impaired. She expresses impulsivity and inappropriate judgment. Depressed: flat affect. She exhibits abnormal recent memory.    Labs reviewed:  Recent Labs  01/08/16 07/01/16 07/18/16  NA 141 140 139  K 4.1 3.9 4.0  BUN 16 14 17   CREATININE 0.7 0.6 0.6    Recent Labs  07/01/16  AST 15  ALT 16  ALKPHOS 39    Recent Labs  07/01/16  WBC 6.0  HGB 13.3  HCT 40  PLT 194   Lab Results  Component Value Date   TSH 3.64 07/01/2016   Lab Results  Component Value Date   HGBA1C 6.7 07/18/2016   No results found for: CHOL, HDL, LDLCALC, LDLDIRECT, TRIG, CHOLHDL  Significant Diagnostic Results in last 30 days:  Dg Tibia/fibula Left  Result Date: 09/19/2016 CLINICAL DATA:  Unwitnessed fall, ecchymosis in the left lower extremity EXAM: LEFT TIBIA AND FIBULA - 2 VIEW COMPARISON:  None. FINDINGS: No fracture. No suspicious focal osseous lesion. No radiopaque foreign body. Small enthesophyte at the superior left patella. Degenerative changes at the left ankle joint. Small Achilles left calcaneal spur. Degenerative changes in the dorsal left tarsal joints. IMPRESSION: No fracture. Electronically Signed   By: Ilona Sorrel M.D.   On: 09/19/2016 09:34   Dg Tibia/fibula Right  Result  Date: 09/19/2016 CLINICAL DATA:  Fall EXAM: RIGHT TIBIA AND FIBULA - 2 VIEW COMPARISON:  04/22/2011 FINDINGS: Advanced degenerative changes in the right knee. No acute bony abnormality. Specifically, no fracture, subluxation, or dislocation. Soft tissues are intact. IMPRESSION: No acute bony abnormality. Electronically Signed   By: Rolm Baptise M.D.   On: 09/19/2016 09:35   Ct Head Wo Contrast  Result Date: 09/19/2016 CLINICAL DATA:  Fall.  Forehead laceration and hematoma EXAM: CT HEAD WITHOUT CONTRAST TECHNIQUE: Contiguous axial images were obtained from the base of the skull through the vertex without intravenous contrast. COMPARISON:  06/26/2016 FINDINGS: Brain: There is atrophy and chronic small vessel disease changes. Bold left cerebellar infarct. No acute intracranial abnormality. Specifically, no hemorrhage, hydrocephalus, mass lesion, acute infarction, or significant intracranial injury. Vascular: No hyperdense vessel or unexpected calcification. Skull: No acute calvarial abnormality. Sinuses/Orbits: Visualized paranasal sinuses and mastoids clear. Orbital soft tissues unremarkable.  Other: Forehead soft tissue swelling. IMPRESSION: No acute intracranial abnormality. Atrophy, chronic microvascular disease. Electronically Signed   By: Rolm Baptise M.D.   On: 09/19/2016 09:42    Assessment/Plan Hypertension Controlled, continue Amlodipine 10mg , Losartan 100mg , Metoprolol 100mg  bid  Constipation Stable, continue Senokot S II nightly.   GERD (gastroesophageal reflux disease) Stable, continue Famotidine 20mg  daily  Hypothyroidism Continue Levothyroxine 54mcg, TSH 3.64 07/01/16  Osteoarthritis Pain is reasonably controlled. Continue prn Tylenol  Lower back pain Pain is reasonably controlled. Continue prn Tylenol  Insomnia Stable, continue Trazodone 25mg  daily. Off prn Ativan 12/28/15  Memory deficit Memory lapses, MMSE, SNF for care assistance.     Family/ staff Communication:  SNF  Labs/tests ordered:  none

## 2016-09-10 NOTE — Assessment & Plan Note (Signed)
Controlled, continue Amlodipine 10mg , Losartan 100mg , Metoprolol 100mg  bid

## 2016-09-19 ENCOUNTER — Emergency Department (HOSPITAL_COMMUNITY): Payer: Medicare Other

## 2016-09-19 ENCOUNTER — Encounter (HOSPITAL_COMMUNITY): Payer: Self-pay | Admitting: Emergency Medicine

## 2016-09-19 ENCOUNTER — Emergency Department (HOSPITAL_COMMUNITY)
Admission: EM | Admit: 2016-09-19 | Discharge: 2016-09-19 | Disposition: A | Discharge status: Home / Self Care | Payer: Medicare Other | Attending: Emergency Medicine | Admitting: Emergency Medicine

## 2016-09-19 DIAGNOSIS — Y999 Unspecified external cause status: Secondary | ICD-10-CM | POA: Insufficient documentation

## 2016-09-19 DIAGNOSIS — E039 Hypothyroidism, unspecified: Secondary | ICD-10-CM | POA: Insufficient documentation

## 2016-09-19 DIAGNOSIS — S0181XA Laceration without foreign body of other part of head, initial encounter: Secondary | ICD-10-CM | POA: Diagnosis not present

## 2016-09-19 DIAGNOSIS — I10 Essential (primary) hypertension: Secondary | ICD-10-CM | POA: Diagnosis not present

## 2016-09-19 DIAGNOSIS — F0390 Unspecified dementia without behavioral disturbance: Secondary | ICD-10-CM | POA: Diagnosis not present

## 2016-09-19 DIAGNOSIS — Y939 Activity, unspecified: Secondary | ICD-10-CM | POA: Insufficient documentation

## 2016-09-19 DIAGNOSIS — S8992XA Unspecified injury of left lower leg, initial encounter: Secondary | ICD-10-CM | POA: Diagnosis not present

## 2016-09-19 DIAGNOSIS — Z79899 Other long term (current) drug therapy: Secondary | ICD-10-CM | POA: Insufficient documentation

## 2016-09-19 DIAGNOSIS — W19XXXA Unspecified fall, initial encounter: Secondary | ICD-10-CM | POA: Insufficient documentation

## 2016-09-19 DIAGNOSIS — Y929 Unspecified place or not applicable: Secondary | ICD-10-CM | POA: Insufficient documentation

## 2016-09-19 DIAGNOSIS — Z7982 Long term (current) use of aspirin: Secondary | ICD-10-CM | POA: Insufficient documentation

## 2016-09-19 DIAGNOSIS — S0101XA Laceration without foreign body of scalp, initial encounter: Secondary | ICD-10-CM | POA: Insufficient documentation

## 2016-09-19 DIAGNOSIS — S0990XA Unspecified injury of head, initial encounter: Secondary | ICD-10-CM | POA: Diagnosis not present

## 2016-09-19 DIAGNOSIS — S8991XA Unspecified injury of right lower leg, initial encounter: Secondary | ICD-10-CM | POA: Diagnosis not present

## 2016-09-19 DIAGNOSIS — S0993XA Unspecified injury of face, initial encounter: Secondary | ICD-10-CM | POA: Diagnosis not present

## 2016-09-19 MED ORDER — LIDOCAINE-EPINEPHRINE (PF) 2 %-1:200000 IJ SOLN
10.0000 mL | Freq: Once | INTRAMUSCULAR | Status: AC
  Administered 2016-09-19: 10 mL
  Filled 2016-09-19: qty 20

## 2016-09-19 NOTE — ED Notes (Signed)
Patient transported to CT 

## 2016-09-19 NOTE — ED Notes (Signed)
Bed: IF12 Expected date:  Expected time:  Means of arrival:  Comments: EMS 83yo, fall, head lac

## 2016-09-19 NOTE — ED Triage Notes (Signed)
Per EMS pt from [facility] assisted [facility] had unwitnessed fall today, no memory of falling. Laceration to posterior head and anterior head, hematoma to anterior head, bruise to right knee. Hx dementia, currently mentating at baseline. No anticoagulants. DNR.

## 2016-09-19 NOTE — ED Provider Notes (Signed)
[facility] DEPT Provider Note   CSN: [id number] Arrival date & time: 09/19/16  2409     History   Chief Complaint Chief Complaint  Patient presents with  . Fall  . Head Laceration    HPI Melissa Carroll is a 83 y.o. female.  HPI Level V caveat due to dementia. Patient found on the floor after presumed fall. Patient cannot say what happened. Laceration top of head and hematoma and laceration to forehead. Some bruising to the knees also. Patient without complaints but also has some difficulty hearing. Past Medical History:  Diagnosis Date  . Arthritis   . Cataracts, bilateral   . Closed fracture of unspecified part of lower end of humerus   . Dementia   . Hypertension   . Insomnia, unspecified   . Macular degeneration (senile) of retina, unspecified   . Osteoarthritis   . Osteoarthrosis, unspecified whether generalized or localized, unspecified site   . Pancreatitis, acute 08/14/2013  . Retinal neovascularization NOS   . Senile osteoporosis   . Subarachnoid hemorrhage following injury ([place]) 03/31/2012  . Unspecified hearing loss   . Unspecified hypothyroidism   . Urinary tract infection 08/10/2013  . Vertebral fracture 08/09/2013   T7 10%, T12 70%     Patient Active Problem List   Diagnosis Date Noted  . Cerebrovascular disease 07/15/2016  . Cerebral atrophy 07/15/2016  . Meningioma ([place]) 07/15/2016  . Urine incontinence 01/04/2016  . Hypertensive retinopathy 01/24/2015  . Posterior vitreous detachment 01/24/2015  . Pseudoaphakia 01/24/2015  . Chorioretinal scar, macular 10/04/2014  . Lower back pain 07/19/2014  . Memory deficit 01/07/2014  . GERD (gastroesophageal reflux disease) 09/14/2013  . Musculoskeletal pain 08/14/2013  . Hyperglycemia 08/14/2013  . Senile osteoporosis 08/10/2013  . Fracture, vertebra, pathologic 08/09/2013  . AMD (age-related macular degeneration), wet ([place]) 02/18/2013  . Constipation 09/11/2012  . Hypothyroidism 08/14/2012  .  Osteoarthritis 08/14/2012  . Insomnia 08/14/2012  . Hypertension 04/02/2012  . History of fall 03/31/2012  . Hard of hearing 03/31/2012  . Subarachnoid hemorrhage following injury ([place]) 03/31/2012    Past Surgical History:  Procedure Laterality Date  . ABDOMINAL HYSTERECTOMY  1942  . ANKLE FRACTURE SURGERY  1960  . BLADDER SUSPENSION    . CLOSED REDUCTION WITH HUMERAL PIN INSERTION  04/01/2012   Procedure: CLOSED REDUCTION WITH HUMERAL PIN INSERTION;  Surgeon: Schuyler Amor, MD;  Location: [facility];  Service: Orthopedics;  Laterality: Left;    OB History    No data available       Home Medications    Prior to Admission medications   Medication Sig Start Date End Date Taking? Authorizing Provider  acetaminophen (TYLENOL) 500 MG tablet Take 500 mg by mouth every 6 (six) hours as needed. Pain   Yes [provider]  amLODipine (NORVASC) 10 MG tablet Take 10 mg by mouth daily.   Yes [provider]  aspirin EC 81 MG tablet Take 81 mg by mouth daily.   Yes [provider]  famotidine (PEPCID) 20 MG tablet Take 20 mg by mouth daily as needed for heartburn.    Yes [provider]  ketoconazole (NIZORAL) 2 % shampoo Apply 1 application topically. Shampoo once a week on Wednesday   Yes [provider]  levothyroxine (SYNTHROID, LEVOTHROID) 50 MCG tablet Take 50 mcg by mouth daily before breakfast.   Yes [provider]  losartan (COZAAR) 100 MG tablet Take 100 mg by mouth daily.   Yes [provider]  metoprolol (LOPRESSOR) 100 MG tablet Take 100 mg by mouth. Take one tablet twice daily   Yes [provider]  olive oil external oil Apply topically. Instill 1 drop in each ear at bedtime   Yes [provider]  raloxifene (EVISTA) 60 MG tablet Take 60 mg by mouth daily.   Yes [provider]  senna-docusate (SENOKOT-S) 8.6-50 MG per tablet Take 2 tablets by mouth at bedtime.   Yes [provider]  traZODone (DESYREL) 50 MG tablet Take 25 mg by mouth at bedtime.    Yes [provider]    Family History Family History  Problem Relation Age of Onset  . Hypertension Mother     Social History Social History  Substance Use Topics  . Smoking status: Never Smoker  . Smokeless tobacco: Never Used  . Alcohol use No     Allergies   Patient has no known allergies.   Review of Systems Review of Systems  Unable to perform ROS: Dementia     Physical Exam Updated Vital Signs BP (!) 162/77 (BP Location: Right Arm)   Pulse 65   Temp 98 F (36.7 C) (Oral)   Resp 16   SpO2 98%   Physical Exam  Constitutional: She appears well-developed.  HENT:  Hematoma with 2 small lacerations to forehead. Each is lacerations under a centimeter. Left occipital parietal area has a approximate 5 cm curved flap laceration. No underlying bony tenderness.  Eyes: Pupils are equal, round, and reactive to light.  Neck: Neck supple.  Painless range of motion.  Cardiovascular: Normal rate.   Pulmonary/Chest: Effort normal.  Abdominal: Soft. She exhibits no distension.  Musculoskeletal: She exhibits no edema.  Tenderness to bilateral anterior proximal lower legs. There is a small skin tear on the right lower leg with ecchymosis on both sides. No vascular tach of her feet. Knees appear nontender.  Neurological: She is alert.  Awake and pleasant with some mild dementia.  Skin: Skin is warm. Capillary refill takes less than 2 seconds.     ED Treatments / Results  Labs (all labs ordered are listed, but only abnormal results are displayed) Labs Reviewed - No data to display  EKG  EKG Interpretation None       Radiology Dg Tibia/fibula Left  Result Date: 09/19/2016 CLINICAL DATA:  Unwitnessed fall, ecchymosis in the left lower extremity EXAM: LEFT TIBIA AND FIBULA - 2 VIEW COMPARISON:  None. FINDINGS: No fracture. No suspicious focal osseous lesion. No radiopaque  foreign body. Small enthesophyte at the superior left patella. Degenerative changes at the left ankle joint. Small Achilles left calcaneal spur. Degenerative changes in the dorsal left tarsal joints. IMPRESSION: No fracture. Electronically Signed   By: Ilona Sorrel M.D.   On: 09/19/2016 09:34   Dg Tibia/fibula Right  Result Date: 09/19/2016 CLINICAL DATA:  Fall EXAM: RIGHT TIBIA AND FIBULA - 2 VIEW COMPARISON:  04/22/2011 FINDINGS: Advanced degenerative changes in the right knee. No acute bony abnormality. Specifically, no fracture, subluxation, or dislocation. Soft tissues are intact. IMPRESSION: No acute bony abnormality. Electronically Signed   By: Rolm Baptise M.D.   On: 09/19/2016 09:35   Ct Head Wo Contrast  Result Date: 09/19/2016 CLINICAL DATA:  Fall.  Forehead laceration and hematoma EXAM: CT HEAD WITHOUT CONTRAST TECHNIQUE: Contiguous axial images were obtained from the base of the skull through the vertex without intravenous contrast. COMPARISON:  06/26/2016 FINDINGS: Brain: There is atrophy and chronic small vessel disease changes. Bold left cerebellar  infarct. No acute intracranial abnormality. Specifically, no hemorrhage, hydrocephalus, mass lesion, acute infarction, or significant intracranial injury. Vascular: No hyperdense vessel or unexpected calcification. Skull: No acute calvarial abnormality. Sinuses/Orbits: Visualized paranasal sinuses and mastoids clear. Orbital soft tissues unremarkable. Other: Forehead soft tissue swelling. IMPRESSION: No acute intracranial abnormality. Atrophy, chronic microvascular disease. Electronically Signed   By: Rolm Baptise M.D.   On: 09/19/2016 09:42    Procedures Procedures (including critical care time)  Medications Ordered in ED Medications  lidocaine-EPINEPHrine (XYLOCAINE W/EPI) 2 %-1:200000 (PF) injection 10 mL (10 mLs Infiltration Given by Other 09/19/16 1791)     Initial Impression / Assessment and Plan / ED Course  I have reviewed the  triage vital signs and the nursing notes.  Pertinent labs & imaging results that were available during my care of the patient were reviewed by me and considered in my medical decision making (see chart for details).     Patient with fall and lacerations to forehead and scalp. 9 staples to scalp and Dermabond to forehead. Head CT reassuring. X-rays of lower legs reassuring. Discharge back to nursing home.  LACERATION REPAIR Performed by: Mackie Pai Authorized by: Mackie Pai Consent: Verbal consent obtained. Risks and benefits: risks, benefits and alternatives were discussed Consent given by: patient Patient identity confirmed: provided demographic data Prepped and Draped in normal sterile fashion Wound explored  Laceration Location: scalp  Laceration Length: 5cm  No Foreign Bodies seen or palpated  Anesthesia: local infiltration  Local anesthetic: lidocaine 1% with epinephrine  Anesthetic total: 8 ml  Irrigation method: syringe Amount of cleaning: standard  Skin closure: staples  Number of staples: 9  Patient tolerance: Patient tolerated the procedure well with no immediate complications.  LACERATION REPAIR Performed by: Mackie Pai Authorized by: Mackie Pai Consent: Verbal consent obtained. Risks and benefits: risks, benefits and alternatives were discussed Consent given by: patient Patient identity confirmed: provided demographic data Prepped and Draped in normal sterile fashion Wound explored  Laceration Location: Forehead  Laceration Length: 0.5cm  No Foreign Bodies seen or palpated  Skin scrub  Skin closure: dermabond  Patient tolerance: Patient tolerated the procedure well with no immediate complications.  Final Clinical Impressions(s) / ED Diagnoses   Final diagnoses:  Laceration of scalp, initial encounter  Facial laceration, initial encounter  Fall, initial encounter    New Prescriptions New Prescriptions    No medications on file     Davonna Belling, MD 09/19/16 1046

## 2016-09-19 NOTE — Discharge Instructions (Signed)
Have the staples taken out in 10-14 days

## 2016-09-20 ENCOUNTER — Encounter: Payer: Self-pay | Admitting: Nurse Practitioner

## 2016-09-20 ENCOUNTER — Non-Acute Institutional Stay (SKILLED_NURSING_FACILITY): Payer: Medicare Other | Admitting: Nurse Practitioner

## 2016-09-20 DIAGNOSIS — I10 Essential (primary) hypertension: Secondary | ICD-10-CM

## 2016-09-20 DIAGNOSIS — R413 Other amnesia: Secondary | ICD-10-CM

## 2016-09-20 DIAGNOSIS — E039 Hypothyroidism, unspecified: Secondary | ICD-10-CM | POA: Diagnosis not present

## 2016-09-20 DIAGNOSIS — K5900 Constipation, unspecified: Secondary | ICD-10-CM

## 2016-09-20 DIAGNOSIS — N3946 Mixed incontinence: Secondary | ICD-10-CM | POA: Diagnosis not present

## 2016-09-20 DIAGNOSIS — M150 Primary generalized (osteo)arthritis: Secondary | ICD-10-CM | POA: Diagnosis not present

## 2016-09-20 DIAGNOSIS — M159 Polyosteoarthritis, unspecified: Secondary | ICD-10-CM

## 2016-09-20 DIAGNOSIS — S0101XD Laceration without foreign body of scalp, subsequent encounter: Secondary | ICD-10-CM | POA: Diagnosis not present

## 2016-09-20 DIAGNOSIS — K219 Gastro-esophageal reflux disease without esophagitis: Secondary | ICD-10-CM | POA: Diagnosis not present

## 2016-09-20 DIAGNOSIS — F5101 Primary insomnia: Secondary | ICD-10-CM

## 2016-09-20 NOTE — Assessment & Plan Note (Signed)
Stable, continue Senokot S II nightly.

## 2016-09-20 NOTE — Assessment & Plan Note (Signed)
Stable, continue Famotidine 20mg daily.   

## 2016-09-20 NOTE — Assessment & Plan Note (Signed)
Adult briefs for urinary incontinence care

## 2016-09-20 NOTE — Assessment & Plan Note (Signed)
Stable, continue Trazodone 25mg  daily. Off prn Ativan 12/28/15

## 2016-09-20 NOTE — Assessment & Plan Note (Signed)
Memory lapses, refused MMSE, SNF for care assistance.

## 2016-09-20 NOTE — Progress Notes (Signed)
Location:  [facility] Room Number: 2 Place of Service:  SNF (31) Provider:  Alya Smaltz, [place]  NP  Estill Dooms, MD  Patient Care Team: Estill Dooms, MD as PCP - General (Internal Medicine) Auron Tadros X, NP as Nurse Practitioner (Nurse Practitioner) Charlotte Crumb, MD as Consulting Physician (Orthopedic Surgery) Regal, Tamala Fothergill, DPM as Consulting Physician (Podiatry) Gaynelle Arabian, MD as Consulting Physician (Orthopedic Surgery) Zebedee Iba., MD as Referring Physician (Ophthalmology) Newman Pies, MD as Consulting Physician (Neurosurgery) Melina Modena, [facility] (Skilled Nursing and [place])  Extended Emergency Contact Information Primary Emergency Contact: [place] of [place] Phone: [phone] Mobile Phone: [phone] Relation: Daughter Secondary Emergency Contact: [place] of [place] Phone: [phone] Relation: Grandson  Code Status:  DNR Goals of care: Advanced Directive information Advanced Directives 09/20/2016  Does Patient Have a Medical Advance Directive? Yes  Type of Advance Directive Out of facility DNR (pink MOST or yellow form);[place];Living will  Does patient want to make changes to medical advance directive? No - Patient declined  Copy of [place] in Chart? Yes  Pre-existing out of facility DNR order (yellow form or pink MOST form) Yellow form placed in chart (order not valid for inpatient use)     Chief Complaint  Patient presents with  . Acute Visit    [place] on 5/10--> laceration to her crown, hematoma to forehead.    HPI:  Pt is a 83 y.o. female seen today for evaluation of fall, scalp laceration 09/19/16, 9 staples closure placed in ED, will be removed in 10-14 days, had CT head c/o contrast, X-ray R+L Tibia and Fibula, Tramadol 50mg  q6h prn for pain initiated after the patient returned to SNF [facility] where she resides.  She denied HA, dizziness, nausea, vomiting, change in vision, LOC, or focal neurological symptoms.    Hx of GERD, stable while on Famotidine 20mg  daily, memory lapses, resides in SNF for care assistance,  insomnia, sleeps with aid of Trazodone 25mg , back pain, chronic, ambulates with walker, prn Tylenol available to her, thyroid, taking Levothyroxine 51mcg, last TSH 3.64 07/01/16, HTN, controlled on Amlodipine 10mg , Losartan 100mg , Metoprolol 100mg  bid. Not constipation, taking Senokot S II qhs.              Past Medical History:  Diagnosis Date  . Arthritis   . Cataracts, bilateral   . Closed fracture of unspecified part of lower end of humerus   . Dementia   . Hypertension   . Insomnia, unspecified   . Macular degeneration (senile) of retina, unspecified   . Osteoarthritis   . Osteoarthrosis, unspecified whether generalized or localized, unspecified site   . Pancreatitis, acute 08/14/2013  . Retinal neovascularization NOS   . Senile osteoporosis   . Subarachnoid hemorrhage following injury ([place]) 03/31/2012  . Unspecified hearing loss   . Unspecified hypothyroidism   . Urinary tract infection 08/10/2013  . Vertebral fracture 08/09/2013   T7 10%, T12 70%    Past Surgical History:  Procedure Laterality Date  . ABDOMINAL HYSTERECTOMY  1942  . ANKLE FRACTURE SURGERY  1960  . BLADDER SUSPENSION    . CLOSED REDUCTION WITH HUMERAL PIN INSERTION  04/01/2012   Procedure: CLOSED REDUCTION WITH HUMERAL PIN INSERTION;  Surgeon: Schuyler Amor, MD;  Location: [facility] ORS;  Service: Orthopedics;  Laterality: Left;    No Known Allergies  Outpatient Encounter Prescriptions as of 09/20/2016  Medication Sig  .  acetaminophen (TYLENOL) 500 MG tablet Take 500 mg by mouth every 6 (six) hours as needed. Pain  . amLODipine (NORVASC) 10 MG tablet Take 10 mg by mouth daily.  Marland Kitchen aspirin EC 81 MG tablet Take 81 mg by mouth daily.  . famotidine (PEPCID) 20 MG tablet Take 20 mg by mouth daily as needed for  heartburn.   Marland Kitchen ketoconazole (NIZORAL) 2 % shampoo Apply 1 application topically. Shampoo once a week on Wednesday  . levothyroxine (SYNTHROID, LEVOTHROID) 50 MCG tablet Take 50 mcg by mouth daily before breakfast.  . losartan (COZAAR) 100 MG tablet Take 100 mg by mouth daily.  . metoprolol (LOPRESSOR) 100 MG tablet Take 100 mg by mouth. Take one tablet twice daily  . olive oil external oil Apply topically. Instill 1 drop in each ear at bedtime  . raloxifene (EVISTA) 60 MG tablet Take 60 mg by mouth daily.  Marland Kitchen senna-docusate (SENOKOT-S) 8.6-50 MG per tablet Take 2 tablets by mouth at bedtime.  . traMADol (ULTRAM) 50 MG tablet Take 50 mg by mouth every 6 (six) hours as needed.  . traZODone (DESYREL) 50 MG tablet Take 25 mg by mouth at bedtime.    No facility-administered encounter medications on file as of 09/20/2016.     Review of Systems  Constitutional: Negative for activity change, appetite change, chills, diaphoresis, fatigue, fever and unexpected weight change.  HENT: Positive for hearing loss. Negative for congestion, ear discharge, postnasal drip, rhinorrhea, trouble swallowing and voice change.   Eyes: Negative for pain, discharge, itching and visual disturbance.  Respiratory: Negative for cough, choking, chest tightness, shortness of breath and wheezing.   Cardiovascular: Negative for chest pain, palpitations and leg swelling.  Gastrointestinal: Negative for abdominal pain, constipation, diarrhea, nausea and vomiting.  Endocrine: Negative for cold intolerance, heat intolerance, polydipsia, polyphagia and polyuria.       Compensated hypothyroidismism  Genitourinary: Negative for dysuria, flank pain, frequency and urgency.       Urinary  incontinence  Musculoskeletal: Positive for arthralgias (left arm), back pain (improved) and gait problem. Negative for neck pain and neck stiffness.  Skin: Negative for pallor, rash and wound.       Multiple SK and seborrhea of the scalp. Scalp  laceration with 9 staple closure left parietal, right facial bruise,  abrasion R+L lower legs  Allergic/Immunologic: Negative.   Neurological: Negative for tremors, syncope, speech difficulty, weakness, numbness and headaches.       Demented  Hematological: Negative.   Psychiatric/Behavioral: Positive for confusion (occasionally), decreased concentration and sleep disturbance. Negative for agitation, behavioral problems, dysphoric mood and hallucinations. The patient is not nervous/anxious.     Immunization History  Administered Date(s) Administered  . Influenza-Unspecified 02/15/2014, 02/16/2015, 02/22/2016  . PPD Test 03/23/2012   Pertinent  Health Maintenance Due  Topic Date Due  . DEXA SCAN  12/29/1983  . PNA vac Low Risk Adult (1 of 2 - PCV13) 12/29/1983  . INFLUENZA VACCINE  12/11/2016   Fall Risk  02/07/2015 02/22/2014 02/22/2014  Falls in the past year? No Yes No  Number falls in past yr: - 1 -  Risk for fall due to : - History of fall(s) -   Functional Status Survey:    Vitals:   09/20/16 1235  BP: 133/63  Pulse: 84  Resp: (!) 21  Temp: 98 F (36.7 C)  SpO2: 94%  Weight: 156 lb (70.8 kg)  Height: 5' 1.5" (1.562 m)   Body mass index is 29 kg/m. Physical Exam  Constitutional: She appears well-developed and well-nourished. No distress.  HENT:  Head: Normocephalic and atraumatic.  Right Ear: External ear normal.  Left Ear: External ear normal.  Nose: Nose normal.  Mouth/Throat: Oropharynx is clear and moist. No oropharyngeal exudate.  Severe bilateral hearing loss  Eyes: Conjunctivae and EOM are normal. Pupils are equal, round, and reactive to light. No scleral icterus.  Neck: Normal range of motion. Neck supple. No JVD present. No tracheal deviation present. No thyromegaly present.  Cardiovascular: Normal rate, regular rhythm, normal heart sounds and intact distal pulses.  Exam reveals no gallop and no friction rub.   No murmur heard. Pulmonary/Chest: Effort  normal and breath sounds normal. No respiratory distress. She has no wheezes. She has no rales. She exhibits no tenderness.  Abdominal: Soft. Bowel sounds are normal. She exhibits no distension and no mass. There is no tenderness. There is no rebound and no guarding.  Musculoskeletal: Normal range of motion. She exhibits tenderness. She exhibits no edema.  Demented Unsteady gait. Lower back across R+L SIJ-better managed with Norco bid  Lymphadenopathy:    She has no cervical adenopathy.  Neurological: She is alert. She displays normal reflexes. No cranial nerve deficit. She exhibits normal muscle tone. Coordination normal.  Forgetful. Short-term memory deficits.  Skin: Skin is warm and dry. No rash noted. She is not diaphoretic. No erythema. No pallor.  Scalp laceration with 9 staple closure left parietal, right facial bruise,  abrasion R+L lower legs   Psychiatric: Her mood appears not anxious. Her affect is not angry, not labile and not inappropriate. Her speech is not rapid and/or pressured, not delayed and not slurred. She is not agitated, not aggressive, not slowed and not withdrawn. Thought content is not paranoid and not delusional. Cognition and memory are impaired. She expresses impulsivity and inappropriate judgment. Depressed: flat affect. She exhibits abnormal recent memory.    Labs reviewed:  Recent Labs  01/08/16 07/01/16 07/18/16  NA 141 140 139  K 4.1 3.9 4.0  BUN 16 14 17   CREATININE 0.7 0.6 0.6    Recent Labs  07/01/16  AST 15  ALT 16  ALKPHOS 39    Recent Labs  07/01/16  WBC 6.0  HGB 13.3  HCT 40  PLT 194   Lab Results  Component Value Date   TSH 3.64 07/01/2016   Lab Results  Component Value Date   HGBA1C 6.7 07/18/2016   No results found for: CHOL, HDL, LDLCALC, LDLDIRECT, TRIG, CHOLHDL  Significant Diagnostic Results in last 30 days:  Dg Tibia/fibula Left  Result Date: 09/19/2016 CLINICAL DATA:  Unwitnessed fall, ecchymosis in the left lower  extremity EXAM: LEFT TIBIA AND FIBULA - 2 VIEW COMPARISON:  None. FINDINGS: No fracture. No suspicious focal osseous lesion. No radiopaque foreign body. Small enthesophyte at the superior left patella. Degenerative changes at the left ankle joint. Small Achilles left calcaneal spur. Degenerative changes in the dorsal left tarsal joints. IMPRESSION: No fracture. Electronically Signed   By: Ilona Sorrel M.D.   On: 09/19/2016 09:34   Dg Tibia/fibula Right  Result Date: 09/19/2016 CLINICAL DATA:  Fall EXAM: RIGHT TIBIA AND FIBULA - 2 VIEW COMPARISON:  04/22/2011 FINDINGS: Advanced degenerative changes in the right knee. No acute bony abnormality. Specifically, no fracture, subluxation, or dislocation. Soft tissues are intact. IMPRESSION: No acute bony abnormality. Electronically Signed   By: Rolm Baptise M.D.   On: 09/19/2016 09:35   Ct Head Wo Contrast  Result Date: 09/19/2016 CLINICAL DATA:  Fall.  Forehead laceration and hematoma EXAM: CT HEAD WITHOUT CONTRAST TECHNIQUE: Contiguous axial images were obtained from the base of the skull through the vertex without intravenous contrast. COMPARISON:  06/26/2016 FINDINGS: Brain: There is atrophy and chronic small vessel disease changes. Bold left cerebellar infarct. No acute intracranial abnormality. Specifically, no hemorrhage, hydrocephalus, mass lesion, acute infarction, or significant intracranial injury. Vascular: No hyperdense vessel or unexpected calcification. Skull: No acute calvarial abnormality. Sinuses/Orbits: Visualized paranasal sinuses and mastoids clear. Orbital soft tissues unremarkable. Other: Forehead soft tissue swelling. IMPRESSION: No acute intracranial abnormality. Atrophy, chronic microvascular disease. Electronically Signed   By: Rolm Baptise M.D.   On: 09/19/2016 09:42    Assessment/Plan Scalp laceration, subsequent encounter  fall, scalp laceration 09/19/16, 9 staples closure placed in ED, will be removed in 10-14 days, had CT head  c/o contrast, X-ray R+L Tibia and Fibula, Tramadol 50mg  q6h prn for pain initiated after the patient returned to SNF [facility] where she resides. She denied HA, dizziness, nausea, vomiting, change in vision, LOC, or focal neurological symptoms. Update CBC CMP   Hypertension Controlled, continue Amlodipine 10mg , Losartan 100mg , Metoprolol 100mg  bid  Constipation Stable, continue Senokot S II nightly.   GERD (gastroesophageal reflux disease) Stable, continue Famotidine 20mg  daily  Hypothyroidism Continue Levothyroxine 37mcg, TSH 3.64 07/01/16  Osteoarthritis Pain is reasonably controlled. Continue prn Tylenol  Memory deficit Memory lapses, refused MMSE, SNF for care assistance.   Urine incontinence Adult briefs for urinary incontinence care  Insomnia Stable, continue Trazodone 25mg  daily. Off prn Ativan 12/28/15    Family/ staff Communication: SNF  Labs/tests ordered:  Repeat UA C/S, CBC CMP

## 2016-09-20 NOTE — Assessment & Plan Note (Addendum)
fall, scalp laceration 09/19/16, 9 staples closure placed in ED, will be removed in 10-14 days, had CT head c/o contrast, X-ray R+L Tibia and Fibula, Tramadol 50mg  q6h prn for pain initiated after the patient returned to SNF [facility] where she resides. She denied HA, dizziness, nausea, vomiting, change in vision, LOC, or focal neurological symptoms. Update CBC CMP

## 2016-09-20 NOTE — Progress Notes (Signed)
Location:  [facility] Room Number: 2 Place of Service:  SNF (31) Provider:  Mast, Manxie   NP  Estill Dooms, MD  Patient Care Team: Estill Dooms, MD as PCP - General (Internal Medicine) Mast, Man X, NP as Nurse Practitioner (Nurse Practitioner) Charlotte Crumb, MD as Consulting Physician (Orthopedic Surgery) Regal, Tamala Fothergill, DPM as Consulting Physician (Podiatry) Gaynelle Arabian, MD as Consulting Physician (Orthopedic Surgery) Zebedee Iba., MD as Referring Physician (Ophthalmology) Newman Pies, MD as Consulting Physician (Neurosurgery) Melina Modena, [facility] (Skilled Nursing and [place])  Extended Emergency Contact Information Primary Emergency Contact: [place] of [place] Phone: [phone] Mobile Phone: [phone] Relation: Daughter Secondary Emergency Contact: [place] of [place] Phone: [phone] Relation: Grandson  Code Status:  DNR Goals of care: Advanced Directive information Advanced Directives 09/20/2016  Does Patient Have a Medical Advance Directive? Yes  Type of Advance Directive Out of facility DNR (pink MOST or yellow form);[place];Living will  Does patient want to make changes to medical advance directive? No - Patient declined  Copy of [place] in Chart? Yes  Pre-existing out of facility DNR order (yellow form or pink MOST form) Yellow form placed in chart (order not valid for inpatient use)     Chief Complaint  Patient presents with  . Acute Visit    [place] on 5/10--> laceration to her crown, hematoma to forehead.    HPI:  Pt is a 83 y.o. female seen today for an acute visit for    Past Medical History:  Diagnosis Date  . Arthritis   . Cataracts, bilateral   . Closed fracture of unspecified part of lower end of humerus   . Dementia   . Hypertension   . Insomnia, unspecified   . Macular degeneration  (senile) of retina, unspecified   . Osteoarthritis   . Osteoarthrosis, unspecified whether generalized or localized, unspecified site   . Pancreatitis, acute 08/14/2013  . Retinal neovascularization NOS   . Senile osteoporosis   . Subarachnoid hemorrhage following injury ([place]) 03/31/2012  . Unspecified hearing loss   . Unspecified hypothyroidism   . Urinary tract infection 08/10/2013  . Vertebral fracture 08/09/2013   T7 10%, T12 70%    Past Surgical History:  Procedure Laterality Date  . ABDOMINAL HYSTERECTOMY  1942  . ANKLE FRACTURE SURGERY  1960  . BLADDER SUSPENSION    . CLOSED REDUCTION WITH HUMERAL PIN INSERTION  04/01/2012   Procedure: CLOSED REDUCTION WITH HUMERAL PIN INSERTION;  Surgeon: Schuyler Amor, MD;  Location: [facility];  Service: Orthopedics;  Laterality: Left;    No Known Allergies  Outpatient Encounter Prescriptions as of 09/20/2016  Medication Sig  . acetaminophen (TYLENOL) 500 MG tablet Take 500 mg by mouth every 6 (six) hours as needed. Pain  . amLODipine (NORVASC) 10 MG tablet Take 10 mg by mouth daily.  Marland Kitchen aspirin EC 81 MG tablet Take 81 mg by mouth daily.  . famotidine (PEPCID) 20 MG tablet Take 20 mg by mouth daily as needed for heartburn.   Marland Kitchen ketoconazole (NIZORAL) 2 % shampoo Apply 1 application topically. Shampoo once a week on Wednesday  . levothyroxine (SYNTHROID, LEVOTHROID) 50 MCG tablet Take 50 mcg by mouth daily before breakfast.  . losartan (COZAAR) 100 MG tablet Take 100 mg by mouth daily.  . metoprolol (LOPRESSOR) 100 MG tablet Take 100 mg by mouth. Take one tablet twice daily  . olive  oil external oil Apply topically. Instill 1 drop in each ear at bedtime  . raloxifene (EVISTA) 60 MG tablet Take 60 mg by mouth daily.  Marland Kitchen senna-docusate (SENOKOT-S) 8.6-50 MG per tablet Take 2 tablets by mouth at bedtime.  . traMADol (ULTRAM) 50 MG tablet Take 50 mg by mouth every 6 (six) hours as needed.  . traZODone (DESYREL) 50 MG tablet Take 25 mg by mouth at  bedtime.    No facility-administered encounter medications on file as of 09/20/2016.     Review of Systems  Immunization History  Administered Date(s) Administered  . Influenza-Unspecified 02/15/2014, 02/16/2015, 02/22/2016  . PPD Test 03/23/2012   Pertinent  Health Maintenance Due  Topic Date Due  . DEXA SCAN  12/29/1983  . PNA vac Low Risk Adult (1 of 2 - PCV13) 12/29/1983  . INFLUENZA VACCINE  12/11/2016   Fall Risk  02/07/2015 02/22/2014 02/22/2014  Falls in the past year? No Yes No  Number falls in past yr: - 1 -  Risk for fall due to : - History of fall(s) -   Functional Status Survey:    Vitals:   09/20/16 1235  BP: 133/63  Pulse: 84  Resp: (!) 21  Temp: 98 F (36.7 C)  SpO2: 94%  Weight: 156 lb (70.8 kg)  Height: 5' 1.5" (1.562 m)   Body mass index is 29 kg/m. Physical Exam  Labs reviewed:  Recent Labs  01/08/16 07/01/16 07/18/16  NA 141 140 139  K 4.1 3.9 4.0  BUN 16 14 17   CREATININE 0.7 0.6 0.6    Recent Labs  07/01/16  AST 15  ALT 16  ALKPHOS 39    Recent Labs  07/01/16  WBC 6.0  HGB 13.3  HCT 40  PLT 194   Lab Results  Component Value Date   TSH 3.64 07/01/2016   Lab Results  Component Value Date   HGBA1C 6.7 07/18/2016   No results found for: CHOL, HDL, LDLCALC, LDLDIRECT, TRIG, CHOLHDL  Significant Diagnostic Results in last 30 days:  Dg Tibia/fibula Left  Result Date: 09/19/2016 CLINICAL DATA:  Unwitnessed fall, ecchymosis in the left lower extremity EXAM: LEFT TIBIA AND FIBULA - 2 VIEW COMPARISON:  None. FINDINGS: No fracture. No suspicious focal osseous lesion. No radiopaque foreign body. Small enthesophyte at the superior left patella. Degenerative changes at the left ankle joint. Small Achilles left calcaneal spur. Degenerative changes in the dorsal left tarsal joints. IMPRESSION: No fracture. Electronically Signed   By: Ilona Sorrel M.D.   On: 09/19/2016 09:34   Dg Tibia/fibula Right  Result Date: 09/19/2016 CLINICAL  DATA:  Fall EXAM: RIGHT TIBIA AND FIBULA - 2 VIEW COMPARISON:  04/22/2011 FINDINGS: Advanced degenerative changes in the right knee. No acute bony abnormality. Specifically, no fracture, subluxation, or dislocation. Soft tissues are intact. IMPRESSION: No acute bony abnormality. Electronically Signed   By: Rolm Baptise M.D.   On: 09/19/2016 09:35   Ct Head Wo Contrast  Result Date: 09/19/2016 CLINICAL DATA:  Fall.  Forehead laceration and hematoma EXAM: CT HEAD WITHOUT CONTRAST TECHNIQUE: Contiguous axial images were obtained from the base of the skull through the vertex without intravenous contrast. COMPARISON:  06/26/2016 FINDINGS: Brain: There is atrophy and chronic small vessel disease changes. Bold left cerebellar infarct. No acute intracranial abnormality. Specifically, no hemorrhage, hydrocephalus, mass lesion, acute infarction, or significant intracranial injury. Vascular: No hyperdense vessel or unexpected calcification. Skull: No acute calvarial abnormality. Sinuses/Orbits: Visualized paranasal sinuses and mastoids clear. Orbital soft tissues  unremarkable. Other: Forehead soft tissue swelling. IMPRESSION: No acute intracranial abnormality. Atrophy, chronic microvascular disease. Electronically Signed   By: Rolm Baptise M.D.   On: 09/19/2016 09:42    Assessment/Plan 1. Scalp laceration, subsequent encounter   2. Essential hypertension   3. Constipation, unspecified constipation type   4. Gastroesophageal reflux disease without esophagitis   5. Hypothyroidism, unspecified type   6. Primary osteoarthritis involving multiple joints   7. Memory deficit   8. Mixed stress and urge urinary incontinence   9. Primary insomnia     Family/ staff Communication:   Labs/tests ordered:

## 2016-09-20 NOTE — Assessment & Plan Note (Signed)
Pain is reasonably controlled. Continue prn Tylenol

## 2016-09-20 NOTE — Assessment & Plan Note (Signed)
Continue Levothyroxine 60mcg, TSH 3.64 07/01/16

## 2016-09-20 NOTE — Assessment & Plan Note (Signed)
Controlled, continue Amlodipine 10mg , Losartan 100mg , Metoprolol 100mg  bid

## 2016-09-23 DIAGNOSIS — M6281 Muscle weakness (generalized): Secondary | ICD-10-CM | POA: Diagnosis not present

## 2016-09-23 DIAGNOSIS — I10 Essential (primary) hypertension: Secondary | ICD-10-CM | POA: Diagnosis not present

## 2016-09-23 LAB — HEPATIC FUNCTION PANEL
ALT: 15 U/L (ref 7–35)
AST: 14 U/L (ref 13–35)
Alkaline Phosphatase: 35 U/L (ref 25–125)
Bilirubin, Total: 0.8 mg/dL

## 2016-09-23 LAB — BASIC METABOLIC PANEL
BUN: 15 mg/dL (ref 4–21)
CREATININE: 0.6 mg/dL (ref 0.5–1.1)
Glucose: 145 mg/dL
Potassium: 4 mmol/L (ref 3.4–5.3)
Sodium: 138 mmol/L (ref 137–147)

## 2016-09-23 LAB — CBC AND DIFFERENTIAL
HCT: 34 % — AB (ref 36–46)
Hemoglobin: 11.5 g/dL — AB (ref 12.0–16.0)
PLATELETS: 160 10*3/uL (ref 150–399)
WBC: 5.4 10*3/mL

## 2016-10-03 DIAGNOSIS — M1990 Unspecified osteoarthritis, unspecified site: Secondary | ICD-10-CM | POA: Diagnosis not present

## 2016-10-03 DIAGNOSIS — H35059 Retinal neovascularization, unspecified, unspecified eye: Secondary | ICD-10-CM | POA: Diagnosis not present

## 2016-10-03 DIAGNOSIS — H26103 Unspecified traumatic cataract, bilateral: Secondary | ICD-10-CM | POA: Diagnosis not present

## 2016-10-03 DIAGNOSIS — W19XXXA Unspecified fall, initial encounter: Secondary | ICD-10-CM | POA: Diagnosis not present

## 2016-10-03 DIAGNOSIS — G4700 Insomnia, unspecified: Secondary | ICD-10-CM | POA: Diagnosis not present

## 2016-10-03 DIAGNOSIS — M25579 Pain in unspecified ankle and joints of unspecified foot: Secondary | ICD-10-CM | POA: Diagnosis not present

## 2016-10-03 DIAGNOSIS — H9190 Unspecified hearing loss, unspecified ear: Secondary | ICD-10-CM | POA: Diagnosis not present

## 2016-10-03 DIAGNOSIS — S066X0A Traumatic subarachnoid hemorrhage without loss of consciousness, initial encounter: Secondary | ICD-10-CM | POA: Diagnosis not present

## 2016-10-03 DIAGNOSIS — M6281 Muscle weakness (generalized): Secondary | ICD-10-CM | POA: Diagnosis not present

## 2016-10-03 DIAGNOSIS — M159 Polyosteoarthritis, unspecified: Secondary | ICD-10-CM | POA: Diagnosis not present

## 2016-10-03 DIAGNOSIS — E039 Hypothyroidism, unspecified: Secondary | ICD-10-CM | POA: Diagnosis not present

## 2016-10-03 DIAGNOSIS — M545 Low back pain: Secondary | ICD-10-CM | POA: Diagnosis not present

## 2016-10-03 DIAGNOSIS — R41841 Cognitive communication deficit: Secondary | ICD-10-CM | POA: Diagnosis not present

## 2016-10-03 DIAGNOSIS — R296 Repeated falls: Secondary | ICD-10-CM | POA: Diagnosis not present

## 2016-10-03 DIAGNOSIS — R29898 Other symptoms and signs involving the musculoskeletal system: Secondary | ICD-10-CM | POA: Diagnosis not present

## 2016-10-03 DIAGNOSIS — I609 Nontraumatic subarachnoid hemorrhage, unspecified: Secondary | ICD-10-CM | POA: Diagnosis not present

## 2016-10-03 DIAGNOSIS — H3530 Unspecified macular degeneration: Secondary | ICD-10-CM | POA: Diagnosis not present

## 2016-10-03 DIAGNOSIS — S42409A Unspecified fracture of lower end of unspecified humerus, initial encounter for closed fracture: Secondary | ICD-10-CM | POA: Diagnosis not present

## 2016-10-03 DIAGNOSIS — I10 Essential (primary) hypertension: Secondary | ICD-10-CM | POA: Diagnosis not present

## 2016-10-03 DIAGNOSIS — M810 Age-related osteoporosis without current pathological fracture: Secondary | ICD-10-CM | POA: Diagnosis not present

## 2016-10-03 DIAGNOSIS — R2681 Unsteadiness on feet: Secondary | ICD-10-CM | POA: Diagnosis not present

## 2016-10-03 DIAGNOSIS — F0280 Dementia in other diseases classified elsewhere without behavioral disturbance: Secondary | ICD-10-CM | POA: Diagnosis not present

## 2016-10-04 DIAGNOSIS — R29898 Other symptoms and signs involving the musculoskeletal system: Secondary | ICD-10-CM | POA: Diagnosis not present

## 2016-10-04 DIAGNOSIS — E039 Hypothyroidism, unspecified: Secondary | ICD-10-CM | POA: Diagnosis not present

## 2016-10-04 DIAGNOSIS — R41841 Cognitive communication deficit: Secondary | ICD-10-CM | POA: Diagnosis not present

## 2016-10-04 DIAGNOSIS — F0280 Dementia in other diseases classified elsewhere without behavioral disturbance: Secondary | ICD-10-CM | POA: Diagnosis not present

## 2016-10-04 DIAGNOSIS — R296 Repeated falls: Secondary | ICD-10-CM | POA: Diagnosis not present

## 2016-10-04 DIAGNOSIS — M6281 Muscle weakness (generalized): Secondary | ICD-10-CM | POA: Diagnosis not present

## 2016-10-07 DIAGNOSIS — R41841 Cognitive communication deficit: Secondary | ICD-10-CM | POA: Diagnosis not present

## 2016-10-07 DIAGNOSIS — E039 Hypothyroidism, unspecified: Secondary | ICD-10-CM | POA: Diagnosis not present

## 2016-10-07 DIAGNOSIS — F0280 Dementia in other diseases classified elsewhere without behavioral disturbance: Secondary | ICD-10-CM | POA: Diagnosis not present

## 2016-10-07 DIAGNOSIS — R296 Repeated falls: Secondary | ICD-10-CM | POA: Diagnosis not present

## 2016-10-07 DIAGNOSIS — M6281 Muscle weakness (generalized): Secondary | ICD-10-CM | POA: Diagnosis not present

## 2016-10-07 DIAGNOSIS — R29898 Other symptoms and signs involving the musculoskeletal system: Secondary | ICD-10-CM | POA: Diagnosis not present

## 2016-10-08 DIAGNOSIS — R41841 Cognitive communication deficit: Secondary | ICD-10-CM | POA: Diagnosis not present

## 2016-10-08 DIAGNOSIS — R29898 Other symptoms and signs involving the musculoskeletal system: Secondary | ICD-10-CM | POA: Diagnosis not present

## 2016-10-08 DIAGNOSIS — R296 Repeated falls: Secondary | ICD-10-CM | POA: Diagnosis not present

## 2016-10-08 DIAGNOSIS — F0280 Dementia in other diseases classified elsewhere without behavioral disturbance: Secondary | ICD-10-CM | POA: Diagnosis not present

## 2016-10-08 DIAGNOSIS — M6281 Muscle weakness (generalized): Secondary | ICD-10-CM | POA: Diagnosis not present

## 2016-10-08 DIAGNOSIS — E039 Hypothyroidism, unspecified: Secondary | ICD-10-CM | POA: Diagnosis not present

## 2016-10-09 DIAGNOSIS — E039 Hypothyroidism, unspecified: Secondary | ICD-10-CM | POA: Diagnosis not present

## 2016-10-09 DIAGNOSIS — R296 Repeated falls: Secondary | ICD-10-CM | POA: Diagnosis not present

## 2016-10-09 DIAGNOSIS — M6281 Muscle weakness (generalized): Secondary | ICD-10-CM | POA: Diagnosis not present

## 2016-10-09 DIAGNOSIS — R41841 Cognitive communication deficit: Secondary | ICD-10-CM | POA: Diagnosis not present

## 2016-10-09 DIAGNOSIS — F0280 Dementia in other diseases classified elsewhere without behavioral disturbance: Secondary | ICD-10-CM | POA: Diagnosis not present

## 2016-10-09 DIAGNOSIS — R29898 Other symptoms and signs involving the musculoskeletal system: Secondary | ICD-10-CM | POA: Diagnosis not present

## 2016-10-10 DIAGNOSIS — R29898 Other symptoms and signs involving the musculoskeletal system: Secondary | ICD-10-CM | POA: Diagnosis not present

## 2016-10-10 DIAGNOSIS — R41841 Cognitive communication deficit: Secondary | ICD-10-CM | POA: Diagnosis not present

## 2016-10-10 DIAGNOSIS — F0280 Dementia in other diseases classified elsewhere without behavioral disturbance: Secondary | ICD-10-CM | POA: Diagnosis not present

## 2016-10-10 DIAGNOSIS — R296 Repeated falls: Secondary | ICD-10-CM | POA: Diagnosis not present

## 2016-10-10 DIAGNOSIS — M6281 Muscle weakness (generalized): Secondary | ICD-10-CM | POA: Diagnosis not present

## 2016-10-10 DIAGNOSIS — E039 Hypothyroidism, unspecified: Secondary | ICD-10-CM | POA: Diagnosis not present

## 2016-10-11 DIAGNOSIS — M25579 Pain in unspecified ankle and joints of unspecified foot: Secondary | ICD-10-CM | POA: Diagnosis not present

## 2016-10-11 DIAGNOSIS — I10 Essential (primary) hypertension: Secondary | ICD-10-CM | POA: Diagnosis not present

## 2016-10-11 DIAGNOSIS — E039 Hypothyroidism, unspecified: Secondary | ICD-10-CM | POA: Diagnosis not present

## 2016-10-11 DIAGNOSIS — S42409A Unspecified fracture of lower end of unspecified humerus, initial encounter for closed fracture: Secondary | ICD-10-CM | POA: Diagnosis not present

## 2016-10-11 DIAGNOSIS — M545 Low back pain: Secondary | ICD-10-CM | POA: Diagnosis not present

## 2016-10-11 DIAGNOSIS — H9190 Unspecified hearing loss, unspecified ear: Secondary | ICD-10-CM | POA: Diagnosis not present

## 2016-10-11 DIAGNOSIS — F0280 Dementia in other diseases classified elsewhere without behavioral disturbance: Secondary | ICD-10-CM | POA: Diagnosis not present

## 2016-10-11 DIAGNOSIS — R41841 Cognitive communication deficit: Secondary | ICD-10-CM | POA: Diagnosis not present

## 2016-10-11 DIAGNOSIS — M810 Age-related osteoporosis without current pathological fracture: Secondary | ICD-10-CM | POA: Diagnosis not present

## 2016-10-11 DIAGNOSIS — R296 Repeated falls: Secondary | ICD-10-CM | POA: Diagnosis not present

## 2016-10-11 DIAGNOSIS — M159 Polyosteoarthritis, unspecified: Secondary | ICD-10-CM | POA: Diagnosis not present

## 2016-10-11 DIAGNOSIS — H35059 Retinal neovascularization, unspecified, unspecified eye: Secondary | ICD-10-CM | POA: Diagnosis not present

## 2016-10-11 DIAGNOSIS — S066X0A Traumatic subarachnoid hemorrhage without loss of consciousness, initial encounter: Secondary | ICD-10-CM | POA: Diagnosis not present

## 2016-10-11 DIAGNOSIS — W19XXXA Unspecified fall, initial encounter: Secondary | ICD-10-CM | POA: Diagnosis not present

## 2016-10-11 DIAGNOSIS — M6281 Muscle weakness (generalized): Secondary | ICD-10-CM | POA: Diagnosis not present

## 2016-10-11 DIAGNOSIS — I609 Nontraumatic subarachnoid hemorrhage, unspecified: Secondary | ICD-10-CM | POA: Diagnosis not present

## 2016-10-11 DIAGNOSIS — G4700 Insomnia, unspecified: Secondary | ICD-10-CM | POA: Diagnosis not present

## 2016-10-11 DIAGNOSIS — H3530 Unspecified macular degeneration: Secondary | ICD-10-CM | POA: Diagnosis not present

## 2016-10-11 DIAGNOSIS — H26103 Unspecified traumatic cataract, bilateral: Secondary | ICD-10-CM | POA: Diagnosis not present

## 2016-10-11 DIAGNOSIS — R29898 Other symptoms and signs involving the musculoskeletal system: Secondary | ICD-10-CM | POA: Diagnosis not present

## 2016-10-11 DIAGNOSIS — M1990 Unspecified osteoarthritis, unspecified site: Secondary | ICD-10-CM | POA: Diagnosis not present

## 2016-10-11 DIAGNOSIS — R2681 Unsteadiness on feet: Secondary | ICD-10-CM | POA: Diagnosis not present

## 2016-10-14 DIAGNOSIS — R41841 Cognitive communication deficit: Secondary | ICD-10-CM | POA: Diagnosis not present

## 2016-10-14 DIAGNOSIS — F0280 Dementia in other diseases classified elsewhere without behavioral disturbance: Secondary | ICD-10-CM | POA: Diagnosis not present

## 2016-10-14 DIAGNOSIS — R29898 Other symptoms and signs involving the musculoskeletal system: Secondary | ICD-10-CM | POA: Diagnosis not present

## 2016-10-14 DIAGNOSIS — R296 Repeated falls: Secondary | ICD-10-CM | POA: Diagnosis not present

## 2016-10-14 DIAGNOSIS — E039 Hypothyroidism, unspecified: Secondary | ICD-10-CM | POA: Diagnosis not present

## 2016-10-14 DIAGNOSIS — M6281 Muscle weakness (generalized): Secondary | ICD-10-CM | POA: Diagnosis not present

## 2016-10-15 ENCOUNTER — Encounter: Payer: Self-pay | Admitting: Family

## 2016-10-15 ENCOUNTER — Non-Acute Institutional Stay (SKILLED_NURSING_FACILITY): Payer: Medicare Other | Admitting: Family

## 2016-10-15 DIAGNOSIS — I10 Essential (primary) hypertension: Secondary | ICD-10-CM

## 2016-10-15 DIAGNOSIS — K5901 Slow transit constipation: Secondary | ICD-10-CM | POA: Diagnosis not present

## 2016-10-15 DIAGNOSIS — E039 Hypothyroidism, unspecified: Secondary | ICD-10-CM

## 2016-10-15 DIAGNOSIS — G4700 Insomnia, unspecified: Secondary | ICD-10-CM | POA: Diagnosis not present

## 2016-10-15 DIAGNOSIS — K219 Gastro-esophageal reflux disease without esophagitis: Secondary | ICD-10-CM | POA: Diagnosis not present

## 2016-10-16 DIAGNOSIS — E039 Hypothyroidism, unspecified: Secondary | ICD-10-CM | POA: Diagnosis not present

## 2016-10-16 DIAGNOSIS — R29898 Other symptoms and signs involving the musculoskeletal system: Secondary | ICD-10-CM | POA: Diagnosis not present

## 2016-10-16 DIAGNOSIS — R41841 Cognitive communication deficit: Secondary | ICD-10-CM | POA: Diagnosis not present

## 2016-10-16 DIAGNOSIS — R296 Repeated falls: Secondary | ICD-10-CM | POA: Diagnosis not present

## 2016-10-16 DIAGNOSIS — F0280 Dementia in other diseases classified elsewhere without behavioral disturbance: Secondary | ICD-10-CM | POA: Diagnosis not present

## 2016-10-16 DIAGNOSIS — M6281 Muscle weakness (generalized): Secondary | ICD-10-CM | POA: Diagnosis not present

## 2016-10-16 NOTE — Progress Notes (Addendum)
Location:  [facility] Room Number: 2 Place of Service:  SNF (31) Provider: Dinah Ngetich FNP-C   Blanchie Serve, MD  Patient Care Team: Blanchie Serve, MD as PCP - General (Internal Medicine) Mast, Man X, NP as Nurse Practitioner (Nurse Practitioner) Charlotte Crumb, MD as Consulting Physician (Orthopedic Surgery) Wallene Huh, DPM as Consulting Physician (Podiatry) Gaynelle Arabian, MD as Consulting Physician (Orthopedic Surgery) Zebedee Iba., MD as Referring Physician (Ophthalmology) Newman Pies, MD as Consulting Physician (Neurosurgery) Melina Modena, [facility] (Skilled Nursing and [place])  Extended Emergency Contact Information Primary Emergency Contact: [place] of [place] Phone: [phone] Mobile Phone: [phone] Relation: Daughter Secondary Emergency Contact: [place] of [place] Phone: [phone] Relation: Grandson  Code Status: DNR  Goals of care: Advanced Directive information Advanced Directives 10/15/2016  Does Patient Have a Medical Advance Directive? Yes  Type of Advance Directive Out of facility DNR (pink MOST or yellow form)  Does patient want to make changes to medical advance directive? -  Copy of [place] in Chart? -  Pre-existing out of facility DNR order (yellow form or pink MOST form) Yellow form placed in chart (order not valid for inpatient use)     Chief Complaint  Patient presents with  . Medical Management of Chronic Issues    routine visit    HPI:  Pt is a 83 y.o. female seen today [place] for medical management of chronic diseases.She has a medical history of HTN, Hypothyroidism, GERD, OA,Constipation ,Osteoporosis, Dementia among other conditions. She is seen in her room today. No recent acute illness,fall or weight changes reported.she denies any acute issues this visit.         Past Medical History:    Diagnosis Date  . Arthritis   . Cataracts, bilateral   . Closed fracture of unspecified part of lower end of humerus   . Dementia   . Hypertension   . Insomnia, unspecified   . Macular degeneration (senile) of retina, unspecified   . Osteoarthritis   . Osteoarthrosis, unspecified whether generalized or localized, unspecified site   . Pancreatitis, acute 08/14/2013  . Retinal neovascularization NOS   . Senile osteoporosis   . Subarachnoid hemorrhage following injury ([place]) 03/31/2012  . Unspecified hearing loss   . Unspecified hypothyroidism   . Urinary tract infection 08/10/2013  . Vertebral fracture 08/09/2013   T7 10%, T12 70%    Past Surgical History:  Procedure Laterality Date  . ABDOMINAL HYSTERECTOMY  1942  . ANKLE FRACTURE SURGERY  1960  . BLADDER SUSPENSION    . CLOSED REDUCTION WITH HUMERAL PIN INSERTION  04/01/2012   Procedure: CLOSED REDUCTION WITH HUMERAL PIN INSERTION;  Surgeon: Schuyler Amor, MD;  [facility];  Service: Orthopedics;  Laterality: Left;    No Known Allergies  Allergies as of 10/15/2016   No Known Allergies     Medication List       Accurate as of 10/15/16 11:59 PM. Always use your most recent med list.          acetaminophen 500 MG tablet Commonly known as:  TYLENOL Take 500 mg by mouth every 6 (six) hours as needed. Pain   amLODipine 10 MG tablet Commonly known as:  NORVASC Take 10 mg by mouth daily.   aspirin EC 81 MG tablet Take 81 mg by mouth daily.   famotidine 20 MG tablet Commonly known as:  PEPCID Take 20 mg by  mouth daily as needed for heartburn.   ketoconazole 2 % shampoo Commonly known as:  NIZORAL Apply 1 application topically. Shampoo once a week on Wednesday   levothyroxine 50 MCG tablet Commonly known as:  SYNTHROID, LEVOTHROID Take 50 mcg by mouth daily before breakfast.   losartan 100 MG tablet Commonly known as:  COZAAR Take 100 mg by mouth daily.   metoprolol tartrate 100 MG tablet Commonly known  as:  LOPRESSOR Take 100 mg by mouth. Take one tablet twice daily   olive oil external oil Apply topically. Instill 1 drop in each ear at bedtime   raloxifene 60 MG tablet Commonly known as:  EVISTA Take 60 mg by mouth daily.   senna-docusate 8.6-50 MG tablet Commonly known as:  Senokot-S Take 2 tablets by mouth at bedtime.   traMADol 50 MG tablet Commonly known as:  ULTRAM Take 50 mg by mouth every 6 (six) hours as needed.   traZODone 50 MG tablet Commonly known as:  DESYREL Take 25 mg by mouth at bedtime.       Review of Systems  Constitutional: Negative for activity change, appetite change, chills, fatigue and fever.  HENT: Positive for hearing loss. Negative for congestion, rhinorrhea, sinus pain, sinus pressure, sneezing and sore throat.   Eyes: Negative.   Respiratory: Negative for cough, chest tightness, shortness of breath and wheezing.   Cardiovascular: Negative for chest pain, palpitations and leg swelling.  Gastrointestinal: Negative for abdominal distention, abdominal pain, constipation, diarrhea, nausea and vomiting.  Endocrine: Negative.   Genitourinary: Negative for dysuria, flank pain, frequency and urgency.  Musculoskeletal: Positive for arthralgias, back pain and gait problem. Negative for neck pain.  Skin: Negative for color change, pallor and rash.  Neurological: Negative for dizziness, seizures, syncope, light-headedness and headaches.  Hematological: Does not bruise/bleed easily.  Psychiatric/Behavioral: Positive for confusion. Negative for agitation, hallucinations and sleep disturbance. The patient is not nervous/anxious.     Immunization History  Administered Date(s) Administered  . Influenza-Unspecified 02/15/2014, 02/16/2015, 02/22/2016  . PPD Test 03/23/2012   Pertinent  Health Maintenance Due  Topic Date Due  . DEXA SCAN  12/29/1983  . PNA vac Low Risk Adult (1 of 2 - PCV13) 12/29/1983  . INFLUENZA VACCINE  12/11/2016   Fall Risk   02/07/2015 02/22/2014 02/22/2014  Falls in the past year? No Yes No  Number falls in past yr: - 1 -  Risk for fall due to : - History of fall(s) -    Vitals:   10/15/16 1000  BP: (!) 149/62  Pulse: 72  Resp: 20  Temp: 98.2 F (36.8 C)  SpO2: 92%  Weight: 156 lb (70.8 kg)  Height: 5' 1.5" (1.562 m)   Body mass index is 29 kg/m. Physical Exam  Constitutional:  Frail elderly in no acute distress   HENT:  Head: Normocephalic.  Mouth/Throat: Oropharynx is clear and moist. No oropharyngeal exudate.  Eyes: Conjunctivae and EOM are normal. Pupils are equal, round, and reactive to light. Right eye exhibits no discharge. Left eye exhibits no discharge. No scleral icterus.  Neck: Normal range of motion. No JVD present. No thyromegaly present.  Cardiovascular: Normal rate, regular rhythm, normal heart sounds and intact distal pulses.  Exam reveals no gallop and no friction rub.   No murmur heard. Pulmonary/Chest: Effort normal and breath sounds normal. No respiratory distress. She has no wheezes. She has no rales.  Abdominal: Soft. Bowel sounds are normal. She exhibits no distension. There is no tenderness. There is  no rebound and no guarding.  Musculoskeletal: She exhibits no edema, tenderness or deformity.  Moves x 4 extremities. Unsteady gait  Lymphadenopathy:    She has no cervical adenopathy.  Neurological:  pleasantly confused at her baseline per staff able to make needs known.   Skin: Skin is warm and dry. No rash noted. No erythema. No pallor.  Psychiatric:  Flat affect. Pleasantly confused at baseline     Labs reviewed:  Recent Labs  07/01/16 07/18/16 09/23/16  NA 140 139 138  K 3.9 4.0 4.0  BUN 14 17 15   CREATININE 0.6 0.6 0.6    Recent Labs  07/01/16 09/23/16  AST 15 14  ALT 16 15  ALKPHOS 39 35    Recent Labs  07/01/16 09/23/16  WBC 6.0 5.4  HGB 13.3 11.5*  HCT 40 34*  PLT 194 160   Lab Results  Component Value Date   TSH 3.64 07/01/2016   Lab  Results  Component Value Date   HGBA1C 6.7 07/18/2016    Assessment/Plan 1. Essential hypertension B/p stable. Continue on metoprolol 100 mg tablet daily, Losartan 100 mg Tablet daily and Norvasc 10 mg tablet daily. On ASA EC 81 mg Tablet for chest pain prophylaxis.   2. Hypothyroidism Lab Results  Component Value Date   TSH 3.64 07/01/2016  Continue on levothyroxine 50 mcg daily before brekfast. Continue to monitor TSH level.Recheck TSH next visit.   3. Gastroesophageal reflux disease without esophagitis [place]. Continue on famotidine 20 mg daily as needed.   4. Slow transit constipation Current regimen effective. Continue to encourage oral intake and hydration.   5. Insomnia Continue on trazodone 25 mg Tablet at bedtime.   Family/ staff Communication: Reviewed plan of care with patient and facility Nurse supervisor   Labs/tests ordered: None   Sandrea Hughs, NP

## 2016-10-17 DIAGNOSIS — F0280 Dementia in other diseases classified elsewhere without behavioral disturbance: Secondary | ICD-10-CM | POA: Diagnosis not present

## 2016-10-17 DIAGNOSIS — R41841 Cognitive communication deficit: Secondary | ICD-10-CM | POA: Diagnosis not present

## 2016-10-17 DIAGNOSIS — R296 Repeated falls: Secondary | ICD-10-CM | POA: Diagnosis not present

## 2016-10-17 DIAGNOSIS — M6281 Muscle weakness (generalized): Secondary | ICD-10-CM | POA: Diagnosis not present

## 2016-10-17 DIAGNOSIS — R29898 Other symptoms and signs involving the musculoskeletal system: Secondary | ICD-10-CM | POA: Diagnosis not present

## 2016-10-17 DIAGNOSIS — E039 Hypothyroidism, unspecified: Secondary | ICD-10-CM | POA: Diagnosis not present

## 2016-10-21 DIAGNOSIS — M6281 Muscle weakness (generalized): Secondary | ICD-10-CM | POA: Diagnosis not present

## 2016-10-21 DIAGNOSIS — R296 Repeated falls: Secondary | ICD-10-CM | POA: Diagnosis not present

## 2016-10-21 DIAGNOSIS — F0280 Dementia in other diseases classified elsewhere without behavioral disturbance: Secondary | ICD-10-CM | POA: Diagnosis not present

## 2016-10-21 DIAGNOSIS — E039 Hypothyroidism, unspecified: Secondary | ICD-10-CM | POA: Diagnosis not present

## 2016-10-21 DIAGNOSIS — R41841 Cognitive communication deficit: Secondary | ICD-10-CM | POA: Diagnosis not present

## 2016-10-21 DIAGNOSIS — R29898 Other symptoms and signs involving the musculoskeletal system: Secondary | ICD-10-CM | POA: Diagnosis not present

## 2016-10-22 DIAGNOSIS — R296 Repeated falls: Secondary | ICD-10-CM | POA: Diagnosis not present

## 2016-10-22 DIAGNOSIS — E039 Hypothyroidism, unspecified: Secondary | ICD-10-CM | POA: Diagnosis not present

## 2016-10-22 DIAGNOSIS — R29898 Other symptoms and signs involving the musculoskeletal system: Secondary | ICD-10-CM | POA: Diagnosis not present

## 2016-10-22 DIAGNOSIS — R41841 Cognitive communication deficit: Secondary | ICD-10-CM | POA: Diagnosis not present

## 2016-10-22 DIAGNOSIS — F0280 Dementia in other diseases classified elsewhere without behavioral disturbance: Secondary | ICD-10-CM | POA: Diagnosis not present

## 2016-10-22 DIAGNOSIS — M6281 Muscle weakness (generalized): Secondary | ICD-10-CM | POA: Diagnosis not present

## 2016-10-23 ENCOUNTER — Encounter: Payer: Self-pay | Admitting: Internal Medicine

## 2016-10-23 ENCOUNTER — Non-Acute Institutional Stay (SKILLED_NURSING_FACILITY): Payer: Medicare Other | Admitting: Internal Medicine

## 2016-10-23 DIAGNOSIS — G4700 Insomnia, unspecified: Secondary | ICD-10-CM

## 2016-10-23 DIAGNOSIS — I10 Essential (primary) hypertension: Secondary | ICD-10-CM | POA: Diagnosis not present

## 2016-10-23 NOTE — Progress Notes (Signed)
Location:  [facility] Room Number: 2 Place of Service:  SNF (31) Provider:    Blanchie Serve, MD  Patient Care Team: Blanchie Serve, MD as PCP - General (Internal Medicine) Mast, Man X, NP as Nurse Practitioner (Nurse Practitioner) Charlotte Crumb, MD as Consulting Physician (Orthopedic Surgery) Wallene Huh, DPM as Consulting Physician (Podiatry) Gaynelle Arabian, MD as Consulting Physician (Orthopedic Surgery) Zebedee Iba., MD as Referring Physician (Ophthalmology) Newman Pies, MD as Consulting Physician (Neurosurgery) Melina Modena, [facility] (Skilled Nursing and [place])  Extended Emergency Contact Information Primary Emergency Contact: [place] of [place] Phone: [phone] Mobile Phone: [phone] Relation: Daughter Secondary Emergency Contact: [place] of [place] Phone: [phone] Relation: Grandson  Code Status:  DNR  Goals of care: Advanced Directive information Advanced Directives 10/23/2016  Does Patient Have a Medical Advance Directive? Yes  Type of [place] of [place];Living will;Out of facility DNR (pink MOST or yellow form)  Does patient want to make changes to medical advance directive? -  Copy of [place] in Chart? Yes  Pre-existing out of facility DNR order (yellow form or pink MOST form) Yellow form placed in chart (order not valid for inpatient use)     Chief Complaint  Patient presents with  . Acute Visit    insomnia    HPI:  Pt is a 83 y.o. female seen today for an acute visit for follow up on insomnia. She has been on trazodone chronically. She denies any problem falling or staying asleep. Per staff, she rests well at night. She has memory issues but no acute behavior changes. She is HOH.    Past Medical History:  Diagnosis Date  . Arthritis   . Cataracts, bilateral   . Closed fracture of  unspecified part of lower end of humerus   . Dementia   . Hypertension   . Insomnia, unspecified   . Macular degeneration (senile) of retina, unspecified   . Osteoarthritis   . Osteoarthrosis, unspecified whether generalized or localized, unspecified site   . Pancreatitis, acute 08/14/2013  . Retinal neovascularization NOS   . Senile osteoporosis   . Subarachnoid hemorrhage following injury ([place]) 03/31/2012  . Unspecified hearing loss   . Unspecified hypothyroidism   . Urinary tract infection 08/10/2013  . Vertebral fracture 08/09/2013   T7 10%, T12 70%    Past Surgical History:  Procedure Laterality Date  . ABDOMINAL HYSTERECTOMY  1942  . ANKLE FRACTURE SURGERY  1960  . BLADDER SUSPENSION    . CLOSED REDUCTION WITH HUMERAL PIN INSERTION  04/01/2012   Procedure: CLOSED REDUCTION WITH HUMERAL PIN INSERTION;  Surgeon: Schuyler Amor, MD;  [facility];  Service: Orthopedics;  Laterality: Left;    No Known Allergies  Outpatient Encounter Prescriptions as of 10/23/2016  Medication Sig  . acetaminophen (TYLENOL) 500 MG tablet Take 500 mg by mouth every 6 (six) hours as needed. Pain  . amLODipine (NORVASC) 10 MG tablet Take 10 mg by mouth daily.  Marland Kitchen aspirin EC 81 MG tablet Take 81 mg by mouth daily.  . famotidine (PEPCID) 20 MG tablet Take 20 mg by mouth daily as needed for heartburn.   Marland Kitchen ketoconazole (NIZORAL) 2 % shampoo Apply 1 application topically. Shampoo once a week on Wednesday  . levothyroxine (SYNTHROID, LEVOTHROID) 50 MCG tablet Take 50 mcg by mouth daily before breakfast.  . losartan (COZAAR) 100 MG tablet Take 100 mg by mouth daily.  Marland Kitchen  metoprolol (LOPRESSOR) 100 MG tablet Take 100 mg by mouth. Take one tablet twice daily  . olive oil external oil Apply topically. Instill 1 drop in each ear at bedtime  . raloxifene (EVISTA) 60 MG tablet Take 60 mg by mouth daily.  Marland Kitchen senna-docusate (SENOKOT-S) 8.6-50 MG per tablet Take 2 tablets by mouth at bedtime.  . traMADol  (ULTRAM) 50 MG tablet Take 50 mg by mouth every 6 (six) hours as needed.  . traZODone (DESYREL) 50 MG tablet Take 25 mg by mouth at bedtime.    No facility-administered encounter medications on file as of 10/23/2016.     Review of Systems  Constitutional: Negative for fatigue and fever.  HENT: Positive for mouth sores.   Respiratory: Negative for cough and shortness of breath.   Cardiovascular: Negative for chest pain and palpitations.  Neurological: Negative for headaches.  Psychiatric/Behavioral: Negative for behavioral problems.    Immunization History  Administered Date(s) Administered  . Influenza-Unspecified 02/15/2014, 02/16/2015, 02/22/2016  . PPD Test 03/23/2012   Pertinent  Health Maintenance Due  Topic Date Due  . DEXA SCAN  12/29/1983  . PNA vac Low Risk Adult (1 of 2 - PCV13) 12/29/1983  . INFLUENZA VACCINE  12/11/2016   Fall Risk  02/07/2015 02/22/2014 02/22/2014  Falls in the past year? No Yes No  Number falls in past yr: - 1 -  Risk for fall due to : - History of fall(s) -   Functional Status Survey:    Vitals:   10/23/16 1357  BP: (!) 143/63  Pulse: 74  Resp: 14  Temp: 97 F (36.1 C)  SpO2: 96%  Weight: 155 lb 6.4 oz (70.5 kg)  Height: 5' 1.5" (1.562 m)   Body mass index is 28.89 kg/m. Physical Exam  Constitutional: She appears well-developed and well-nourished. No distress.  HENT:  Head: Normocephalic and atraumatic.  Neck: Normal range of motion. Neck supple.  Cardiovascular: Normal rate and regular rhythm.   Pulmonary/Chest: Effort normal and breath sounds normal.  Abdominal: Soft. Bowel sounds are normal.  Musculoskeletal:  Gets around with a walker, edema left > right leg  Neurological:  Alert and oriented to self and place only  Skin: Skin is warm and dry.  Psychiatric: She has a normal mood and affect.    Labs reviewed:  Recent Labs  07/01/16 07/18/16 09/23/16  NA 140 139 138  K 3.9 4.0 4.0  BUN 14 17 15   CREATININE 0.6 0.6  0.6    Recent Labs  07/01/16 09/23/16  AST 15 14  ALT 16 15  ALKPHOS 39 35    Recent Labs  07/01/16 09/23/16  WBC 6.0 5.4  HGB 13.3 11.5*  HCT 40 34*  PLT 194 160   Lab Results  Component Value Date   TSH 3.64 07/01/2016   Lab Results  Component Value Date   HGBA1C 6.7 07/18/2016   No results found for: CHOL, HDL, LDLCALC, LDLDIRECT, TRIG, CHOLHDL  Significant Diagnostic Results in last 30 days:  No results found.  Assessment/Plan  Insomnia No problem reported. Has been resting good. Change her trazodone to daily as needed for 2 weeks and discontinue. If she requires the trazodone as needed frequently, consider melatonin.   HTN Elevated BP this am. On chart review for last month, BP mostly 130 and below. Currently on lopressor 100 mg qd with losartan 100 mg daily and amlodipine 10 mg daily, monitor BP daily x 2 weeks and adjust med if needed.    Family/  staff Communication: reviewed care plan with patient and charge nurse.   Labs/tests ordered:  None  Blanchie Serve, MD Internal Medicine [facility] [address] [place], [place] [address] Cell Phone (Monday-Friday 8 am - 5 pm): [phone] On Call: [phone] and follow prompts after 5 pm and on weekends Office Phone: [phone] Office Fax: [phone]

## 2016-10-24 DIAGNOSIS — E039 Hypothyroidism, unspecified: Secondary | ICD-10-CM | POA: Diagnosis not present

## 2016-10-24 DIAGNOSIS — F0280 Dementia in other diseases classified elsewhere without behavioral disturbance: Secondary | ICD-10-CM | POA: Diagnosis not present

## 2016-10-24 DIAGNOSIS — R41841 Cognitive communication deficit: Secondary | ICD-10-CM | POA: Diagnosis not present

## 2016-10-24 DIAGNOSIS — M6281 Muscle weakness (generalized): Secondary | ICD-10-CM | POA: Diagnosis not present

## 2016-10-24 DIAGNOSIS — R296 Repeated falls: Secondary | ICD-10-CM | POA: Diagnosis not present

## 2016-10-24 DIAGNOSIS — R29898 Other symptoms and signs involving the musculoskeletal system: Secondary | ICD-10-CM | POA: Diagnosis not present

## 2016-10-28 DIAGNOSIS — R29898 Other symptoms and signs involving the musculoskeletal system: Secondary | ICD-10-CM | POA: Diagnosis not present

## 2016-10-28 DIAGNOSIS — M6281 Muscle weakness (generalized): Secondary | ICD-10-CM | POA: Diagnosis not present

## 2016-10-28 DIAGNOSIS — E039 Hypothyroidism, unspecified: Secondary | ICD-10-CM | POA: Diagnosis not present

## 2016-10-28 DIAGNOSIS — F0280 Dementia in other diseases classified elsewhere without behavioral disturbance: Secondary | ICD-10-CM | POA: Diagnosis not present

## 2016-10-28 DIAGNOSIS — R41841 Cognitive communication deficit: Secondary | ICD-10-CM | POA: Diagnosis not present

## 2016-10-28 DIAGNOSIS — R296 Repeated falls: Secondary | ICD-10-CM | POA: Diagnosis not present

## 2016-10-29 DIAGNOSIS — R41841 Cognitive communication deficit: Secondary | ICD-10-CM | POA: Diagnosis not present

## 2016-10-29 DIAGNOSIS — R296 Repeated falls: Secondary | ICD-10-CM | POA: Diagnosis not present

## 2016-10-29 DIAGNOSIS — F0280 Dementia in other diseases classified elsewhere without behavioral disturbance: Secondary | ICD-10-CM | POA: Diagnosis not present

## 2016-10-29 DIAGNOSIS — E039 Hypothyroidism, unspecified: Secondary | ICD-10-CM | POA: Diagnosis not present

## 2016-10-29 DIAGNOSIS — M6281 Muscle weakness (generalized): Secondary | ICD-10-CM | POA: Diagnosis not present

## 2016-10-29 DIAGNOSIS — R29898 Other symptoms and signs involving the musculoskeletal system: Secondary | ICD-10-CM | POA: Diagnosis not present

## 2016-11-01 DIAGNOSIS — M6281 Muscle weakness (generalized): Secondary | ICD-10-CM | POA: Diagnosis not present

## 2016-11-01 DIAGNOSIS — R29898 Other symptoms and signs involving the musculoskeletal system: Secondary | ICD-10-CM | POA: Diagnosis not present

## 2016-11-01 DIAGNOSIS — F0280 Dementia in other diseases classified elsewhere without behavioral disturbance: Secondary | ICD-10-CM | POA: Diagnosis not present

## 2016-11-01 DIAGNOSIS — R296 Repeated falls: Secondary | ICD-10-CM | POA: Diagnosis not present

## 2016-11-01 DIAGNOSIS — R41841 Cognitive communication deficit: Secondary | ICD-10-CM | POA: Diagnosis not present

## 2016-11-01 DIAGNOSIS — E039 Hypothyroidism, unspecified: Secondary | ICD-10-CM | POA: Diagnosis not present

## 2016-11-04 DIAGNOSIS — R296 Repeated falls: Secondary | ICD-10-CM | POA: Diagnosis not present

## 2016-11-04 DIAGNOSIS — R29898 Other symptoms and signs involving the musculoskeletal system: Secondary | ICD-10-CM | POA: Diagnosis not present

## 2016-11-04 DIAGNOSIS — E039 Hypothyroidism, unspecified: Secondary | ICD-10-CM | POA: Diagnosis not present

## 2016-11-04 DIAGNOSIS — F0280 Dementia in other diseases classified elsewhere without behavioral disturbance: Secondary | ICD-10-CM | POA: Diagnosis not present

## 2016-11-04 DIAGNOSIS — R41841 Cognitive communication deficit: Secondary | ICD-10-CM | POA: Diagnosis not present

## 2016-11-04 DIAGNOSIS — M6281 Muscle weakness (generalized): Secondary | ICD-10-CM | POA: Diagnosis not present

## 2016-11-05 DIAGNOSIS — R296 Repeated falls: Secondary | ICD-10-CM | POA: Diagnosis not present

## 2016-11-05 DIAGNOSIS — R29898 Other symptoms and signs involving the musculoskeletal system: Secondary | ICD-10-CM | POA: Diagnosis not present

## 2016-11-05 DIAGNOSIS — R41841 Cognitive communication deficit: Secondary | ICD-10-CM | POA: Diagnosis not present

## 2016-11-05 DIAGNOSIS — M6281 Muscle weakness (generalized): Secondary | ICD-10-CM | POA: Diagnosis not present

## 2016-11-05 DIAGNOSIS — F0280 Dementia in other diseases classified elsewhere without behavioral disturbance: Secondary | ICD-10-CM | POA: Diagnosis not present

## 2016-11-05 DIAGNOSIS — E039 Hypothyroidism, unspecified: Secondary | ICD-10-CM | POA: Diagnosis not present

## 2016-11-07 DIAGNOSIS — E039 Hypothyroidism, unspecified: Secondary | ICD-10-CM | POA: Diagnosis not present

## 2016-11-07 DIAGNOSIS — R296 Repeated falls: Secondary | ICD-10-CM | POA: Diagnosis not present

## 2016-11-07 DIAGNOSIS — R41841 Cognitive communication deficit: Secondary | ICD-10-CM | POA: Diagnosis not present

## 2016-11-07 DIAGNOSIS — R29898 Other symptoms and signs involving the musculoskeletal system: Secondary | ICD-10-CM | POA: Diagnosis not present

## 2016-11-07 DIAGNOSIS — M6281 Muscle weakness (generalized): Secondary | ICD-10-CM | POA: Diagnosis not present

## 2016-11-07 DIAGNOSIS — F0280 Dementia in other diseases classified elsewhere without behavioral disturbance: Secondary | ICD-10-CM | POA: Diagnosis not present

## 2016-11-08 DIAGNOSIS — R29898 Other symptoms and signs involving the musculoskeletal system: Secondary | ICD-10-CM | POA: Diagnosis not present

## 2016-11-08 DIAGNOSIS — E039 Hypothyroidism, unspecified: Secondary | ICD-10-CM | POA: Diagnosis not present

## 2016-11-08 DIAGNOSIS — M6281 Muscle weakness (generalized): Secondary | ICD-10-CM | POA: Diagnosis not present

## 2016-11-08 DIAGNOSIS — F0280 Dementia in other diseases classified elsewhere without behavioral disturbance: Secondary | ICD-10-CM | POA: Diagnosis not present

## 2016-11-08 DIAGNOSIS — R296 Repeated falls: Secondary | ICD-10-CM | POA: Diagnosis not present

## 2016-11-08 DIAGNOSIS — R41841 Cognitive communication deficit: Secondary | ICD-10-CM | POA: Diagnosis not present

## 2016-11-11 DIAGNOSIS — R41841 Cognitive communication deficit: Secondary | ICD-10-CM | POA: Diagnosis not present

## 2016-11-11 DIAGNOSIS — S42409A Unspecified fracture of lower end of unspecified humerus, initial encounter for closed fracture: Secondary | ICD-10-CM | POA: Diagnosis not present

## 2016-11-11 DIAGNOSIS — I609 Nontraumatic subarachnoid hemorrhage, unspecified: Secondary | ICD-10-CM | POA: Diagnosis not present

## 2016-11-11 DIAGNOSIS — H26103 Unspecified traumatic cataract, bilateral: Secondary | ICD-10-CM | POA: Diagnosis not present

## 2016-11-11 DIAGNOSIS — R29898 Other symptoms and signs involving the musculoskeletal system: Secondary | ICD-10-CM | POA: Diagnosis not present

## 2016-11-11 DIAGNOSIS — F0280 Dementia in other diseases classified elsewhere without behavioral disturbance: Secondary | ICD-10-CM | POA: Diagnosis not present

## 2016-11-11 DIAGNOSIS — M810 Age-related osteoporosis without current pathological fracture: Secondary | ICD-10-CM | POA: Diagnosis not present

## 2016-11-11 DIAGNOSIS — I10 Essential (primary) hypertension: Secondary | ICD-10-CM | POA: Diagnosis not present

## 2016-11-11 DIAGNOSIS — W19XXXA Unspecified fall, initial encounter: Secondary | ICD-10-CM | POA: Diagnosis not present

## 2016-11-11 DIAGNOSIS — M6281 Muscle weakness (generalized): Secondary | ICD-10-CM | POA: Diagnosis not present

## 2016-11-11 DIAGNOSIS — M25579 Pain in unspecified ankle and joints of unspecified foot: Secondary | ICD-10-CM | POA: Diagnosis not present

## 2016-11-11 DIAGNOSIS — H9190 Unspecified hearing loss, unspecified ear: Secondary | ICD-10-CM | POA: Diagnosis not present

## 2016-11-11 DIAGNOSIS — M1990 Unspecified osteoarthritis, unspecified site: Secondary | ICD-10-CM | POA: Diagnosis not present

## 2016-11-11 DIAGNOSIS — M545 Low back pain: Secondary | ICD-10-CM | POA: Diagnosis not present

## 2016-11-11 DIAGNOSIS — R296 Repeated falls: Secondary | ICD-10-CM | POA: Diagnosis not present

## 2016-11-11 DIAGNOSIS — R2681 Unsteadiness on feet: Secondary | ICD-10-CM | POA: Diagnosis not present

## 2016-11-11 DIAGNOSIS — H3530 Unspecified macular degeneration: Secondary | ICD-10-CM | POA: Diagnosis not present

## 2016-11-11 DIAGNOSIS — M159 Polyosteoarthritis, unspecified: Secondary | ICD-10-CM | POA: Diagnosis not present

## 2016-11-11 DIAGNOSIS — H35059 Retinal neovascularization, unspecified, unspecified eye: Secondary | ICD-10-CM | POA: Diagnosis not present

## 2016-11-11 DIAGNOSIS — S066X0A Traumatic subarachnoid hemorrhage without loss of consciousness, initial encounter: Secondary | ICD-10-CM | POA: Diagnosis not present

## 2016-11-11 DIAGNOSIS — G4700 Insomnia, unspecified: Secondary | ICD-10-CM | POA: Diagnosis not present

## 2016-11-11 DIAGNOSIS — E039 Hypothyroidism, unspecified: Secondary | ICD-10-CM | POA: Diagnosis not present

## 2016-11-12 DIAGNOSIS — M6281 Muscle weakness (generalized): Secondary | ICD-10-CM | POA: Diagnosis not present

## 2016-11-12 DIAGNOSIS — F0280 Dementia in other diseases classified elsewhere without behavioral disturbance: Secondary | ICD-10-CM | POA: Diagnosis not present

## 2016-11-12 DIAGNOSIS — R41841 Cognitive communication deficit: Secondary | ICD-10-CM | POA: Diagnosis not present

## 2016-11-12 DIAGNOSIS — R29898 Other symptoms and signs involving the musculoskeletal system: Secondary | ICD-10-CM | POA: Diagnosis not present

## 2016-11-12 DIAGNOSIS — E039 Hypothyroidism, unspecified: Secondary | ICD-10-CM | POA: Diagnosis not present

## 2016-11-12 DIAGNOSIS — R296 Repeated falls: Secondary | ICD-10-CM | POA: Diagnosis not present

## 2016-11-25 ENCOUNTER — Non-Acute Institutional Stay (SKILLED_NURSING_FACILITY): Payer: Medicare Other | Admitting: Internal Medicine

## 2016-11-25 ENCOUNTER — Encounter: Payer: Self-pay | Admitting: Internal Medicine

## 2016-11-25 DIAGNOSIS — K219 Gastro-esophageal reflux disease without esophagitis: Secondary | ICD-10-CM

## 2016-11-25 DIAGNOSIS — M159 Polyosteoarthritis, unspecified: Secondary | ICD-10-CM

## 2016-11-25 DIAGNOSIS — R600 Localized edema: Secondary | ICD-10-CM | POA: Diagnosis not present

## 2016-11-25 DIAGNOSIS — Z8673 Personal history of transient ischemic attack (TIA), and cerebral infarction without residual deficits: Secondary | ICD-10-CM | POA: Diagnosis not present

## 2016-11-25 DIAGNOSIS — K5901 Slow transit constipation: Secondary | ICD-10-CM

## 2016-11-25 DIAGNOSIS — R296 Repeated falls: Secondary | ICD-10-CM | POA: Diagnosis not present

## 2016-11-25 DIAGNOSIS — M150 Primary generalized (osteo)arthritis: Secondary | ICD-10-CM

## 2016-11-25 NOTE — Progress Notes (Signed)
Location:  [facility] Room Number: N-2 Place of Service:  SNF (31) Provider:  Blanchie Serve MD  Blanchie Serve, MD  Patient Care Team: Blanchie Serve, MD as PCP - General (Internal Medicine) Mast, Man X, NP as Nurse Practitioner (Nurse Practitioner) Charlotte Crumb, MD as Consulting Physician (Orthopedic Surgery) Wallene Huh, DPM as Consulting Physician (Podiatry) Gaynelle Arabian, MD as Consulting Physician (Orthopedic Surgery) Zebedee Iba., MD as Referring Physician (Ophthalmology) Newman Pies, MD as Consulting Physician (Neurosurgery) Melina Modena, [facility] (Skilled Nursing and [place])  Extended Emergency Contact Information Primary Emergency Contact: [place] of [place] Phone: [phone] Mobile Phone: [phone] Relation: Daughter Secondary Emergency Contact: [place] of [place] Phone: [phone] Relation: Grandson  Code Status:  DNR Goals of care: Advanced Directive information Advanced Directives 10/23/2016  Does Patient Have a Medical Advance Directive? Yes  Type of Paramedic of [place];Living will;Out of facility DNR (pink MOST or yellow form)  Does patient want to make changes to medical advance directive? -  Copy of [place] in Chart? Yes  Pre-existing out of facility DNR order (yellow form or pink MOST form) Yellow form placed in chart (order not valid for inpatient use)     Chief Complaint  Patient presents with  . Medical Management of Chronic Issues    Routine Visit     HPI:  Pt is a 83 y.o. female seen today for medical management of chronic diseases.  She is seen in her room today. Per nursing she has been at her baseline. She is a high fall risk. She does have agitation at times but re-direction helps. She feeds herself and is out of bed daily. She gets around with her walker. She denies any  concern.   Past Medical History:  Diagnosis Date  . Arthritis   . Cataracts, bilateral   . Closed fracture of unspecified part of lower end of humerus   . Dementia   . Hypertension   . Insomnia, unspecified   . Macular degeneration (senile) of retina, unspecified   . Osteoarthritis   . Osteoarthrosis, unspecified whether generalized or localized, unspecified site   . Pancreatitis, acute 08/14/2013  . Retinal neovascularization NOS   . Senile osteoporosis   . Subarachnoid hemorrhage following injury ([place]) 03/31/2012  . Unspecified hearing loss   . Unspecified hypothyroidism   . Urinary tract infection 08/10/2013  . Vertebral fracture 08/09/2013   T7 10%, T12 70%    Past Surgical History:  Procedure Laterality Date  . ABDOMINAL HYSTERECTOMY  1942  . ANKLE FRACTURE SURGERY  1960  . BLADDER SUSPENSION    . CLOSED REDUCTION WITH HUMERAL PIN INSERTION  04/01/2012   Procedure: CLOSED REDUCTION WITH HUMERAL PIN INSERTION;  Surgeon: Schuyler Amor, MD;  Location: [facility];  Service: Orthopedics;  Laterality: Left;    No Known Allergies  Outpatient Encounter Prescriptions as of 11/25/2016  Medication Sig  . acetaminophen (TYLENOL) 500 MG tablet Take 500 mg by mouth every 6 (six) hours as needed. Pain  . amLODipine (NORVASC) 10 MG tablet Take 10 mg by mouth daily.  Marland Kitchen aspirin EC 81 MG tablet Take 81 mg by mouth daily.  . famotidine (PEPCID) 20 MG tablet Take 20 mg by mouth daily as needed for heartburn.   Marland Kitchen ketoconazole (NIZORAL) 2 % shampoo Apply 1 application topically. Shampoo once a week on Wednesday  . levothyroxine (SYNTHROID, LEVOTHROID) 50 MCG tablet Take 50  mcg by mouth daily before breakfast.  . LORazepam (ATIVAN) 1 MG tablet Take 1 mg by mouth as needed for anxiety (Give 1 hour prior to dental appointment).  . losartan (COZAAR) 100 MG tablet Take 100 mg by mouth daily.  . metoprolol (LOPRESSOR) 100 MG tablet Take 100 mg by mouth 2 (two) times daily.   Marland Kitchen olive oil external  oil Apply topically. Instill 1 drop in each ear at bedtime  . raloxifene (EVISTA) 60 MG tablet Take 60 mg by mouth daily.  Marland Kitchen senna-docusate (SENOKOT-S) 8.6-50 MG per tablet Take 2 tablets by mouth at bedtime.  . traMADol (ULTRAM) 50 MG tablet Take 50 mg by mouth every 6 (six) hours as needed.  . [DISCONTINUED] traZODone (DESYREL) 50 MG tablet Take 25 mg by mouth at bedtime.    No facility-administered encounter medications on file as of 11/25/2016.     Review of Systems  Constitutional: Negative for activity change, appetite change, chills, diaphoresis and fever.  HENT: Positive for hearing loss. Negative for congestion, mouth sores, sore throat and trouble swallowing.        She feeds herself and eats regular consistency food, takes her medications crushed in ice cream  Respiratory: Negative for cough and shortness of breath.   Cardiovascular: Negative for chest pain, palpitations and leg swelling.  Gastrointestinal: Negative for abdominal pain, constipation, nausea and vomiting.  Genitourinary: Negative for dysuria.       Has urinary incontinence  Musculoskeletal: Positive for gait problem. Negative for back pain.       Uses her walker to get around and needs minimum assistance with transfer  Skin: Negative for rash.  Neurological: Negative for tremors and syncope.  Psychiatric/Behavioral: Positive for confusion. Negative for behavioral problems.       Has dementia    Immunization History  Administered Date(s) Administered  . Influenza-Unspecified 02/15/2014, 02/16/2015, 02/22/2016  . PPD Test 03/23/2012   Pertinent  Health Maintenance Due  Topic Date Due  . DEXA SCAN  05/13/2017 (Originally 12/29/1981)  . PNA vac Low Risk Adult (1 of 2 - PCV13) 05/13/2017 (Originally [birth year]1983)  . INFLUENZA VACCINE  12/11/2016   Fall Risk  02/07/2015 02/22/2014 02/22/2014  Falls in the past year? No Yes No  Number falls in past yr: - 1 -  Risk for fall due to : - History of fall(s) -    Functional Status Survey:    Vitals:   11/25/16 1012  BP: (!) 141/71  Pulse: 62  Resp: 20  Temp: 97.9 F (36.6 C)  TempSrc: Oral  SpO2: 96%  Weight: 155 lb 4.8 oz (70.4 kg)  Height: 5' 1.5" (1.562 m)   Body mass index is 28.87 kg/m. Physical Exam  Constitutional: She appears well-developed and well-nourished. No distress.  HENT:  Head: Normocephalic and atraumatic.  Mouth/Throat: Oropharynx is clear and moist.  Eyes: Pupils are equal, round, and reactive to light. Conjunctivae are normal.  Neck: Normal range of motion. Neck supple. No thyromegaly present.  Cardiovascular: Normal rate and regular rhythm.   Pulmonary/Chest: Effort normal and breath sounds normal. She has no wheezes. She has no rales.  Abdominal: Soft. Bowel sounds are normal. She exhibits no distension. There is no tenderness. There is no rebound and no guarding.  Musculoskeletal:  Can move all 4 extremities, uses a walker to get around, unsteady gait, trace right leg and 1+ left leg edema, limited ROM to both her shoulders  Lymphadenopathy:    She has no cervical adenopathy.  Neurological:  She is alert.  Oriented to self only  Skin: Skin is warm and dry. She is not diaphoretic.  Psychiatric: She has a normal mood and affect.  pleasantly confused     Labs reviewed:  Recent Labs  07/01/16 07/18/16 09/23/16  NA 140 139 138  K 3.9 4.0 4.0  BUN 14 17 15   CREATININE 0.6 0.6 0.6    Recent Labs  07/01/16 09/23/16  AST 15 14  ALT 16 15  ALKPHOS 39 35    Recent Labs  07/01/16 09/23/16  WBC 6.0 5.4  HGB 13.3 11.5*  HCT 40 34*  PLT 194 160   Lab Results  Component Value Date   TSH 3.64 07/01/2016   Lab Results  Component Value Date   HGBA1C 6.7 07/18/2016   No results found for: CHOL, HDL, LDLCALC, LDLDIRECT, TRIG, CHOLHDL  Significant Diagnostic Results in last 30 days:  No results found.  Assessment/Plan  gerd No symptom reported to nursing, d/c famotidine prn  order  Constipation Regular BM per nursing, continue senokot s at bedtime  History of CVA Continue baby aspirin and antihypertensives  History of falls Multiple falls in fast with few with lacerations requiring ED visit. Fall precautions. Assist with transfers and remind her to uses her walker. Poor safety awareness. Has worked with PT/OT.   OA Controlled. D/c tramadol order. Continue tylenol 500 mg q6h prn pain  Chronic leg edema Wt Readings from Last 3 Encounters:  11/25/16 155 lb 4.8 oz (70.4 kg)  10/23/16 155 lb 6.4 oz (70.5 kg)  10/15/16 156 lb (70.8 kg)   Weight appears stable. Skin dry and intact. Dependent edema left > right. To keep legs elevated at rest. Clear lungs. Monitor clinically. Provide skin care.    Family/ staff Communication: reviewed care plan with patient and charge nurse.    Labs/tests ordered:  none   Blanchie Serve, MD Internal Medicine [facility] [address] [place],  [address] Cell Phone (Monday-Friday 8 am - 5 pm): [phone] On Call: [phone] and follow prompts after 5 pm and on weekends Office Phone: [phone] Office Fax: [phone]

## 2016-12-13 ENCOUNTER — Non-Acute Institutional Stay (SKILLED_NURSING_FACILITY): Payer: Medicare Other

## 2016-12-13 DIAGNOSIS — Z0000 Encounter for general adult medical examination without abnormal findings: Secondary | ICD-10-CM

## 2016-12-13 NOTE — Progress Notes (Signed)
Subjective:   Melissa Carroll is a 83 y.o. female who presents for Medicare Annual (Subsequent) preventive examination at [facility]; incapacitated patient unable to answer questions appropriately   Last AWV-01/10/10    Objective:     Vitals: BP 110/70 (BP Location: Left Arm, Patient Position: Sitting)   Pulse 67   Temp (!) 97.5 F (36.4 C) (Axillary)   Ht 5\' 2"  (1.575 m)   Wt 155 lb (70.3 kg)   SpO2 97%   BMI 28.35 kg/m   Body mass index is 28.35 kg/m.   Tobacco History  Smoking Status  . Never Smoker  Smokeless Tobacco  . Never Used     Counseling given: Not Answered   Past Medical History:  Diagnosis Date  . Arthritis   . Cataracts, bilateral   . Closed fracture of unspecified part of lower end of humerus   . Dementia   . Hypertension   . Insomnia, unspecified   . Macular degeneration (senile) of retina, unspecified   . Osteoarthritis   . Osteoarthrosis, unspecified whether generalized or localized, unspecified site   . Pancreatitis, acute 08/14/2013  . Retinal neovascularization NOS   . Senile osteoporosis   . Subarachnoid hemorrhage following injury ([place]) 03/31/2012  . Unspecified hearing loss   . Unspecified hypothyroidism   . Urinary tract infection 08/10/2013  . Vertebral fracture 08/09/2013   T7 10%, T12 70%    Past Surgical History:  Procedure Laterality Date  . ABDOMINAL HYSTERECTOMY  1942  . ANKLE FRACTURE SURGERY  1960  . BLADDER SUSPENSION    . CLOSED REDUCTION WITH HUMERAL PIN INSERTION  04/01/2012   Procedure: CLOSED REDUCTION WITH HUMERAL PIN INSERTION;  Surgeon: Schuyler Amor, MD;  Location: [facility];  Service: Orthopedics;  Laterality: Left;   Family History  Problem Relation Age of Onset  . Hypertension Mother    History  Sexual Activity  . Sexual activity: No    Outpatient Encounter Prescriptions as of 12/13/2016  Medication Sig  . acetaminophen (TYLENOL) 500 MG tablet Take 500 mg by mouth every 6 (six)  hours as needed. Pain  . amLODipine (NORVASC) 10 MG tablet Take 10 mg by mouth daily.  Marland Kitchen aspirin EC 81 MG tablet Take 81 mg by mouth daily.  . famotidine (PEPCID) 20 MG tablet Take 20 mg by mouth daily as needed for heartburn.   Marland Kitchen ketoconazole (NIZORAL) 2 % shampoo Apply 1 application topically. Shampoo once a week on Wednesday  . levothyroxine (SYNTHROID, LEVOTHROID) 50 MCG tablet Take 50 mcg by mouth daily before breakfast.  . LORazepam (ATIVAN) 1 MG tablet Take 1 mg by mouth as needed for anxiety (Give 1 hour prior to dental appointment).  . losartan (COZAAR) 100 MG tablet Take 100 mg by mouth daily.  . metoprolol (LOPRESSOR) 100 MG tablet Take 100 mg by mouth 2 (two) times daily.   Marland Kitchen olive oil external oil Apply topically. Instill 1 drop in each ear at bedtime  . raloxifene (EVISTA) 60 MG tablet Take 60 mg by mouth daily.  Marland Kitchen senna-docusate (SENOKOT-S) 8.6-50 MG per tablet Take 2 tablets by mouth at bedtime.   No facility-administered encounter medications on file as of 12/13/2016.     Activities of Daily Living In your present state of health, do you have any difficulty performing the following activities: 12/13/2016  Hearing? Y  Vision? Y  Difficulty concentrating or making decisions? Y  Walking or climbing stairs? Y  Dressing or bathing? [name]  Doing errands, shopping? Y  Preparing Food and eating ? Y  Using the Toilet? Y  In the past six months, have you accidently leaked urine? Y  Do you have problems with loss of bowel control? Y  Managing your Medications? Y  Managing your Finances? Y  Housekeeping or managing your Housekeeping? Y  Some recent data might be hidden    Patient Care Team: Blanchie Serve, MD as PCP - General (Internal Medicine) Mast, Man X, NP as Nurse Practitioner (Nurse Practitioner) Charlotte Crumb, MD as Consulting Physician (Orthopedic Surgery) Regal, Tamala Fothergill, DPM as Consulting Physician (Podiatry) Gaynelle Arabian, MD as Consulting Physician (Orthopedic  Surgery) Zebedee Iba., MD as Referring Physician (Ophthalmology) Newman Pies, MD as Consulting Physician (Neurosurgery) Melina Modena, [facility] (Skilled Nursing and [place])    Assessment:     Exercise Activities and Dietary recommendations Current Exercise Habits: The patient does not participate in regular exercise at present, Exercise limited by: neurologic condition(s)  Goals    None     Fall Risk Fall Risk  12/13/2016 02/07/2015 02/22/2014 02/22/2014  Falls in the past year? No No Yes No  Number falls in past yr: - - 1 -  Risk for fall due to : - - History of fall(s) -   Depression Screen PHQ 2/9 Scores 12/13/2016 02/22/2014 02/22/2014  PHQ - 2 Score - 0 0  Exception Documentation Medical reason - -     Cognitive Function MMSE - Mini Mental State Exam 12/13/2016  Not completed: Unable to complete        Immunization History  Administered Date(s) Administered  . Influenza-Unspecified 02/15/2014, 02/16/2015, 02/22/2016  . PPD Test 03/23/2012   Screening Tests Health Maintenance  Topic Date Due  . INFLUENZA VACCINE  12/11/2016  . DEXA SCAN  05/13/2017 (Originally [birth year]/1983)  . PNA vac Low Risk Adult (1 of 2 - PCV13) 05/13/2017 (Originally [birth year]/1983)  . TETANUS/TDAP  10/24/2023 (Originally 12/28/1935)      Plan:    I have personally reviewed and addressed the Medicare Annual Wellness questionnaire and have noted the following in the patient's chart:  A. Medical and social history B. Use of alcohol, tobacco or illicit drugs  C. Current medications and supplements D. Functional ability and status E.  Nutritional status F.  Physical activity G. Advance directives H. List of other physicians I.  Hospitalizations, surgeries, and ER visits in previous 12 months J.  [place] to include hearing, vision, cognitive, depression L. Referrals and appointments - none  In addition, I am unable to review and discuss with incapacitated patient  certain preventive protocols, quality metrics, and best practice recommendations. A written personalized care plan for preventive services as well as general preventive health recommendations were provided to patient.   See attached scanned questionnaire for additional information.   Signed,   Rich Reining, RN Nurse Health Advisor   Quick Notes   Health Maintenance: DEXA due, not ordered pt not ambulatory. PNA 13 and TDAP due     Abnormal Screen: Unable to compete mental exam     Patient Concerns: None     Nurse Concerns: None

## 2016-12-13 NOTE — Patient Instructions (Signed)
Melissa Carroll , Thank you for taking time to come for your Medicare Wellness Visit. I appreciate your ongoing commitment to your health goals. Please review the following plan we discussed and let me know if I can assist you in the future.   Screening recommendations/referrals: Colonoscopy excluded, pt over age 81 Mammogram excluded,pt over age 12 Bone Density due, excluded pt not ambulatory Recommended yearly ophthalmology/optometry visit for glaucoma screening and checkup Recommended yearly dental visit for hygiene and checkup  Vaccinations: Influenza vaccine due 2018 fall season Pneumococcal vaccine 13 due Tdap vaccine due Shingles vaccine not in reocrds  Advanced directives: DNR in chart, copies of health care power of attorney and living will are needed   Conditions/risks identified: None  Next appointment: Dr. Bubba Camp makes rounds   Preventive Care 47 Years and Older, Female Preventive care refers to lifestyle choices and visits with your health care provider that can promote health and wellness. What does preventive care include?  A yearly physical exam. This is also called an annual well check.  Dental exams once or twice a year.  Routine eye exams. Ask your health care provider how often you should have your eyes checked.  Personal lifestyle choices, including:  Daily care of your teeth and gums.  Regular physical activity.  Eating a healthy diet.  Avoiding tobacco and drug use.  Limiting alcohol use.  Practicing safe sex.  Taking low-dose aspirin every day.  Taking vitamin and mineral supplements as recommended by your health care provider. What happens during an annual well check? The services and screenings done by your health care provider during your annual well check will depend on your age, overall health, lifestyle risk factors, and family history of disease. Counseling  Your health care provider may ask you questions about your:  Alcohol  use.  Tobacco use.  Drug use.  Emotional well-being.  Home and relationship well-being.  Sexual activity.  Eating habits.  History of falls.  Memory and ability to understand (cognition).  Work and work Statistician.  Reproductive health. Screening  You may have the following tests or measurements:  Height, weight, and BMI.  Blood pressure.  Lipid and cholesterol levels. These may be checked every 5 years, or more frequently if you are over 74 years old.  Skin check.  Lung cancer screening. You may have this screening every year starting at age 80 if you have a 30-pack-year history of smoking and currently smoke or have quit within the past 15 years.  Fecal occult blood test (FOBT) of the stool. You may have this test every year starting at age 35.  Flexible sigmoidoscopy or colonoscopy. You may have a sigmoidoscopy every 5 years or a colonoscopy every 10 years starting at age 38.  Hepatitis C blood test.  Hepatitis B blood test.  Sexually transmitted disease (STD) testing.  Diabetes screening. This is done by checking your blood sugar (glucose) after you have not eaten for a while (fasting). You may have this done every 1-3 years.  Bone density scan. This is done to screen for osteoporosis. You may have this done starting at age 48.  Mammogram. This may be done every 1-2 years. Talk to your health care provider about how often you should have regular mammograms. Talk with your health care provider about your test results, treatment options, and if necessary, the need for more tests. Vaccines  Your health care provider may recommend certain vaccines, such as:  Influenza vaccine. This is recommended every year.  Tetanus,  diphtheria, and acellular pertussis (Tdap, Td) vaccine. You may need a Td booster every 10 years.  Zoster vaccine. You may need this after age 37.  Pneumococcal 13-valent conjugate (PCV13) vaccine. One dose is recommended after age  61.  Pneumococcal polysaccharide (PPSV23) vaccine. One dose is recommended after age 79. Talk to your health care provider about which screenings and vaccines you need and how often you need them. This information is not intended to replace advice given to you by your health care provider. Make sure you discuss any questions you have with your health care provider. Document Released: 05/26/2015 Document Revised: 01/17/2016 Document Reviewed: 02/28/2015 Elsevier Interactive Patient Education  2017 [place] Prevention in the Home Falls can cause injuries. They can happen to people of all ages. There are many things you can do to make your home safe and to help prevent falls. What can I do on the outside of my home?  Regularly fix the edges of walkways and driveways and fix any cracks.  Remove anything that might make you trip as you walk through a door, such as a raised step or threshold.  Trim any bushes or trees on the path to your home.  Use bright outdoor lighting.  Clear any walking paths of anything that might make someone trip, such as rocks or tools.  Regularly check to see if handrails are loose or broken. Make sure that both sides of any steps have handrails.  Any raised decks and porches should have guardrails on the edges.  Have any leaves, snow, or ice cleared regularly.  Use sand or salt on walking paths during winter.  Clean up any spills in your garage right away. This includes oil or grease spills. What can I do in the bathroom?  Use night lights.  Install grab bars by the toilet and in the tub and shower. Do not use towel bars as grab bars.  Use non-skid mats or decals in the tub or shower.  If you need to sit down in the shower, use a plastic, non-slip stool.  Keep the floor dry. Clean up any water that spills on the floor as soon as it happens.  Remove soap buildup in the tub or shower regularly.  Attach bath mats securely with double-sided  non-slip rug tape.  Do not have throw rugs and other things on the floor that can make you trip. What can I do in the bedroom?  Use night lights.  Make sure that you have a light by your bed that is easy to reach.  Do not use any sheets or blankets that are too big for your bed. They should not hang down onto the floor.  Have a firm chair that has side arms. You can use this for support while you get dressed.  Do not have throw rugs and other things on the floor that can make you trip. What can I do in the kitchen?  Clean up any spills right away.  Avoid walking on wet floors.  Keep items that you use a lot in easy-to-reach places.  If you need to reach something above you, use a strong step stool that has a grab bar.  Keep electrical cords out of the way.  Do not use floor polish or wax that makes floors slippery. If you must use wax, use non-skid floor wax.  Do not have throw rugs and other things on the floor that can make you trip. What can I do with  my stairs?  Do not leave any items on the stairs.  Make sure that there are handrails on both sides of the stairs and use them. Fix handrails that are broken or loose. Make sure that handrails are as long as the stairways.  Check any carpeting to make sure that it is firmly attached to the stairs. Fix any carpet that is loose or worn.  Avoid having throw rugs at the top or bottom of the stairs. If you do have throw rugs, attach them to the floor with carpet tape.  Make sure that you have a light switch at the top of the stairs and the bottom of the stairs. If you do not have them, ask someone to add them for you. What else can I do to help prevent falls?  Wear shoes that:  Do not have high heels.  Have rubber bottoms.  Are comfortable and fit you well.  Are closed at the toe. Do not wear sandals.  If you use a stepladder:  Make sure that it is fully opened. Do not climb a closed stepladder.  Make sure that both  sides of the stepladder are locked into place.  Ask someone to hold it for you, if possible.  Clearly mark and make sure that you can see:  Any grab bars or handrails.  First and last steps.  Where the edge of each step is.  Use tools that help you move around (mobility aids) if they are needed. These include:  Canes.  Walkers.  Scooters.  Crutches.  Turn on the lights when you go into a dark area. Replace any light bulbs as soon as they burn out.  Set up your furniture so you have a clear path. Avoid moving your furniture around.  If any of your floors are uneven, fix them.  If there are any pets around you, be aware of where they are.  Review your medicines with your doctor. Some medicines can make you feel dizzy. This can increase your chance of falling. Ask your doctor what other things that you can do to help prevent falls. This information is not intended to replace advice given to you by your health care provider. Make sure you discuss any questions you have with your health care provider. Document Released: 02/23/2009 Document Revised: 10/05/2015 Document Reviewed: 06/03/2014 Elsevier Interactive Patient Education  2017 [employer].

## 2016-12-20 ENCOUNTER — Encounter: Payer: Self-pay | Admitting: Family

## 2016-12-20 ENCOUNTER — Non-Acute Institutional Stay (SKILLED_NURSING_FACILITY): Payer: Medicare Other | Admitting: Family

## 2016-12-20 DIAGNOSIS — B379 Candidiasis, unspecified: Secondary | ICD-10-CM

## 2016-12-20 MED ORDER — NYSTATIN 100000 UNIT/GM EX CREA: TOPICAL_CREAM | Freq: Two times a day (BID) | CUTANEOUS | Status: AC

## 2016-12-20 MED ORDER — NYSTATIN 100000 UNIT/GM EX CREA: TOPICAL_CREAM | Freq: Two times a day (BID) | CUTANEOUS | Status: DC

## 2016-12-20 NOTE — Progress Notes (Signed)
Location:  [facility] Room Number: 2 Place of Service:  SNF (31) Provider: Huxton Glaus FNP-C  Blanchie Serve, MD  Patient Care Team: Blanchie Serve, MD as PCP - General (Internal Medicine) Mast, Man X, NP as Nurse Practitioner (Nurse Practitioner) Charlotte Crumb, MD as Consulting Physician (Orthopedic Surgery) Wallene Huh, DPM as Consulting Physician (Podiatry) Gaynelle Arabian, MD as Consulting Physician (Orthopedic Surgery) Zebedee Iba., MD as Referring Physician (Ophthalmology) Newman Pies, MD as Consulting Physician (Neurosurgery) Melina Modena, [facility] (Skilled Nursing and [place])  Extended Emergency Contact Information Primary Emergency Contact: [place] of [place] Phone: [phone] Mobile Phone: [phone] Relation: Daughter Secondary Emergency Contact: [place] of [place] Phone: [phone] Relation: Grandson  Code Status:  DNR  Goals of care: Advanced Directive information Advanced Directives 12/20/2016  Does Patient Have a Medical Advance Directive? Yes  Type of Advance Directive Out of facility DNR (pink MOST or yellow form);Living will;Healthcare Power of Attorney  Does patient want to make changes to medical advance directive? -  Copy of [place] in Chart? Yes  Pre-existing out of facility DNR order (yellow form or pink MOST form) -     Chief Complaint  Patient presents with  . Acute Visit    rash on buttocks    HPI:  Pt is a 83 y.o. female seen today at [facility] for an acute visit for evaluation of " rash on the buttocks". She is seen in her room today per facility Nurse request.She denies any fever, chills, pain or itching to site. She does not know how long she has had the rash.   Past Medical History:  Diagnosis Date  . Arthritis   . Cataracts, bilateral   . Closed fracture of unspecified part of lower end of humerus    . Dementia   . Hypertension   . Insomnia, unspecified   . Macular degeneration (senile) of retina, unspecified   . Osteoarthritis   . Osteoarthrosis, unspecified whether generalized or localized, unspecified site   . Pancreatitis, acute 08/14/2013  . Retinal neovascularization NOS   . Senile osteoporosis   . Subarachnoid hemorrhage following injury ([place]) 03/31/2012  . Unspecified hearing loss   . Unspecified hypothyroidism   . Urinary tract infection 08/10/2013  . Vertebral fracture 08/09/2013   T7 10%, T12 70%    Past Surgical History:  Procedure Laterality Date  . ABDOMINAL HYSTERECTOMY  1942  . ANKLE FRACTURE SURGERY  1960  . BLADDER SUSPENSION    . CLOSED REDUCTION WITH HUMERAL PIN INSERTION  04/01/2012   Procedure: CLOSED REDUCTION WITH HUMERAL PIN INSERTION;  Surgeon: Schuyler Amor, MD;  Location: [facility]S;  Service: Orthopedics;  Laterality: Left;    No Known Allergies  Allergies as of 12/20/2016   No Known Allergies     Medication List       Accurate as of 12/20/16  3:44 PM. Always use your most recent med list.          acetaminophen 500 MG tablet Commonly known as:  TYLENOL Take 500 mg by mouth every 6 (six) hours as needed. Pain   amLODipine 10 MG tablet Commonly known as:  NORVASC Take 10 mg by mouth daily.   aspirin EC 81 MG tablet Take 81 mg by mouth daily.   ketoconazole 2 % shampoo Commonly known as:  NIZORAL Apply 1 application topically. Shampoo once a week on Wednesday   levothyroxine 50 MCG  tablet Commonly known as:  SYNTHROID, LEVOTHROID Take 50 mcg by mouth daily before breakfast.   LORazepam 1 MG tablet Commonly known as:  ATIVAN Take 1 mg by mouth as needed for anxiety (Give 1 hour prior to dental appointment).   losartan 100 MG tablet Commonly known as:  COZAAR Take 100 mg by mouth daily.   metoprolol tartrate 100 MG tablet Commonly known as:  LOPRESSOR Take 100 mg by mouth 2 (two) times daily.   olive oil external  oil Apply topically. Instill 1 drop in each ear at bedtime   raloxifene 60 MG tablet Commonly known as:  EVISTA Take 60 mg by mouth daily.   senna-docusate 8.6-50 MG tablet Commonly known as:  Senokot-S Take 2 tablets by mouth at bedtime.       Review of Systems  Constitutional: Negative for activity change, appetite change, chills, fatigue and fever.  Respiratory: Negative for cough, chest tightness, shortness of breath and wheezing.   Cardiovascular: Negative for chest pain, palpitations and leg swelling.  Gastrointestinal: Negative for abdominal distention, abdominal pain, constipation, diarrhea, nausea and vomiting.  Musculoskeletal: Positive for gait problem.  Skin: Positive for rash. Negative for color change and pallor.       Rash on left gluteal and between gluteal folds   Psychiatric/Behavioral: Positive for confusion. Negative for agitation, hallucinations and sleep disturbance. The patient is not nervous/anxious.     Immunization History  Administered Date(s) Administered  . Influenza-Unspecified 03/02/2013, 02/15/2014, 02/16/2015, 02/22/2016  . PPD Test 03/23/2012  . Pneumococcal Polysaccharide-23 09/22/2002   Pertinent  Health Maintenance Due  Topic Date Due  . INFLUENZA VACCINE  12/11/2016  . DEXA SCAN  05/13/2017 (Originally 12/29/1983)  . PNA vac Low Risk Adult (2 of 2 - PCV13) 05/13/2017 (Originally 09/22/2003)   Fall Risk  12/13/2016 02/07/2015 02/22/2014 02/22/2014  Falls in the past year? No No Yes No  Number falls in past yr: - - 1 -  Risk for fall due to : - - History of fall(s) -    Vitals:   12/20/16 1116  BP: (!) 148/72  Pulse: 96  Resp: 20  Temp: 97.6 F (36.4 C)  SpO2: 96%  Weight: 151 lb 14.4 oz (68.9 kg)  Height: 5\' 2"  (1.575 m)   Body mass index is 27.78 kg/m. Physical Exam  Constitutional:  Frail elderly in no acute distress   HENT:  Head: Normocephalic.  Mouth/Throat: Oropharynx is clear and moist. No oropharyngeal exudate.   Eyes: Pupils are equal, round, and reactive to light. Conjunctivae and EOM are normal. Right eye exhibits no discharge. Left eye exhibits no discharge. No scleral icterus.  Neck: Normal range of motion. No JVD present. No thyromegaly present.  Cardiovascular: Normal rate, regular rhythm, normal heart sounds and intact distal pulses.  Exam reveals no gallop and no friction rub.   No murmur heard. Pulmonary/Chest: Effort normal and breath sounds normal. No respiratory distress. She has no wheezes. She has no rales.  Abdominal: Soft. Bowel sounds are normal. She exhibits no distension. There is no tenderness. There is no rebound and no guarding.  Musculoskeletal: She exhibits no edema, tenderness or deformity.  Unsteady gait  Lymphadenopathy:    She has no cervical adenopathy.  Neurological: She is alert.  Skin: Skin is warm and dry. No erythema. No pallor.   pale redness noted on Left gluteal and right gluteal skin folds. No drainage or tenderness.   Psychiatric: She has a normal mood and affect.    Labs  reviewed:  Recent Labs  07/01/16 07/18/16 09/23/16  NA 140 139 138  K 3.9 4.0 4.0  BUN 14 17 15   CREATININE 0.6 0.6 0.6    Recent Labs  07/01/16 09/23/16  AST 15 14  ALT 16 15  ALKPHOS 39 35    Recent Labs  07/01/16 09/23/16  WBC 6.0 5.4  HGB 13.3 11.5*  HCT 40 34*  PLT 194 160   Lab Results  Component Value Date   TSH 3.64 07/01/2016   Lab Results  Component Value Date   HGBA1C 6.7 07/18/2016   Significant Diagnostic Results in last 30 days:  No results found.  Assessment/Plan  Candidiasis Afebrile.Pale red large patchy area noted on left gluteal and right gluteal skin fold.No tenderness or drainage noted. Start on Nystatin 100,000 units topical cream apply one application to affected areas twice daily X 14 days. Continue to monitor.    Family/ staff Communication: Reviewed plan of care with patient and facility Nurse.   Labs/tests ordered: None   Marceil Welp C  Izeah Vossler, NP

## 2016-12-25 ENCOUNTER — Non-Acute Institutional Stay (SKILLED_NURSING_FACILITY): Payer: Medicare Other | Admitting: Family

## 2016-12-25 DIAGNOSIS — E039 Hypothyroidism, unspecified: Secondary | ICD-10-CM | POA: Diagnosis not present

## 2016-12-25 DIAGNOSIS — K219 Gastro-esophageal reflux disease without esophagitis: Secondary | ICD-10-CM

## 2016-12-25 DIAGNOSIS — I10 Essential (primary) hypertension: Secondary | ICD-10-CM

## 2016-12-25 DIAGNOSIS — K5901 Slow transit constipation: Secondary | ICD-10-CM | POA: Diagnosis not present

## 2016-12-25 DIAGNOSIS — R634 Abnormal weight loss: Secondary | ICD-10-CM | POA: Diagnosis not present

## 2016-12-25 NOTE — Progress Notes (Signed)
Location:  [facility] Room Number: 2 Place of Service:  SNF (31) Provider: Mackenze Grandison FNP-C   Blanchie Serve, MD  Patient Care Team: Blanchie Serve, MD as PCP - General (Internal Medicine) Mast, Man X, NP as Nurse Practitioner (Nurse Practitioner) Charlotte Crumb, MD as Consulting Physician (Orthopedic Surgery) Wallene Huh, DPM as Consulting Physician (Podiatry) Gaynelle Arabian, MD as Consulting Physician (Orthopedic Surgery) Zebedee Iba., MD as Referring Physician (Ophthalmology) Newman Pies, MD as Consulting Physician (Neurosurgery) Melina Modena, [facility] (Skilled Nursing and [place])  Extended Emergency Contact Information Primary Emergency Contact: [place] of [place] Phone: [phone] Mobile Phone: [phone] Relation: Daughter Secondary Emergency Contact: [place] of [place] Phone: [phone] Relation: Grandson  Code Status:  DNR Goals of care: Advanced Directive information Advanced Directives 12/20/2016  Does Patient Have a Medical Advance Directive? Yes  Type of Advance Directive Out of facility DNR (pink MOST or yellow form);Living will;Healthcare Power of Attorney  Does patient want to make changes to medical advance directive? -  Copy of [place] in Chart? Yes  Pre-existing out of facility DNR order (yellow form or pink MOST form) -     Chief Complaint  Patient presents with  . Medical Management of Chronic Issues    HPI:  Pt is a 83 y.o. female seen today [place] for medical management of chronic diseases.She has a medical history of HTN, Hypothyroidism, GERD,CVA, OA among others. She is seen in her room today with Nurse present. She denies any acute issues this visit.Facility Nurse reports no new concerns. Her recent gluteal candidiasis has improved with current treatment.No recent fall episodes reported. She has had a 3.4  pounds weight loss over the past one month. She continues to follow up with facility dietician.      Past Medical History:  Diagnosis Date  . Arthritis   . Cataracts, bilateral   . Closed fracture of unspecified part of lower end of humerus   . Dementia   . Hypertension   . Insomnia, unspecified   . Macular degeneration (senile) of retina, unspecified   . Osteoarthritis   . Osteoarthrosis, unspecified whether generalized or localized, unspecified site   . Pancreatitis, acute 08/14/2013  . Retinal neovascularization NOS   . Senile osteoporosis   . Subarachnoid hemorrhage following injury ([place]) 03/31/2012  . Unspecified hearing loss   . Unspecified hypothyroidism   . Urinary tract infection 08/10/2013  . Vertebral fracture 08/09/2013   T7 10%, T12 70%    Past Surgical History:  Procedure Laterality Date  . ABDOMINAL HYSTERECTOMY  1942  . ANKLE FRACTURE SURGERY  1960  . BLADDER SUSPENSION    . CLOSED REDUCTION WITH HUMERAL PIN INSERTION  04/01/2012   Procedure: CLOSED REDUCTION WITH HUMERAL PIN INSERTION;  Surgeon: Schuyler Amor, MD;  Location: [facility];  Service: Orthopedics;  Laterality: Left;    No Known Allergies  Allergies as of 12/25/2016   No Known Allergies     Medication List       Accurate as of 12/25/16 12:48 PM. Always use your most recent med list.          acetaminophen 500 MG tablet Commonly known as:  TYLENOL Take 500 mg by mouth every 6 (six) hours as needed. Pain   amLODipine 10 MG tablet Commonly known as:  NORVASC Take 10 mg by mouth daily.   aspirin EC 81 MG tablet Take 81 mg by  mouth daily.   ketoconazole 2 % shampoo Commonly known as:  NIZORAL Apply 1 application topically. Shampoo once a week on Wednesday   levothyroxine 50 MCG tablet Commonly known as:  SYNTHROID, LEVOTHROID Take 50 mcg by mouth daily before breakfast.   LORazepam 1 MG tablet Commonly known as:  ATIVAN Take 1 mg by mouth as needed for anxiety (Give 1 hour prior to  dental appointment).   losartan 100 MG tablet Commonly known as:  COZAAR Take 100 mg by mouth daily.   metoprolol tartrate 100 MG tablet Commonly known as:  LOPRESSOR Take 100 mg by mouth 2 (two) times daily.   olive oil external oil Apply topically. Instill 1 drop in each ear at bedtime   raloxifene 60 MG tablet Commonly known as:  EVISTA Take 60 mg by mouth daily.   senna-docusate 8.6-50 MG tablet Commonly known as:  Senokot-S Take 2 tablets by mouth at bedtime.       Review of Systems  Constitutional: Negative for activity change, appetite change, chills, fatigue and fever.  HENT: Positive for hearing loss. Negative for congestion, rhinorrhea, sinus pain, sinus pressure, sneezing, sore throat and trouble swallowing.   Eyes: Negative for pain, discharge, redness and itching.       Wears eye glasses  Respiratory: Negative for cough, chest tightness, shortness of breath and wheezing.   Cardiovascular: Negative for chest pain, palpitations and leg swelling.  Gastrointestinal: Negative for abdominal distention, abdominal pain, constipation, diarrhea, nausea and vomiting.  Endocrine: Negative for cold intolerance, heat intolerance, polydipsia, polyphagia and polyuria.  Genitourinary: Negative for dysuria, flank pain, frequency and urgency.  Musculoskeletal: Positive for arthralgias and gait problem.  Skin: Positive for rash. Negative for color change and pallor.       Gluteal rash has improved with treatment  Neurological: Negative for dizziness, seizures, syncope, light-headedness, numbness and headaches.  Hematological: Does not bruise/bleed easily.  Psychiatric/Behavioral: Positive for confusion. Negative for agitation, hallucinations and sleep disturbance. The patient is not nervous/anxious.     Immunization History  Administered Date(s) Administered  . Influenza-Unspecified 03/02/2013, 02/15/2014, 02/16/2015, 02/22/2016  . PPD Test 03/23/2012  . Pneumococcal  Polysaccharide-23 09/22/2002   Pertinent  Health Maintenance Due  Topic Date Due  . INFLUENZA VACCINE  12/11/2016  . DEXA SCAN  05/13/2017 (Originally 12/29/1983)  . PNA vac Low Risk Adult (2 of 2 - PCV13) 05/13/2017 (Originally 09/22/2003)   Fall Risk  12/13/2016 02/07/2015 02/22/2014 02/22/2014  Falls in the past year? No No Yes No  Number falls in past yr: - - 1 -  Risk for fall due to : - - History of fall(s) -    Vitals:   12/25/16 1100  BP: 110/64  Pulse: 76  Resp: 20  Temp: 98 F (36.7 C)  SpO2: 98%  Weight: 151 lb 14.4 oz (68.9 kg)  Height: 5\' 2"  (1.575 m)   Body mass index is 27.78 kg/m. Physical Exam  Constitutional:  Frail elderly in no acute distress   HENT:  Head: Normocephalic.  Mouth/Throat: Oropharynx is clear and moist. No oropharyngeal exudate.  Eyes: Pupils are equal, round, and reactive to light. Conjunctivae and EOM are normal. Right eye exhibits no discharge. Left eye exhibits no discharge. No scleral icterus.  Neck: Normal range of motion. No JVD present. No thyromegaly present.  Cardiovascular: Normal rate, regular rhythm, normal heart sounds and intact distal pulses.  Exam reveals no gallop and no friction rub.   No murmur heard. Pulmonary/Chest: Effort normal and breath  sounds normal. No respiratory distress. She has no wheezes. She has no rales.  Abdominal: Soft. Bowel sounds are normal. She exhibits no distension. There is no tenderness. There is no rebound and no guarding.  Musculoskeletal: She exhibits no edema, tenderness or deformity.  Unsteady gait uses FWW.   Lymphadenopathy:    She has no cervical adenopathy.  Neurological: She is alert. Coordination normal.  Skin: Skin is warm and dry. No erythema. No pallor.  Gluteal fold skin redness has improved.   Psychiatric: She has a normal mood and affect.    Labs reviewed:  Recent Labs  07/01/16 07/18/16 09/23/16  NA 140 139 138  K 3.9 4.0 4.0  BUN 14 17 15   CREATININE 0.6 0.6 0.6     Recent Labs  07/01/16 09/23/16  AST 15 14  ALT 16 15  ALKPHOS 39 35    Recent Labs  07/01/16 09/23/16  WBC 6.0 5.4  HGB 13.3 11.5*  HCT 40 34*  PLT 194 160   Lab Results  Component Value Date   TSH 3.64 07/01/2016   Lab Results  Component Value Date   HGBA1C 6.7 07/18/2016   Significant Diagnostic Results in last 30 days:  No results found.  Assessment/Plan 1. Essential hypertension B/p stable. Continue on metoprolol 100 mg tablet twice daily, Amlodipine 10 mg tablet daily and Losartan 100 mg tablet daily. On ASA for prophylaxis. Continue to monitor.   2. Hypothyroidism Lab Results  Component Value Date   TSH 3.64 07/01/2016  Continue on Levothyroxine 50 mcg tablet daily. Check TSH level 12/26/2016.   3. Gastroesophageal reflux disease without esophagitis Stable.Continue to monitor.   4. Slow transit constipation Current regimen effective.LBM 12/24/2016.Continue to encourage oral intake and hydration.   5. Weight loss  Has had 3.4 lbs over one month.Registered Dietician to continue to follow up.   Family/ staff Communication: Reviewed plan of care with patient and facility Nurse supervisor   Labs/tests ordered: TSH level 12/26/2016.  Sandrea Hughs, NP

## 2016-12-26 DIAGNOSIS — E039 Hypothyroidism, unspecified: Secondary | ICD-10-CM | POA: Diagnosis not present

## 2016-12-26 LAB — TSH: TSH: 2.65 (ref 0.41–5.90)

## 2017-01-09 ENCOUNTER — Other Ambulatory Visit: Payer: Self-pay | Admitting: *Deleted

## 2017-01-22 ENCOUNTER — Encounter: Payer: Self-pay | Admitting: Internal Medicine

## 2017-01-22 ENCOUNTER — Non-Acute Institutional Stay (SKILLED_NURSING_FACILITY): Payer: Medicare Other | Admitting: Internal Medicine

## 2017-01-22 DIAGNOSIS — M159 Polyosteoarthritis, unspecified: Secondary | ICD-10-CM | POA: Diagnosis not present

## 2017-01-22 DIAGNOSIS — M8000XA Age-related osteoporosis with current pathological fracture, unspecified site, initial encounter for fracture: Secondary | ICD-10-CM | POA: Insufficient documentation

## 2017-01-22 DIAGNOSIS — F0151 Vascular dementia with behavioral disturbance: Secondary | ICD-10-CM

## 2017-01-22 DIAGNOSIS — M8000XS Age-related osteoporosis with current pathological fracture, unspecified site, sequela: Secondary | ICD-10-CM | POA: Diagnosis not present

## 2017-01-22 DIAGNOSIS — F01518 Vascular dementia, unspecified severity, with other behavioral disturbance: Secondary | ICD-10-CM | POA: Insufficient documentation

## 2017-01-22 DIAGNOSIS — E039 Hypothyroidism, unspecified: Secondary | ICD-10-CM | POA: Diagnosis not present

## 2017-01-22 DIAGNOSIS — R2681 Unsteadiness on feet: Secondary | ICD-10-CM | POA: Diagnosis not present

## 2017-01-22 NOTE — Progress Notes (Signed)
Location:  [facility] Room Number: 2 Place of Service:  SNF (31) Provider:  Blanchie Serve MD  Blanchie Serve, MD  Patient Care Team: Blanchie Serve, MD as PCP - General (Internal Medicine) Mast, Man X, NP as Nurse Practitioner (Nurse Practitioner) Charlotte Crumb, MD as Consulting Physician (Orthopedic Surgery) Wallene Huh, DPM as Consulting Physician (Podiatry) Gaynelle Arabian, MD as Consulting Physician (Orthopedic Surgery) Zebedee Iba., MD as Referring Physician (Ophthalmology) Newman Pies, MD as Consulting Physician (Neurosurgery) Melina Modena, [facility] (Skilled Nursing and [place])  Extended Emergency Contact Information Primary Emergency Contact: [place] of [place] Phone: [phone] Mobile Phone: [phone] Relation: Daughter Secondary Emergency Contact: Aneta of [place] Phone: [phone] Relation: Grandson  Code Status:  DNR Goals of care: Advanced Directive information Advanced Directives 01/22/2017  Does Patient Have a Medical Advance Directive? Yes  Type of Paramedic of [place];Living will;Out of facility DNR (pink MOST or yellow form)  Does patient want to make changes to medical advance directive? No - Patient declined  Copy of [place] in Chart? Yes  Pre-existing out of facility DNR order (yellow form or pink MOST form) Yellow form placed in chart (order not valid for inpatient use);Pink MOST form placed in chart (order not valid for inpatient use)     Chief Complaint  Patient presents with  . Medical Management of Chronic Issues    Routine Visit     HPI:  Pt is a 83 y.o. female seen today for medical management of chronic diseases. She has been pleasantly confused. She has limited participation in HPI and ROS with dementia. She is incontient with her urine. She feeds herself. She refuses to wear her hearing  aid. No concern from nursing.   Past Medical History:  Diagnosis Date  . Arthritis   . Cataracts, bilateral   . Closed fracture of unspecified part of lower end of humerus   . Dementia   . Hypertension   . Insomnia, unspecified   . Macular degeneration (senile) of retina, unspecified   . Osteoarthritis   . Osteoarthrosis, unspecified whether generalized or localized, unspecified site   . Pancreatitis, acute 08/14/2013  . Retinal neovascularization NOS   . Senile osteoporosis   . Subarachnoid hemorrhage following injury ([place]) 03/31/2012  . Unspecified hearing loss   . Unspecified hypothyroidism   . Urinary tract infection 08/10/2013  . Vertebral fracture 08/09/2013   T7 10%, T12 70%    Past Surgical History:  Procedure Laterality Date  . ABDOMINAL HYSTERECTOMY  1942  . ANKLE FRACTURE SURGERY  1960  . BLADDER SUSPENSION    . CLOSED REDUCTION WITH HUMERAL PIN INSERTION  04/01/2012   Procedure: CLOSED REDUCTION WITH HUMERAL PIN INSERTION;  Surgeon: Schuyler Amor, MD;  Location: [facility];  Service: Orthopedics;  Laterality: Left;    No Known Allergies  Outpatient Encounter Prescriptions as of 01/22/2017  Medication Sig  . acetaminophen (TYLENOL) 500 MG tablet Take 500 mg by mouth every 6 (six) hours as needed. Pain  . amLODipine (NORVASC) 10 MG tablet Take 10 mg by mouth daily.  Marland Kitchen aspirin EC 81 MG tablet Take 81 mg by mouth daily.  Marland Kitchen ketoconazole (NIZORAL) 2 % shampoo Apply 1 application topically. Shampoo once a week on Wednesday  . levothyroxine (SYNTHROID, LEVOTHROID) 50 MCG tablet Take 50 mcg by mouth daily before breakfast.  . LORazepam (ATIVAN) 1 MG tablet Take 1 mg by mouth as  needed for anxiety (Give 1 hour prior to dental appointment).  . losartan (COZAAR) 100 MG tablet Take 100 mg by mouth daily.  . metoprolol (LOPRESSOR) 100 MG tablet Take 100 mg by mouth 2 (two) times daily.   Marland Kitchen olive oil external oil Apply topically. Instill 1 drop in each ear at bedtime  .  raloxifene (EVISTA) 60 MG tablet Take 60 mg by mouth daily.  Marland Kitchen senna-docusate (SENOKOT-S) 8.6-50 MG per tablet Take 2 tablets by mouth at bedtime as needed.    No facility-administered encounter medications on file as of 01/22/2017.     Review of Systems  Constitutional: Negative for appetite change, chills, diaphoresis and fever.       Needs assistance with bathing, toileting and dressing. No fall reported.  HENT: Positive for hearing loss. Negative for congestion, mouth sores, sore throat and trouble swallowing.        She feeds herself and eats regular consistency food, takes her medications crushed in ice cream  Respiratory: Negative for cough and shortness of breath.   Cardiovascular: Positive for leg swelling. Negative for chest pain and palpitations.       Chronic leg edema left >right  Gastrointestinal: Negative for abdominal pain, constipation, nausea and vomiting.  Genitourinary: Negative for dysuria.       Has urinary incontinence  Musculoskeletal: Positive for gait problem. Negative for back pain.       Uses her walker to get around and needs minimum assistance with transfer  Skin: Negative for rash.  Neurological: Negative for tremors and syncope.  Psychiatric/Behavioral: Positive for agitation, behavioral problems and confusion.       Has dementia, redirection helps    Immunization History  Administered Date(s) Administered  . Influenza-Unspecified 03/02/2013, 02/15/2014, 02/16/2015, 02/22/2016  . PPD Test 03/23/2012  . Pneumococcal Polysaccharide-23 09/22/2002   Pertinent  Health Maintenance Due  Topic Date Due  . INFLUENZA VACCINE  03/13/2017 (Originally 12/11/2016)  . DEXA SCAN  05/13/2017 (Originally 12/29/1983)  . PNA vac Low Risk Adult (2 of 2 - PCV13) 05/13/2017 (Originally 09/22/2003)   Fall Risk  12/13/2016 02/07/2015 02/22/2014 02/22/2014  Falls in the past year? No No Yes No  Number falls in past yr: - - 1 -  Risk for fall due to : - - History of fall(s) -    Functional Status Survey:    Vitals:   01/22/17 1459  BP: (!) 147/81  Pulse: 90  Resp: 20  Temp: 97.8 F (36.6 C)  TempSrc: Oral  SpO2: 94%  Weight: 151 lb (68.5 kg)  Height: 5\' 2"  (1.575 m)   Body mass index is 27.62 kg/m.   Wt Readings from Last 3 Encounters:  01/22/17 151 lb (68.5 kg)  12/25/16 151 lb 14.4 oz (68.9 kg)  12/20/16 151 lb 14.4 oz (68.9 kg)   Physical Exam  Constitutional: She appears well-developed and well-nourished. No distress.  HENT:  Head: Normocephalic and atraumatic.  Mouth/Throat: Oropharynx is clear and moist.  Eyes: Pupils are equal, round, and reactive to light. Conjunctivae are normal.  Neck: Normal range of motion. Neck supple. No thyromegaly present.  Cardiovascular: Normal rate and regular rhythm.   Pulmonary/Chest: Effort normal and breath sounds normal. She has no wheezes. She has no rales.  Abdominal: Soft. Bowel sounds are normal. She exhibits no distension. There is no tenderness. There is no rebound and no guarding.  Musculoskeletal:  Can move all 4 extremities, kyphosis +, uses a walker to get around, unsteady gait, trace right leg  and 1+ left leg edema, limited ROM to both her shoulders  Lymphadenopathy:    She has no cervical adenopathy.  Neurological: She is alert.  Oriented to self only  Skin: Skin is warm and dry. She is not diaphoretic.  Psychiatric:  pleasantly confused     Labs reviewed:  Recent Labs  07/01/16 07/18/16 09/23/16  NA 140 139 138  K 3.9 4.0 4.0  BUN 14 17 15   CREATININE 0.6 0.6 0.6    Recent Labs  07/01/16 09/23/16  AST 15 14  ALT 16 15  ALKPHOS 39 35    Recent Labs  07/01/16 09/23/16  WBC 6.0 5.4  HGB 13.3 11.5*  HCT 40 34*  PLT 194 160   Lab Results  Component Value Date   TSH 2.65 12/26/2016   Lab Results  Component Value Date   HGBA1C 6.7 07/18/2016   No results found for: CHOL, HDL, LDLCALC, LDLDIRECT, TRIG, CHOLHDL  Significant Diagnostic Results in last 30 days:  No  results found.  Assessment/Plan  Unsteady gait High fall risk, no recent fall reported. To use walker and assistance with transfer, walker for mobility. Fall precautions  Degenerative joint disease Continue acetaminophen 500 mg q6h prn pain, walker for mobility.   Vascular dementia Advanced. Supportive care. Continue aspirin ec 81 mg daily. Controlled BP reading. Unable to perform MMSE. Redirection has been helpful with mood changes. Not on any medication.   Osteoporosis Currently on raloxifene daily. Has been on this medication atleast since 2013. Unable to obtain repeat dexa scan given her dementia. With her being on this medication for several years, check vitamin d, lipid and calcium level. Add calcium and vitamin d supplement.   Hypothyroidism Lab Results  Component Value Date   TSH 2.65 12/26/2016   tsh stable, continue levothyroxine 50 mcg daily    Family/ staff Communication: reviewed care plan with patient and charge nurse.    Labs/tests ordered:  Lipid panel, bmp, vitamin d   Blanchie Serve, MD Internal Medicine [facility] [address] [place], [place] [address] Cell Phone (Monday-Friday 8 am - 5 pm): [phone] On Call: [phone] and follow prompts after 5 pm and on weekends Office Phone: [phone] Office Fax: [phone]

## 2017-01-23 DIAGNOSIS — E039 Hypothyroidism, unspecified: Secondary | ICD-10-CM | POA: Diagnosis not present

## 2017-01-23 DIAGNOSIS — E559 Vitamin D deficiency, unspecified: Secondary | ICD-10-CM | POA: Diagnosis not present

## 2017-01-23 DIAGNOSIS — Z79810 Long term (current) use of selective estrogen receptor modulators (SERMs): Secondary | ICD-10-CM | POA: Diagnosis not present

## 2017-01-23 DIAGNOSIS — I10 Essential (primary) hypertension: Secondary | ICD-10-CM | POA: Diagnosis not present

## 2017-01-23 DIAGNOSIS — M8000XA Age-related osteoporosis with current pathological fracture, unspecified site, initial encounter for fracture: Secondary | ICD-10-CM | POA: Diagnosis not present

## 2017-01-23 DIAGNOSIS — Z8673 Personal history of transient ischemic attack (TIA), and cerebral infarction without residual deficits: Secondary | ICD-10-CM | POA: Diagnosis not present

## 2017-01-23 LAB — LIPID PANEL
CHOLESTEROL: 146 (ref 0–200)
HDL: 39 (ref 35–70)
LDL Cholesterol: 76
TRIGLYCERIDES: 214 — AB (ref 40–160)

## 2017-01-23 LAB — BASIC METABOLIC PANEL
BUN: 18 (ref 4–21)
CREATININE: 0.7 (ref 0.5–1.1)
Glucose: 192
POTASSIUM: 4 (ref 3.4–5.3)
Sodium: 136 — AB (ref 137–147)

## 2017-01-23 LAB — VITAMIN D 25 HYDROXY (VIT D DEFICIENCY, FRACTURES): Vit D, 25-Hydroxy: 19

## 2017-01-24 ENCOUNTER — Non-Acute Institutional Stay (SKILLED_NURSING_FACILITY): Payer: Medicare Other | Admitting: Family

## 2017-01-24 ENCOUNTER — Encounter: Payer: Self-pay | Admitting: Family

## 2017-01-24 ENCOUNTER — Encounter: Payer: Self-pay | Admitting: *Deleted

## 2017-01-24 DIAGNOSIS — E559 Vitamin D deficiency, unspecified: Secondary | ICD-10-CM | POA: Diagnosis not present

## 2017-01-24 NOTE — Progress Notes (Signed)
Location:  [facility] Room Number: 2 Place of Service:  SNF (31) Provider: Dinah Ngetich FNP-C  Blanchie Serve, MD  Patient Care Team: Blanchie Serve, MD as PCP - General (Internal Medicine) Mast, Man X, NP as Nurse Practitioner (Nurse Practitioner) Charlotte Crumb, MD as Consulting Physician (Orthopedic Surgery) Wallene Huh, DPM as Consulting Physician (Podiatry) Gaynelle Arabian, MD as Consulting Physician (Orthopedic Surgery) Zebedee Iba., MD as Referring Physician (Ophthalmology) Newman Pies, MD as Consulting Physician (Neurosurgery) Melina Modena, [facility] (Skilled Nursing and [place])  Extended Emergency Contact Information Primary Emergency Contact: [place] of [place] Phone: [phone] Mobile Phone: [phone] Relation: Daughter Secondary Emergency Contact: [place] of [place] Phone: [phone] Relation: Grandson  Code Status:  DNR Goals of care: Advanced Directive information Advanced Directives 01/24/2017  Does Patient Have a Medical Advance Directive? Yes  Type of Advance Directive Out of facility DNR (pink MOST or yellow form);Living will;Healthcare Power of Attorney  Does patient want to make changes to medical advance directive? -  Copy of [place] in Chart? Yes  Pre-existing out of facility DNR order (yellow form or pink MOST form) Yellow form placed in chart (order not valid for inpatient use)     Chief Complaint  Patient presents with  . Acute Visit    abnormal labs    HPI:  Pt is a 83 y.o. female seen today at [facility] for an acute visit for evaluation of abnormal lab results. She is seen in her room today. She denies any acute issues. Facility Nurse reports no new concerns. Her recent lab results showed low vit D level 19 ( 01/17/2017).currently not on vit D supplement.     Past Medical History:  Diagnosis Date  .  Arthritis   . Cataracts, bilateral   . Closed fracture of unspecified part of lower end of humerus   . Dementia   . Hypertension   . Insomnia, unspecified   . Macular degeneration (senile) of retina, unspecified   . Osteoarthritis   . Osteoarthrosis, unspecified whether generalized or localized, unspecified site   . Pancreatitis, acute 08/14/2013  . Retinal neovascularization NOS   . Senile osteoporosis   . Subarachnoid hemorrhage following injury ([place]) 03/31/2012  . Unspecified hearing loss   . Unspecified hypothyroidism   . Urinary tract infection 08/10/2013  . Vertebral fracture 08/09/2013   T7 10%, T12 70%    Past Surgical History:  Procedure Laterality Date  . ABDOMINAL HYSTERECTOMY  1942  . ANKLE FRACTURE SURGERY  1960  . BLADDER SUSPENSION    . CLOSED REDUCTION WITH HUMERAL PIN INSERTION  04/01/2012   Procedure: CLOSED REDUCTION WITH HUMERAL PIN INSERTION;  Surgeon: Schuyler Amor, MD;  Location: [facility];  Service: Orthopedics;  Laterality: Left;    No Known Allergies  Outpatient Encounter Prescriptions as of 01/24/2017  Medication Sig  . acetaminophen (TYLENOL) 500 MG tablet Take 500 mg by mouth every 6 (six) hours as needed. Pain  . amLODipine (NORVASC) 10 MG tablet Take 10 mg by mouth daily.  Marland Kitchen aspirin EC 81 MG tablet Take 81 mg by mouth daily.  Marland Kitchen ketoconazole (NIZORAL) 2 % shampoo Apply 1 application topically. Shampoo once a week on Wednesday  . levothyroxine (SYNTHROID, LEVOTHROID) 50 MCG tablet Take 50 mcg by mouth daily before breakfast.  . LORazepam (ATIVAN) 1 MG tablet Take 1 mg by mouth as needed for anxiety (Give 1 hour prior to dental  appointment).  . losartan (COZAAR) 100 MG tablet Take 100 mg by mouth daily.  . metoprolol (LOPRESSOR) 100 MG tablet Take 100 mg by mouth 2 (two) times daily.   Marland Kitchen olive oil external oil Apply topically. Instill 1 drop in each ear at bedtime  . raloxifene (EVISTA) 60 MG tablet Take 60 mg by mouth daily.  Marland Kitchen senna-docusate  (SENOKOT-S) 8.6-50 MG per tablet Take 2 tablets by mouth at bedtime as needed.    No facility-administered encounter medications on file as of 01/24/2017.     Review of Systems  Constitutional: Negative for activity change, appetite change, chills, fatigue and fever.  Respiratory: Negative for cough, chest tightness, shortness of breath and wheezing.   Cardiovascular: Negative for chest pain, palpitations and leg swelling.  Gastrointestinal: Negative for abdominal distention, abdominal pain, constipation, diarrhea, nausea and vomiting.  Endocrine: Negative for cold intolerance and heat intolerance.  Musculoskeletal: Positive for gait problem.  Skin: Negative for color change, pallor and rash.  Neurological: Negative for dizziness, light-headedness and headaches.  Psychiatric/Behavioral: Positive for confusion. Negative for agitation, hallucinations and sleep disturbance.    Immunization History  Administered Date(s) Administered  . Influenza-Unspecified 03/02/2013, 02/15/2014, 02/16/2015, 02/22/2016  . PPD Test 03/23/2012  . Pneumococcal Conjugate-13 12/17/2016  . Pneumococcal Polysaccharide-23 09/22/2002  . Td 11/17/2002  . Tdap 12/16/2016   Pertinent  Health Maintenance Due  Topic Date Due  . INFLUENZA VACCINE  03/13/2017 (Originally 12/11/2016)  . DEXA SCAN  05/13/2017 (Originally 12/29/1983)  . PNA vac Low Risk Adult (2 of 2 - PCV13) 05/13/2017 (Originally 09/22/2003)   Fall Risk  12/13/2016 02/07/2015 02/22/2014 02/22/2014  Falls in the past year? No No Yes No  Number falls in past yr: - - 1 -  Risk for fall due to : - - History of fall(s) -    Vitals:   01/24/17 0939  BP: (!) 147/81  Pulse: 80  Resp: 20  Temp: (!) 96.8 F (36 C)  SpO2: 94%  Weight: 151 lb (68.5 kg)  Height: 5\' 2"  (1.575 m)   Body mass index is 27.62 kg/m. Physical Exam  Constitutional: She appears well-developed and well-nourished. No distress.  Elderly   HENT:  Head: Normocephalic.    Mouth/Throat: Oropharynx is clear and moist. No oropharyngeal exudate.  HOH left ear worst than the right   Eyes: Pupils are equal, round, and reactive to light. Conjunctivae and EOM are normal. Right eye exhibits no discharge. Left eye exhibits no discharge. No scleral icterus.  Neck: Normal range of motion. No JVD present. No thyromegaly present.  Cardiovascular: Normal rate, regular rhythm, normal heart sounds and intact distal pulses.  Exam reveals no gallop and no friction rub.   No murmur heard. Pulmonary/Chest: Effort normal and breath sounds normal. No respiratory distress. She has no wheezes. She has no rales.  Abdominal: Soft. Bowel sounds are normal. She exhibits no distension. There is no tenderness. There is no rebound and no guarding.  Musculoskeletal: She exhibits no edema or tenderness.  Unsteady gait uses FWW  Lymphadenopathy:    She has no cervical adenopathy.  Neurological: Coordination normal.  Pleasantly confused at her baseline.   Skin: Skin is warm and dry. No rash noted. No erythema. No pallor.  Psychiatric: She has a normal mood and affect.   Labs reviewed:  Recent Labs  07/18/16 09/23/16 01/23/17  NA 139 138 136*  K 4.0 4.0 4.0  BUN 17 15 18   CREATININE 0.6 0.6 0.7    Recent Labs  07/01/16 09/23/16  AST 15 14  ALT 16 15  ALKPHOS 39 35    Recent Labs  07/01/16 09/23/16  WBC 6.0 5.4  HGB 13.3 11.5*  HCT 40 34*  PLT 194 160   Lab Results  Component Value Date   TSH 2.65 12/26/2016   Lab Results  Component Value Date   HGBA1C 6.7 07/18/2016   Lab Results  Component Value Date   CHOL 146 01/23/2017   HDL 39 01/23/2017   LDLCALC 76 01/23/2017   TRIG 214 (A) 01/23/2017    Significant Diagnostic Results in last 30 days:  No results found.  Assessment/Plan   Vitamin D deficiency Recent vit D level 19 (01/23/2017). Start vit D 1000 units Tablet daily. Recheck Vit D level in 8 weeks.   Family/ staff Communication: Reviewed plan of care  with patient and facility Nurse   Labs/tests ordered: None   Nelda Bucks Ngetich, NP

## 2017-02-11 DIAGNOSIS — H35033 Hypertensive retinopathy, bilateral: Secondary | ICD-10-CM | POA: Diagnosis not present

## 2017-02-11 DIAGNOSIS — Z961 Presence of intraocular lens: Secondary | ICD-10-CM | POA: Diagnosis not present

## 2017-02-11 DIAGNOSIS — H353232 Exudative age-related macular degeneration, bilateral, with inactive choroidal neovascularization: Secondary | ICD-10-CM | POA: Diagnosis not present

## 2017-02-11 DIAGNOSIS — H43813 Vitreous degeneration, bilateral: Secondary | ICD-10-CM | POA: Diagnosis not present

## 2017-02-19 ENCOUNTER — Non-Acute Institutional Stay (SKILLED_NURSING_FACILITY): Payer: Medicare Other | Admitting: Family

## 2017-02-19 ENCOUNTER — Encounter: Payer: Self-pay | Admitting: Family

## 2017-02-19 DIAGNOSIS — Z8673 Personal history of transient ischemic attack (TIA), and cerebral infarction without residual deficits: Secondary | ICD-10-CM | POA: Diagnosis not present

## 2017-02-19 DIAGNOSIS — E559 Vitamin D deficiency, unspecified: Secondary | ICD-10-CM | POA: Diagnosis not present

## 2017-02-19 DIAGNOSIS — M810 Age-related osteoporosis without current pathological fracture: Secondary | ICD-10-CM

## 2017-02-19 DIAGNOSIS — I10 Essential (primary) hypertension: Secondary | ICD-10-CM

## 2017-02-19 DIAGNOSIS — E039 Hypothyroidism, unspecified: Secondary | ICD-10-CM

## 2017-02-19 NOTE — Progress Notes (Signed)
Location:  [place] Room Number: 2 Place of Service:  SNF (31) Provider: Dinah Ngetich FNP-C   Blanchie Serve, MD  Patient Care Team: Blanchie Serve, MD as PCP - General (Internal Medicine) Mast, Man X, NP as Nurse Practitioner (Nurse Practitioner) Charlotte Crumb, MD as Consulting Physician (Orthopedic Surgery) Wallene Huh, DPM as Consulting Physician (Podiatry) Gaynelle Arabian, MD as Consulting Physician (Orthopedic Surgery) Zebedee Iba., MD as Referring Physician (Ophthalmology) Newman Pies, MD as Consulting Physician (Neurosurgery) Melina Modena, [facility] (Skilled Nursing and [place])  Extended Emergency Contact Information Primary Emergency Contact: [place] of [place] Phone: [phone] Mobile Phone: [phone] Relation: Daughter Secondary Emergency Contact: [place] of [place] Phone: [phone] Relation: Grandson  Code Status:  DNR  Goals of care: Advanced Directive information Advanced Directives 02/19/2017  Does Patient Have a Medical Advance Directive? Yes  Type of Advance Directive Out of facility DNR (pink MOST or yellow form);Living will;Healthcare Power of Attorney  Does patient want to make changes to medical advance directive? -  Copy of [place] in Chart? Yes  Pre-existing out of facility DNR order (yellow form or pink MOST form) Yellow form placed in chart (order not valid for inpatient use);Pink MOST form placed in chart (order not valid for inpatient use)     Chief Complaint  Patient presents with  . Medical Management of Chronic Issues    routine visit    HPI:  Pt is a 83 y.o. female seen today [place] for medical management of chronic diseases.she has a medical history of HTN,CVA, hypothyroidism,GERD, OA, Osteoporosis,Vascular dementia among other conditions. She is seen in her room today. She denies any acute issues  this visit.She has had a weight loss of  1.3 pounds over two months.Facility Nurse reports patient has good appetite and eats > 50 % and snacks. No recent fall episodes reported.        Past Medical History:  Diagnosis Date  . Arthritis   . Cataracts, bilateral   . Closed fracture of unspecified part of lower end of humerus   . Dementia   . Hypertension   . Insomnia, unspecified   . Macular degeneration (senile) of retina, unspecified   . Osteoarthritis   . Osteoarthrosis, unspecified whether generalized or localized, unspecified site   . Pancreatitis, acute 08/14/2013  . Retinal neovascularization NOS   . Senile osteoporosis   . Subarachnoid hemorrhage following injury ([place]) 03/31/2012  . Unspecified hearing loss   . Unspecified hypothyroidism   . Urinary tract infection 08/10/2013  . Vertebral fracture 08/09/2013   T7 10%, T12 70%    Past Surgical History:  Procedure Laterality Date  . ABDOMINAL HYSTERECTOMY  1942  . ANKLE FRACTURE SURGERY  1960  . BLADDER SUSPENSION    . CLOSED REDUCTION WITH HUMERAL PIN INSERTION  04/01/2012   Procedure: CLOSED REDUCTION WITH HUMERAL PIN INSERTION;  Surgeon: Schuyler Amor, MD;  Location: [facility]S;  Service: Orthopedics;  Laterality: Left;    No Known Allergies  Allergies as of 02/19/2017   No Known Allergies     Medication List       Accurate as of 02/19/17  6:10 PM. Always use your most recent med list.          acetaminophen 500 MG tablet Commonly known as:  TYLENOL Take 500 mg by mouth every 6 (six) hours as needed. Pain   amLODipine 10 MG tablet Commonly known  as:  NORVASC Take 10 mg by mouth daily.   aspirin EC 81 MG tablet Take 81 mg by mouth daily.   Cholecalciferol 1000 units capsule Take 1,000 Units by mouth daily.   ketoconazole 2 % shampoo Commonly known as:  NIZORAL Apply 1 application topically. Shampoo once a week on Wednesday   levothyroxine 50 MCG tablet Commonly known as:  SYNTHROID,  LEVOTHROID Take 50 mcg by mouth daily before breakfast.   LORazepam 1 MG tablet Commonly known as:  ATIVAN Take 1 mg by mouth as needed for anxiety (Give 1 hour prior to dental appointment).   losartan 100 MG tablet Commonly known as:  COZAAR Take 100 mg by mouth daily.   metoprolol tartrate 100 MG tablet Commonly known as:  LOPRESSOR Take 100 mg by mouth 2 (two) times daily.   olive oil external oil Apply topically. Instill 1 drop in each ear at bedtime   raloxifene 60 MG tablet Commonly known as:  EVISTA Take 60 mg by mouth daily.   senna-docusate 8.6-50 MG tablet Commonly known as:  Senokot-S Take 2 tablets by mouth daily as needed.       Review of Systems  Constitutional: Negative for activity change, appetite change, chills, fatigue and fever.  HENT: Negative for congestion, rhinorrhea, sinus pain, sinus pressure, sneezing, sore throat and trouble swallowing.   Eyes: Negative for pain, discharge and redness.  Respiratory: Negative for cough, chest tightness, shortness of breath and wheezing.   Cardiovascular: Negative for chest pain and palpitations.       Chronic leg swelling   Gastrointestinal: Negative for abdominal distention, abdominal pain, constipation, diarrhea, nausea and vomiting.  Endocrine: Negative for cold intolerance, heat intolerance, polydipsia, polyphagia and polyuria.  Genitourinary: Negative for dysuria, flank pain and urgency.  Musculoskeletal: Positive for arthralgias and gait problem.  Skin: Negative for color change, pallor and rash.  Neurological: Negative for dizziness, seizures, syncope, light-headedness and headaches.  Hematological: Does not bruise/bleed easily.  Psychiatric/Behavioral: Positive for confusion. Negative for agitation, hallucinations and sleep disturbance. The patient is not nervous/anxious.     Immunization History  Administered Date(s) Administered  . Influenza-Unspecified 03/02/2013, 02/15/2014, 02/16/2015, 02/22/2016   . PPD Test 03/23/2012  . Pneumococcal Conjugate-13 12/17/2016  . Pneumococcal Polysaccharide-23 09/22/2002  . Td 11/17/2002  . Tdap 12/16/2016   Pertinent  Health Maintenance Due  Topic Date Due  . INFLUENZA VACCINE  03/13/2017 (Originally 12/11/2016)  . DEXA SCAN  05/13/2017 (Originally 12/29/1983)  . PNA vac Low Risk Adult  Completed   Fall Risk  12/13/2016 02/07/2015 02/22/2014 02/22/2014  Falls in the past year? No No Yes No  Number falls in past yr: - - 1 -  Risk for fall due to : - - History of fall(s) -    Vitals:   02/19/17 1151  BP: 135/65  Pulse: 60  Resp: 20  Temp: (!) 97.3 F (36.3 C)  SpO2: 97%  Weight: 150 lb 9.6 oz (68.3 kg)  Height: 5\' 2"  (1.575 m)   Body mass index is 27.55 kg/m. Physical Exam  Constitutional: She appears well-developed.  Elderly in no acute distress   HENT:  Head: Normocephalic.  Right Ear: External ear normal.  Left Ear: External ear normal.  Mouth/Throat: Oropharynx is clear and moist. No oropharyngeal exudate.  Eyes: Pupils are equal, round, and reactive to light. Conjunctivae and EOM are normal. Right eye exhibits no discharge. Left eye exhibits no discharge. No scleral icterus.  Neck: Normal range of motion. No JVD present.  No thyromegaly present.  Cardiovascular: Normal rate, regular rhythm, normal heart sounds and intact distal pulses.  Exam reveals no gallop and no friction rub.   No murmur heard. Pulmonary/Chest: Effort normal and breath sounds normal. No respiratory distress. She has no wheezes. She has no rales.  Abdominal: Soft. Bowel sounds are normal. She exhibits no distension and no mass. There is no tenderness. There is no rebound and no guarding.  Genitourinary:  Genitourinary Comments: Incontinent   Musculoskeletal: She exhibits no tenderness.  Moves x 4 extremities except limited ROM to left  shoulders.Unsteady gait uses walker. Lower extremities chronic non-pitting edema to left leg.Severe Spinal kyphosis    Lymphadenopathy:    She has no cervical adenopathy.  Neurological: Coordination normal.  Pleasantly confused at her baseline   Skin: Skin is warm and dry. No rash noted. No erythema. No pallor.  Skin intact   Psychiatric: She has a normal mood and affect.   Labs reviewed:  Recent Labs  07/18/16 09/23/16 01/23/17  NA 139 138 136*  K 4.0 4.0 4.0  BUN 17 15 18   CREATININE 0.6 0.6 0.7    Recent Labs  07/01/16 09/23/16  AST 15 14  ALT 16 15  ALKPHOS 39 35    Recent Labs  07/01/16 09/23/16  WBC 6.0 5.4  HGB 13.3 11.5*  HCT 40 34*  PLT 194 160   Lab Results  Component Value Date   TSH 2.65 12/26/2016   Lab Results  Component Value Date   HGBA1C 6.7 07/18/2016   Lab Results  Component Value Date   CHOL 146 01/23/2017   HDL 39 01/23/2017   LDLCALC 76 01/23/2017   TRIG 214 (A) 01/23/2017    Significant Diagnostic Results in last 30 days:  No results found.  Assessment/Plan 1. Hypothyroidism, unspecified type Lab Results  Component Value Date   TSH 2.65 12/26/2016  Continue on Levothyroxine 50 mcg tablet daily. Monitor TSH level.   2. Essential hypertension B/p stable. Continue on amlodipine 10 mg tablet daily, Cozaar 100 mg tablet daily. On ASA EC 81 mg daily for chest pain prophylaxis.LDL at goal no statin due to advance age. Monitor CMP.    3. Vitamin D deficiency vit D level 19 ( 01/17/2017).continue on vit D 1000 units daily. Awaiting Vit D level as ordered in previous visit 01/24/2017.   4. Senile osteoporosis Spinal Kyphosis. No recent fractures. Continue on vit D supplements and Evista 60 mg daily. Continue to monitor.   5. History of CVA (cerebrovascular accident) Continue on ASA EC 81 mg tablet,amlodipine 10 mg tablet daily and Cozaar 100 mg tablet daily. LDL at goal on 01/23/2017.   Family/ staff Communication: Reviewed plan of care with patient and facility Nurse supervisor   Labs/tests ordered: None   Sandrea Hughs, NP

## 2017-03-14 NOTE — Addendum Note (Signed)
Addended by: Royann Shivers A on: 03/14/2017 03:20 PM   Modules accepted: Orders

## 2017-03-20 ENCOUNTER — Encounter: Payer: Self-pay | Admitting: *Deleted

## 2017-03-20 DIAGNOSIS — M6281 Muscle weakness (generalized): Secondary | ICD-10-CM | POA: Diagnosis not present

## 2017-03-20 DIAGNOSIS — E559 Vitamin D deficiency, unspecified: Secondary | ICD-10-CM | POA: Diagnosis not present

## 2017-03-20 DIAGNOSIS — D510 Vitamin B12 deficiency anemia due to intrinsic factor deficiency: Secondary | ICD-10-CM | POA: Diagnosis not present

## 2017-03-20 LAB — VITAMIN D 25 HYDROXY (VIT D DEFICIENCY, FRACTURES): Vit D, 25-Hydroxy: 16

## 2017-03-26 ENCOUNTER — Encounter: Payer: Self-pay | Admitting: Internal Medicine

## 2017-03-26 ENCOUNTER — Non-Acute Institutional Stay (SKILLED_NURSING_FACILITY): Payer: Medicare Other | Admitting: Internal Medicine

## 2017-03-26 DIAGNOSIS — I10 Essential (primary) hypertension: Secondary | ICD-10-CM | POA: Diagnosis not present

## 2017-03-26 DIAGNOSIS — E871 Hypo-osmolality and hyponatremia: Secondary | ICD-10-CM

## 2017-03-26 DIAGNOSIS — D649 Anemia, unspecified: Secondary | ICD-10-CM | POA: Diagnosis not present

## 2017-03-26 DIAGNOSIS — M810 Age-related osteoporosis without current pathological fracture: Secondary | ICD-10-CM | POA: Diagnosis not present

## 2017-03-26 DIAGNOSIS — Z8673 Personal history of transient ischemic attack (TIA), and cerebral infarction without residual deficits: Secondary | ICD-10-CM

## 2017-03-26 NOTE — Progress Notes (Signed)
Location:  [facility] Room Number: 2 Place of Service:  SNF (31) Provider:  Blanchie Serve MD  Blanchie Serve, MD  Patient Care Team: Blanchie Serve, MD as PCP - General (Internal Medicine) Mast, Man X, NP as Nurse Practitioner (Nurse Practitioner) Charlotte Crumb, MD as Consulting Physician (Orthopedic Surgery) Wallene Huh, DPM as Consulting Physician (Podiatry) Gaynelle Arabian, MD as Consulting Physician (Orthopedic Surgery) Zebedee Iba., MD as Referring Physician (Ophthalmology) Newman Pies, MD as Consulting Physician (Neurosurgery) Melina Modena, [facility] (Skilled Nursing and [place])  Extended Emergency Contact Information Primary Emergency Contact: [place] of [place] Phone: [phone] Mobile Phone: [phone] Relation: Daughter Secondary Emergency Contact: [place] of [place] Phone: [phone] Relation: Grandson  Code Status:  DNR  Goals of care: Advanced Directive information Advanced Directives 03/26/2017  Does Patient Have a Medical Advance Directive? Yes  Type of Paramedic of [place];Out of facility DNR (pink MOST or yellow form);Living will  Does patient want to make changes to medical advance directive? No - Patient declined  Copy of [place] in Chart? Yes  Pre-existing out of facility DNR order (yellow form or pink MOST form) Yellow form placed in chart (order not valid for inpatient use);Pink MOST form placed in chart (order not valid for inpatient use)     Chief Complaint  Patient presents with  . Medical Management of Chronic Issues    Routine Visit     HPI:  Pt is a 83 y.o. female seen today for medical management of chronic diseases. She has been at her baseline per nursing. Takes her medications. No fall reported. No pressure ulcer reported. Limited participation from patient with her dementia. She is  tolerating osteoporosis medication well. Elevated BP reading of 177/99 with rest of BP stable. Currently on amlodipine, metoprolol and losartan. She is hard of hearing and has poor safety awareness.    Past Medical History:  Diagnosis Date  . Arthritis   . Cataracts, bilateral   . Closed fracture of unspecified part of lower end of humerus   . Dementia   . Hypertension   . Insomnia, unspecified   . Macular degeneration (senile) of retina, unspecified   . Osteoarthritis   . Osteoarthrosis, unspecified whether generalized or localized, unspecified site   . Pancreatitis, acute 08/14/2013  . Retinal neovascularization NOS   . Senile osteoporosis   . Subarachnoid hemorrhage following injury ([place]) 03/31/2012  . Unspecified hearing loss   . Unspecified hypothyroidism   . Urinary tract infection 08/10/2013  . Vertebral fracture 08/09/2013   T7 10%, T12 70%    Past Surgical History:  Procedure Laterality Date  . ABDOMINAL HYSTERECTOMY  1942  . ANKLE FRACTURE SURGERY  1960  . BLADDER SUSPENSION      No Known Allergies  Outpatient Encounter Medications as of 03/26/2017  Medication Sig  . acetaminophen (TYLENOL) 500 MG tablet Take 500 mg by mouth every 6 (six) hours as needed. Pain  . amLODipine (NORVASC) 10 MG tablet Take 10 mg by mouth daily.  Marland Kitchen aspirin EC 81 MG tablet Take 81 mg by mouth daily.  . Cholecalciferol 1000 units capsule Take 1,000 Units by mouth daily.  Marland Kitchen ketoconazole (NIZORAL) 2 % shampoo Apply 1 application topically. Shampoo once a week on Wednesday  . levothyroxine (SYNTHROID, LEVOTHROID) 50 MCG tablet Take 50 mcg by mouth daily before breakfast.  . LORazepam (ATIVAN) 1 MG tablet Take 1 mg by  mouth as needed for anxiety (Give 1 hour prior to dental appointment).  . losartan (COZAAR) 100 MG tablet Take 100 mg by mouth daily.  . metoprolol (LOPRESSOR) 100 MG tablet Take 100 mg by mouth 2 (two) times daily.   . raloxifene (EVISTA) 60 MG tablet Take 60 mg by mouth daily.    Marland Kitchen senna-docusate (SENOKOT-S) 8.6-50 MG per tablet Take 2 tablets by mouth daily as needed.    No facility-administered encounter medications on file as of 03/26/2017.     Review of Systems  Reason unable to perform ROS: limited with her dementia.  Constitutional: Negative for appetite change, chills and fever.  HENT: Negative for congestion and trouble swallowing.   Respiratory: Negative for cough and shortness of breath.   Cardiovascular: Negative for chest pain.  Gastrointestinal: Negative for abdominal pain, nausea and vomiting.  Skin: Negative for rash.  Neurological: Negative for dizziness and headaches.  Psychiatric/Behavioral: Positive for confusion.    Immunization History  Administered Date(s) Administered  . Influenza-Unspecified 03/02/2013, 02/15/2014, 02/16/2015, 02/22/2016, 02/20/2017  . PPD Test 03/23/2012  . Pneumococcal Conjugate-13 12/17/2016  . Pneumococcal Polysaccharide-23 09/22/2002  . Td 11/17/2002  . Tdap 12/16/2016   Pertinent  Health Maintenance Due  Topic Date Due  . DEXA SCAN  05/13/2017 (Originally 12/29/1983)  . INFLUENZA VACCINE  Completed  . PNA vac Low Risk Adult  Completed   Fall Risk  12/13/2016 02/07/2015 02/22/2014 02/22/2014  Falls in the past year? No No Yes No  Number falls in past yr: - - 1 -  Risk for fall due to : - - History of fall(s) -   Functional Status Survey:    Vitals:   03/26/17 1127  BP: 138/64  Pulse: 80  Resp: 18  Temp: 97.8 F (36.6 C)  TempSrc: Oral  SpO2: 95%  Weight: 150 lb 12.8 oz (68.4 kg)  Height: 5\' 1"  (1.549 m)   Body mass index is 28.49 kg/m.   Wt Readings from Last 3 Encounters:  03/26/17 150 lb 12.8 oz (68.4 kg)  02/19/17 150 lb 9.6 oz (68.3 kg)  01/24/17 151 lb (68.5 kg)   Physical Exam  Constitutional: She appears well-developed. No distress.  Overweight   HENT:  Head: Normocephalic and atraumatic.  Mouth/Throat: Oropharynx is clear and moist.  Eyes: Pupils are equal, round, and  reactive to light.  Neck: Neck supple.  Cardiovascular: Normal rate and regular rhythm.  Pulmonary/Chest: Effort normal and breath sounds normal. She has no wheezes.  Abdominal: Soft. Bowel sounds are normal.  Genitourinary:  Genitourinary Comments: incontinent  Musculoskeletal:  Can move all 4 extremities, trace leg edema, limited ROM with shoulders, need assistance with transfers, uses walker  Lymphadenopathy:    She has no cervical adenopathy.  Neurological: She is alert.  Oriented to self only  Skin: Skin is warm and dry. She is not diaphoretic.    Labs reviewed: Recent Labs    07/18/16 09/23/16 01/23/17  NA 139 138 136*  K 4.0 4.0 4.0  BUN 17 15 18   CREATININE 0.6 0.6 0.7   Recent Labs    07/01/16 09/23/16  AST 15 14  ALT 16 15  ALKPHOS 39 35   Recent Labs    07/01/16 09/23/16  WBC 6.0 5.4  HGB 13.3 11.5*  HCT 40 34*  PLT 194 160   Lab Results  Component Value Date   TSH 2.65 12/26/2016   Lab Results  Component Value Date   HGBA1C 6.7 07/18/2016   Lab Results  Component Value Date   CHOL 146 01/23/2017   HDL 39 01/23/2017   LDLCALC 76 01/23/2017   TRIG 214 (A) 01/23/2017    Significant Diagnostic Results in last 30 days:  No results found.  Assessment/Plan  History of CVA With cognitive impairment. Continue aspirin ec 81 mg daily  Essential HTN 3 readings for review in last 1 month. Currently on losartan, amlodipine and metoprolol. Given a reading of 177/99 and her history of CVA, check BP 3 days a week for next 1 month to reassess. Continue aspirin  Hyponatremia Monitor bmp periodically, maintain hydration and oral intake. Reviewed med list.   Anemia unspecified With her history of HTN, age and being on aspirin, likely has anemia of chronic disease. Monitor cbc periodically. Check cbc and ferritin this visit.   Senile osteoporosis Continue raloxifene and vitamin d supplement   Family/ staff Communication: reviewed care plan with  patient's charge nurse  Labs/tests ordered:  Cbc, bmp, ferritin   Blanchie Serve, MD Internal Medicine [address], [place] [address] Cell Phone (Monday-Friday 8 am - 5 pm): [phone] On Call: [phone] and follow prompts after 5 pm and on weekends Office Phone: [phone] Office Fax: [phone]

## 2017-03-27 DIAGNOSIS — R778 Other specified abnormalities of plasma proteins: Secondary | ICD-10-CM | POA: Diagnosis not present

## 2017-03-27 DIAGNOSIS — F0280 Dementia in other diseases classified elsewhere without behavioral disturbance: Secondary | ICD-10-CM | POA: Diagnosis not present

## 2017-03-27 DIAGNOSIS — R7989 Other specified abnormal findings of blood chemistry: Secondary | ICD-10-CM | POA: Diagnosis not present

## 2017-03-27 DIAGNOSIS — I10 Essential (primary) hypertension: Secondary | ICD-10-CM | POA: Diagnosis not present

## 2017-03-27 DIAGNOSIS — M6281 Muscle weakness (generalized): Secondary | ICD-10-CM | POA: Diagnosis not present

## 2017-03-27 LAB — BASIC METABOLIC PANEL
BUN: 19 (ref 4–21)
CREATININE: 0.6 (ref 0.5–1.1)
GLUCOSE: 174
Potassium: 4.1 (ref 3.4–5.3)
Sodium: 139 (ref 137–147)

## 2017-03-27 LAB — CBC AND DIFFERENTIAL
HCT: 38 (ref 36–46)
Hemoglobin: 13.2 (ref 12.0–16.0)
PLATELETS: 180 (ref 150–399)
WBC: 5.8

## 2017-04-02 ENCOUNTER — Emergency Department (HOSPITAL_COMMUNITY)
Admission: EM | Admit: 2017-04-02 | Discharge: 2017-04-03 | Disposition: A | Discharge status: Home / Self Care | Payer: Medicare Other | Attending: Emergency Medicine | Admitting: Emergency Medicine

## 2017-04-02 ENCOUNTER — Emergency Department (HOSPITAL_COMMUNITY): Payer: Medicare Other

## 2017-04-02 ENCOUNTER — Encounter (HOSPITAL_COMMUNITY): Payer: Self-pay

## 2017-04-02 DIAGNOSIS — F0390 Unspecified dementia without behavioral disturbance: Secondary | ICD-10-CM | POA: Diagnosis not present

## 2017-04-02 DIAGNOSIS — S0190XA Unspecified open wound of unspecified part of head, initial encounter: Secondary | ICD-10-CM | POA: Diagnosis not present

## 2017-04-02 DIAGNOSIS — R51 Headache: Secondary | ICD-10-CM | POA: Diagnosis not present

## 2017-04-02 DIAGNOSIS — I10 Essential (primary) hypertension: Secondary | ICD-10-CM | POA: Diagnosis not present

## 2017-04-02 DIAGNOSIS — E039 Hypothyroidism, unspecified: Secondary | ICD-10-CM | POA: Insufficient documentation

## 2017-04-02 DIAGNOSIS — Z7982 Long term (current) use of aspirin: Secondary | ICD-10-CM | POA: Diagnosis not present

## 2017-04-02 DIAGNOSIS — S0101XA Laceration without foreign body of scalp, initial encounter: Secondary | ICD-10-CM | POA: Diagnosis not present

## 2017-04-02 DIAGNOSIS — M542 Cervicalgia: Secondary | ICD-10-CM | POA: Diagnosis not present

## 2017-04-02 DIAGNOSIS — Y999 Unspecified external cause status: Secondary | ICD-10-CM | POA: Diagnosis not present

## 2017-04-02 DIAGNOSIS — Y92129 Unspecified place in nursing home as the place of occurrence of the external cause: Secondary | ICD-10-CM | POA: Insufficient documentation

## 2017-04-02 DIAGNOSIS — W19XXXA Unspecified fall, initial encounter: Secondary | ICD-10-CM

## 2017-04-02 DIAGNOSIS — S0990XA Unspecified injury of head, initial encounter: Secondary | ICD-10-CM | POA: Diagnosis not present

## 2017-04-02 DIAGNOSIS — Y939 Activity, unspecified: Secondary | ICD-10-CM | POA: Diagnosis not present

## 2017-04-02 DIAGNOSIS — S098XXA Other specified injuries of head, initial encounter: Secondary | ICD-10-CM | POA: Diagnosis present

## 2017-04-02 DIAGNOSIS — W1830XA Fall on same level, unspecified, initial encounter: Secondary | ICD-10-CM | POA: Diagnosis not present

## 2017-04-02 DIAGNOSIS — S199XXA Unspecified injury of neck, initial encounter: Secondary | ICD-10-CM | POA: Diagnosis not present

## 2017-04-02 MED ORDER — LIDOCAINE-EPINEPHRINE-TETRACAINE (LET) SOLUTION
3.0000 mL | Freq: Once | NASAL | Status: AC
  Administered 2017-04-02: 3 mL via TOPICAL
  Filled 2017-04-02: qty 3

## 2017-04-02 MED ORDER — LIDOCAINE-EPINEPHRINE (PF) 2 %-1:200000 IJ SOLN
10.0000 mL | Freq: Once | INTRAMUSCULAR | Status: AC
  Administered 2017-04-02: 10 mL via INTRADERMAL
  Filled 2017-04-02: qty 20

## 2017-04-02 NOTE — ED Notes (Signed)
Bed: LP53 Expected date:  Expected time:  Means of arrival:  Comments: 83 yr old fall head laceration

## 2017-04-02 NOTE — ED Triage Notes (Signed)
Pt had a witnessed fall from [facility], pt has a head laceration to the right side of her forehead, bleeding controlled at the present No blood thinners and no LOC

## 2017-04-03 NOTE — ED Provider Notes (Signed)
[facility] DEPT Provider Note   CSN: [id number] Arrival date & time: 04/02/17  2021     History   Chief Complaint Chief Complaint  Patient presents with  . Fall    HPI Melissa Carroll is a 83 y.o. female.  HPI   83yo female presents with concern for fall at [facility] skilled nursing.  Mechanical fall was witnessed. Had head laceration. No LOC. Not on blood thinners. Patient denies pain at other locations.  Does not remember fall.  History is limited by demential.   Past Medical History:  Diagnosis Date  . Arthritis   . Cataracts, bilateral   . Closed fracture of unspecified part of lower end of humerus   . Dementia   . Hypertension   . Insomnia, unspecified   . Macular degeneration (senile) of retina, unspecified   . Osteoarthritis   . Osteoarthrosis, unspecified whether generalized or localized, unspecified site   . Pancreatitis, acute 08/14/2013  . Retinal neovascularization NOS   . Senile osteoporosis   . Subarachnoid hemorrhage following injury ([place]) 03/31/2012  . Unspecified hearing loss   . Unspecified hypothyroidism   . Urinary tract infection 08/10/2013  . Vertebral fracture 08/09/2013   T7 10%, T12 70%     Patient Active Problem List   Diagnosis Date Noted  . Vitamin D deficiency 02/19/2017  . Osteoporosis with pathological fracture 01/22/2017  . Vascular dementia with behavior disturbance 01/22/2017  . Unsteady gait 01/22/2017  . History of CVA (cerebrovascular accident) 11/25/2016  . Falls frequently 11/25/2016  . Cerebrovascular disease 07/15/2016  . Cerebral atrophy 07/15/2016  . Meningioma ([place]) 07/15/2016  . Scalp laceration, subsequent encounter 06/28/2016  . Urine incontinence 01/04/2016  . Hypertensive retinopathy 01/24/2015  . Posterior vitreous detachment 01/24/2015  . Pseudoaphakia 01/24/2015  . Chorioretinal scar, macular 10/04/2014  . Lower back pain 07/19/2014  . Memory deficit 01/07/2014  . GERD  (gastroesophageal reflux disease) 09/14/2013  . Musculoskeletal pain 08/14/2013  . Hyperglycemia 08/14/2013  . Senile osteoporosis 08/10/2013  . Fracture, vertebra, pathologic 08/09/2013  . AMD (age-related macular degeneration), wet ([place]) 02/18/2013  . Constipation 09/11/2012  . Hypothyroidism 08/14/2012  . Osteoarthritis 08/14/2012  . Insomnia 08/14/2012  . Hypertension 04/02/2012  . History of fall 03/31/2012  . Hard of hearing 03/31/2012  . Subarachnoid hemorrhage following injury ([place]) 03/31/2012    Past Surgical History:  Procedure Laterality Date  . ABDOMINAL HYSTERECTOMY  1942  . ANKLE FRACTURE SURGERY  1960  . BLADDER SUSPENSION    . CLOSED REDUCTION WITH HUMERAL PIN INSERTION  04/01/2012   Procedure: CLOSED REDUCTION WITH HUMERAL PIN INSERTION;  Surgeon: Schuyler Amor, MD;  Location: [facility] ORS;  Service: Orthopedics;  Laterality: Left;    OB History    No data available       Home Medications    Prior to Admission medications   Medication Sig Start Date End Date Taking? Authorizing Provider  acetaminophen (TYLENOL) 500 MG tablet Take 500 mg by mouth every 6 (six) hours as needed. Pain   Yes [provider]  amLODipine (NORVASC) 10 MG tablet Take 10 mg by mouth daily.   Yes [provider]  aspirin EC 81 MG tablet Take 81 mg by mouth daily.   Yes [provider]  Cholecalciferol 1000 units capsule Take 1,000 Units by mouth daily.   Yes [provider]  levothyroxine (SYNTHROID, LEVOTHROID) 50 MCG tablet Take 50 mcg by mouth daily before breakfast.   Yes [provider]  LORazepam (ATIVAN) 1 MG tablet Take 1 mg by mouth as needed for anxiety (Give 1 hour prior to dental appointment).   Yes [provider]  losartan (COZAAR) 100 MG tablet Take 100 mg by mouth daily.   Yes [provider]  metoprolol (LOPRESSOR) 100 MG tablet Take 100 mg by mouth 2 (two) times daily.    Yes [provider]    raloxifene (EVISTA) 60 MG tablet Take 60 mg by mouth daily.   Yes [provider]  senna-docusate (SENOKOT-S) 8.6-50 MG per tablet Take 2 tablets by mouth at bedtime as needed for moderate constipation.    Yes [provider]    Family History Family History  Problem Relation Age of Onset  . Hypertension Mother     Social History Social History   Tobacco Use  . Smoking status: Never Smoker  . Smokeless tobacco: Never Used  Substance Use Topics  . Alcohol use: No  . Drug use: No     Allergies   Patient has no known allergies.   Review of Systems Review of Systems  Unable to perform ROS: Dementia  Constitutional: Negative for fever.  HENT: Negative for sore throat.   Respiratory: Negative for cough and shortness of breath.   Cardiovascular: Negative for chest pain.  Gastrointestinal: Negative for abdominal pain.  Genitourinary: Negative for difficulty urinating.  Musculoskeletal: Negative for neck pain.  Skin: Positive for wound. Negative for rash.  Neurological: Negative for syncope and headaches.     Physical Exam Updated Vital Signs BP (!) 190/74 (BP Location: Left Arm)   Pulse 71  Temp 97.9 F (36.6 C) (Oral)   Resp 18   SpO2 94%   Physical Exam  Constitutional: She is oriented to person, place, and time. She appears well-developed and well-nourished. No distress.  HENT:  Head: Normocephalic and atraumatic.  Eyes: Conjunctivae and EOM are normal.  Neck: Normal range of motion.  Cardiovascular: Normal rate, regular rhythm, normal heart sounds and intact distal pulses. Exam reveals no gallop and no friction rub.  No murmur heard. Pulmonary/Chest: Effort normal and breath sounds normal. No respiratory distress. She has no wheezes. She has no rales.  Abdominal: Soft. She exhibits no distension. There is no tenderness. There is no guarding.  Musculoskeletal: She exhibits no edema or tenderness.  Neurological: She is alert and oriented to  person, place, and time.  Skin: Skin is warm and dry. No rash noted. She is not diaphoretic. No erythema.  Laceration 3cm posterior scalp  Nursing note and vitals reviewed.    ED Treatments / Results  Labs (all labs ordered are listed, but only abnormal results are displayed) Labs Reviewed - No data to display  EKG  EKG Interpretation None       Radiology Ct Head Wo Contrast  Result Date: 04/02/2017 CLINICAL DATA:  Posttraumatic head and neck pain after fall. EXAM: CT HEAD WITHOUT CONTRAST CT CERVICAL SPINE WITHOUT CONTRAST TECHNIQUE: Multidetector CT imaging of the head and cervical spine was performed following the standard protocol without intravenous contrast. Multiplanar CT image reconstructions of the cervical spine were also generated. COMPARISON:  09/19/2016 FINDINGS: CT HEAD FINDINGS BRAIN: There is moderate sulcal and ventricular prominence consistent with superficial and central atrophy. No intraparenchymal hemorrhage, mass effect nor midline shift. Moderate degree of periventricular and subcortical white matter hypodensity consistent with chronic microvascular ischemia. No acute large vascular territory infarcts. No abnormal extra-axial fluid collections. Basal cisterns are not effaced and midline. VASCULAR: Moderate  calcific atherosclerosis of the carotid siphons. SKULL: No skull fracture. No significant scalp soft tissue swelling. SINUSES/ORBITS: The mastoid air-cells are clear. The included paranasal sinuses are well-aerated.The included ocular globes and orbital contents are non-suspicious. OTHER: None. CT CERVICAL SPINE FINDINGS Alignment: Maintained cervical lordosis. Osteoarthritis of the atlantodental interval. Intact craniocervical relationship. Skull base and vertebrae: No acute cervical spine fracture or listhesis. Intact skullbase. Soft tissues and spinal canal: No prevertebral fluid or swelling. No visible canal hematoma. Disc levels: Cervical spondylosis with moderate  to marked disc space narrowing from C4 through C7 with small posterior marginal osteophytes and uncovertebral joint osteoarthritis contributing to bilateral mild neural foraminal encroachment. Upper chest: Chronic stable apical pleuroparenchymal scarring and thickening. Other: None IMPRESSION: 1. No acute intracranial abnormality. Atrophy with extensive chronic microvascular ischemic disease of white matter. 2. Cervical spondylosis without acute cervical spine fracture. Electronically Signed   By: Ashley Royalty M.D.   On: 04/02/2017 21:58   Ct Cervical Spine Wo Contrast  Result Date: 04/02/2017 CLINICAL DATA:  Posttraumatic head and neck pain after fall. EXAM: CT HEAD WITHOUT CONTRAST CT CERVICAL SPINE WITHOUT CONTRAST TECHNIQUE: Multidetector CT imaging of the head and cervical spine was performed following the standard protocol without intravenous contrast. Multiplanar CT image reconstructions of the cervical spine were also generated. COMPARISON:  09/19/2016 FINDINGS: CT HEAD FINDINGS BRAIN: There is moderate sulcal and ventricular prominence consistent with superficial and central atrophy. No intraparenchymal hemorrhage, mass effect nor midline shift. Moderate degree of periventricular and subcortical white matter hypodensity consistent with chronic microvascular ischemia. No acute large vascular territory infarcts. No abnormal extra-axial fluid collections. Basal cisterns are not effaced and midline. VASCULAR: Moderate calcific atherosclerosis of the carotid siphons. SKULL: No skull fracture. No significant scalp soft tissue swelling. SINUSES/ORBITS: The mastoid air-cells are clear. The included paranasal sinuses are well-aerated.The included ocular globes and orbital contents are non-suspicious. OTHER: None. CT CERVICAL SPINE FINDINGS Alignment: Maintained cervical lordosis. Osteoarthritis of the atlantodental interval. Intact craniocervical relationship. Skull base and vertebrae: No acute cervical spine  fracture or listhesis. Intact skullbase. Soft tissues and spinal canal: No prevertebral fluid or swelling. No visible canal hematoma. Disc levels: Cervical spondylosis with moderate to marked disc space narrowing from C4 through C7 with small posterior marginal osteophytes and uncovertebral joint osteoarthritis contributing to bilateral mild neural foraminal encroachment. Upper chest: Chronic stable apical pleuroparenchymal scarring and thickening. Other: None IMPRESSION: 1. No acute intracranial abnormality. Atrophy with extensive chronic microvascular ischemic disease of white matter. 2. Cervical spondylosis without acute cervical spine fracture. Electronically Signed   By: Ashley Royalty M.D.   On: 04/02/2017 21:58    Procedures .Marland KitchenLaceration Repair Date/Time: 04/03/2017 2:34 PM Performed by: Gareth Morgan, MD Authorized by: Gareth Morgan, MD   Consent:    Consent obtained:  Verbal   Consent given by:  Healthcare agent   Risks discussed:  Pain, infection and need for additional repair   Alternatives discussed:  No treatment Laceration details:    Location:  Scalp   Scalp location:  Occipital   Length (cm):  3 Repair type:    Repair type:  Simple Pre-procedure details:    Preparation:  Patient was prepped and draped in usual sterile fashion Exploration:    Contaminated: no   Treatment:    Area cleansed with:  Saline   Amount of cleaning:  Standard   Irrigation solution:  Sterile saline   Irrigation method:  Syringe Skin repair:    Repair method:  Staples   Number of  staples:  2 Post-procedure details:    Patient tolerance of procedure:  Tolerated well, no immediate complications   (including critical care time)  Medications Ordered in ED Medications  lidocaine-EPINEPHrine (XYLOCAINE W/EPI) 2 %-1:200000 (PF) injection 10 mL (10 mLs Intradermal Given 04/02/17 2150)  lidocaine-EPINEPHrine-tetracaine (LET) solution (3 mLs Topical Given 04/02/17 2304)     Initial Impression  / Assessment and Plan / ED Course  I have reviewed the triage vital signs and the nursing notes.  Pertinent labs & imaging results that were available during my care of the patient were reviewed by me and considered in my medical decision making (see chart for details).     83yo female with history above including dementia presents with concern for mechanical fall with head laceration. CT head and CSPine without acute abnormalities. Normal ROM extremities with good strength, no sign of other injuries by history or exam.  Laceration repaired using LET gel and staples. Patient discharged in stable condition with understanding of reasons to return.   Final Clinical Impressions(s) / ED Diagnoses   Final diagnoses:  Fall, initial encounter  Laceration of scalp without foreign body, initial encounter    ED Discharge Orders    None       Gareth Morgan, MD 04/03/17 1437

## 2017-04-03 NOTE — Discharge Instructions (Signed)
Keep the laceration dry for the first 24-48 hours.  You may place bacitracin over the wound and follow up for recheck with your physician.  Staples should be removed in 7-10 days.

## 2017-04-03 NOTE — ED Notes (Addendum)
Pts DNR and MOST form left in patients room, [facility] called and notified. Family returned and picked it up to bring back to the facility.

## 2017-04-10 ENCOUNTER — Encounter: Payer: Self-pay | Admitting: Family

## 2017-04-10 ENCOUNTER — Non-Acute Institutional Stay (SKILLED_NURSING_FACILITY): Payer: Medicare Other | Admitting: Family

## 2017-04-10 DIAGNOSIS — S0101XD Laceration without foreign body of scalp, subsequent encounter: Secondary | ICD-10-CM | POA: Diagnosis not present

## 2017-04-10 NOTE — Progress Notes (Signed)
Location:  [place] Room Number: 2 Place of Service:  SNF (31) Provider: Dinah Ngetich FNP-C  Blanchie Serve, MD  Patient Care Team: Blanchie Serve, MD as PCP - General (Internal Medicine) Mast, Man X, NP as Nurse Practitioner (Nurse Practitioner) Charlotte Crumb, MD as Consulting Physician (Orthopedic Surgery) Wallene Huh, DPM as Consulting Physician (Podiatry) Gaynelle Arabian, MD as Consulting Physician (Orthopedic Surgery) Zebedee Iba., MD as Referring Physician (Ophthalmology) Newman Pies, MD as Consulting Physician (Neurosurgery) Melina Modena, [facility] [place]  Extended Emergency Contact Information Primary Emergency Contact: Samson of [place] Phone: [phone] Mobile Phone: [phone] Relation: Daughter Secondary Emergency Contact: Liberal of [place] Phone: [phone] Relation: Grandson  Code Status:  DNR Goals of care: Advanced Directive information Advanced Directives 04/10/2017  Does Patient Have a Medical Advance Directive? Yes  Type of Advance Directive Out of facility DNR (pink MOST or yellow form);Living will;Healthcare Power of Attorney  Does patient want to make changes to medical advance directive? -  Copy of [place] in Chart? Yes  Pre-existing out of facility DNR order (yellow form or pink MOST form) Yellow form placed in chart (order not valid for inpatient use);Pink MOST form placed in chart (order not valid for inpatient use)     Chief Complaint  Patient presents with  . Acute Visit    check staples to see if ok to remove. 8 days post placement    HPI:  Pt is a 83 y.o. female seen today at [facility] for an acute visit for evaluation of staples on the head. She is status post ED visit 04/02/2017 post mechanical fall resulting to head laceration.she denies any pain to laceration site though HPI  and ROS limited due to presence of dementia.facility Nurse reports no fever, chills, swelling or drainage from staple sites.     Past Medical History:  Diagnosis Date  . Arthritis   . Cataracts, bilateral   . Closed fracture of unspecified part of lower end of humerus   . Dementia   . Hypertension   . Insomnia, unspecified   . Macular degeneration (senile) of retina, unspecified   . Osteoarthritis   . Osteoarthrosis, unspecified whether generalized or localized, unspecified site   . Pancreatitis, acute 08/14/2013  . Retinal neovascularization NOS   . Senile osteoporosis   . Subarachnoid hemorrhage following injury ([place]) 03/31/2012  . Unspecified hearing loss   . Unspecified hypothyroidism   . Urinary tract infection 08/10/2013  . Vertebral fracture 08/09/2013   T7 10%, T12 70%    Past Surgical History:  Procedure Laterality Date  . ABDOMINAL HYSTERECTOMY  1942  . ANKLE FRACTURE SURGERY  1960  . BLADDER SUSPENSION    . CLOSED REDUCTION WITH HUMERAL PIN INSERTION  04/01/2012   Procedure: CLOSED REDUCTION WITH HUMERAL PIN INSERTION;  Surgeon: Schuyler Amor, MD;  Location: [facility] ORS;  Service: Orthopedics;  Laterality: Left;    No Known Allergies  Outpatient Encounter Medications as of 04/10/2017  Medication Sig  . acetaminophen (TYLENOL) 500 MG tablet Take 500 mg by mouth every 6 (six) hours as needed. Pain  . amLODipine (NORVASC) 10 MG tablet Take 10 mg by mouth daily.  Marland Kitchen aspirin EC 81 MG tablet Take 81 mg by mouth daily.  . Cholecalciferol 1000 units capsule Take 1,000 Units by mouth daily.  Marland Kitchen ketoconazole (NIZORAL) 2 % shampoo Apply 1 application topically once a week. On  Wednesday  . levothyroxine (SYNTHROID, LEVOTHROID) 50 MCG tablet Take 50 mcg by mouth daily before breakfast.  . LORazepam (ATIVAN) 1 MG tablet Take 1 mg by mouth as needed for anxiety (Give 1 hour prior to dental appointment).  . losartan (COZAAR) 100 MG tablet Take 100 mg by mouth daily.  . metoprolol  (LOPRESSOR) 100 MG tablet Take 100 mg by mouth 2 (two) times daily.   . raloxifene (EVISTA) 60 MG tablet Take 60 mg by mouth daily.  Marland Kitchen senna-docusate (SENOKOT-S) 8.6-50 MG per tablet Take 2 tablets by mouth at bedtime as needed for moderate constipation.    No facility-administered encounter medications on file as of 04/10/2017.     Review of Systems  Unable to perform ROS: Dementia    Immunization History  Administered Date(s) Administered  . Influenza-Unspecified 03/02/2013, 02/15/2014, 02/16/2015, 02/22/2016, 02/20/2017  . PPD Test 03/23/2012  . Pneumococcal Conjugate-13 12/17/2016  . Pneumococcal Polysaccharide-23 09/22/2002  . Td 11/17/2002  . Tdap 12/16/2016   Pertinent  Health Maintenance Due  Topic Date Due  . DEXA SCAN  05/13/2017 (Originally [birth year]/1983)  . INFLUENZA VACCINE  Completed  . PNA vac Low Risk Adult  Completed   Fall Risk  12/13/2016 02/07/2015 02/22/2014 02/22/2014  Falls in the past year? No No Yes No  Number falls in past yr: - - 1 -  Risk for fall due to : - - History of fall(s) -    Vitals:   04/10/17 1520  BP: 104/70  Pulse: 70  Resp: 14  Temp: 97.7 F (36.5 C)  SpO2: 93%  Weight: 150 lb 12.8 oz (68.4 kg)  Height: 5\' 1"  (1.549 m)   Body mass index is 28.49 kg/m. Physical Exam  Constitutional: She appears well-developed and well-nourished.  Elderly   HENT:  Head: Normocephalic.  Mouth/Throat: Oropharynx is clear and moist. No oropharyngeal exudate.  Right crown laceration site with x 2 staples scalp skin with dry blood. No redness, swelling or drainage noted. Non-tender to touch.   Eyes: Conjunctivae and EOM are normal. Pupils are equal, round, and reactive to light. Right eye exhibits no discharge. Left eye exhibits no discharge. No scleral icterus.  Neck: Normal range of motion. No JVD present. No thyromegaly present.  Cardiovascular: Normal rate, regular rhythm, normal heart sounds and intact distal pulses. Exam reveals no gallop and no  friction rub.  No murmur heard. Pulmonary/Chest: Effort normal and breath sounds normal. No respiratory distress. She has no wheezes. She has no rales.  Abdominal: Soft. Bowel sounds are normal. She exhibits no distension. There is no tenderness. There is no rebound and no guarding.  Neurological: Coordination normal.  Pleasantly confused at baseline   Skin: Skin is warm and dry. No rash noted. No erythema.  Right crown laceration site with x 2 staples scalp skin with dry blood. No redness, swelling or drainage noted. Non-tender to touch.   Psychiatric: She has a normal mood and affect.    Labs reviewed: Recent Labs    07/18/16 09/23/16 01/23/17  NA 139 138 136*  K 4.0 4.0 4.0  BUN 17 15 18   CREATININE 0.6 0.6 0.7   Recent Labs    07/01/16 09/23/16  AST 15 14  ALT 16 15  ALKPHOS 39 35   Recent Labs    07/01/16 09/23/16  WBC 6.0 5.4  HGB 13.3 11.5*  HCT 40 34*  PLT 194 160   Lab Results  Component Value Date   TSH 2.65 12/26/2016   Lab  Results  Component Value Date   HGBA1C 6.7 07/18/2016   Lab Results  Component Value Date   CHOL 146 01/23/2017   HDL 39 01/23/2017   LDLCALC 76 01/23/2017   TRIG 214 (A) 01/23/2017    Significant Diagnostic Results in last 30 days:  Ct Head Wo Contrast  Result Date: 04/02/2017 CLINICAL DATA:  Posttraumatic head and neck pain after fall. EXAM: CT HEAD WITHOUT CONTRAST CT CERVICAL SPINE WITHOUT CONTRAST TECHNIQUE: Multidetector CT imaging of the head and cervical spine was performed following the standard protocol without intravenous contrast. Multiplanar CT image reconstructions of the cervical spine were also generated. COMPARISON:  09/19/2016 FINDINGS: CT HEAD FINDINGS BRAIN: There is moderate sulcal and ventricular prominence consistent with superficial and central atrophy. No intraparenchymal hemorrhage, mass effect nor midline shift. Moderate degree of periventricular and subcortical white matter hypodensity consistent with  chronic microvascular ischemia. No acute large vascular territory infarcts. No abnormal extra-axial fluid collections. Basal cisterns are not effaced and midline. VASCULAR: Moderate calcific atherosclerosis of the carotid siphons. SKULL: No skull fracture. No significant scalp soft tissue swelling. SINUSES/ORBITS: The mastoid air-cells are clear. The included paranasal sinuses are well-aerated.The included ocular globes and orbital contents are non-suspicious. OTHER: None. CT CERVICAL SPINE FINDINGS Alignment: Maintained cervical lordosis. Osteoarthritis of the atlantodental interval. Intact craniocervical relationship. Skull base and vertebrae: No acute cervical spine fracture or listhesis. Intact skullbase. Soft tissues and spinal canal: No prevertebral fluid or swelling. No visible canal hematoma. Disc levels: Cervical spondylosis with moderate to marked disc space narrowing from C4 through C7 with small posterior marginal osteophytes and uncovertebral joint osteoarthritis contributing to bilateral mild neural foraminal encroachment. Upper chest: Chronic stable apical pleuroparenchymal scarring and thickening. Other: None IMPRESSION: 1. No acute intracranial abnormality. Atrophy with extensive chronic microvascular ischemic disease of white matter. 2. Cervical spondylosis without acute cervical spine fracture. Electronically Signed   By: Ashley Royalty M.D.   On: 04/02/2017 21:58   Ct Cervical Spine Wo Contrast  Result Date: 04/02/2017 CLINICAL DATA:  Posttraumatic head and neck pain after fall. EXAM: CT HEAD WITHOUT CONTRAST CT CERVICAL SPINE WITHOUT CONTRAST TECHNIQUE: Multidetector CT imaging of the head and cervical spine was performed following the standard protocol without intravenous contrast. Multiplanar CT image reconstructions of the cervical spine were also generated. COMPARISON:  09/19/2016 FINDINGS: CT HEAD FINDINGS BRAIN: There is moderate sulcal and ventricular prominence consistent with  superficial and central atrophy. No intraparenchymal hemorrhage, mass effect nor midline shift. Moderate degree of periventricular and subcortical white matter hypodensity consistent with chronic microvascular ischemia. No acute large vascular territory infarcts. No abnormal extra-axial fluid collections. Basal cisterns are not effaced and midline. VASCULAR: Moderate calcific atherosclerosis of the carotid siphons. SKULL: No skull fracture. No significant scalp soft tissue swelling. SINUSES/ORBITS: The mastoid air-cells are clear. The included paranasal sinuses are well-aerated.The included ocular globes and orbital contents are non-suspicious. OTHER: None. CT CERVICAL SPINE FINDINGS Alignment: Maintained cervical lordosis. Osteoarthritis of the atlantodental interval. Intact craniocervical relationship. Skull base and vertebrae: No acute cervical spine fracture or listhesis. Intact skullbase. Soft tissues and spinal canal: No prevertebral fluid or swelling. No visible canal hematoma. Disc levels: Cervical spondylosis with moderate to marked disc space narrowing from C4 through C7 with small posterior marginal osteophytes and uncovertebral joint osteoarthritis contributing to bilateral mild neural foraminal encroachment. Upper chest: Chronic stable apical pleuroparenchymal scarring and thickening. Other: None IMPRESSION: 1. No acute intracranial abnormality. Atrophy with extensive chronic microvascular ischemic disease of white matter. 2. Cervical spondylosis  without acute cervical spine fracture. Electronically Signed   By: Ashley Royalty M.D.   On: 04/02/2017 21:58    Assessment/Plan  Scalp laceration, subsequent encounter Afebrile. Right crown laceration site with x 2 staples scalp skin with dry blood. No redness, swelling or drainage noted. Non-tender to touch. Remove staples and monitor site. Notify provider for any signs of redness, swelling, tenderness or drainage.   Family/ staff Communication: Reviewed  plan of care with patient and facility Nurse.   Labs/tests ordered: None   Dinah C Ngetich, NP

## 2017-04-23 DIAGNOSIS — L853 Xerosis cutis: Secondary | ICD-10-CM | POA: Diagnosis not present

## 2017-04-23 DIAGNOSIS — L814 Other melanin hyperpigmentation: Secondary | ICD-10-CM | POA: Diagnosis not present

## 2017-04-30 ENCOUNTER — Encounter: Payer: Self-pay | Admitting: Family

## 2017-04-30 ENCOUNTER — Non-Acute Institutional Stay (SKILLED_NURSING_FACILITY): Payer: Medicare Other | Admitting: Family

## 2017-04-30 DIAGNOSIS — H6121 Impacted cerumen, right ear: Secondary | ICD-10-CM

## 2017-04-30 DIAGNOSIS — F0151 Vascular dementia with behavioral disturbance: Secondary | ICD-10-CM | POA: Diagnosis not present

## 2017-04-30 DIAGNOSIS — M159 Polyosteoarthritis, unspecified: Secondary | ICD-10-CM

## 2017-04-30 DIAGNOSIS — E039 Hypothyroidism, unspecified: Secondary | ICD-10-CM

## 2017-04-30 DIAGNOSIS — F01518 Vascular dementia, unspecified severity, with other behavioral disturbance: Secondary | ICD-10-CM

## 2017-04-30 DIAGNOSIS — M150 Primary generalized (osteo)arthritis: Secondary | ICD-10-CM | POA: Diagnosis not present

## 2017-04-30 DIAGNOSIS — I10 Essential (primary) hypertension: Secondary | ICD-10-CM | POA: Diagnosis not present

## 2017-04-30 DIAGNOSIS — K5901 Slow transit constipation: Secondary | ICD-10-CM

## 2017-04-30 NOTE — Progress Notes (Signed)
Location:  [facility] Room Number: 2 Place of Service:  SNF (31) Provider: Shealeigh Dunstan FNP-C   Blanchie Serve, MD  Patient Care Team: Blanchie Serve, MD as PCP - General (Internal Medicine) Mast, Man X, NP as Nurse Practitioner (Nurse Practitioner) Charlotte Crumb, MD as Consulting Physician (Orthopedic Surgery) Wallene Huh, DPM as Consulting Physician (Podiatry) Gaynelle Arabian, MD as Consulting Physician (Orthopedic Surgery) Zebedee Iba., MD as Referring Physician (Ophthalmology) Newman Pies, MD as Consulting Physician (Neurosurgery) Melina Modena, [facility] (Skilled Nursing and [place])  Extended Emergency Contact Information Primary Emergency Contact: [place] of [place] Phone: [phone] Mobile Phone: [phone] Relation: Daughter Secondary Emergency Contact: [place] of [place] Phone: [phone] Relation: Grandson  Code Status: DNR Goals of care: Advanced Directive information Advanced Directives 04/30/2017  Does Patient Have a Medical Advance Directive? Yes  Type of Paramedic of [place];Out of facility DNR (pink MOST or yellow form);Living will  Does patient want to make changes to medical advance directive? -  Copy of [place] in Chart? Yes  Pre-existing out of facility DNR order (yellow form or pink MOST form) Yellow form placed in chart (order not valid for inpatient use);Pink MOST form placed in chart (order not valid for inpatient use)     Chief Complaint  Patient presents with  . Medical Management of Chronic Issues    routine visit    HPI:  Pt is a 83 y.o. female seen today [place] for medical management of chronic diseases.She has a medical history of HTN,Hypothyroidism,OA,Vascular dementia with behavior disturbance among other conditions.she is seen in her room today.she denies any acute issues.No  recent fall episode reported.she has had a 2.6 lbs weight loss over one month.Facility Nurse reports patient has good appetite.   Past Medical History:  Diagnosis Date  . Arthritis   . Cataracts, bilateral   . Closed fracture of unspecified part of lower end of humerus   . Dementia   . Hypertension   . Insomnia, unspecified   . Macular degeneration (senile) of retina, unspecified   . Osteoarthritis   . Osteoarthrosis, unspecified whether generalized or localized, unspecified site   . Pancreatitis, acute 08/14/2013  . Retinal neovascularization NOS   . Senile osteoporosis   . Subarachnoid hemorrhage following injury ([place]) 03/31/2012  . Unspecified hearing loss   . Unspecified hypothyroidism   . Urinary tract infection 08/10/2013  . Vertebral fracture 08/09/2013   T7 10%, T12 70%    Past Surgical History:  Procedure Laterality Date  . ABDOMINAL HYSTERECTOMY  1942  . ANKLE FRACTURE SURGERY  1960  . BLADDER SUSPENSION    . CLOSED REDUCTION WITH HUMERAL PIN INSERTION  04/01/2012   Procedure: CLOSED REDUCTION WITH HUMERAL PIN INSERTION;  Surgeon: Schuyler Amor, MD;  Location: [facility];  Service: Orthopedics;  Laterality: Left;    No Known Allergies  Allergies as of 04/30/2017   No Known Allergies     Medication List        Accurate as of 04/30/17  3:41 PM. Always use your most recent med list.          acetaminophen 500 MG tablet Commonly known as:  TYLENOL Take 500 mg by mouth every 6 (six) hours as needed. Pain   amLODipine 10 MG tablet Commonly known as:  NORVASC Take 10 mg by mouth daily.   aspirin EC 81 MG tablet Take 81 mg by  mouth daily.   Cholecalciferol 1000 units capsule Take 1,000 Units by mouth daily.   ketoconazole 2 % shampoo Commonly known as:  NIZORAL Apply 1 application topically once a week. On Wednesday   levothyroxine 50 MCG tablet Commonly known as:  SYNTHROID, LEVOTHROID Take 50 mcg by mouth daily before breakfast.   LORazepam 1 MG  tablet Commonly known as:  ATIVAN Take 1 mg by mouth as needed for anxiety (Give 1 hour prior to dental appointment).   losartan 100 MG tablet Commonly known as:  COZAAR Take 100 mg by mouth daily.   metoprolol tartrate 100 MG tablet Commonly known as:  LOPRESSOR Take 100 mg by mouth 2 (two) times daily.   raloxifene 60 MG tablet Commonly known as:  EVISTA Take 60 mg by mouth daily.   senna-docusate 8.6-50 MG tablet Commonly known as:  Senokot-S Take 2 tablets by mouth at bedtime as needed for moderate constipation.       Review of Systems  Unable to perform ROS: Dementia    Immunization History  Administered Date(s) Administered  . Influenza-Unspecified 03/02/2013, 02/15/2014, 02/16/2015, 02/22/2016, 02/20/2017  . PPD Test 03/23/2012  . Pneumococcal Conjugate-13 12/17/2016  . Pneumococcal Polysaccharide-23 09/22/2002  . Td 11/17/2002  . Tdap 12/16/2016   Pertinent  Health Maintenance Due  Topic Date Due  . DEXA SCAN  05/13/2017 (Originally [birth year]/1983)  . INFLUENZA VACCINE  Completed  . PNA vac Low Risk Adult  Completed   Fall Risk  12/13/2016 02/07/2015 02/22/2014 02/22/2014  Falls in the past year? No No Yes No  Number falls in past yr: - - 1 -  Risk for fall due to : - - History of fall(s) -    Vitals:   04/30/17 1347  BP: (!) 162/86  Pulse: 77  Resp: 15  Temp: (!) 96 F (35.6 C)  SpO2: 97%  Weight: 148 lb 3.2 oz (67.2 kg)  Height: 5\' 1"  (1.549 m)   Body mass index is 28 kg/m. Physical Exam  Constitutional: She appears well-developed and well-nourished.  Elderly in no acute distress   HENT:  Head: Normocephalic and atraumatic.  Left Ear: External ear normal.  HOH.Right ear cerumen impaction noted.TM not visualized   Eyes: Conjunctivae and EOM are normal. Pupils are equal, round, and reactive to light. Right eye exhibits no discharge. Left eye exhibits no discharge. No scleral icterus.  Neck: Normal range of motion. No JVD present. No thyromegaly  present.  Cardiovascular: Normal rate, regular rhythm, normal heart sounds and intact distal pulses. Exam reveals no gallop and no friction rub.  No murmur heard. Pulmonary/Chest: Effort normal and breath sounds normal. No respiratory distress. She has no wheezes. She has no rales. She exhibits no tenderness.  Abdominal: Soft. Bowel sounds are normal. She exhibits no distension. There is no tenderness. There is no rebound and no guarding.  LBM 04/29/2017  Genitourinary:  Genitourinary Comments: Incontinent   Musculoskeletal: She exhibits no edema or tenderness.  Unsteady gait uses FWW  Lymphadenopathy:    She has no cervical adenopathy.  Neurological: She is alert. Coordination normal.  Pleasantly confused at her baseline follows directions during visit.   Skin: Skin is warm and dry. No rash noted. No erythema.  Psychiatric: She has a normal mood and affect.   Labs reviewed: Recent Labs    07/18/16 09/23/16 01/23/17  NA 139 138 136*  K 4.0 4.0 4.0  BUN 17 15 18   CREATININE 0.6 0.6 0.7   Recent Labs  07/01/16 09/23/16  AST 15 14  ALT 16 15  ALKPHOS 39 35   Recent Labs    07/01/16 09/23/16  WBC 6.0 5.4  HGB 13.3 11.5*  HCT 40 34*  PLT 194 160   Lab Results  Component Value Date   TSH 2.65 12/26/2016   Lab Results  Component Value Date   HGBA1C 6.7 07/18/2016   Lab Results  Component Value Date   CHOL 146 01/23/2017   HDL 39 01/23/2017   LDLCALC 76 01/23/2017   TRIG 214 (A) 01/23/2017    Significant Diagnostic Results in last 30 days:  Ct Head Wo Contrast  Result Date: 04/02/2017 CLINICAL DATA:  Posttraumatic head and neck pain after fall. EXAM: CT HEAD WITHOUT CONTRAST CT CERVICAL SPINE WITHOUT CONTRAST TECHNIQUE: Multidetector CT imaging of the head and cervical spine was performed following the standard protocol without intravenous contrast. Multiplanar CT image reconstructions of the cervical spine were also generated. COMPARISON:  09/19/2016 FINDINGS: CT  HEAD FINDINGS BRAIN: There is moderate sulcal and ventricular prominence consistent with superficial and central atrophy. No intraparenchymal hemorrhage, mass effect nor midline shift. Moderate degree of periventricular and subcortical white matter hypodensity consistent with chronic microvascular ischemia. No acute large vascular territory infarcts. No abnormal extra-axial fluid collections. Basal cisterns are not effaced and midline. VASCULAR: Moderate calcific atherosclerosis of the carotid siphons. SKULL: No skull fracture. No significant scalp soft tissue swelling. SINUSES/ORBITS: The mastoid air-cells are clear. The included paranasal sinuses are well-aerated.The included ocular globes and orbital contents are non-suspicious. OTHER: None. CT CERVICAL SPINE FINDINGS Alignment: Maintained cervical lordosis. Osteoarthritis of the atlantodental interval. Intact craniocervical relationship. Skull base and vertebrae: No acute cervical spine fracture or listhesis. Intact skullbase. Soft tissues and spinal canal: No prevertebral fluid or swelling. No visible canal hematoma. Disc levels: Cervical spondylosis with moderate to marked disc space narrowing from C4 through C7 with small posterior marginal osteophytes and uncovertebral joint osteoarthritis contributing to bilateral mild neural foraminal encroachment. Upper chest: Chronic stable apical pleuroparenchymal scarring and thickening. Other: None IMPRESSION: 1. No acute intracranial abnormality. Atrophy with extensive chronic microvascular ischemic disease of white matter. 2. Cervical spondylosis without acute cervical spine fracture. Electronically Signed   By: Ashley Royalty M.D.   On: 04/02/2017 21:58   Ct Cervical Spine Wo Contrast  Result Date: 04/02/2017 CLINICAL DATA:  Posttraumatic head and neck pain after fall. EXAM: CT HEAD WITHOUT CONTRAST CT CERVICAL SPINE WITHOUT CONTRAST TECHNIQUE: Multidetector CT imaging of the head and cervical spine was performed  following the standard protocol without intravenous contrast. Multiplanar CT image reconstructions of the cervical spine were also generated. COMPARISON:  09/19/2016 FINDINGS: CT HEAD FINDINGS BRAIN: There is moderate sulcal and ventricular prominence consistent with superficial and central atrophy. No intraparenchymal hemorrhage, mass effect nor midline shift. Moderate degree of periventricular and subcortical white matter hypodensity consistent with chronic microvascular ischemia. No acute large vascular territory infarcts. No abnormal extra-axial fluid collections. Basal cisterns are not effaced and midline. VASCULAR: Moderate calcific atherosclerosis of the carotid siphons. SKULL: No skull fracture. No significant scalp soft tissue swelling. SINUSES/ORBITS: The mastoid air-cells are clear. The included paranasal sinuses are well-aerated.The included ocular globes and orbital contents are non-suspicious. OTHER: None. CT CERVICAL SPINE FINDINGS Alignment: Maintained cervical lordosis. Osteoarthritis of the atlantodental interval. Intact craniocervical relationship. Skull base and vertebrae: No acute cervical spine fracture or listhesis. Intact skullbase. Soft tissues and spinal canal: No prevertebral fluid or swelling. No visible canal hematoma. Disc levels: Cervical spondylosis with moderate  to marked disc space narrowing from C4 through C7 with small posterior marginal osteophytes and uncovertebral joint osteoarthritis contributing to bilateral mild neural foraminal encroachment. Upper chest: Chronic stable apical pleuroparenchymal scarring and thickening. Other: None IMPRESSION: 1. No acute intracranial abnormality. Atrophy with extensive chronic microvascular ischemic disease of white matter. 2. Cervical spondylosis without acute cervical spine fracture. Electronically Signed   By: Ashley Royalty M.D.   On: 04/02/2017 21:58   Assessment/Plan 1. Essential hypertension B/p log reviewed SBP readings in the  110's-130's with few episodes occasionally in the 160's.will continue on amlodipine 10 mg tablet daily,losartan 100 mg tablet daily and metoprolol 100 mg tablet twice daily.continue to monitor.   2. hypothyroidism Lab Results  Component Value Date   TSH 2.65 12/26/2016  Continue on levothyroxine 50 mcg tablet daily. Continue to monitor TSH level.   3. Impacted cerumen of right ear Right ear soft cerumen impaction.HOH TM not visualized.Facility Nurse to lavage right ear with warm water.  4. Slow transit constipation Current regimen effective.   5. Primary osteoarthritis involving multiple joints Continue on tylenol as needed.continue to encourage ambulation with walker.    6. Vascular dementia with behavior disturbance No new behavioral issues reported. Continue to assist with ADL's.Fall and safety precautions.   7. Weight loss  Has had a 2.6 lbs weight loss over one month.Facility Nurse reports patient has good appetite.Continue to follow up with Regiestered Dietician. Consider protein supplements if continues to loss weight.     Family/ staff Communication: Reviewed plan of care with patient and facility Nurse.   Labs/tests ordered: None   Koji Niehoff C Kelsha Older, NP

## 2017-05-30 ENCOUNTER — Encounter: Payer: Self-pay | Admitting: Internal Medicine

## 2017-05-30 ENCOUNTER — Non-Acute Institutional Stay (SKILLED_NURSING_FACILITY): Payer: Medicare Other | Admitting: Internal Medicine

## 2017-05-30 DIAGNOSIS — E039 Hypothyroidism, unspecified: Secondary | ICD-10-CM | POA: Diagnosis not present

## 2017-05-30 DIAGNOSIS — E785 Hyperlipidemia, unspecified: Secondary | ICD-10-CM | POA: Insufficient documentation

## 2017-05-30 DIAGNOSIS — M810 Age-related osteoporosis without current pathological fracture: Secondary | ICD-10-CM

## 2017-05-30 DIAGNOSIS — F0151 Vascular dementia with behavioral disturbance: Secondary | ICD-10-CM

## 2017-05-30 DIAGNOSIS — F01518 Vascular dementia, unspecified severity, with other behavioral disturbance: Secondary | ICD-10-CM

## 2017-05-30 NOTE — Progress Notes (Signed)
Location:  [facility] Room Number: 2 Place of Service:  SNF (31) Provider:  Blanchie Serve MD  Blanchie Serve, MD  Patient Care Team: Blanchie Serve, MD as PCP - General (Internal Medicine) Mast, Man X, NP as Nurse Practitioner (Nurse Practitioner) Charlotte Crumb, MD as Consulting Physician (Orthopedic Surgery) Wallene Huh, DPM as Consulting Physician (Podiatry) Gaynelle Arabian, MD as Consulting Physician (Orthopedic Surgery) Zebedee Iba., MD as Referring Physician (Ophthalmology) Newman Pies, MD as Consulting Physician (Neurosurgery) Melina Modena, [facility] (Skilled Nursing and [place])  Extended Emergency Contact Information Primary Emergency Contact: [place] of [place] Phone: [phone] Mobile Phone: [phone] Relation: Daughter Secondary Emergency Contact: [place] of [place] Phone: [phone] Relation: Grandson  Code Status:  DNR  Goals of care: Advanced Directive information Advanced Directives 05/30/2017  Does Patient Have a Medical Advance Directive? Yes  Type of Advance Directive Living will;Healthcare Power of [place];Out of facility DNR (pink MOST or yellow form)  Does patient want to make changes to medical advance directive? No - Patient declined  Copy of [place] in Chart? Yes  Pre-existing out of facility DNR order (yellow form or pink MOST form) Yellow form placed in chart (order not valid for inpatient use);Pink MOST form placed in chart (order not valid for inpatient use)     Chief Complaint  Patient presents with  . Medical Management of Chronic Issues    Routine Visit     HPI:  Pt is a 84 y.o. female seen today for medical management of chronic diseases. She has been at her baseline per nursing. No new concerns from nursing. She has occassional episode of agitation, requiring ativan and there are times when redirection and  calming helps her. Patient denies any concern this visit. She is pleasantly confused and very hard of hearing. She has lost her hearing aid. Weight is stable.    Past Medical History:  Diagnosis Date  . Arthritis   . Cataracts, bilateral   . Closed fracture of unspecified part of lower end of humerus   . Dementia   . Hypertension   . Insomnia, unspecified   . Macular degeneration (senile) of retina, unspecified   . Osteoarthritis   . Osteoarthrosis, unspecified whether generalized or localized, unspecified site   . Pancreatitis, acute 08/14/2013  . Retinal neovascularization NOS   . Senile osteoporosis   . Subarachnoid hemorrhage following injury ([place]) 03/31/2012  . Unspecified hearing loss   . Unspecified hypothyroidism   . Urinary tract infection 08/10/2013  . Vertebral fracture 08/09/2013   T7 10%, T12 70%    Past Surgical History:  Procedure Laterality Date  . ABDOMINAL HYSTERECTOMY  1942  . ANKLE FRACTURE SURGERY  1960  . BLADDER SUSPENSION    . CLOSED REDUCTION WITH HUMERAL PIN INSERTION  04/01/2012   Procedure: CLOSED REDUCTION WITH HUMERAL PIN INSERTION;  Surgeon: Schuyler Amor, MD;  Location: [facility];  Service: Orthopedics;  Laterality: Left;    No Known Allergies  Outpatient Encounter Medications as of 05/30/2017  Medication Sig  . acetaminophen (TYLENOL) 500 MG tablet Take 500 mg by mouth every 6 (six) hours as needed. Pain  . amLODipine (NORVASC) 10 MG tablet Take 10 mg by mouth daily.  Marland Kitchen aspirin EC 81 MG tablet Take 81 mg by mouth daily.  . Cholecalciferol 1000 units capsule Take 2,000 Units by mouth daily.   Marland Kitchen ketoconazole (NIZORAL) 2 % shampoo Apply 1 application topically  once a week. On Wednesday  . levothyroxine (SYNTHROID, LEVOTHROID) 50 MCG tablet Take 50 mcg by mouth daily before breakfast.  . LORazepam (ATIVAN) 1 MG tablet Take 1 mg by mouth as needed for anxiety (Give 1 hour prior to dental appointment).  . losartan (COZAAR) 100 MG tablet Take 100 mg  by mouth daily.  . metoprolol (LOPRESSOR) 100 MG tablet Take 100 mg by mouth 2 (two) times daily.   . raloxifene (EVISTA) 60 MG tablet Take 60 mg by mouth daily.  Marland Kitchen senna-docusate (SENOKOT-S) 8.6-50 MG per tablet Take 2 tablets by mouth at bedtime as needed for moderate constipation.    No facility-administered encounter medications on file as of 05/30/2017.     Review of Systems  Unable to perform ROS: Dementia (hard of hearing)  Constitutional: Negative for appetite change, chills and fever.  HENT: Positive for hearing loss. Negative for congestion, mouth sores, rhinorrhea and sore throat.   Respiratory: Negative for cough and shortness of breath.   Cardiovascular: Negative for chest pain.  Gastrointestinal: Negative for abdominal pain, nausea and vomiting.  Genitourinary: Negative for dysuria.  Musculoskeletal: Positive for gait problem.       Unsteady gait, needs assistance with transfers, no fall reported   Skin: Negative for rash.  Neurological: Negative for dizziness and headaches.  Psychiatric/Behavioral: Positive for confusion.    Immunization History  Administered Date(s) Administered  . Influenza-Unspecified 03/02/2013, 02/15/2014, 02/16/2015, 02/22/2016, 02/20/2017  . PPD Test 03/23/2012  . Pneumococcal Conjugate-13 12/17/2016  . Pneumococcal Polysaccharide-23 09/22/2002  . Td 11/17/2002  . Tdap 12/16/2016   Pertinent  Health Maintenance Due  Topic Date Due  . DEXA SCAN  12/29/1983  . INFLUENZA VACCINE  Completed  . PNA vac Low Risk Adult  Completed   Fall Risk  12/13/2016 02/07/2015 02/22/2014 02/22/2014  Falls in the past year? No No Yes No  Number falls in past yr: - - 1 -  Risk for fall due to : - - History of fall(s) -   Functional Status Survey:    Vitals:   05/30/17 1129  BP: 130/60  Pulse: 80  Resp: 18  Temp: (!) 97.2 F (36.2 C)  TempSrc: Oral  SpO2: 97%  Weight: 149 lb 9.6 oz (67.9 kg)  Height: 5' 1.5" (1.562 m)   Body mass index is 27.81  kg/m.   Wt Readings from Last 3 Encounters:  05/30/17 149 lb 9.6 oz (67.9 kg)  04/30/17 148 lb 3.2 oz (67.2 kg)  04/10/17 150 lb 12.8 oz (68.4 kg)   Physical Exam  Constitutional: No distress.  overweight  HENT:  Head: Normocephalic and atraumatic.  Mouth/Throat: Oropharynx is clear and moist.  Eyes: Conjunctivae are normal. Right eye exhibits no discharge. Left eye exhibits no discharge.  Neck: Neck supple.  Cardiovascular: Normal rate and regular rhythm.  Pulmonary/Chest: Effort normal and breath sounds normal. She has no wheezes. She has no rales.  Abdominal: Soft. Bowel sounds are normal. There is no tenderness. There is no guarding.  Has urinary incontinence  Musculoskeletal: She exhibits edema and deformity.  Unsteady gait, walker for ambulation  Lymphadenopathy:    She has no cervical adenopathy.  Neurological: She is alert.  Oriented to self only  Skin: Skin is warm and dry. She is not diaphoretic.  Psychiatric: She has a normal mood and affect.    Labs reviewed: Recent Labs    09/23/16 01/23/17 03/27/17  NA 138 136* 139  K 4.0 4.0 4.1  BUN 15 18 19  CREATININE 0.6 0.7 0.6   Recent Labs    07/01/16 09/23/16  AST 15 14  ALT 16 15  ALKPHOS 39 35   Recent Labs    07/01/16 09/23/16 03/27/17  WBC 6.0 5.4 5.8  HGB 13.3 11.5* 13.2  HCT 40 34* 38  PLT 194 160 180   Lab Results  Component Value Date   TSH 2.65 12/26/2016   Lab Results  Component Value Date   HGBA1C 6.7 07/18/2016   Lab Results  Component Value Date   CHOL 146 01/23/2017   HDL 39 01/23/2017   LDLCALC 76 01/23/2017   TRIG 214 (A) 01/23/2017    Significant Diagnostic Results in last 30 days:  No results found.  Assessment/Plan  Acquired Hypothyroidism Lab Results  Component Value Date   TSH 2.65 12/26/2016   Check TSH. C/w levothyroxine 50 mcg daily for now  Hyperlipidemia Elevated TG. With her advanced age and dementia, benefit from staring statin in questionable. monitor  clinically  Vascular dementia Occasional behavioral disturbance. Continue supportive care, antihypertensive and aspirin.  Osteoporosis No recent fall or fracture reported. Continue raloxifene 60 mg daily. Continue vitamin d supplement.    Family/ staff Communication: reviewed care plan with patient and charge nurse.    Labs/tests ordered:  TSH   Blanchie Serve, MD Internal Medicine [facility] [address] [place], [place] [address] Cell Phone (Monday-Friday 8 am - 5 pm): [phone] On Call: [phone] and follow prompts after 5 pm and on weekends Office Phone: [phone] Office Fax: [phone]

## 2017-06-26 ENCOUNTER — Encounter: Payer: Self-pay | Admitting: Family

## 2017-06-26 ENCOUNTER — Non-Acute Institutional Stay (SKILLED_NURSING_FACILITY): Payer: Medicare Other | Admitting: Family

## 2017-06-26 DIAGNOSIS — F01518 Vascular dementia, unspecified severity, with other behavioral disturbance: Secondary | ICD-10-CM

## 2017-06-26 DIAGNOSIS — F0151 Vascular dementia with behavioral disturbance: Secondary | ICD-10-CM

## 2017-06-26 DIAGNOSIS — K5901 Slow transit constipation: Secondary | ICD-10-CM | POA: Diagnosis not present

## 2017-06-26 DIAGNOSIS — E039 Hypothyroidism, unspecified: Secondary | ICD-10-CM | POA: Diagnosis not present

## 2017-06-26 DIAGNOSIS — I10 Essential (primary) hypertension: Secondary | ICD-10-CM | POA: Diagnosis not present

## 2017-06-26 LAB — TSH: TSH: 4.85 (ref 0.41–5.90)

## 2017-06-26 NOTE — Progress Notes (Signed)
Location:  [place] Room Number: 2 Place of Service:  SNF (31) Provider: Katherleen Folkes FNP-C   Blanchie Serve, MD  Patient Care Team: Blanchie Serve, MD as PCP - General (Internal Medicine) Mast, Man X, NP as Nurse Practitioner (Nurse Practitioner) Charlotte Crumb, MD as Consulting Physician (Orthopedic Surgery) Wallene Huh, DPM as Consulting Physician (Podiatry) Gaynelle Arabian, MD as Consulting Physician (Orthopedic Surgery) Zebedee Iba., MD as Referring Physician (Ophthalmology) Newman Pies, MD as Consulting Physician (Neurosurgery) Melina Modena, [facility] (Skilled Nursing and [place])  Extended Emergency Contact Information Primary Emergency Contact: [place] Phone: [phone] Mobile Phone: [phone] Relation: Daughter Secondary Emergency Contact: [place] Phone: [phone] Relation: Grandson  Code Status:  DNR Goals of care: Advanced Directive information Advanced Directives 06/26/2017  Does Patient Have a Medical Advance Directive? Yes  Type of Paramedic of [place];Out of facility DNR (pink MOST or yellow form);Living will  Does patient want to make changes to medical advance directive? -  Copy of [place] in Chart? Yes  Pre-existing out of facility DNR order (yellow form or pink MOST form) Yellow form placed in chart (order not valid for inpatient use);Pink MOST form placed in chart (order not valid for inpatient use)     Chief Complaint  Patient presents with  . Medical Management of Chronic Issues    routine visit    HPI:  Pt is a 84 y.o. female seen today [place] for medical management of chronic diseases.she is seen in her room today with facility Nurse present at bedside.she denies any acute issues during visit though HPI limited due to her cognitive impairment.Facility nurse  reports patient appetite is good and no recent fall episodes.she continues to walk using her walker and requires assistance with her ADL's. Her weight has been stable.    Past Medical History:  Diagnosis Date  . Arthritis   . Cataracts, bilateral   . Closed fracture of unspecified part of lower end of humerus   . Dementia   . Hypertension   . Insomnia, unspecified   . Macular degeneration (senile) of retina, unspecified   . Osteoarthritis   . Osteoarthrosis, unspecified whether generalized or localized, unspecified site   . Pancreatitis, acute 08/14/2013  . Retinal neovascularization NOS   . Senile osteoporosis   . Subarachnoid hemorrhage following injury ([place]) 03/31/2012  . Unspecified hearing loss   . Unspecified hypothyroidism   . Urinary tract infection 08/10/2013  . Vertebral fracture 08/09/2013   T7 10%, T12 70%    Past Surgical History:  Procedure Laterality Date  . ABDOMINAL HYSTERECTOMY  1942  . ANKLE FRACTURE SURGERY  1960  . BLADDER SUSPENSION    . CLOSED REDUCTION WITH HUMERAL PIN INSERTION  04/01/2012   Procedure: CLOSED REDUCTION WITH HUMERAL PIN INSERTION;  Surgeon: Schuyler Amor, MD;  Location: [facility];  Service: Orthopedics;  Laterality: Left;    No Known Allergies  Allergies as of 06/26/2017   No Known Allergies     Medication List        Accurate as of 06/26/17  5:41 PM. Always use your most recent med list.          acetaminophen 500 MG tablet Commonly known as:  TYLENOL Take 500 mg by mouth every 6 (six) hours as needed. Pain   amLODipine 10 MG tablet Commonly known as:  NORVASC Take 10 mg by  mouth daily.   aspirin EC 81 MG tablet Take 81 mg by mouth daily.   Cholecalciferol 1000 units capsule Take 2,000 Units by mouth daily.   ketoconazole 2 % shampoo Commonly known as:  NIZORAL Apply 1 application topically once a week. On Wednesday   levothyroxine 50 MCG tablet Commonly known as:  SYNTHROID, LEVOTHROID Take 50 mcg by mouth daily  before breakfast.   LORazepam 1 MG tablet Commonly known as:  ATIVAN Take 1 mg by mouth as needed for anxiety (Give 1 hour prior to dental appointment).   losartan 100 MG tablet Commonly known as:  COZAAR Take 100 mg by mouth daily.   metoprolol tartrate 100 MG tablet Commonly known as:  LOPRESSOR Take 100 mg by mouth 2 (two) times daily.   raloxifene 60 MG tablet Commonly known as:  EVISTA Take 60 mg by mouth daily.   senna-docusate 8.6-50 MG tablet Commonly known as:  Senokot-S Take 2 tablets by mouth at bedtime as needed for moderate constipation.       Review of Systems  Unable to perform ROS: Dementia    Immunization History  Administered Date(s) Administered  . Influenza-Unspecified 03/02/2013, 02/15/2014, 02/16/2015, 02/22/2016, 02/20/2017  . PPD Test 03/23/2012  . Pneumococcal Conjugate-13 12/17/2016  . Pneumococcal Polysaccharide-23 09/22/2002  . Td 11/17/2002  . Tdap 12/16/2016   Pertinent  Health Maintenance Due  Topic Date Due  . DEXA SCAN  12/29/1983  . INFLUENZA VACCINE  Completed  . PNA vac Low Risk Adult  Completed   Fall Risk  12/13/2016 02/07/2015 02/22/2014 02/22/2014  Falls in the past year? No No Yes No  Number falls in past yr: - - 1 -  Risk for fall due to : - - History of fall(s) -    Vitals:   06/26/17 1120  BP: (!) 167/87  Pulse: 77  Resp: 20  Temp: 98 F (36.7 C)  SpO2: 96%  Weight: 150 lb (68 kg)  Height: 5' 1.5" (1.562 m)   Body mass index is 27.88 kg/m. Physical Exam  Constitutional: She appears well-developed.  Elderly in no acute distress pleasantly confused.   HENT:  Head: Normocephalic.  Mouth/Throat: Oropharynx is clear and moist. No oropharyngeal exudate.  Eyes: Conjunctivae are normal. Pupils are equal, round, and reactive to light. Right eye exhibits no discharge. Left eye exhibits no discharge. No scleral icterus.  Neck: Normal range of motion. No JVD present. No thyromegaly present.  Cardiovascular: Normal  rate, regular rhythm, normal heart sounds and intact distal pulses. Exam reveals no gallop and no friction rub.  No murmur heard. Pulmonary/Chest: Effort normal and breath sounds normal. No respiratory distress. She has no wheezes. She has no rales.  Abdominal: Soft. Bowel sounds are normal. She exhibits no distension. There is no tenderness. There is no rebound and no guarding.  Musculoskeletal: Normal range of motion. She exhibits no edema, tenderness or deformity.  Unsteady gait uses FWW  Lymphadenopathy:    She has no cervical adenopathy.  Neurological: She is alert. Coordination normal.  Pleasantly confused at her baseline   Skin: Skin is warm and dry. No rash noted. No erythema.  Psychiatric: She has a normal mood and affect.  Easily agitated     Labs reviewed: Recent Labs    09/23/16 01/23/17 03/27/17  NA 138 136* 139  K 4.0 4.0 4.1  BUN 15 18 19   CREATININE 0.6 0.7 0.6   Recent Labs    07/01/16 09/23/16  AST 15 14  ALT 16  15  ALKPHOS 39 35   Recent Labs    07/01/16 09/23/16 03/27/17  WBC 6.0 5.4 5.8  HGB 13.3 11.5* 13.2  HCT 40 34* 38  PLT 194 160 180   Lab Results  Component Value Date   TSH 2.65 12/26/2016   Lab Results  Component Value Date   HGBA1C 6.7 07/18/2016   Lab Results  Component Value Date   CHOL 146 01/23/2017   HDL 39 01/23/2017   LDLCALC 76 01/23/2017   TRIG 214 (A) 01/23/2017    Significant Diagnostic Results in last 30 days:  No results found.  Assessment/Plan 1. Hypothyroidism Lab Results  Component Value Date   TSH 2.65 12/26/2016  TSH level previous ordered by MD was drawn prior to visit per facility Nurse awaiting results.continue on levothyroxine 50 mcg tablet daily. Continue to monitor TSH level.   2. Vascular dementia with behavior disturbance No new behavioral issues reported.continues to require assistance with her ADL's.Reorient and redirect as needed.continue fall and safety precautions.continue skin care.continue on  Ativan 1 mg tablet as needed.    3. Essential hypertension SB/p above 150 at times with her agitation. Continue on amlodipine 10 mg tablet daily,metoprolol 100 mg tablet twice daily and Cozaar 100 mg tablet daily.continue to monitor.  4. Slow transit constipation Current regimen effective.continue to encourage oral intake and hydration.continue on senokot-s.   Family/ staff Communication: Reviewed plan of care with patient and facility Nurse.   Labs/tests ordered: None   Zelma Mazariego C Betsie Peckman, NP

## 2017-06-30 ENCOUNTER — Encounter: Payer: Self-pay | Admitting: Family

## 2017-06-30 ENCOUNTER — Non-Acute Institutional Stay (SKILLED_NURSING_FACILITY): Payer: Medicare Other | Admitting: Family

## 2017-06-30 DIAGNOSIS — E039 Hypothyroidism, unspecified: Secondary | ICD-10-CM

## 2017-06-30 NOTE — Progress Notes (Signed)
Location:  [place] Room Number: 2 Place of Service:  SNF (31) Provider: Dinah Ngetich FNP-C  Blanchie Serve, MD  Patient Care Team: Blanchie Serve, MD as PCP - General (Internal Medicine) Mast, Man X, NP as Nurse Practitioner (Nurse Practitioner) Charlotte Crumb, MD as Consulting Physician (Orthopedic Surgery) Wallene Huh, DPM as Consulting Physician (Podiatry) Gaynelle Arabian, MD as Consulting Physician (Orthopedic Surgery) Zebedee Iba., MD as Referring Physician (Ophthalmology) Newman Pies, MD as Consulting Physician (Neurosurgery) Melina Modena, [facility] (Skilled Nursing and [place])  Extended Emergency Contact Information Primary Emergency Contact: [place] of [place] Phone: [phone] Mobile Phone: [phone] Relation: Daughter Secondary Emergency Contact: [place] of [place] Phone: [phone] Relation: Grandson  Code Status:  DNR Goals of care: Advanced Directive information Advanced Directives 06/30/2017  Does Patient Have a Medical Advance Directive? Yes  Type of Paramedic of [place];Out of facility DNR (pink MOST or yellow form);Living will  Does patient want to make changes to medical advance directive? -  Copy of [place] in Chart? Yes  Pre-existing out of facility DNR order (yellow form or pink MOST form) Yellow form placed in chart (order not valid for inpatient use);Pink MOST form placed in chart (order not valid for inpatient use)     Chief Complaint  Patient presents with  . Acute Visit    abnormal lab results    HPI:  Pt is a 84 y.o. female seen today at [facility] for an acute visit for evaluation of abnormal lab results.she is seen in her room today.she denies any acute issues during visit though HPI and ROS limited due to dementia. Facility Nurse reports no new concerns. Her recent TSH level 4.85  (06/26/2017).she is currently on levothyroxine 50 mcg tablet daily.    Past Medical History:  Diagnosis Date  . Arthritis   . Cataracts, bilateral   . Closed fracture of unspecified part of lower end of humerus   . Dementia   . Hypertension   . Insomnia, unspecified   . Macular degeneration (senile) of retina, unspecified   . Osteoarthritis   . Osteoarthrosis, unspecified whether generalized or localized, unspecified site   . Pancreatitis, acute 08/14/2013  . Retinal neovascularization NOS   . Senile osteoporosis   . Subarachnoid hemorrhage following injury ([place]) 03/31/2012  . Unspecified hearing loss   . Unspecified hypothyroidism   . Urinary tract infection 08/10/2013  . Vertebral fracture 08/09/2013   T7 10%, T12 70%    Past Surgical History:  Procedure Laterality Date  . ABDOMINAL HYSTERECTOMY  1942  . ANKLE FRACTURE SURGERY  1960  . BLADDER SUSPENSION    . CLOSED REDUCTION WITH HUMERAL PIN INSERTION  04/01/2012   Procedure: CLOSED REDUCTION WITH HUMERAL PIN INSERTION;  Surgeon: Schuyler Amor, MD;  [facility]: [facility];  Service: Orthopedics;  Laterality: Left;    No Known Allergies  Outpatient Encounter Medications as of 06/30/2017  Medication Sig  . acetaminophen (TYLENOL) 500 MG tablet Take 500 mg by mouth every 6 (six) hours as needed. Pain  . amLODipine (NORVASC) 10 MG tablet Take 10 mg by mouth daily.  Marland Kitchen aspirin EC 81 MG tablet Take 81 mg by mouth daily.  . Cholecalciferol 1000 units capsule Take 2,000 Units by mouth daily.   Marland Kitchen ketoconazole (NIZORAL) 2 % shampoo Apply 1 application topically once a week. On Wednesday  . levothyroxine (SYNTHROID, LEVOTHROID) 50 MCG tablet Take 50  mcg by mouth daily before breakfast.  . LORazepam (ATIVAN) 1 MG tablet Take 1 mg by mouth as needed for anxiety (Give 1 hour prior to dental appointment).  . losartan (COZAAR) 100 MG tablet Take 100 mg by mouth daily.  . metoprolol (LOPRESSOR) 100 MG tablet Take 100 mg by mouth 2 (two) times  daily.   . raloxifene (EVISTA) 60 MG tablet Take 60 mg by mouth daily.  Marland Kitchen senna-docusate (SENOKOT-S) 8.6-50 MG per tablet Take 2 tablets by mouth at bedtime as needed for moderate constipation.    No facility-administered encounter medications on file as of 06/30/2017.     Review of Systems  Unable to perform ROS: Dementia (additional infomation provided by facility Nurse. )    Immunization History  Administered Date(s) Administered  . Influenza-Unspecified 03/02/2013, 02/15/2014, 02/16/2015, 02/22/2016, 02/20/2017  . PPD Test 03/23/2012  . Pneumococcal Conjugate-13 12/17/2016  . Pneumococcal Polysaccharide-23 09/22/2002  . Td 11/17/2002  . Tdap 12/16/2016   Pertinent  Health Maintenance Due  Topic Date Due  . DEXA SCAN  12/29/1983  . INFLUENZA VACCINE  Completed  . PNA vac Low Risk Adult  Completed   Fall Risk  12/13/2016 02/07/2015 02/22/2014 02/22/2014  Falls in the past year? No No Yes No  Number falls in past yr: - - 1 -  Risk for fall due to : - - History of fall(s) -    Vitals:   06/30/17 1330  BP: (!) 167/87  Pulse: 77  Resp: 20  Temp: 98 F (36.7 C)  SpO2: 96%  Weight: 150 lb (68 kg)  Height: 5' 1.5" (1.562 m)   Body mass index is 27.88 kg/m. Physical Exam  Constitutional: She appears well-developed.  Elderly in no acute distress   HENT:  Head: Normocephalic.  Mouth/Throat: Oropharynx is clear and moist. No oropharyngeal exudate.  HOH   Eyes: Conjunctivae and EOM are normal. Pupils are equal, round, and reactive to light. Right eye exhibits no discharge. Left eye exhibits no discharge. No scleral icterus.  Neck: Normal range of motion. No JVD present. No thyromegaly present.  Cardiovascular: Normal rate, regular rhythm, normal heart sounds and intact distal pulses. Exam reveals no gallop and no friction rub.  No murmur heard. Pulmonary/Chest: Effort normal and breath sounds normal. No respiratory distress. She has no wheezes. She has no rales.  Abdominal:  Soft. Bowel sounds are normal. She exhibits no distension. There is no tenderness. There is no rebound and no guarding.  Musculoskeletal: She exhibits no edema or tenderness.  Unsteady gait uses FWW  Lymphadenopathy:    She has no cervical adenopathy.  Neurological: She is alert. Coordination normal.  Pleasantly confused at her baseline.   Skin: Skin is warm and dry. No rash noted. No erythema.  Psychiatric: She has a normal mood and affect. Cognition and memory are impaired.   Labs reviewed: Recent Labs    09/23/16 01/23/17 03/27/17  NA 138 136* 139  K 4.0 4.0 4.1  BUN 15 18 19   CREATININE 0.6 0.7 0.6   Recent Labs    07/01/16 09/23/16  AST 15 14  ALT 16 15  ALKPHOS 39 35   Recent Labs    07/01/16 09/23/16 03/27/17  WBC 6.0 5.4 5.8  HGB 13.3 11.5* 13.2  HCT 40 34* 38  PLT 194 160 180   Lab Results  Component Value Date   TSH 2.65 12/26/2016   Lab Results  Component Value Date   HGBA1C 6.7 07/18/2016   Lab Results  Component Value Date   CHOL 146 01/23/2017   HDL 39 01/23/2017   LDLCALC 76 01/23/2017   TRIG 214 (A) 01/23/2017    Significant Diagnostic Results in last 30 days:  No results found.  Assessment/Plan   Hypothyroidism TSH level 4.85 (06/26/2017).currently on levothyroxine 50 mcg tablet daily.will increase levothyroxine to 75 mcg tablet daily before breakfast.Recheck TSH level in 8 weeks.  Family/ staff Communication: Reviewed plan of care with patient and facility Nurse.   Labs/tests ordered: TSH level in 8 weeks.      Sandrea Hughs, NP

## 2017-07-10 ENCOUNTER — Encounter: Payer: Self-pay | Admitting: Family

## 2017-07-10 ENCOUNTER — Non-Acute Institutional Stay (SKILLED_NURSING_FACILITY): Payer: Medicare Other | Admitting: Family

## 2017-07-10 DIAGNOSIS — T148XXA Other injury of unspecified body region, initial encounter: Secondary | ICD-10-CM

## 2017-07-10 NOTE — Progress Notes (Signed)
Location:  [place] Room Number: 2 Place of Service:  SNF (31) Provider: Dinah Ngetich FNP-C  Blanchie Serve, MD  Patient Care Team: Blanchie Serve, MD as PCP - General (Internal Medicine) Mast, Man X, NP as Nurse Practitioner (Nurse Practitioner) Charlotte Crumb, MD as Consulting Physician (Orthopedic Surgery) Wallene Huh, DPM as Consulting Physician (Podiatry) Gaynelle Arabian, MD as Consulting Physician (Orthopedic Surgery) Zebedee Iba., MD as Referring Physician (Ophthalmology) Newman Pies, MD as Consulting Physician (Neurosurgery) Melina Modena, [facility] (Skilled Nursing and [place])  Extended Emergency Contact Information Primary Emergency Contact: [place] of [place] Phone: [phone] Mobile Phone: [phone] Relation: Daughter Secondary Emergency Contact: [place] of [place] Phone: [phone] Relation: Grandson  Code Status:  DNR Goals of care: Advanced Directive information Advanced Directives 07/10/2017  Does Patient Have a Medical Advance Directive? Yes  Type of Paramedic of [place];Out of facility DNR (pink MOST or yellow form);Living will  Does patient want to make changes to medical advance directive? -  Copy of [place] in Chart? Yes  Pre-existing out of facility DNR order (yellow form or pink MOST form) Yellow form placed in chart (order not valid for inpatient use);Pink MOST form placed in chart (order not valid for inpatient use)     Chief Complaint  Patient presents with  . Acute Visit    bruising to head and arms-unknown cause    HPI:  Pt is a 84 y.o. female seen today at [facility] for an acute visit for evaluation of bruises on the arms and scalp.she is seen in her room today with two facility Nurse at bedside.Nurse reports patient's daughter noted bruises on patient's arms and scalp.Patient  unable to provide HPI and ROS.she is on Asprin EC 81 mg tablet daily.Nurse states one bruise seems to be from previous blood draw.Patient's occipital area red spot are birth marks and not bruise.     Past Medical History:  Diagnosis Date  . Arthritis   . Cataracts, bilateral   . Closed fracture of unspecified part of lower end of humerus   . Dementia   . Hypertension   . Insomnia, unspecified   . Macular degeneration (senile) of retina, unspecified   . Osteoarthritis   . Osteoarthrosis, unspecified whether generalized or localized, unspecified site   . Pancreatitis, acute 08/14/2013  . Retinal neovascularization NOS   . Senile osteoporosis   . Subarachnoid hemorrhage following injury ([place]) 03/31/2012  . Unspecified hearing loss   . Unspecified hypothyroidism   . Urinary tract infection 08/10/2013  . Vertebral fracture 08/09/2013   T7 10%, T12 70%    Past Surgical History:  Procedure Laterality Date  . ABDOMINAL HYSTERECTOMY  1942  . ANKLE FRACTURE SURGERY  1960  . BLADDER SUSPENSION    . CLOSED REDUCTION WITH HUMERAL PIN INSERTION  04/01/2012   Procedure: CLOSED REDUCTION WITH HUMERAL PIN INSERTION;  Surgeon: Schuyler Amor, MD;  Location: [facility];  Service: Orthopedics;  Laterality: Left;    No Known Allergies  Outpatient Encounter Medications as of 07/10/2017  Medication Sig  . acetaminophen (TYLENOL) 500 MG tablet Take 500 mg by mouth every 6 (six) hours as needed. Pain  . amLODipine (NORVASC) 10 MG tablet Take 10 mg by mouth daily.  Marland Kitchen aspirin EC 81 MG tablet Take 81 mg by mouth daily.  . Cholecalciferol 1000 units capsule Take 2,000 Units by mouth daily.   Marland Kitchen ketoconazole (  NIZORAL) 2 % shampoo Apply 1 application topically once a week. On Wednesday  . levothyroxine (SYNTHROID, LEVOTHROID) 75 MCG tablet Take 75 mcg by mouth daily before breakfast.  . LORazepam (ATIVAN) 1 MG tablet Take 1 mg by mouth as needed for anxiety (Give 1 hour prior to dental appointment).  .  losartan (COZAAR) 100 MG tablet Take 100 mg by mouth daily.  . metoprolol (LOPRESSOR) 100 MG tablet Take 100 mg by mouth 2 (two) times daily.   . raloxifene (EVISTA) 60 MG tablet Take 60 mg by mouth daily.  Marland Kitchen senna-docusate (SENOKOT-S) 8.6-50 MG per tablet Take 2 tablets by mouth at bedtime as needed for moderate constipation.   . [DISCONTINUED] levothyroxine (SYNTHROID, LEVOTHROID) 50 MCG tablet Take 50 mcg by mouth daily before breakfast.   No facility-administered encounter medications on file as of 07/10/2017.     Review of Systems  Unable to perform ROS: Dementia (additional information provided by facility Nurse)    Immunization History  Administered Date(s) Administered  . Influenza-Unspecified 03/02/2013, 02/15/2014, 02/16/2015, 02/22/2016, 02/20/2017  . PPD Test 03/23/2012  . Pneumococcal Conjugate-13 12/17/2016  . Pneumococcal Polysaccharide-23 09/22/2002  . Td 11/17/2002  . Tdap 12/16/2016   Pertinent  Health Maintenance Due  Topic Date Due  . DEXA SCAN  12/29/1983  . INFLUENZA VACCINE  Completed  . PNA vac Low Risk Adult  Completed   Fall Risk  12/13/2016 02/07/2015 02/22/2014 02/22/2014  Falls in the past year? No No Yes No  Number falls in past yr: - - 1 -  Risk for fall due to : - - History of fall(s) -    Vitals:   07/10/17 1051  BP: (!) 167/87  Pulse: 80  Resp: 13  Temp: 98 F (36.7 C)  SpO2: 97%  Weight: 150 lb (68 kg)  Height: 5' 1.5" (1.562 m)   Body mass index is 27.88 kg/m. Physical Exam  Constitutional: She appears well-developed.  Elderly in no acute distress  HENT:  Head: Normocephalic.  Mouth/Throat: Oropharynx is clear and moist. No oropharyngeal exudate.  HOH  Eyes: Conjunctivae are normal. Pupils are equal, round, and reactive to light. Right eye exhibits no discharge. Left eye exhibits no discharge. No scleral icterus.  Neck: Normal range of motion. No JVD present. No thyromegaly present.  Cardiovascular: Normal rate, regular rhythm,  normal heart sounds and intact distal pulses. Exam reveals no friction rub.  No murmur heard. Pulmonary/Chest: Effort normal and breath sounds normal. No respiratory distress. She has no wheezes. She has no rales.  Abdominal: Soft. Bowel sounds are normal. She exhibits no distension. There is no tenderness. There is no rebound and no guarding.  Musculoskeletal: Normal range of motion. She exhibits no edema or tenderness.  Unsteady gait uses FWW  Lymphadenopathy:    She has no cervical adenopathy.  Neurological: She is alert. Coordination normal.  Pleasantly confused at her baseline  Skin: Skin is warm and dry. No rash noted. No erythema. No pallor.  Right and left arm old purple bruise.occiput area small pink color.Non-tender touch.surrounding skin tissue without any signs of infection.  Psychiatric: She has a normal mood and affect. Cognition and memory are impaired.   Labs reviewed: Recent Labs    09/23/16 01/23/17 03/27/17  NA 138 136* 139  K 4.0 4.0 4.1  BUN 15 18 19   CREATININE 0.6 0.7 0.6   Recent Labs    09/23/16  AST 14  ALT 15  ALKPHOS 35   Recent Labs  09/23/16 03/27/17  WBC 5.4 5.8  HGB 11.5* 13.2  HCT 34* 38  PLT 160 180   Lab Results  Component Value Date   TSH 4.85 06/26/2017   Lab Results  Component Value Date   HGBA1C 6.7 07/18/2016   Lab Results  Component Value Date   CHOL 146 01/23/2017   HDL 39 01/23/2017   LDLCALC 76 01/23/2017   TRIG 214 (A) 01/23/2017    Significant Diagnostic Results in last 30 days:  No results found.  Assessment/Plan  Bruise Afebrile.Right and left arm old purple bruise.unknown cause though nurse suspect possible blood draw.occiput area small pink color area .Non-tender to touch.surrounding skin tissue without any signs of infection.on Asprin 81 mg tablet daily therefore easily bruise.continue to monitor.  Family/ staff Communication: Reviewed plan of care with patient and facility Nurse.  Labs/tests ordered:  None   Dinah C Ngetich, NP

## 2017-07-23 ENCOUNTER — Non-Acute Institutional Stay (SKILLED_NURSING_FACILITY): Payer: Medicare Other | Admitting: Internal Medicine

## 2017-07-23 ENCOUNTER — Encounter: Payer: Self-pay | Admitting: Internal Medicine

## 2017-07-23 DIAGNOSIS — E039 Hypothyroidism, unspecified: Secondary | ICD-10-CM

## 2017-07-23 DIAGNOSIS — M8000XS Age-related osteoporosis with current pathological fracture, unspecified site, sequela: Secondary | ICD-10-CM

## 2017-07-23 DIAGNOSIS — I10 Essential (primary) hypertension: Secondary | ICD-10-CM | POA: Diagnosis not present

## 2017-07-23 DIAGNOSIS — K5909 Other constipation: Secondary | ICD-10-CM | POA: Diagnosis not present

## 2017-07-23 DIAGNOSIS — F0151 Vascular dementia with behavioral disturbance: Secondary | ICD-10-CM | POA: Diagnosis not present

## 2017-07-23 DIAGNOSIS — F01518 Vascular dementia, unspecified severity, with other behavioral disturbance: Secondary | ICD-10-CM

## 2017-07-23 NOTE — Progress Notes (Signed)
Location:  [place] Room Number: 2 Place of Service:  SNF (31) Provider:  Blanchie Serve MD  Blanchie Serve, MD  Patient Care Team: Blanchie Serve, MD as PCP - General (Internal Medicine) Mast, Man X, NP as Nurse Practitioner (Nurse Practitioner) Charlotte Crumb, MD as Consulting Physician (Orthopedic Surgery) Wallene Huh, DPM as Consulting Physician (Podiatry) Gaynelle Arabian, MD as Consulting Physician (Orthopedic Surgery) Zebedee Iba., MD as Referring Physician (Ophthalmology) Newman Pies, MD as Consulting Physician (Neurosurgery) Melina Modena, [facility] (Skilled Nursing and [place])  Extended Emergency Contact Information Primary Emergency Contact: [place] of [place] Phone: [phone] Mobile Phone: [phone] Relation: Daughter Secondary Emergency Contact: [place] of [place] Phone: [phone] Relation: Grandson  Code Status:  DNR  Goals of care: Advanced Directive information Advanced Directives 07/23/2017  Does Patient Have a Medical Advance Directive? Yes  Type of Paramedic of [place];Living will;Out of facility DNR (pink MOST or yellow form)  Does patient want to make changes to medical advance directive? No - Patient declined  Copy of [place] in Chart? Yes  Pre-existing out of facility DNR order (yellow form or pink MOST form) Yellow form placed in chart (order not valid for inpatient use);Pink MOST form placed in chart (order not valid for inpatient use)     Chief Complaint  Patient presents with  . Medical Management of Chronic Issues    Routine Visit     HPI:  Pt is a 84 y.o. female seen today for medical management of chronic diseases. She is pleasantly confused and very hard of hearing. Difficult to obtain HPI and ROS. She had her lunch today. She is in no distress and denies any concern. No acute concern  from nursing.    Past Medical History:  Diagnosis Date  . Arthritis   . Cataracts, bilateral   . Closed fracture of unspecified part of lower end of humerus   . Dementia   . Hypertension   . Insomnia, unspecified   . Macular degeneration (senile) of retina, unspecified   . Osteoarthritis   . Osteoarthrosis, unspecified whether generalized or localized, unspecified site   . Pancreatitis, acute 08/14/2013  . Retinal neovascularization NOS   . Senile osteoporosis   . Subarachnoid hemorrhage following injury ([place]) 03/31/2012  . Unspecified hearing loss   . Unspecified hypothyroidism   . Urinary tract infection 08/10/2013  . Vertebral fracture 08/09/2013   T7 10%, T12 70%    Past Surgical History:  Procedure Laterality Date  . ABDOMINAL HYSTERECTOMY  1942  . ANKLE FRACTURE SURGERY  1960  . BLADDER SUSPENSION    . CLOSED REDUCTION WITH HUMERAL PIN INSERTION  04/01/2012   Procedure: CLOSED REDUCTION WITH HUMERAL PIN INSERTION;  Surgeon: Schuyler Amor, MD;  Location: [facility];  Service: Orthopedics;  Laterality: Left;    No Known Allergies  Outpatient Encounter Medications as of 07/23/2017  Medication Sig  . acetaminophen (TYLENOL) 500 MG tablet Take 500 mg by mouth every 6 (six) hours as needed. Pain  . amLODipine (NORVASC) 10 MG tablet Take 10 mg by mouth daily.  Marland Kitchen aspirin EC 81 MG tablet Take 81 mg by mouth daily.  . Cholecalciferol 1000 units capsule Take 2,000 Units by mouth daily.   Marland Kitchen ketoconazole (NIZORAL) 2 % shampoo Apply 1 application topically once a week. On Wednesday  . levothyroxine (SYNTHROID, LEVOTHROID) 75 MCG tablet Take 75 mcg by mouth daily before breakfast.  .  LORazepam (ATIVAN) 1 MG tablet Take 1 mg by mouth as needed for anxiety (Give 1 hour prior to dental appointment).  . losartan (COZAAR) 100 MG tablet Take 100 mg by mouth daily.  . metoprolol (LOPRESSOR) 100 MG tablet Take 100 mg by mouth 2 (two) times daily.   . raloxifene (EVISTA) 60 MG tablet Take 60  mg by mouth daily.  Marland Kitchen senna-docusate (SENOKOT-S) 8.6-50 MG per tablet Take 2 tablets by mouth at bedtime as needed for moderate constipation.    No facility-administered encounter medications on file as of 07/23/2017.     Review of Systems  Unable to perform ROS: Dementia    Immunization History  Administered Date(s) Administered  . Influenza-Unspecified 03/02/2013, 02/15/2014, 02/16/2015, 02/22/2016, 02/20/2017  . PPD Test 03/23/2012  . Pneumococcal Conjugate-13 12/17/2016  . Pneumococcal Polysaccharide-23 09/22/2002  . Td 11/17/2002  . Tdap 12/16/2016   Pertinent  Health Maintenance Due  Topic Date Due  . DEXA SCAN  12/29/1983  . INFLUENZA VACCINE  Completed  . PNA vac Low Risk Adult  Completed   Fall Risk  12/13/2016 02/07/2015 02/22/2014 02/22/2014  Falls in the past year? No No Yes No  Number falls in past yr: - - 1 -  Risk for fall due to : - - History of fall(s) -   Functional Status Survey:    Vitals:   07/23/17 1546  BP: (!) 167/87  Pulse: 68  Resp: 14  Temp: (!) 97.5 F (36.4 C)  TempSrc: Oral  SpO2: 92%  Weight: 148 lb (67.1 kg)  Height: 5' 1.5" (1.562 m)   Body mass index is 27.51 kg/m.   Wt Readings from Last 3 Encounters:  07/23/17 148 lb (67.1 kg)  07/10/17 150 lb (68 kg)  06/30/17 150 lb (68 kg)   Physical Exam  Constitutional: She appears well-developed and well-nourished. No distress.  HENT:  Head: Normocephalic and atraumatic.  Mouth/Throat: Oropharynx is clear and moist.  Eyes: Conjunctivae and EOM are normal. Pupils are equal, round, and reactive to light. Right eye exhibits no discharge. Left eye exhibits no discharge.  Neck: Normal range of motion. Neck supple.  Cardiovascular: Normal rate and regular rhythm.  Pulmonary/Chest: Effort normal and breath sounds normal. She has no wheezes. She has no rales.  Abdominal: Soft. Bowel sounds are normal. There is no tenderness. There is no guarding.  Takes her medications crushed    Musculoskeletal: She exhibits edema.  Unsteady gait, ambulates with walker, no fall reported.   Lymphadenopathy:    She has no cervical adenopathy.  Neurological: She is alert.  Oriented to self  Skin: Skin is warm and dry. No rash noted. She is not diaphoretic.  Psychiatric: She has a normal mood and affect.    Labs reviewed: Recent Labs    09/23/16 01/23/17 03/27/17  NA 138 136* 139  K 4.0 4.0 4.1  BUN 15 18 19   CREATININE 0.6 0.7 0.6   Recent Labs    09/23/16  AST 14  ALT 15  ALKPHOS 35   Recent Labs    09/23/16 03/27/17  WBC 5.4 5.8  HGB 11.5* 13.2  HCT 34* 38  PLT 160 180   Lab Results  Component Value Date   TSH 4.85 06/26/2017   Lab Results  Component Value Date   HGBA1C 6.7 07/18/2016   Lab Results  Component Value Date   CHOL 146 01/23/2017   HDL 39 01/23/2017   LDLCALC 76 01/23/2017   TRIG 214 (A) 01/23/2017  Significant Diagnostic Results in last 30 days:  No results found.  Assessment/Plan  Hypothyroidism Continue levothyroxine 75 mcg daily.  Osteoporosis Continue raloxifene, to ambulate with walker, fall precautions, vit d supplement  Chronic constipation Continue senokot s 2 tab qhs prn and monitor  Hypertension Continue aspirin, metoprolol 100 mg bid, amlodipine 10 mg daily and losartan 100 mg daily for now. Reviewed BMP  Dementia with behavioral disturbance Supportive care, redirection for behavior/ agitation   Family/ staff Communication: reviewed care plan with patient and charge nurse.    Labs/tests ordered:  none   Blanchie Serve, MD Internal Medicine [facility] [address] [place], [place] [address] Cell Phone (Monday-Friday 8 am - 5 pm): [phone] On Call: [phone] and follow prompts after 5 pm and on weekends Office Phone: [phone] Office Fax: [phone]

## 2017-08-11 ENCOUNTER — Non-Acute Institutional Stay (SKILLED_NURSING_FACILITY): Payer: Medicare Other | Admitting: Family

## 2017-08-11 ENCOUNTER — Encounter: Payer: Self-pay | Admitting: Family

## 2017-08-11 ENCOUNTER — Encounter: Payer: Self-pay | Admitting: *Deleted

## 2017-08-11 DIAGNOSIS — S0083XA Contusion of other part of head, initial encounter: Secondary | ICD-10-CM | POA: Diagnosis not present

## 2017-08-11 DIAGNOSIS — F01518 Vascular dementia, unspecified severity, with other behavioral disturbance: Secondary | ICD-10-CM

## 2017-08-11 DIAGNOSIS — W19XXXA Unspecified fall, initial encounter: Secondary | ICD-10-CM

## 2017-08-11 DIAGNOSIS — R2681 Unsteadiness on feet: Secondary | ICD-10-CM

## 2017-08-11 DIAGNOSIS — F0151 Vascular dementia with behavioral disturbance: Secondary | ICD-10-CM

## 2017-08-11 DIAGNOSIS — Y92129 Unspecified place in nursing home as the place of occurrence of the external cause: Secondary | ICD-10-CM | POA: Diagnosis not present

## 2017-08-11 NOTE — Progress Notes (Signed)
Location:  [place] Room Number: 2 Place of Service:  SNF (31) Provider: Shalaina Guardiola FNP-C  Blanchie Serve, MD  Patient Care Team: Blanchie Serve, MD as PCP - General (Internal Medicine) Mast, Man X, NP as Nurse Practitioner (Nurse Practitioner) Charlotte Crumb, MD as Consulting Physician (Orthopedic Surgery) Wallene Huh, DPM as Consulting Physician (Podiatry) Gaynelle Arabian, MD as Consulting Physician (Orthopedic Surgery) Zebedee Iba., MD as Referring Physician (Ophthalmology) Newman Pies, MD as Consulting Physician (Neurosurgery) Melina Modena, [facility] (Skilled Nursing and [place])  Extended Emergency Contact Information Primary Emergency Contact: [place] of [place] Phone: [phone] Mobile Phone: [phone] Relation: Daughter Secondary Emergency Contact: [place] of [place] Phone: [phone] Relation: Grandson  Code Status:  DNR Goals of care: Advanced Directive information Advanced Directives 08/11/2017  Does Patient Have a Medical Advance Directive? Yes  Type of Paramedic of [place];Out of facility DNR (pink MOST or yellow form);Living will  Does patient want to make changes to medical advance directive? -  Copy of [place] in Chart? Yes  Pre-existing out of facility DNR order (yellow form or pink MOST form) Yellow form placed in chart (order not valid for inpatient use);Pink MOST form placed in chart (order not valid for inpatient use)     Chief Complaint  Patient presents with  . Acute Visit    Fall with bruising to face    HPI:  Pt is a 84 y.o. female seen today at [facility] for an acute visit for evaluation of fall episode.she is seen today with facility Nurse present.Nurse reports patient was seen by Nurse assistant on the floor in her room lying on her left side.Bruise was noted on her left eye / face  and left index finger.she denies any pain,fever,chills or cough.Her HPI and ROS limited due to her cognitive impairment.Nurse reports no new signs of increased confusion.   Past Medical History:  Diagnosis Date  . Arthritis   . Cataracts, bilateral   . Closed fracture of unspecified part of lower end of humerus   . Dementia   . Hypertension   . Insomnia, unspecified   . Macular degeneration (senile) of retina, unspecified   . Osteoarthritis   . Osteoarthrosis, unspecified whether generalized or localized, unspecified site   . Pancreatitis, acute 08/14/2013  . Retinal neovascularization NOS   . Senile osteoporosis   . Subarachnoid hemorrhage following injury ([place]) 03/31/2012  . Unspecified hearing loss   . Unspecified hypothyroidism   . Urinary tract infection 08/10/2013  . Vertebral fracture 08/09/2013   T7 10%, T12 70%    Past Surgical History:  Procedure Laterality Date  . ABDOMINAL HYSTERECTOMY  1942  . ANKLE FRACTURE SURGERY  1960  . BLADDER SUSPENSION    . CLOSED REDUCTION WITH HUMERAL PIN INSERTION  04/01/2012   Procedure: CLOSED REDUCTION WITH HUMERAL PIN INSERTION;  Surgeon: Schuyler Amor, MD;  Location: [facility];  Service: Orthopedics;  Laterality: Left;    No Known Allergies  Outpatient Encounter Medications as of 08/11/2017  Medication Sig  . acetaminophen (TYLENOL) 500 MG tablet Take 500 mg by mouth every 6 (six) hours as needed. Pain  . amLODipine (NORVASC) 10 MG tablet Take 10 mg by mouth daily.  Marland Kitchen aspirin EC 81 MG tablet Take 81 mg by mouth daily.  . Cholecalciferol 1000 units capsule Take 2,000 Units by mouth daily.   Marland Kitchen ketoconazole (NIZORAL) 2 % shampoo Apply  1 application topically once a week. On Wednesday  . levothyroxine (SYNTHROID, LEVOTHROID) 75 MCG tablet Take 75 mcg by mouth daily before breakfast.  . LORazepam (ATIVAN) 1 MG tablet Take 1 mg by mouth as needed for anxiety (Give 1 hour prior to dental appointment).  . losartan (COZAAR) 100 MG tablet  Take 100 mg by mouth daily.  . metoprolol (LOPRESSOR) 100 MG tablet Take 100 mg by mouth 2 (two) times daily.   . raloxifene (EVISTA) 60 MG tablet Take 60 mg by mouth daily.  Marland Kitchen senna-docusate (SENOKOT-S) 8.6-50 MG per tablet Take 2 tablets by mouth at bedtime as needed for moderate constipation.    No facility-administered encounter medications on file as of 08/11/2017.     Review of Systems  Unable to perform ROS: Dementia (additional infomation provided by facility nurse )    Immunization History  Administered Date(s) Administered  . Influenza-Unspecified 03/02/2013, 02/15/2014, 02/16/2015, 02/22/2016, 02/20/2017  . PPD Test 03/23/2012  . Pneumococcal Conjugate-13 12/17/2016  . Pneumococcal Polysaccharide-23 09/22/2002  . Td 11/17/2002  . Tdap 12/16/2016   Pertinent  Health Maintenance Due  Topic Date Due  . DEXA SCAN  12/29/1983  . INFLUENZA VACCINE  12/11/2017  . PNA vac Low Risk Adult  Completed   Fall Risk  12/13/2016 02/07/2015 02/22/2014 02/22/2014  Falls in the past year? No No Yes No  Number falls in past yr: - - 1 -  Risk for fall due to : - - History of fall(s) -   Functional Status Survey:    Vitals:   08/11/17 1118  BP: (!) 158/62  Pulse: 76  Resp: 14  Temp: (!) 97.5 F (36.4 C)  SpO2: 92%  Weight: 148 lb (67.1 kg)  Height: 5' 1.5" (1.562 m)   Body mass index is 27.51 kg/m. Physical Exam  Constitutional: She appears well-developed.  Elderly in  No acute distress   HENT:  Head: Normocephalic.  Mouth/Throat: Oropharynx is clear and moist. No oropharyngeal exudate.  Eyes: Pupils are equal, round, and reactive to light. Conjunctivae and EOM are normal. Right eye exhibits no discharge. Left eye exhibits no discharge. No scleral icterus.  Neck: Normal range of motion. No JVD present. No thyromegaly present.  Cardiovascular: Normal rate, regular rhythm, normal heart sounds and intact distal pulses. Exam reveals no gallop and no friction rub.  No murmur  heard. Pulmonary/Chest: Effort normal and breath sounds normal. No respiratory distress. She has no wheezes. She has no rales.  Abdominal: Soft. Bowel sounds are normal. She exhibits no distension. There is no tenderness. There is no rebound and no guarding.  Genitourinary:  Genitourinary Comments: Incontinent   Musculoskeletal: She exhibits no edema or tenderness.  Moves x 4 extremities unsteady gait uses FWW.  Lymphadenopathy:    She has no cervical adenopathy.  Neurological: She is alert. Coordination normal.  Confused at her baseline  Skin: Skin is warm and dry. No rash noted. No pallor.  Purple bruise noted on left eye upper periorbital and left cheek.Left cheek slight tender to touch.No swelling or drainage noted.   Psychiatric: She has a normal mood and affect. Her speech is normal and behavior is normal. Judgment and thought content normal. She exhibits abnormal recent memory.   Labs reviewed: Recent Labs    09/23/16 01/23/17 03/27/17  NA 138 136* 139  K 4.0 4.0 4.1  BUN 15 18 19   CREATININE 0.6 0.7 0.6   Recent Labs    09/23/16  AST 14  ALT 15  ALKPHOS 35   Recent Labs    09/23/16 03/27/17  WBC 5.4 5.8  HGB 11.5* 13.2  HCT 34* 38  PLT 160 180   Lab Results  Component Value Date   TSH 4.85 06/26/2017   Lab Results  Component Value Date   HGBA1C 6.7 07/18/2016   Lab Results  Component Value Date   CHOL 146 01/23/2017   HDL 39 01/23/2017   LDLCALC 76 01/23/2017   TRIG 214 (A) 01/23/2017    Significant Diagnostic Results in last 30 days:  No results found.  Assessment/Plan 1. Unsteady gait Ambulates with Front wheel walker.she remains high risk for falls due to her advance age and cognitive impairment.will have her work with Physical therapy for gait training and exercise.  2. Fall at nursing home, initial encounter Afebrile.Observed 08/10/2017 by facility staff lying on her left side on the floor.Bruise to left upper eye and left face.Left face slight  tender on exam.Moves extremities without any difficulties.B/p/HR reviewed stable.Continue fall and safety precautions.PT/ST as above.   3. Contusion of face, initial encounter Afebrile.Purple bruise noted on left eye upper periorbital and left cheek.Left cheek slight tender to touch.No swelling or drainage noted.continue to monitor.  4. Dementia with behavioral disturbance Progressive decline expected.continues to have poor safety awareness.Speech Therapy for cognitive issues.Continue to assist with ADL's.   Family/ staff Communication: Reviewed plan of care with patient and facility Nurse.  Labs/tests ordered: None   Daylon Lafavor C Marria Mathison, NP

## 2017-08-11 NOTE — Progress Notes (Signed)
Opened in error

## 2017-08-12 DIAGNOSIS — W19XXXA Unspecified fall, initial encounter: Secondary | ICD-10-CM | POA: Diagnosis not present

## 2017-08-12 DIAGNOSIS — R41841 Cognitive communication deficit: Secondary | ICD-10-CM | POA: Diagnosis not present

## 2017-08-12 DIAGNOSIS — M545 Low back pain: Secondary | ICD-10-CM | POA: Diagnosis not present

## 2017-08-12 DIAGNOSIS — R296 Repeated falls: Secondary | ICD-10-CM | POA: Diagnosis not present

## 2017-08-12 DIAGNOSIS — M1990 Unspecified osteoarthritis, unspecified site: Secondary | ICD-10-CM | POA: Diagnosis not present

## 2017-08-12 DIAGNOSIS — H9190 Unspecified hearing loss, unspecified ear: Secondary | ICD-10-CM | POA: Diagnosis not present

## 2017-08-12 DIAGNOSIS — I10 Essential (primary) hypertension: Secondary | ICD-10-CM | POA: Diagnosis not present

## 2017-08-12 DIAGNOSIS — S42409A Unspecified fracture of lower end of unspecified humerus, initial encounter for closed fracture: Secondary | ICD-10-CM | POA: Diagnosis not present

## 2017-08-12 DIAGNOSIS — F0390 Unspecified dementia without behavioral disturbance: Secondary | ICD-10-CM | POA: Diagnosis not present

## 2017-08-12 DIAGNOSIS — S066X0A Traumatic subarachnoid hemorrhage without loss of consciousness, initial encounter: Secondary | ICD-10-CM | POA: Diagnosis not present

## 2017-08-12 DIAGNOSIS — R2681 Unsteadiness on feet: Secondary | ICD-10-CM | POA: Diagnosis not present

## 2017-08-12 DIAGNOSIS — M25579 Pain in unspecified ankle and joints of unspecified foot: Secondary | ICD-10-CM | POA: Diagnosis not present

## 2017-08-12 DIAGNOSIS — M6281 Muscle weakness (generalized): Secondary | ICD-10-CM | POA: Diagnosis not present

## 2017-08-12 DIAGNOSIS — M810 Age-related osteoporosis without current pathological fracture: Secondary | ICD-10-CM | POA: Diagnosis not present

## 2017-08-12 DIAGNOSIS — M159 Polyosteoarthritis, unspecified: Secondary | ICD-10-CM | POA: Diagnosis not present

## 2017-08-12 DIAGNOSIS — G4700 Insomnia, unspecified: Secondary | ICD-10-CM | POA: Diagnosis not present

## 2017-08-12 DIAGNOSIS — H26103 Unspecified traumatic cataract, bilateral: Secondary | ICD-10-CM | POA: Diagnosis not present

## 2017-08-12 DIAGNOSIS — R4789 Other speech disturbances: Secondary | ICD-10-CM | POA: Diagnosis not present

## 2017-08-12 DIAGNOSIS — H35059 Retinal neovascularization, unspecified, unspecified eye: Secondary | ICD-10-CM | POA: Diagnosis not present

## 2017-08-12 DIAGNOSIS — R29898 Other symptoms and signs involving the musculoskeletal system: Secondary | ICD-10-CM | POA: Diagnosis not present

## 2017-08-12 DIAGNOSIS — I609 Nontraumatic subarachnoid hemorrhage, unspecified: Secondary | ICD-10-CM | POA: Diagnosis not present

## 2017-08-14 DIAGNOSIS — R2681 Unsteadiness on feet: Secondary | ICD-10-CM | POA: Diagnosis not present

## 2017-08-14 DIAGNOSIS — F0390 Unspecified dementia without behavioral disturbance: Secondary | ICD-10-CM | POA: Diagnosis not present

## 2017-08-14 DIAGNOSIS — R4789 Other speech disturbances: Secondary | ICD-10-CM | POA: Diagnosis not present

## 2017-08-14 DIAGNOSIS — M6281 Muscle weakness (generalized): Secondary | ICD-10-CM | POA: Diagnosis not present

## 2017-08-14 DIAGNOSIS — M159 Polyosteoarthritis, unspecified: Secondary | ICD-10-CM | POA: Diagnosis not present

## 2017-08-14 DIAGNOSIS — I10 Essential (primary) hypertension: Secondary | ICD-10-CM | POA: Diagnosis not present

## 2017-08-16 DIAGNOSIS — M159 Polyosteoarthritis, unspecified: Secondary | ICD-10-CM | POA: Diagnosis not present

## 2017-08-16 DIAGNOSIS — R4789 Other speech disturbances: Secondary | ICD-10-CM | POA: Diagnosis not present

## 2017-08-16 DIAGNOSIS — M6281 Muscle weakness (generalized): Secondary | ICD-10-CM | POA: Diagnosis not present

## 2017-08-16 DIAGNOSIS — F0390 Unspecified dementia without behavioral disturbance: Secondary | ICD-10-CM | POA: Diagnosis not present

## 2017-08-16 DIAGNOSIS — R2681 Unsteadiness on feet: Secondary | ICD-10-CM | POA: Diagnosis not present

## 2017-08-16 DIAGNOSIS — I10 Essential (primary) hypertension: Secondary | ICD-10-CM | POA: Diagnosis not present

## 2017-08-19 ENCOUNTER — Non-Acute Institutional Stay (SKILLED_NURSING_FACILITY): Payer: Medicare Other | Admitting: Family

## 2017-08-19 ENCOUNTER — Encounter: Payer: Self-pay | Admitting: Family

## 2017-08-19 DIAGNOSIS — R634 Abnormal weight loss: Secondary | ICD-10-CM | POA: Diagnosis not present

## 2017-08-19 DIAGNOSIS — I10 Essential (primary) hypertension: Secondary | ICD-10-CM | POA: Diagnosis not present

## 2017-08-19 DIAGNOSIS — M159 Polyosteoarthritis, unspecified: Secondary | ICD-10-CM | POA: Diagnosis not present

## 2017-08-19 DIAGNOSIS — E039 Hypothyroidism, unspecified: Secondary | ICD-10-CM | POA: Diagnosis not present

## 2017-08-19 DIAGNOSIS — M6281 Muscle weakness (generalized): Secondary | ICD-10-CM | POA: Diagnosis not present

## 2017-08-19 DIAGNOSIS — F0390 Unspecified dementia without behavioral disturbance: Secondary | ICD-10-CM | POA: Diagnosis not present

## 2017-08-19 DIAGNOSIS — F0151 Vascular dementia with behavioral disturbance: Secondary | ICD-10-CM

## 2017-08-19 DIAGNOSIS — F01518 Vascular dementia, unspecified severity, with other behavioral disturbance: Secondary | ICD-10-CM

## 2017-08-19 DIAGNOSIS — Z8673 Personal history of transient ischemic attack (TIA), and cerebral infarction without residual deficits: Secondary | ICD-10-CM | POA: Diagnosis not present

## 2017-08-19 DIAGNOSIS — R4789 Other speech disturbances: Secondary | ICD-10-CM | POA: Diagnosis not present

## 2017-08-19 DIAGNOSIS — R2681 Unsteadiness on feet: Secondary | ICD-10-CM | POA: Diagnosis not present

## 2017-08-19 NOTE — Progress Notes (Signed)
Location:  [facility] Room Number: 2 Place of Service:  SNF (31) Provider: Estalee Mccandlish FNP-C   Blanchie Serve, MD  Patient Care Team: Blanchie Serve, MD as PCP - General (Internal Medicine) Mast, Man X, NP as Nurse Practitioner (Nurse Practitioner) Charlotte Crumb, MD as Consulting Physician (Orthopedic Surgery) Wallene Huh, DPM as Consulting Physician (Podiatry) Gaynelle Arabian, MD as Consulting Physician (Orthopedic Surgery) Zebedee Iba., MD as Referring Physician (Ophthalmology) Newman Pies, MD as Consulting Physician (Neurosurgery) Melina Modena, [facility] (Skilled Nursing and [place])  Extended Emergency Contact Information Primary Emergency Contact: [place] of [place] Phone: [phone] Mobile Phone: [phone] Relation: Daughter Secondary Emergency Contact: [place] of [place] Phone: [phone] Relation: Grandson  Code Status:  DNR Goals of care: Advanced Directive information Advanced Directives 08/19/2017  Does Patient Have a Medical Advance Directive? Yes  Type of Paramedic of [place];Out of facility DNR (pink MOST or yellow form);Living will  Does patient want to make changes to medical advance directive? -  Copy of [place] in Chart? Yes  Pre-existing out of facility DNR order (yellow form or pink MOST form) Yellow form placed in chart (order not valid for inpatient use);Pink MOST form placed in chart (order not valid for inpatient use)     Chief Complaint  Patient presents with  . Medical Management of Chronic Issues    monthly routine visit    HPI:  Pt is a 84 y.o. female seen today [place] for medical management of chronic diseases.she has a medical history of HTN,CVA,Hyperlipidemia,Hypothyroidism, OA,Osteoporosis among other conditions.she is seen in her room today with facility Nurse present at  bedside.she denies any acute issues during visit though her HPI and ROS limited due to patient's cognitive impairment and very hard of hearing.her weight log reviewed progressive weight loss noted Wt 148 lbs (07/12/2017);Wt 134.4 lbs (08/13/2017);Wt 137.9 lbs (08/15/2017).she eats 25-75 % of her meals at times.she continues to follow up with Registered Dietician.No recent fall episode since 08/10/2017. Left face bruises continue to resolve.   Past Medical History:  Diagnosis Date  . Arthritis   . Cataracts, bilateral   . Closed fracture of unspecified part of lower end of humerus   . Dementia   . Hypertension   . Insomnia, unspecified   . Macular degeneration (senile) of retina, unspecified   . Osteoarthritis   . Osteoarthrosis, unspecified whether generalized or localized, unspecified site   . Pancreatitis, acute 08/14/2013  . Retinal neovascularization NOS   . Senile osteoporosis   . Subarachnoid hemorrhage following injury ([place]) 03/31/2012  . Unspecified hearing loss   . Unspecified hypothyroidism   . Urinary tract infection 08/10/2013  . Vertebral fracture 08/09/2013   T7 10%, T12 70%    Past Surgical History:  Procedure Laterality Date  . ABDOMINAL HYSTERECTOMY  1942  . ANKLE FRACTURE SURGERY  1960  . BLADDER SUSPENSION    . CLOSED REDUCTION WITH HUMERAL PIN INSERTION  04/01/2012   Procedure: CLOSED REDUCTION WITH HUMERAL PIN INSERTION;  Surgeon: Schuyler Amor, MD;  Location: [facility];  Service: Orthopedics;  Laterality: Left;    No Known Allergies  Allergies as of 08/19/2017   No Known Allergies     Medication List        Accurate as of 08/19/17 12:22 PM. Always use your most recent med list.          acetaminophen 500 MG tablet  Commonly known as:  TYLENOL Take 500 mg by mouth every 6 (six) hours as needed. Pain   amLODipine 10 MG tablet Commonly known as:  NORVASC Take 10 mg by mouth daily.   aspirin EC 81 MG tablet Take 81 mg by mouth daily.   Cholecalciferol 1000  units capsule Take 2,000 Units by mouth daily.   ketoconazole 2 % shampoo Commonly known as:  NIZORAL Apply 1 application topically once a week. On Wednesday   levothyroxine 75 MCG tablet Commonly known as:  SYNTHROID, LEVOTHROID Take 75 mcg by mouth daily before breakfast.   LORazepam 1 MG tablet Commonly known as:  ATIVAN Take 1 mg by mouth as needed for anxiety (Give 1 hour prior to dental appointment).   losartan 100 MG tablet Commonly known as:  COZAAR Take 100 mg by mouth daily.   metoprolol tartrate 100 MG tablet Commonly known as:  LOPRESSOR Take 100 mg by mouth 2 (two) times daily.   raloxifene 60 MG tablet Commonly known as:  EVISTA Take 60 mg by mouth daily.   senna-docusate 8.6-50 MG tablet Commonly known as:  Senokot-S Take 2 tablets by mouth at bedtime as needed for moderate constipation.       Review of Systems  Unable to perform ROS: Dementia (additional information provided by patient's Nurse )    Immunization History  Administered Date(s) Administered  . Influenza-Unspecified 03/02/2013, 02/15/2014, 02/16/2015, 02/22/2016, 02/20/2017  . PPD Test 03/23/2012  . Pneumococcal Conjugate-13 12/17/2016  . Pneumococcal Polysaccharide-23 09/22/2002  . Td 11/17/2002  . Tdap 12/16/2016   Pertinent  Health Maintenance Due  Topic Date Due  . DEXA SCAN  12/29/1983  . INFLUENZA VACCINE  12/11/2017  . PNA vac Low Risk Adult  Completed   Fall Risk  12/13/2016 02/07/2015 02/22/2014 02/22/2014  Falls in the past year? No No Yes No  Number falls in past yr: - - 1 -  Risk for fall due to : - - History of fall(s) -    Vitals:   08/19/17 1039  BP: (!) 157/70  Pulse: 84  Resp: 19  Temp: (!) 97 F (36.1 C)  SpO2: 91%  Weight: 137 lb 14.4 oz (62.6 kg)  Height: 5' 1.5" (1.562 m)   Filed Weights   08/19/17 1039  Weight: 137 lb 14.4 oz (62.6 kg)   Body mass index is 25.63 kg/m. Physical Exam  Constitutional: She appears well-developed.  Elderly in no  acute distress very hard of hearing left ear worst than the right ear.   HENT:  Head: Normocephalic.  Mouth/Throat: Oropharynx is clear and moist. No oropharyngeal exudate.  Very HOH   Eyes: Pupils are equal, round, and reactive to light. Conjunctivae and EOM are normal. Right eye exhibits no discharge. Left eye exhibits no discharge. No scleral icterus.  Eye glasses in place   Neck: Normal range of motion. No JVD present. No thyromegaly present.  Cardiovascular: Normal rate, regular rhythm, normal heart sounds and intact distal pulses. Exam reveals no gallop and no friction rub.  No murmur heard. Pulmonary/Chest: Effort normal and breath sounds normal. No respiratory distress. She has no wheezes. She has no rales.  Abdominal: Soft. Bowel sounds are normal. She exhibits no distension. There is no tenderness. There is no rebound and no guarding.  LBM 08/18/2017  Genitourinary:  Genitourinary Comments: Incontinent   Musculoskeletal: She exhibits no edema or tenderness.  Moves x 4 extremities.unsteady gait uses front wheel walker.   Lymphadenopathy:    She has no  cervical adenopathy.  Neurological: She is alert. Gait abnormal.  Pleasantly confused at her baseline.very hard of hearing right ear worst than left ear.   Skin: Skin is warm and dry. No rash noted. No erythema. No pallor.  Skin intact   Psychiatric: She has a normal mood and affect. Her behavior is normal. Judgment and thought content normal. She exhibits abnormal recent memory.    Labs reviewed: Recent Labs    09/23/16 01/23/17 03/27/17  NA 138 136* 139  K 4.0 4.0 4.1  BUN 15 18 19   CREATININE 0.6 0.7 0.6   Recent Labs    09/23/16  AST 14  ALT 15  ALKPHOS 35   Recent Labs    09/23/16 03/27/17  WBC 5.4 5.8  HGB 11.5* 13.2  HCT 34* 38  PLT 160 180   Lab Results  Component Value Date   TSH 4.85 06/26/2017   Lab Results  Component Value Date   HGBA1C 6.7 07/18/2016   Lab Results  Component Value Date   CHOL  146 01/23/2017   HDL 39 01/23/2017   LDLCALC 76 01/23/2017   TRIG 214 (A) 01/23/2017    Significant Diagnostic Results in last 30 days:  No results found.  Assessment/Plan 1. Weight loss, abnormal Weight log reviewed progressive weight loss noted:  Wt 148 lbs (07/12/2017) Wt 134.4 lbs (08/13/2017) Wt 137.9 lbs (08/15/2017) Continue to encourage oral intake and Boost supplement.Continue to follow up with Registered Dietician.check TSH level and CMP 08/21/2017.   2. Hypothyroidism Continue on levothyroxine 75 mcg tablet daily.Recheck TSH level 08/21/2017.   3. Essential hypertension B/p stable.continue on amlodipine 10 mg tablet daily and metoprolol 100 mg tablet twice daily.CBC and CMP 08/21/2017.on ASA for chest pain prophylaxis.continue to monitor.   4. Vascular dementia with behavior disturbance No new behavioral issues.Progressive weight loss due to poor oral intake.continue to assist with her ADL's.encourage oral intake and fluids.continue on Lorazepam 1 mg tablet as needed.   5. History of CVA (cerebrovascular accident) No residual.continue on Aspirin EC 81 mg tablet daily.off statin due to advance age.   Family/ staff Communication: Reviewed plan of care with patient and facility Nurse.    Labs/tests ordered: CBC,TSH level and CMP 08/21/2017.  Sandrea Hughs, NP

## 2017-08-21 DIAGNOSIS — F0390 Unspecified dementia without behavioral disturbance: Secondary | ICD-10-CM | POA: Diagnosis not present

## 2017-08-21 DIAGNOSIS — R4789 Other speech disturbances: Secondary | ICD-10-CM | POA: Diagnosis not present

## 2017-08-21 DIAGNOSIS — R2681 Unsteadiness on feet: Secondary | ICD-10-CM | POA: Diagnosis not present

## 2017-08-21 DIAGNOSIS — M159 Polyosteoarthritis, unspecified: Secondary | ICD-10-CM | POA: Diagnosis not present

## 2017-08-21 DIAGNOSIS — I10 Essential (primary) hypertension: Secondary | ICD-10-CM | POA: Diagnosis not present

## 2017-08-21 DIAGNOSIS — M6281 Muscle weakness (generalized): Secondary | ICD-10-CM | POA: Diagnosis not present

## 2017-08-22 DIAGNOSIS — I10 Essential (primary) hypertension: Secondary | ICD-10-CM | POA: Diagnosis not present

## 2017-08-22 DIAGNOSIS — R4789 Other speech disturbances: Secondary | ICD-10-CM | POA: Diagnosis not present

## 2017-08-22 DIAGNOSIS — F0390 Unspecified dementia without behavioral disturbance: Secondary | ICD-10-CM | POA: Diagnosis not present

## 2017-08-22 DIAGNOSIS — M6281 Muscle weakness (generalized): Secondary | ICD-10-CM | POA: Diagnosis not present

## 2017-08-22 DIAGNOSIS — R2681 Unsteadiness on feet: Secondary | ICD-10-CM | POA: Diagnosis not present

## 2017-08-22 DIAGNOSIS — M159 Polyosteoarthritis, unspecified: Secondary | ICD-10-CM | POA: Diagnosis not present

## 2017-08-24 DIAGNOSIS — I10 Essential (primary) hypertension: Secondary | ICD-10-CM | POA: Diagnosis not present

## 2017-08-24 DIAGNOSIS — M6281 Muscle weakness (generalized): Secondary | ICD-10-CM | POA: Diagnosis not present

## 2017-08-24 DIAGNOSIS — R4789 Other speech disturbances: Secondary | ICD-10-CM | POA: Diagnosis not present

## 2017-08-24 DIAGNOSIS — R2681 Unsteadiness on feet: Secondary | ICD-10-CM | POA: Diagnosis not present

## 2017-08-24 DIAGNOSIS — M159 Polyosteoarthritis, unspecified: Secondary | ICD-10-CM | POA: Diagnosis not present

## 2017-08-24 DIAGNOSIS — F0390 Unspecified dementia without behavioral disturbance: Secondary | ICD-10-CM | POA: Diagnosis not present

## 2017-08-27 DIAGNOSIS — I10 Essential (primary) hypertension: Secondary | ICD-10-CM | POA: Diagnosis not present

## 2017-08-27 DIAGNOSIS — M6281 Muscle weakness (generalized): Secondary | ICD-10-CM | POA: Diagnosis not present

## 2017-08-27 DIAGNOSIS — F0390 Unspecified dementia without behavioral disturbance: Secondary | ICD-10-CM | POA: Diagnosis not present

## 2017-08-27 DIAGNOSIS — R2681 Unsteadiness on feet: Secondary | ICD-10-CM | POA: Diagnosis not present

## 2017-08-27 DIAGNOSIS — R4789 Other speech disturbances: Secondary | ICD-10-CM | POA: Diagnosis not present

## 2017-08-27 DIAGNOSIS — M159 Polyosteoarthritis, unspecified: Secondary | ICD-10-CM | POA: Diagnosis not present

## 2017-08-28 ENCOUNTER — Other Ambulatory Visit: Payer: Self-pay | Admitting: *Deleted

## 2017-08-28 DIAGNOSIS — M159 Polyosteoarthritis, unspecified: Secondary | ICD-10-CM | POA: Diagnosis not present

## 2017-08-28 DIAGNOSIS — R4789 Other speech disturbances: Secondary | ICD-10-CM | POA: Diagnosis not present

## 2017-08-28 DIAGNOSIS — R2681 Unsteadiness on feet: Secondary | ICD-10-CM | POA: Diagnosis not present

## 2017-08-28 DIAGNOSIS — I10 Essential (primary) hypertension: Secondary | ICD-10-CM | POA: Diagnosis not present

## 2017-08-28 DIAGNOSIS — F0390 Unspecified dementia without behavioral disturbance: Secondary | ICD-10-CM | POA: Diagnosis not present

## 2017-08-28 DIAGNOSIS — M6281 Muscle weakness (generalized): Secondary | ICD-10-CM | POA: Diagnosis not present

## 2017-08-28 MED ORDER — LOSARTAN POTASSIUM 100 MG PO TABS: 100.0000 mg | ORAL_TABLET | Freq: Every day | ORAL | 3 refills | Status: DC

## 2017-09-01 DIAGNOSIS — M6281 Muscle weakness (generalized): Secondary | ICD-10-CM | POA: Diagnosis not present

## 2017-09-01 DIAGNOSIS — R2681 Unsteadiness on feet: Secondary | ICD-10-CM | POA: Diagnosis not present

## 2017-09-01 DIAGNOSIS — E039 Hypothyroidism, unspecified: Secondary | ICD-10-CM | POA: Diagnosis not present

## 2017-09-01 DIAGNOSIS — I10 Essential (primary) hypertension: Secondary | ICD-10-CM | POA: Diagnosis not present

## 2017-09-01 DIAGNOSIS — M159 Polyosteoarthritis, unspecified: Secondary | ICD-10-CM | POA: Diagnosis not present

## 2017-09-01 DIAGNOSIS — R4789 Other speech disturbances: Secondary | ICD-10-CM | POA: Diagnosis not present

## 2017-09-01 DIAGNOSIS — F0390 Unspecified dementia without behavioral disturbance: Secondary | ICD-10-CM | POA: Diagnosis not present

## 2017-09-01 DIAGNOSIS — R634 Abnormal weight loss: Secondary | ICD-10-CM | POA: Diagnosis not present

## 2017-09-01 LAB — BASIC METABOLIC PANEL
BUN: 24 — AB (ref 4–21)
Creatinine: 0.7 (ref 0.5–1.1)
GLUCOSE: 138
POTASSIUM: 4.5 (ref 3.4–5.3)
SODIUM: 135 — AB (ref 137–147)

## 2017-09-01 LAB — HEPATIC FUNCTION PANEL
ALK PHOS: 54 (ref 25–125)
ALT: 11 (ref 7–35)
AST: 14 (ref 13–35)
BILIRUBIN, TOTAL: 0.8

## 2017-09-01 LAB — CBC AND DIFFERENTIAL
HCT: 35 — AB (ref 36–46)
HEMOGLOBIN: 12.3 (ref 12.0–16.0)
Platelets: 243 (ref 150–399)
WBC: 9.6

## 2017-09-01 LAB — TSH: TSH: 1.97 (ref 0.41–5.90)

## 2017-09-03 DIAGNOSIS — M159 Polyosteoarthritis, unspecified: Secondary | ICD-10-CM | POA: Diagnosis not present

## 2017-09-03 DIAGNOSIS — R2681 Unsteadiness on feet: Secondary | ICD-10-CM | POA: Diagnosis not present

## 2017-09-03 DIAGNOSIS — R4789 Other speech disturbances: Secondary | ICD-10-CM | POA: Diagnosis not present

## 2017-09-03 DIAGNOSIS — M6281 Muscle weakness (generalized): Secondary | ICD-10-CM | POA: Diagnosis not present

## 2017-09-03 DIAGNOSIS — F0390 Unspecified dementia without behavioral disturbance: Secondary | ICD-10-CM | POA: Diagnosis not present

## 2017-09-03 DIAGNOSIS — I10 Essential (primary) hypertension: Secondary | ICD-10-CM | POA: Diagnosis not present

## 2017-09-04 DIAGNOSIS — R4789 Other speech disturbances: Secondary | ICD-10-CM | POA: Diagnosis not present

## 2017-09-04 DIAGNOSIS — M6281 Muscle weakness (generalized): Secondary | ICD-10-CM | POA: Diagnosis not present

## 2017-09-04 DIAGNOSIS — R2681 Unsteadiness on feet: Secondary | ICD-10-CM | POA: Diagnosis not present

## 2017-09-04 DIAGNOSIS — F0390 Unspecified dementia without behavioral disturbance: Secondary | ICD-10-CM | POA: Diagnosis not present

## 2017-09-04 DIAGNOSIS — I10 Essential (primary) hypertension: Secondary | ICD-10-CM | POA: Diagnosis not present

## 2017-09-04 DIAGNOSIS — M159 Polyosteoarthritis, unspecified: Secondary | ICD-10-CM | POA: Diagnosis not present

## 2017-09-05 DIAGNOSIS — M159 Polyosteoarthritis, unspecified: Secondary | ICD-10-CM | POA: Diagnosis not present

## 2017-09-05 DIAGNOSIS — I10 Essential (primary) hypertension: Secondary | ICD-10-CM | POA: Diagnosis not present

## 2017-09-05 DIAGNOSIS — R4789 Other speech disturbances: Secondary | ICD-10-CM | POA: Diagnosis not present

## 2017-09-05 DIAGNOSIS — M6281 Muscle weakness (generalized): Secondary | ICD-10-CM | POA: Diagnosis not present

## 2017-09-05 DIAGNOSIS — R2681 Unsteadiness on feet: Secondary | ICD-10-CM | POA: Diagnosis not present

## 2017-09-05 DIAGNOSIS — F0390 Unspecified dementia without behavioral disturbance: Secondary | ICD-10-CM | POA: Diagnosis not present

## 2017-09-10 DIAGNOSIS — M25579 Pain in unspecified ankle and joints of unspecified foot: Secondary | ICD-10-CM | POA: Diagnosis not present

## 2017-09-10 DIAGNOSIS — S42409A Unspecified fracture of lower end of unspecified humerus, initial encounter for closed fracture: Secondary | ICD-10-CM | POA: Diagnosis not present

## 2017-09-10 DIAGNOSIS — M159 Polyosteoarthritis, unspecified: Secondary | ICD-10-CM | POA: Diagnosis not present

## 2017-09-10 DIAGNOSIS — R296 Repeated falls: Secondary | ICD-10-CM | POA: Diagnosis not present

## 2017-09-10 DIAGNOSIS — R29898 Other symptoms and signs involving the musculoskeletal system: Secondary | ICD-10-CM | POA: Diagnosis not present

## 2017-09-10 DIAGNOSIS — I609 Nontraumatic subarachnoid hemorrhage, unspecified: Secondary | ICD-10-CM | POA: Diagnosis not present

## 2017-09-10 DIAGNOSIS — M545 Low back pain: Secondary | ICD-10-CM | POA: Diagnosis not present

## 2017-09-10 DIAGNOSIS — R2681 Unsteadiness on feet: Secondary | ICD-10-CM | POA: Diagnosis not present

## 2017-09-10 DIAGNOSIS — W19XXXA Unspecified fall, initial encounter: Secondary | ICD-10-CM | POA: Diagnosis not present

## 2017-09-10 DIAGNOSIS — H26103 Unspecified traumatic cataract, bilateral: Secondary | ICD-10-CM | POA: Diagnosis not present

## 2017-09-10 DIAGNOSIS — H35059 Retinal neovascularization, unspecified, unspecified eye: Secondary | ICD-10-CM | POA: Diagnosis not present

## 2017-09-10 DIAGNOSIS — M1990 Unspecified osteoarthritis, unspecified site: Secondary | ICD-10-CM | POA: Diagnosis not present

## 2017-09-10 DIAGNOSIS — G4700 Insomnia, unspecified: Secondary | ICD-10-CM | POA: Diagnosis not present

## 2017-09-10 DIAGNOSIS — M810 Age-related osteoporosis without current pathological fracture: Secondary | ICD-10-CM | POA: Diagnosis not present

## 2017-09-10 DIAGNOSIS — R1319 Other dysphagia: Secondary | ICD-10-CM | POA: Diagnosis not present

## 2017-09-10 DIAGNOSIS — R41841 Cognitive communication deficit: Secondary | ICD-10-CM | POA: Diagnosis not present

## 2017-09-10 DIAGNOSIS — M6281 Muscle weakness (generalized): Secondary | ICD-10-CM | POA: Diagnosis not present

## 2017-09-10 DIAGNOSIS — R1310 Dysphagia, unspecified: Secondary | ICD-10-CM | POA: Diagnosis not present

## 2017-09-10 DIAGNOSIS — F0280 Dementia in other diseases classified elsewhere without behavioral disturbance: Secondary | ICD-10-CM | POA: Diagnosis not present

## 2017-09-10 DIAGNOSIS — H9190 Unspecified hearing loss, unspecified ear: Secondary | ICD-10-CM | POA: Diagnosis not present

## 2017-09-10 DIAGNOSIS — S066X0A Traumatic subarachnoid hemorrhage without loss of consciousness, initial encounter: Secondary | ICD-10-CM | POA: Diagnosis not present

## 2017-09-11 DIAGNOSIS — M159 Polyosteoarthritis, unspecified: Secondary | ICD-10-CM | POA: Diagnosis not present

## 2017-09-11 DIAGNOSIS — M6281 Muscle weakness (generalized): Secondary | ICD-10-CM | POA: Diagnosis not present

## 2017-09-11 DIAGNOSIS — F0280 Dementia in other diseases classified elsewhere without behavioral disturbance: Secondary | ICD-10-CM | POA: Diagnosis not present

## 2017-09-11 DIAGNOSIS — R1319 Other dysphagia: Secondary | ICD-10-CM | POA: Diagnosis not present

## 2017-09-11 DIAGNOSIS — R2681 Unsteadiness on feet: Secondary | ICD-10-CM | POA: Diagnosis not present

## 2017-09-11 DIAGNOSIS — R1310 Dysphagia, unspecified: Secondary | ICD-10-CM | POA: Diagnosis not present

## 2017-09-15 ENCOUNTER — Non-Acute Institutional Stay (SKILLED_NURSING_FACILITY): Payer: Medicare Other | Admitting: Family

## 2017-09-15 ENCOUNTER — Encounter: Payer: Self-pay | Admitting: Family

## 2017-09-15 DIAGNOSIS — M810 Age-related osteoporosis without current pathological fracture: Secondary | ICD-10-CM | POA: Diagnosis not present

## 2017-09-15 DIAGNOSIS — E039 Hypothyroidism, unspecified: Secondary | ICD-10-CM

## 2017-09-15 DIAGNOSIS — M159 Polyosteoarthritis, unspecified: Secondary | ICD-10-CM | POA: Diagnosis not present

## 2017-09-15 DIAGNOSIS — I10 Essential (primary) hypertension: Secondary | ICD-10-CM | POA: Diagnosis not present

## 2017-09-15 DIAGNOSIS — R1310 Dysphagia, unspecified: Secondary | ICD-10-CM | POA: Diagnosis not present

## 2017-09-15 DIAGNOSIS — F01518 Vascular dementia, unspecified severity, with other behavioral disturbance: Secondary | ICD-10-CM

## 2017-09-15 DIAGNOSIS — F0151 Vascular dementia with behavioral disturbance: Secondary | ICD-10-CM | POA: Diagnosis not present

## 2017-09-15 DIAGNOSIS — R1319 Other dysphagia: Secondary | ICD-10-CM | POA: Diagnosis not present

## 2017-09-15 DIAGNOSIS — F0280 Dementia in other diseases classified elsewhere without behavioral disturbance: Secondary | ICD-10-CM | POA: Diagnosis not present

## 2017-09-15 DIAGNOSIS — M6281 Muscle weakness (generalized): Secondary | ICD-10-CM | POA: Diagnosis not present

## 2017-09-15 DIAGNOSIS — R2681 Unsteadiness on feet: Secondary | ICD-10-CM | POA: Diagnosis not present

## 2017-09-15 NOTE — Progress Notes (Signed)
Location:  [facility] Room Number: 2 Place of Service:  SNF (31) Provider: Lilianne Delair FNP-C   Blanchie Serve, MD  Patient Care Team: Blanchie Serve, MD as PCP - General (Internal Medicine) Mast, Man X, NP as Nurse Practitioner (Nurse Practitioner) Charlotte Crumb, MD as Consulting Physician (Orthopedic Surgery) Wallene Huh, DPM as Consulting Physician (Podiatry) Gaynelle Arabian, MD as Consulting Physician (Orthopedic Surgery) Zebedee Iba., MD as Referring Physician (Ophthalmology) Newman Pies, MD as Consulting Physician (Neurosurgery) Melina Modena, [facility] (Skilled Nursing and [place])  Extended Emergency Contact Information Primary Emergency Contact: [place] of [place] Phone: [phone] Mobile Phone: [phone] Relation: Daughter Secondary Emergency Contact: [place] of [place] Phone: [phone] Relation: Grandson  Code Status: DNR  Goals of care: Advanced Directive information Advanced Directives 09/15/2017  Does Patient Have a Medical Advance Directive? Yes  Type of Paramedic of [place];Out of facility DNR (pink MOST or yellow form);Living will  Does patient want to make changes to medical advance directive? -  Copy of [place] in Chart? Yes  Pre-existing out of facility DNR order (yellow form or pink MOST form) Yellow form placed in chart (order not valid for inpatient use);Pink MOST form placed in chart (order not valid for inpatient use)     Chief Complaint  Patient presents with  . Medical Management of Chronic Issues    HPI:  Pt is a 84 y.o. female seen today [place] for medical management of chronic diseases.She has a medical history of HTN,Hx CVA,hypothyroidism,GERD, OA,osteoporosis among other conditions.she is seen in her room today with facility Nurse and supervisor.Nurse reports no new  concerns.Patient's pleasantly confused during visit "leave me alone" unable to provide HPI and ROS. No recent fall episode or weight changes.she eats fairly well for her meals.   Past Medical History:  Diagnosis Date  . Arthritis   . Cataracts, bilateral   . Closed fracture of unspecified part of lower end of humerus   . Dementia   . Hypertension   . Insomnia, unspecified   . Macular degeneration (senile) of retina, unspecified   . Osteoarthritis   . Osteoarthrosis, unspecified whether generalized or localized, unspecified site   . Pancreatitis, acute 08/14/2013  . Retinal neovascularization NOS   . Senile osteoporosis   . Subarachnoid hemorrhage following injury ([place]) 03/31/2012  . Unspecified hearing loss   . Unspecified hypothyroidism   . Urinary tract infection 08/10/2013  . Vertebral fracture 08/09/2013   T7 10%, T12 70%    Past Surgical History:  Procedure Laterality Date  . ABDOMINAL HYSTERECTOMY  1942  . ANKLE FRACTURE SURGERY  1960  . BLADDER SUSPENSION    . CLOSED REDUCTION WITH HUMERAL PIN INSERTION  04/01/2012   Procedure: CLOSED REDUCTION WITH HUMERAL PIN INSERTION;  Surgeon: Schuyler Amor, MD;  Location: [facility];  Service: Orthopedics;  Laterality: Left;    No Known Allergies  Allergies as of 09/15/2017   No Known Allergies     Medication List        Accurate as of 09/15/17  4:06 PM. Always use your most recent med list.          acetaminophen 500 MG tablet Commonly known as:  TYLENOL Take 500 mg by mouth every 6 (six) hours as needed. Pain   amLODipine 10 MG tablet Commonly known as:  NORVASC Take 10 mg by mouth daily.   aspirin EC 81 MG  tablet Take 81 mg by mouth daily.   BOOST GLUCOSE CONTROL Liqd Take 237 mLs by mouth 2 (two) times daily.   Cholecalciferol 1000 units capsule Take 2,000 Units by mouth daily.   ketoconazole 2 % shampoo Commonly known as:  NIZORAL Apply 1 application topically once a week. On Wednesday   levothyroxine 75  MCG tablet Commonly known as:  SYNTHROID, LEVOTHROID Take 75 mcg by mouth daily before breakfast.   LORazepam 1 MG tablet Commonly known as:  ATIVAN Take 1 mg by mouth as needed for anxiety (Give 1 hour prior to dental appointment).   losartan 100 MG tablet Commonly known as:  COZAAR Take 1 tablet (100 mg total) by mouth daily.   metoprolol tartrate 100 MG tablet Commonly known as:  LOPRESSOR Take 100 mg by mouth 2 (two) times daily.   raloxifene 60 MG tablet Commonly known as:  EVISTA Take 60 mg by mouth daily.   senna-docusate 8.6-50 MG tablet Commonly known as:  Senokot-S Take 2 tablets by mouth at bedtime as needed for moderate constipation.       Review of Systems  Unable to perform ROS: Dementia (additional information provided by patient's Nurse )    Immunization History  Administered Date(s) Administered  . Influenza-Unspecified 03/02/2013, 02/15/2014, 02/16/2015, 02/22/2016, 02/20/2017  . PPD Test 03/23/2012  . Pneumococcal Conjugate-13 12/17/2016  . Pneumococcal Polysaccharide-23 09/22/2002  . Td 11/17/2002  . Tdap 12/16/2016   Pertinent  Health Maintenance Due  Topic Date Due  . DEXA SCAN  12/29/1983  . INFLUENZA VACCINE  12/11/2017  . PNA vac Low Risk Adult  Completed   Fall Risk  12/13/2016 02/07/2015 02/22/2014 02/22/2014  Falls in the past year? No No Yes No  Number falls in past yr: - - 1 -  Risk for fall due to : - - History of fall(s) -    Vitals:   09/15/17 1020  BP: 99/63  Pulse: 69  Resp: 16  Temp: (!) 97.5 F (36.4 C)  SpO2: 91%  Weight: 129 lb 14.4 oz (58.9 kg)  Height: 5' 1.5" (1.562 m)    Body mass index is 24.15 kg/m. Physical Exam  Constitutional:  Frail elderly pleasantly confused at baseline in no acute distress.   HENT:  Head: Normocephalic.  Right Ear: External ear normal.  Left Ear: External ear normal.  Mouth/Throat: Oropharynx is clear and moist. No oropharyngeal exudate.  Eyes: Pupils are equal, round, and  reactive to light. Conjunctivae and EOM are normal. Right eye exhibits no discharge. Left eye exhibits no discharge. No scleral icterus.  Neck: Normal range of motion. No JVD present. No thyromegaly present.  Cardiovascular: Normal rate, regular rhythm, normal heart sounds and intact distal pulses. Exam reveals no gallop and no friction rub.  No murmur heard. Pulmonary/Chest: Effort normal and breath sounds normal. No stridor. No respiratory distress. She has no wheezes. She has no rales.  Abdominal: Soft. Bowel sounds are normal. She exhibits no distension and no mass. There is no tenderness. There is no rebound and no guarding.  Genitourinary:  Genitourinary Comments: Incontinent   Musculoskeletal: She exhibits no tenderness.  Unsteady gait uses FWW.chronic leg edema right >left.   Lymphadenopathy:    She has no cervical adenopathy.  Neurological:  Pleasantly confused at her baseline.   Skin: Skin is warm and dry. No rash noted. No erythema. No pallor.  Psychiatric: She has a normal mood and affect. Her speech is normal. She is agitated. Cognition and memory are impaired.  She expresses inappropriate judgment.    Labs reviewed: Recent Labs    09/23/16 01/23/17 03/27/17  NA 138 136* 139  K 4.0 4.0 4.1  BUN 15 18 19   CREATININE 0.6 0.7 0.6   Recent Labs    09/23/16  AST 14  ALT 15  ALKPHOS 35   Recent Labs    09/23/16 03/27/17  WBC 5.4 5.8  HGB 11.5* 13.2  HCT 34* 38  PLT 160 180   Lab Results  Component Value Date   TSH 4.85 06/26/2017   Lab Results  Component Value Date   HGBA1C 6.7 07/18/2016   Lab Results  Component Value Date   CHOL 146 01/23/2017   HDL 39 01/23/2017   LDLCALC 76 01/23/2017   TRIG 214 (A) 01/23/2017    Significant Diagnostic Results in last 30 days:  No results found.  Assessment/Plan 1. Essential hypertension B/p readings soft low today.B/p reviewed readings ranging in the 110's/70's-150's/70's.will continue on amlodipine 10 mg tablet  daily,losartan 100 mg tablet daily and metoprolol 100 mg tablet twice daily.will continue to monitor blood pressure then reduce if blood pressure remains stable.   2. Hypothyroidism Lab Results  Component Value Date   TSH 4.85 06/26/2017  Continue on levothyroxine 75 mcg tablet daily.continue to monitor.   3. Vascular dementia with behavior disturbance Continue on supportive care.continue on lorazepam 1 mg tablet as needed.   4. Senile osteoporosis No recent fall or fracture.continue on raloxifene 60 mg tablet daily and vitamin D 2000 units daily.  Family/ staff Communication: Reviewed plan of care with patient and facility Nurse supervisor   Labs/tests ordered: None   Sandrea Hughs, NP

## 2017-09-16 DIAGNOSIS — M6281 Muscle weakness (generalized): Secondary | ICD-10-CM | POA: Diagnosis not present

## 2017-09-16 DIAGNOSIS — R2681 Unsteadiness on feet: Secondary | ICD-10-CM | POA: Diagnosis not present

## 2017-09-16 DIAGNOSIS — R1310 Dysphagia, unspecified: Secondary | ICD-10-CM | POA: Diagnosis not present

## 2017-09-16 DIAGNOSIS — M159 Polyosteoarthritis, unspecified: Secondary | ICD-10-CM | POA: Diagnosis not present

## 2017-09-16 DIAGNOSIS — F0280 Dementia in other diseases classified elsewhere without behavioral disturbance: Secondary | ICD-10-CM | POA: Diagnosis not present

## 2017-09-16 DIAGNOSIS — R1319 Other dysphagia: Secondary | ICD-10-CM | POA: Diagnosis not present

## 2017-09-17 DIAGNOSIS — F0280 Dementia in other diseases classified elsewhere without behavioral disturbance: Secondary | ICD-10-CM | POA: Diagnosis not present

## 2017-09-17 DIAGNOSIS — M6281 Muscle weakness (generalized): Secondary | ICD-10-CM | POA: Diagnosis not present

## 2017-09-17 DIAGNOSIS — R1310 Dysphagia, unspecified: Secondary | ICD-10-CM | POA: Diagnosis not present

## 2017-09-17 DIAGNOSIS — R1319 Other dysphagia: Secondary | ICD-10-CM | POA: Diagnosis not present

## 2017-09-17 DIAGNOSIS — R2681 Unsteadiness on feet: Secondary | ICD-10-CM | POA: Diagnosis not present

## 2017-09-17 DIAGNOSIS — M159 Polyosteoarthritis, unspecified: Secondary | ICD-10-CM | POA: Diagnosis not present

## 2017-09-18 DIAGNOSIS — F0280 Dementia in other diseases classified elsewhere without behavioral disturbance: Secondary | ICD-10-CM | POA: Diagnosis not present

## 2017-09-18 DIAGNOSIS — R1319 Other dysphagia: Secondary | ICD-10-CM | POA: Diagnosis not present

## 2017-09-18 DIAGNOSIS — R1310 Dysphagia, unspecified: Secondary | ICD-10-CM | POA: Diagnosis not present

## 2017-09-18 DIAGNOSIS — M159 Polyosteoarthritis, unspecified: Secondary | ICD-10-CM | POA: Diagnosis not present

## 2017-09-18 DIAGNOSIS — M6281 Muscle weakness (generalized): Secondary | ICD-10-CM | POA: Diagnosis not present

## 2017-09-18 DIAGNOSIS — R2681 Unsteadiness on feet: Secondary | ICD-10-CM | POA: Diagnosis not present

## 2017-09-19 DIAGNOSIS — R2681 Unsteadiness on feet: Secondary | ICD-10-CM | POA: Diagnosis not present

## 2017-09-19 DIAGNOSIS — R1319 Other dysphagia: Secondary | ICD-10-CM | POA: Diagnosis not present

## 2017-09-19 DIAGNOSIS — M159 Polyosteoarthritis, unspecified: Secondary | ICD-10-CM | POA: Diagnosis not present

## 2017-09-19 DIAGNOSIS — M6281 Muscle weakness (generalized): Secondary | ICD-10-CM | POA: Diagnosis not present

## 2017-09-19 DIAGNOSIS — F0280 Dementia in other diseases classified elsewhere without behavioral disturbance: Secondary | ICD-10-CM | POA: Diagnosis not present

## 2017-09-19 DIAGNOSIS — R1310 Dysphagia, unspecified: Secondary | ICD-10-CM | POA: Diagnosis not present

## 2017-09-23 DIAGNOSIS — R1319 Other dysphagia: Secondary | ICD-10-CM | POA: Diagnosis not present

## 2017-09-23 DIAGNOSIS — R2681 Unsteadiness on feet: Secondary | ICD-10-CM | POA: Diagnosis not present

## 2017-09-23 DIAGNOSIS — R1310 Dysphagia, unspecified: Secondary | ICD-10-CM | POA: Diagnosis not present

## 2017-09-23 DIAGNOSIS — M159 Polyosteoarthritis, unspecified: Secondary | ICD-10-CM | POA: Diagnosis not present

## 2017-09-23 DIAGNOSIS — F0280 Dementia in other diseases classified elsewhere without behavioral disturbance: Secondary | ICD-10-CM | POA: Diagnosis not present

## 2017-09-23 DIAGNOSIS — M6281 Muscle weakness (generalized): Secondary | ICD-10-CM | POA: Diagnosis not present

## 2017-09-24 DIAGNOSIS — M159 Polyosteoarthritis, unspecified: Secondary | ICD-10-CM | POA: Diagnosis not present

## 2017-09-24 DIAGNOSIS — M6281 Muscle weakness (generalized): Secondary | ICD-10-CM | POA: Diagnosis not present

## 2017-09-24 DIAGNOSIS — R2681 Unsteadiness on feet: Secondary | ICD-10-CM | POA: Diagnosis not present

## 2017-09-24 DIAGNOSIS — F0280 Dementia in other diseases classified elsewhere without behavioral disturbance: Secondary | ICD-10-CM | POA: Diagnosis not present

## 2017-09-24 DIAGNOSIS — R1310 Dysphagia, unspecified: Secondary | ICD-10-CM | POA: Diagnosis not present

## 2017-09-24 DIAGNOSIS — R1319 Other dysphagia: Secondary | ICD-10-CM | POA: Diagnosis not present

## 2017-09-25 DIAGNOSIS — R2681 Unsteadiness on feet: Secondary | ICD-10-CM | POA: Diagnosis not present

## 2017-09-25 DIAGNOSIS — R1310 Dysphagia, unspecified: Secondary | ICD-10-CM | POA: Diagnosis not present

## 2017-09-25 DIAGNOSIS — F0280 Dementia in other diseases classified elsewhere without behavioral disturbance: Secondary | ICD-10-CM | POA: Diagnosis not present

## 2017-09-25 DIAGNOSIS — M159 Polyosteoarthritis, unspecified: Secondary | ICD-10-CM | POA: Diagnosis not present

## 2017-09-25 DIAGNOSIS — R1319 Other dysphagia: Secondary | ICD-10-CM | POA: Diagnosis not present

## 2017-09-25 DIAGNOSIS — M6281 Muscle weakness (generalized): Secondary | ICD-10-CM | POA: Diagnosis not present

## 2017-09-29 DIAGNOSIS — M159 Polyosteoarthritis, unspecified: Secondary | ICD-10-CM | POA: Diagnosis not present

## 2017-09-29 DIAGNOSIS — R1310 Dysphagia, unspecified: Secondary | ICD-10-CM | POA: Diagnosis not present

## 2017-09-29 DIAGNOSIS — R2681 Unsteadiness on feet: Secondary | ICD-10-CM | POA: Diagnosis not present

## 2017-09-29 DIAGNOSIS — R1319 Other dysphagia: Secondary | ICD-10-CM | POA: Diagnosis not present

## 2017-09-29 DIAGNOSIS — M6281 Muscle weakness (generalized): Secondary | ICD-10-CM | POA: Diagnosis not present

## 2017-09-29 DIAGNOSIS — F0280 Dementia in other diseases classified elsewhere without behavioral disturbance: Secondary | ICD-10-CM | POA: Diagnosis not present

## 2017-09-30 DIAGNOSIS — R1319 Other dysphagia: Secondary | ICD-10-CM | POA: Diagnosis not present

## 2017-09-30 DIAGNOSIS — R2681 Unsteadiness on feet: Secondary | ICD-10-CM | POA: Diagnosis not present

## 2017-09-30 DIAGNOSIS — M6281 Muscle weakness (generalized): Secondary | ICD-10-CM | POA: Diagnosis not present

## 2017-09-30 DIAGNOSIS — F0280 Dementia in other diseases classified elsewhere without behavioral disturbance: Secondary | ICD-10-CM | POA: Diagnosis not present

## 2017-09-30 DIAGNOSIS — R1310 Dysphagia, unspecified: Secondary | ICD-10-CM | POA: Diagnosis not present

## 2017-09-30 DIAGNOSIS — M159 Polyosteoarthritis, unspecified: Secondary | ICD-10-CM | POA: Diagnosis not present

## 2017-10-15 ENCOUNTER — Encounter: Payer: Self-pay | Admitting: Internal Medicine

## 2017-10-15 ENCOUNTER — Non-Acute Institutional Stay (SKILLED_NURSING_FACILITY): Payer: Medicare Other | Admitting: Internal Medicine

## 2017-10-15 DIAGNOSIS — I10 Essential (primary) hypertension: Secondary | ICD-10-CM

## 2017-10-15 DIAGNOSIS — E440 Moderate protein-calorie malnutrition: Secondary | ICD-10-CM | POA: Diagnosis not present

## 2017-10-15 DIAGNOSIS — F01518 Vascular dementia, unspecified severity, with other behavioral disturbance: Secondary | ICD-10-CM

## 2017-10-15 DIAGNOSIS — H903 Sensorineural hearing loss, bilateral: Secondary | ICD-10-CM

## 2017-10-15 DIAGNOSIS — E039 Hypothyroidism, unspecified: Secondary | ICD-10-CM

## 2017-10-15 DIAGNOSIS — M8000XS Age-related osteoporosis with current pathological fracture, unspecified site, sequela: Secondary | ICD-10-CM

## 2017-10-15 DIAGNOSIS — K5901 Slow transit constipation: Secondary | ICD-10-CM | POA: Diagnosis not present

## 2017-10-15 DIAGNOSIS — F0151 Vascular dementia with behavioral disturbance: Secondary | ICD-10-CM | POA: Diagnosis not present

## 2017-10-15 LAB — CMP 10231
Albumin: 3.1
CALCIUM: 8.8
Carbon Dioxide, Total: 27
Chloride: 100
GFR CALC NON AF AMER: 72
GLOBULIN: 2.9
TOTAL PROTEIN: 6

## 2017-10-15 NOTE — Progress Notes (Signed)
Location:  [facility] Room Number: 2 Place of Service:  SNF (31) Provider:  Blanchie Serve MD  Blanchie Serve, MD  Patient Care Team: Blanchie Serve, MD as PCP - General (Internal Medicine) Mast, Man X, NP as Nurse Practitioner (Nurse Practitioner) Charlotte Crumb, MD as Consulting Physician (Orthopedic Surgery) Wallene Huh, DPM as Consulting Physician (Podiatry) Gaynelle Arabian, MD as Consulting Physician (Orthopedic Surgery) Zebedee Iba., MD as Referring Physician (Ophthalmology) Newman Pies, MD as Consulting Physician (Neurosurgery) Melina Modena, [facility] (Skilled Nursing and [place])  Extended Emergency Contact Information Primary Emergency Contact: [place] of [place] Phone: [phone] Mobile Phone: [phone] Relation: Daughter Secondary Emergency Contact: [place] of [place] Phone: [phone] Relation: Grandson  Code Status:  DNR  Goals of care: Advanced Directive information Advanced Directives 10/15/2017  Does Patient Have a Medical Advance Directive? Yes  Type of Paramedic of [place];Living will;Out of facility DNR (pink MOST or yellow form)  Does patient want to make changes to medical advance directive? No - Patient declined  Copy of [place] in Chart? Yes  Pre-existing out of facility DNR order (yellow form or pink MOST form) Yellow form placed in chart (order not valid for inpatient use);Pink MOST form placed in chart (order not valid for inpatient use)     Chief Complaint  Patient presents with  . Medical Management of Chronic Issues    Routine Visit     HPI:  Pt is a 84 y.o. female seen today for medical management of chronic diseases.    Hypothyroidism- currently on levothyroxine 75 mcg daily. Tolerating it well.   Protein calorie malnutrition- doing better with finger food. Currently on nutritional  supplement  Osteoporosis- no fall reported. ambulates with wheelchair. Taking raloxifene 60 mg daily. Also on vitamin d supplement.   Hypertension- elevated BP today. BP as low as 108/55. Currently on losartan 100 mg daily, amlodipine 10 mg daily and metoprolol 100 mg bid   Constipation- currently on senokot s 2 tab qhs prn. Has not required any in last 2 weeks.   Dementia with behavioral disturbance- needs assistance with ADLs. Very hard of hearing. Mood overall stable. Needs redirection and reassurance.    Past Medical History:  Diagnosis Date  . Arthritis   . Cataracts, bilateral   . Closed fracture of unspecified part of lower end of humerus   . Dementia   . Hypertension   . Insomnia, unspecified   . Macular degeneration (senile) of retina, unspecified   . Osteoarthritis   . Osteoarthrosis, unspecified whether generalized or localized, unspecified site   . Pancreatitis, acute 08/14/2013  . Retinal neovascularization NOS   . Senile osteoporosis   . Subarachnoid hemorrhage following injury ([place]) 03/31/2012  . Unspecified hearing loss   . Unspecified hypothyroidism   . Urinary tract infection 08/10/2013  . Vertebral fracture 08/09/2013   T7 10%, T12 70%    Past Surgical History:  Procedure Laterality Date  . ABDOMINAL HYSTERECTOMY  1942  . ANKLE FRACTURE SURGERY  1960  . BLADDER SUSPENSION    . CLOSED REDUCTION WITH HUMERAL PIN INSERTION  04/01/2012   Procedure: CLOSED REDUCTION WITH HUMERAL PIN INSERTION;  Surgeon: Schuyler Amor, MD;  Location: [facility];  Service: Orthopedics;  Laterality: Left;    No Known Allergies  Outpatient Encounter Medications as of 10/15/2017  Medication Sig  . acetaminophen (TYLENOL) 500 MG tablet Take 500 mg by mouth every  6 (six) hours as needed. Pain  . amLODipine (NORVASC) 10 MG tablet Take 10 mg by mouth daily.  Marland Kitchen aspirin EC 81 MG tablet Take 81 mg by mouth daily.  . Cholecalciferol 1000 units capsule Take 2,000 Units by mouth daily.   Marland Kitchen  ketoconazole (NIZORAL) 2 % shampoo Apply 1 application topically once a week. On Wednesday  . levothyroxine (SYNTHROID, LEVOTHROID) 75 MCG tablet Take 75 mcg by mouth daily before breakfast.  . LORazepam (ATIVAN) 1 MG tablet Take 1 mg by mouth as needed for anxiety (Give 1 hour prior to dental appointment).  . losartan (COZAAR) 100 MG tablet Take 1 tablet (100 mg total) by mouth daily.  . metoprolol (LOPRESSOR) 100 MG tablet Take 100 mg by mouth 2 (two) times daily.   . Nutritional Supplements (BOOST GLUCOSE CONTROL) LIQD Take 237 mLs by mouth 2 (two) times daily.  . raloxifene (EVISTA) 60 MG tablet Take 60 mg by mouth daily.  Marland Kitchen senna-docusate (SENOKOT-S) 8.6-50 MG per tablet Take 2 tablets by mouth at bedtime as needed for moderate constipation.    No facility-administered encounter medications on file as of 10/15/2017.     Review of Systems  Reason unable to perform ROS: limited with dementia and hearing.    Immunization History  Administered Date(s) Administered  . Influenza-Unspecified 03/02/2013, 02/15/2014, 02/16/2015, 02/22/2016, 02/20/2017  . PPD Test 03/23/2012  . Pneumococcal Conjugate-13 12/17/2016  . Pneumococcal Polysaccharide-23 09/22/2002  . Td 11/17/2002  . Tdap 12/16/2016   Pertinent  Health Maintenance Due  Topic Date Due  . DEXA SCAN  12/29/1983  . INFLUENZA VACCINE  12/11/2017  . PNA vac Low Risk Adult  Completed   Fall Risk  12/13/2016 02/07/2015 02/22/2014 02/22/2014  Falls in the past year? No No Yes No  Number falls in past yr: - - 1 -  Risk for fall due to : - - History of fall(s) -   Functional Status Survey:    Vitals:   10/15/17 1357  BP: (!) 180/77  Pulse: 75  Resp: 18  Temp: (!) 96.5 F (35.8 C)  TempSrc: Oral  SpO2: 95%  Weight: 130 lb (59 kg)  Height: 5\' 1"  (1.549 m)   Body mass index is 24.56 kg/m.   Wt Readings from Last 3 Encounters:  10/15/17 130 lb (59 kg)  09/15/17 129 lb 14.4 oz (58.9 kg)  08/19/17 137 lb 14.4 oz (62.6 kg)    Physical Exam  Constitutional: She appears well-developed and well-nourished. No distress.  HENT:  Head: Normocephalic and atraumatic.  Mouth/Throat: Oropharynx is clear and moist.  Eyes: Pupils are equal, round, and reactive to light. Conjunctivae are normal. Right eye exhibits no discharge. Left eye exhibits no discharge.  Neck: Normal range of motion. Neck supple.  Cardiovascular: Normal rate, regular rhythm and intact distal pulses.  Pulmonary/Chest: Effort normal and breath sounds normal. No respiratory distress. She has no wheezes. She has no rales.  Abdominal: Soft. Bowel sounds are normal. There is no tenderness.  Genitourinary:  Genitourinary Comments: Incontinent with bowel and bladder  Musculoskeletal: She exhibits edema.  Able to move all 4 extremities, uses wheelchair and walker for ambulation and needs assistance with transfer  Lymphadenopathy:    She has no cervical adenopathy.  Neurological: She is alert.  Oriented only to self  Skin: Skin is warm and dry. She is not diaphoretic.  Psychiatric: She has a normal mood and affect. Her behavior is normal.    Labs reviewed: Recent Labs  01/23/17 03/27/17 09/01/17  NA 136* 139 135*  K 4.0 4.1 4.5  CL  --   --  100  CO2  --   --  27  BUN 18 19 24*  CREATININE 0.7 0.6 0.7  CALCIUM  --   --  8.8   Recent Labs    09/01/17  AST 14  ALT 11  ALKPHOS 54  PROT 6.0  ALBUMIN 3.1   Recent Labs    03/27/17 09/01/17  WBC 5.8 9.6  HGB 13.2 12.3  HCT 38 35*  PLT 180 243   Lab Results  Component Value Date   TSH 1.97 09/01/2017   Lab Results  Component Value Date   HGBA1C 6.7 07/18/2016   Lab Results  Component Value Date   CHOL 146 01/23/2017   HDL 39 01/23/2017   LDLCALC 76 01/23/2017   TRIG 214 (A) 01/23/2017    Significant Diagnostic Results in last 30 days:  No results found.  Assessment/Plan  1. Essential hypertension Controlled BP, no BP check per family request as pt gets agitated during  test. Continue losartan 100 mg daily, metoprolol 100 mg bid and amlodipine 10 mg daily and monitor. Reviewed bmp  2. Sensorineural hearing loss (SNHL) of both ears Supportive care  3. Acquired hypothyroidism . Lab Results  Component Value Date   TSH 1.97 09/01/2017   Continue levothyroxine  4. Vascular dementia with behavior disturbance Supportive care, continue antihypertensive  5. Osteoporosis with pathological fracture, sequela Continue vitamin d supplement and raloxifene. Fall prevention. Wheelchair and walker for ambulation and assistance with transfer.   6. Moderate protein-calorie malnutrition (HCC) Continue feeding supplement, monitor, decline anticipated with her advanced dementia  7. Slow transit constipation D/c senokot s for non use    Family/ staff Communication: reviewed care plan with patient and charge nurse.    Labs/tests ordered:  none   Blanchie Serve, MD Internal Medicine [facility] [address] [place], [place] [address] Cell Phone (Monday-Friday 8 am - 5 pm): [phone] On Call: [phone] and follow prompts after 5 pm and on weekends Office Phone: [phone] Office Fax: [phone]

## 2017-11-18 ENCOUNTER — Non-Acute Institutional Stay (SKILLED_NURSING_FACILITY): Payer: Medicare Other | Admitting: Family

## 2017-11-18 ENCOUNTER — Encounter: Payer: Self-pay | Admitting: Family

## 2017-11-18 DIAGNOSIS — E039 Hypothyroidism, unspecified: Secondary | ICD-10-CM | POA: Diagnosis not present

## 2017-11-18 DIAGNOSIS — F0151 Vascular dementia with behavioral disturbance: Secondary | ICD-10-CM | POA: Diagnosis not present

## 2017-11-18 DIAGNOSIS — E559 Vitamin D deficiency, unspecified: Secondary | ICD-10-CM | POA: Diagnosis not present

## 2017-11-18 DIAGNOSIS — I10 Essential (primary) hypertension: Secondary | ICD-10-CM

## 2017-11-18 DIAGNOSIS — F01518 Vascular dementia, unspecified severity, with other behavioral disturbance: Secondary | ICD-10-CM

## 2017-11-18 NOTE — Progress Notes (Signed)
Location:  [facility] Room Number: 2 Place of Service:  SNF (31) Provider: Iori Gigante FNP-C   Blanchie Serve, MD  Patient Care Team: Blanchie Serve, MD as PCP - General (Internal Medicine) Mast, Man X, NP as Nurse Practitioner (Nurse Practitioner) Charlotte Crumb, MD as Consulting Physician (Orthopedic Surgery) Wallene Huh, DPM as Consulting Physician (Podiatry) Gaynelle Arabian, MD as Consulting Physician (Orthopedic Surgery) Zebedee Iba., MD as Referring Physician (Ophthalmology) Newman Pies, MD as Consulting Physician (Neurosurgery) Melina Modena, [facility] (Skilled Nursing and [place])  Extended Emergency Contact Information Primary Emergency Contact: [place] of [place] Phone: [phone] Mobile Phone: [phone] Relation: Daughter Secondary Emergency Contact: [place] of [place] Phone: [phone] Relation: Grandson  Code Status:  DNR  Goals of care: Advanced Directive information Advanced Directives 11/18/2017  Does Patient Have a Medical Advance Directive? Yes  Type of Paramedic of [place];Out of facility DNR (pink MOST or yellow form);Living will  Does patient want to make changes to medical advance directive? -  Copy of [place] in Chart? Yes  Pre-existing out of facility DNR order (yellow form or pink MOST form) Yellow form placed in chart (order not valid for inpatient use);Pink MOST form placed in chart (order not valid for inpatient use)     Chief Complaint  Patient presents with  . Medical Management of Chronic Issues    HPI:  Pt is a 84 y.o. female seen today [place] for medical management of chronic diseases.she has a medical history HTN,hyothyroidism,CVA,GERD,OA,Osteoporosis,vascular dementia with behavioral disturbance among other conditions.she is seen in her room today.she is unable to provide HPI  and ROS due to her cognitive and hearing impairment.Facility Nurse states patient refuses to take her oral medication sometimes including her blood pressure medication.B/p readings reviewed SBP 180.no recent fall episode reported.Her weight stable.    Past Medical History:  Diagnosis Date  . Arthritis   . Cataracts, bilateral   . Closed fracture of unspecified part of lower end of humerus   . Dementia   . Hypertension   . Insomnia, unspecified   . Macular degeneration (senile) of retina, unspecified   . Osteoarthritis   . Osteoarthrosis, unspecified whether generalized or localized, unspecified site   . Pancreatitis, acute 08/14/2013  . Retinal neovascularization NOS   . Senile osteoporosis   . Subarachnoid hemorrhage following injury ([place]) 03/31/2012  . Unspecified hearing loss   . Unspecified hypothyroidism   . Urinary tract infection 08/10/2013  . Vertebral fracture 08/09/2013   T7 10%, T12 70%    Past Surgical History:  Procedure Laterality Date  . ABDOMINAL HYSTERECTOMY  1942  . ANKLE FRACTURE SURGERY  1960  . BLADDER SUSPENSION    . CLOSED REDUCTION WITH HUMERAL PIN INSERTION  04/01/2012   Procedure: CLOSED REDUCTION WITH HUMERAL PIN INSERTION;  Surgeon: Schuyler Amor, MD;  [facility]: [facility];  Service: Orthopedics;  Laterality: Left;    No Known Allergies  Allergies as of 11/18/2017   No Known Allergies     Medication List        Accurate as of 11/18/17 12:34 PM. Always use your most recent med list.          acetaminophen 500 MG tablet Commonly known as:  TYLENOL Take 500 mg by mouth every 6 (six) hours as needed. Pain   amLODipine 10 MG tablet Commonly known as:  NORVASC Take 10 mg by mouth daily.  aspirin EC 81 MG tablet Take 81 mg by mouth daily.   BOOST GLUCOSE CONTROL Liqd Take 237 mLs by mouth 2 (two) times daily.   Cholecalciferol 1000 units capsule Take 2,000 Units by mouth daily.   ketoconazole 2 % shampoo Commonly known as:  NIZORAL Apply  1 application topically once a week. On Wednesday   levothyroxine 75 MCG tablet Commonly known as:  SYNTHROID, LEVOTHROID Take 75 mcg by mouth daily before breakfast.   LORazepam 1 MG tablet Commonly known as:  ATIVAN Take 1 mg by mouth as needed for anxiety (Give 1 hour prior to dental appointment).   losartan 100 MG tablet Commonly known as:  COZAAR Take 1 tablet (100 mg total) by mouth daily.   metoprolol tartrate 100 MG tablet Commonly known as:  LOPRESSOR Take 100 mg by mouth 2 (two) times daily.   raloxifene 60 MG tablet Commonly known as:  EVISTA Take 60 mg by mouth daily.       Review of Systems  Unable to perform ROS: Dementia (additional information provided by facility Nurse)    Immunization History  Administered Date(s) Administered  . Influenza-Unspecified 03/02/2013, 02/15/2014, 02/16/2015, 02/22/2016, 02/20/2017  . PPD Test 03/23/2012  . Pneumococcal Conjugate-13 12/17/2016  . Pneumococcal Polysaccharide-23 09/22/2002  . Td 11/17/2002  . Tdap 12/16/2016   Pertinent  Health Maintenance Due  Topic Date Due  . DEXA SCAN  12/29/1983  . INFLUENZA VACCINE  12/11/2017  . PNA vac Low Risk Adult  Completed   Fall Risk  12/13/2016 02/07/2015 02/22/2014 02/22/2014  Falls in the past year? No No Yes No  Number falls in past yr: - - 1 -  Risk for fall due to : - - History of fall(s) -   Functional Status Survey:    Vitals:   11/18/17 1057  BP: (!) 180/77  Pulse: 88  Resp: (!) 23  Temp: 97.9 F (36.6 C)  SpO2: 95%  Weight: 132 lb (59.9 kg)  Height: 5\' 1"  (1.549 m)   Body mass index is 24.94 kg/m. Physical Exam  Constitutional:  Frail elderly in no acute distress   HENT:  Head: Normocephalic.  Mouth/Throat: Oropharynx is clear and moist. No oropharyngeal exudate.  Eyes: Pupils are equal, round, and reactive to light. Conjunctivae and EOM are normal. Right eye exhibits no discharge. Left eye exhibits no discharge. No scleral icterus.  Neck: Normal  range of motion. No JVD present. No thyromegaly present.  Cardiovascular: Normal rate, regular rhythm, normal heart sounds and intact distal pulses. Exam reveals no gallop and no friction rub.  No murmur heard. Pulmonary/Chest: Effort normal and breath sounds normal. No respiratory distress. She has no wheezes. She has no rales.  Abdominal: Soft. Bowel sounds are normal. She exhibits no distension and no mass. There is no tenderness. There is no rebound and no guarding.  Musculoskeletal: She exhibits no edema or tenderness.  Unsteady gait ambulates with front wheel walker.moves x 4 extremities.   Lymphadenopathy:    She has no cervical adenopathy.  Neurological: Gait normal.  Alert but pleasantly confused at her baseline.   Skin: Skin is warm and dry. No rash noted. No erythema. No pallor.  Skin intact   Psychiatric: She has a normal mood and affect. Her speech is normal. Cognition and memory are impaired.    Labs reviewed: Recent Labs    01/23/17 03/27/17 09/01/17  NA 136* 139 135*  K 4.0 4.1 4.5  CL  --   --  100  CO2  --   --  27  BUN 18 19 24*  CREATININE 0.7 0.6 0.7  CALCIUM  --   --  8.8   Recent Labs    09/01/17  AST 14  ALT 11  ALKPHOS 54  PROT 6.0  ALBUMIN 3.1   Recent Labs    03/27/17 09/01/17  WBC 5.8 9.6  HGB 13.2 12.3  HCT 38 35*  PLT 180 243   Lab Results  Component Value Date   TSH 1.97 09/01/2017   Lab Results  Component Value Date   HGBA1C 6.7 07/18/2016   Lab Results  Component Value Date   CHOL 146 01/23/2017   HDL 39 01/23/2017   LDLCALC 76 01/23/2017   TRIG 214 (A) 01/23/2017    Significant Diagnostic Results in last 30 days:  No results found.  Assessment/Plan 1. Essential hypertension B/p reading reviewed latest SBP 180.has been refusing her blood pressure medication at times.continue on losartan 100 mg tablet daily,amlodipine 10 mg tablet daily and metoprolol 100 mg tablet twice daily. Continue to encourage to take her  medication.continue to monitor B/p and BMP.   2. Hypothyroidism Lab Results  Component Value Date   TSH 1.97 09/01/2017  Continue on levothyroxine 75 mcg tablet daily.continue to monitor TSh level.   3. Vitamin D deficiency Continue on vitamin D supplements  4. Vascular dementia with behavior disturbance Refuses her medications.no combativeness reported.continue with supportive care.continue on Ativan 1 mg tablet as needed.  Family/ staff Communication: Reviewed plan of care with patient and facility Nurse.   Labs/tests ordered: None   Royelle Hinchman C Laysha Childers, NP

## 2017-11-24 ENCOUNTER — Non-Acute Institutional Stay (SKILLED_NURSING_FACILITY): Payer: Medicare Other | Admitting: Family

## 2017-11-24 ENCOUNTER — Encounter: Payer: Self-pay | Admitting: Family

## 2017-11-24 DIAGNOSIS — R451 Restlessness and agitation: Secondary | ICD-10-CM

## 2017-11-24 DIAGNOSIS — F01518 Vascular dementia, unspecified severity, with other behavioral disturbance: Secondary | ICD-10-CM

## 2017-11-24 DIAGNOSIS — F0151 Vascular dementia with behavioral disturbance: Secondary | ICD-10-CM | POA: Diagnosis not present

## 2017-11-24 MED ORDER — QUETIAPINE FUMARATE 25 MG PO TABS: 12.5000 mg | ORAL_TABLET | Freq: Every day | ORAL | Status: DC

## 2017-11-24 NOTE — Progress Notes (Signed)
Location:  [place] Room Number: 2 Place of Service:  SNF (31) Provider: Dinah Ngetich FNP-C  Blanchie Serve, MD  Patient Care Team: Blanchie Serve, MD as PCP - General (Internal Medicine) Mast, Man X, NP as Nurse Practitioner (Nurse Practitioner) Charlotte Crumb, MD as Consulting Physician (Orthopedic Surgery) Wallene Huh, DPM as Consulting Physician (Podiatry) Gaynelle Arabian, MD as Consulting Physician (Orthopedic Surgery) Zebedee Iba., MD as Referring Physician (Ophthalmology) Newman Pies, MD as Consulting Physician (Neurosurgery) Melina Modena, [facility] (Skilled Nursing and [place])  Extended Emergency Contact Information Primary Emergency Contact: [place] of [place] Phone: [phone] Mobile Phone: [phone] Relation: Daughter Secondary Emergency Contact: [place] of [place] Phone: [phone] Relation: Grandson  Code Status:  DNR Goals of care: Advanced Directive information Advanced Directives 11/24/2017  Does Patient Have a Medical Advance Directive? Yes  Type of Paramedic of [place];Out of facility DNR (pink MOST or yellow form);Living will  Does patient want to make changes to medical advance directive? -  Copy of [place] in Chart? Yes  Pre-existing out of facility DNR order (yellow form or pink MOST form) Yellow form placed in chart (order not valid for inpatient use);Pink MOST form placed in chart (order not valid for inpatient use)     Chief Complaint  Patient presents with  . Acute Visit    combative; skin tear    HPI:  Pt is a 84 y.o. female seen today at [facility] for an acute visit for evaluation of left hand skin tear and worsening agitation. She is seen in her room today per facility Nurse SBAR report.Nurse reports patient has had increased combative behavior hitting staff during care,resist  care and medication.she sustained skin tear when trying to hit Nursing staff.she has been re-directed without any success.No signs of infections or symptoms of urinary tract infection reported.     Past Medical History:  Diagnosis Date  . Arthritis   . Cataracts, bilateral   . Closed fracture of unspecified part of lower end of humerus   . Dementia   . Hypertension   . Insomnia, unspecified   . Macular degeneration (senile) of retina, unspecified   . Osteoarthritis   . Osteoarthrosis, unspecified whether generalized or localized, unspecified site   . Pancreatitis, acute 08/14/2013  . Retinal neovascularization NOS   . Senile osteoporosis   . Subarachnoid hemorrhage following injury ([place]) 03/31/2012  . Unspecified hearing loss   . Unspecified hypothyroidism   . Urinary tract infection 08/10/2013  . Vertebral fracture 08/09/2013   T7 10%, T12 70%    Past Surgical History:  Procedure Laterality Date  . ABDOMINAL HYSTERECTOMY  1942  . ANKLE FRACTURE SURGERY  1960  . BLADDER SUSPENSION    . CLOSED REDUCTION WITH HUMERAL PIN INSERTION  04/01/2012   Procedure: CLOSED REDUCTION WITH HUMERAL PIN INSERTION;  Surgeon: Schuyler Amor, MD;  Location: [facility];  Service: Orthopedics;  Laterality: Left;    No Known Allergies  Outpatient Encounter Medications as of 11/24/2017  Medication Sig  . acetaminophen (TYLENOL) 500 MG tablet Take 500 mg by mouth every 6 (six) hours as needed. Pain  . amLODipine (NORVASC) 10 MG tablet Take 10 mg by mouth daily.  Marland Kitchen aspirin EC 81 MG tablet Take 81 mg by mouth daily.  . Cholecalciferol 1000 units capsule Take 2,000 Units by mouth daily.   Marland Kitchen ketoconazole (NIZORAL) 2 % shampoo Apply 1 application  topically once a week. On Wednesday  . levothyroxine (SYNTHROID, LEVOTHROID) 75 MCG tablet Take 75 mcg by mouth daily before breakfast.  . LORazepam (ATIVAN) 1 MG tablet Take 1 mg by mouth as needed for anxiety (Give 1 hour prior to dental appointment).  . losartan  (COZAAR) 100 MG tablet Take 1 tablet (100 mg total) by mouth daily.  . metoprolol (LOPRESSOR) 100 MG tablet Take 100 mg by mouth 2 (two) times daily.   . Nutritional Supplements (BOOST GLUCOSE CONTROL) LIQD Take 237 mLs by mouth 2 (two) times daily.  . raloxifene (EVISTA) 60 MG tablet Take 60 mg by mouth daily.  . QUEtiapine (SEROQUEL) 25 MG tablet Take 0.5 tablets (12.5 mg total) by mouth at bedtime.   No facility-administered encounter medications on file as of 11/24/2017.     Review of Systems  Unable to perform ROS: Dementia (additional information provided by facility Nurse )    Immunization History  Administered Date(s) Administered  . Influenza-Unspecified 03/02/2013, 02/15/2014, 02/16/2015, 02/22/2016, 02/20/2017  . PPD Test 03/23/2012  . Pneumococcal Conjugate-13 12/17/2016  . Pneumococcal Polysaccharide-23 09/22/2002  . Td 11/17/2002  . Tdap 12/16/2016   Pertinent  Health Maintenance Due  Topic Date Due  . DEXA SCAN  12/29/1983  . INFLUENZA VACCINE  12/11/2017  . PNA vac Low Risk Adult  Completed   Fall Risk  12/13/2016 02/07/2015 02/22/2014 02/22/2014  Falls in the past year? No No Yes No  Number falls in past yr: - - 1 -  Risk for fall due to : - - History of fall(s) -   Functional Status Survey:    Vitals:   11/24/17 1402  Weight: 132 lb (59.9 kg)  Height: 5\' 1"  (1.549 m)   Body mass index is 24.94 kg/m. Physical Exam  Constitutional:  Frail elderly in no acute distress   HENT:  Head: Normocephalic.  Mouth/Throat: Oropharynx is clear and moist. No oropharyngeal exudate.  Eyes: Pupils are equal, round, and reactive to light. Conjunctivae and EOM are normal. Right eye exhibits no discharge. Left eye exhibits no discharge. No scleral icterus.  Neck: Normal range of motion. No JVD present. No thyromegaly present.  Cardiovascular: Normal rate, regular rhythm, normal heart sounds and intact distal pulses. Exam reveals no gallop and no friction rub.  No murmur  heard. Pulmonary/Chest: Effort normal and breath sounds normal. No stridor. No respiratory distress. She has no wheezes. She has no rales.  Abdominal: Soft. Bowel sounds are normal. She exhibits no distension and no mass. There is no tenderness. There is no rebound and no guarding.  Musculoskeletal: She exhibits no edema or tenderness.  Moves x 4 extremities unsteady gait ambulates with FWW.  Lymphadenopathy:    She has no cervical adenopathy.  Neurological:  Pleasantly confused at her baseline.  Nursing note and vitals reviewed.   Labs reviewed: Recent Labs    01/23/17 03/27/17 09/01/17  NA 136* 139 135*  K 4.0 4.1 4.5  CL  --   --  100  CO2  --   --  27  BUN 18 19 24*  CREATININE 0.7 0.6 0.7  CALCIUM  --   --  8.8   Recent Labs    09/01/17  AST 14  ALT 11  ALKPHOS 54  PROT 6.0  ALBUMIN 3.1   Recent Labs    03/27/17 09/01/17  WBC 5.8 9.6  HGB 13.2 12.3  HCT 38 35*  PLT 180 243   Lab Results  Component Value Date  TSH 1.97 09/01/2017   Lab Results  Component Value Date   HGBA1C 6.7 07/18/2016   Lab Results  Component Value Date   CHOL 146 01/23/2017   HDL 39 01/23/2017   LDLCALC 76 01/23/2017   TRIG 214 (A) 01/23/2017    Significant Diagnostic Results in last 30 days:  No results found.  Assessment/Plan 1. Agitation Afebrile.combative,resist care and refuses medication at times.Start Seroquel 25 mg tablet take 12.5 mg tablet (1/2 tablet) daily at bedtime.continue to reoriented as possible.will increase to 25 mg tablet at bedtime if still combative.   2. Vascular dementia with behavior disturbance Combative towards facility staff,resitant to care and refuses her medication.Seroquel 12.5 mg tablet at bedtime as above.continue supportive care.  Family/ staff Communication: Reviewed plan of care with patient and facility Nurse supervisor  Labs/tests ordered: None   Sandrea Hughs, NP

## 2017-11-27 DIAGNOSIS — H6121 Impacted cerumen, right ear: Secondary | ICD-10-CM | POA: Diagnosis not present

## 2017-12-17 ENCOUNTER — Non-Acute Institutional Stay (SKILLED_NURSING_FACILITY): Payer: Medicare Other | Admitting: Family

## 2017-12-17 ENCOUNTER — Encounter: Payer: Self-pay | Admitting: Family

## 2017-12-17 DIAGNOSIS — M8000XS Age-related osteoporosis with current pathological fracture, unspecified site, sequela: Secondary | ICD-10-CM

## 2017-12-17 DIAGNOSIS — I10 Essential (primary) hypertension: Secondary | ICD-10-CM

## 2017-12-17 DIAGNOSIS — F01518 Vascular dementia, unspecified severity, with other behavioral disturbance: Secondary | ICD-10-CM

## 2017-12-17 DIAGNOSIS — E039 Hypothyroidism, unspecified: Secondary | ICD-10-CM | POA: Diagnosis not present

## 2017-12-17 DIAGNOSIS — F0151 Vascular dementia with behavioral disturbance: Secondary | ICD-10-CM

## 2017-12-17 DIAGNOSIS — Z8673 Personal history of transient ischemic attack (TIA), and cerebral infarction without residual deficits: Secondary | ICD-10-CM

## 2017-12-17 NOTE — Progress Notes (Signed)
Location:  [place] Room Number: 2 Place of Service:  SNF (31) Provider: Mando Blatz FNP-C   Blanchie Serve, MD  Patient Care Team: Blanchie Serve, MD as PCP - General (Internal Medicine) Mast, Man X, NP as Nurse Practitioner (Nurse Practitioner) Charlotte Crumb, MD as Consulting Physician (Orthopedic Surgery) Wallene Huh, DPM as Consulting Physician (Podiatry) Gaynelle Arabian, MD as Consulting Physician (Orthopedic Surgery) Zebedee Iba., MD as Referring Physician (Ophthalmology) Newman Pies, MD as Consulting Physician (Neurosurgery) Melina Modena, [facility] (Skilled Nursing and [place])  Extended Emergency Contact Information Primary Emergency Contact: [place] of [place] Phone: [phone] Mobile Phone: [phone] Relation: Daughter Secondary Emergency Contact: [place] of [place] Phone: [phone] Relation: Grandson  Code Status:  DNR Goals of care: Advanced Directive information Advanced Directives 12/17/2017  Does Patient Have a Medical Advance Directive? Yes  Type of Paramedic of [place];Out of facility DNR (pink MOST or yellow form);Living will  Does patient want to make changes to medical advance directive? -  Copy of [place] in Chart? Yes  Pre-existing out of facility DNR order (yellow form or pink MOST form) Yellow form placed in chart (order not valid for inpatient use);Pink MOST form placed in chart (order not valid for inpatient use)     Chief Complaint  Patient presents with  . Medical Management of Chronic Issues    HPI:  Pt is a 84 y.o. female seen today [facility] for medical management of chronic diseases.she has a medical history of HTN,Hypothyroidism,Hx of CVA, OA,Osteoporosis,dementia among other conditions.she seen in her room today with facility nurse present at bedside.patient very cooperative  this visit and answers questions appropriately.she also stayed calm while facility Nurse checked her blood pressure.Nurse reports no new behavioral issues.No recent acute illness.she has had no recent fall episode.she has had a 4 pounds weight loss over one month.Her oral intake varies from 25-75% of meals.Also drinks her protein supplements.     Past Medical History:  Diagnosis Date  . Arthritis   . Cataracts, bilateral   . Closed fracture of unspecified part of lower end of humerus   . Dementia   . Hypertension   . Insomnia, unspecified   . Macular degeneration (senile) of retina, unspecified   . Osteoarthritis   . Osteoarthrosis, unspecified whether generalized or localized, unspecified site   . Pancreatitis, acute 08/14/2013  . Retinal neovascularization NOS   . Senile osteoporosis   . Subarachnoid hemorrhage following injury ([place]) 03/31/2012  . Unspecified hearing loss   . Unspecified hypothyroidism   . Urinary tract infection 08/10/2013  . Vertebral fracture 08/09/2013   T7 10%, T12 70%    Past Surgical History:  Procedure Laterality Date  . ABDOMINAL HYSTERECTOMY  1942  . ANKLE FRACTURE SURGERY  1960  . BLADDER SUSPENSION    . CLOSED REDUCTION WITH HUMERAL PIN INSERTION  04/01/2012   Procedure: CLOSED REDUCTION WITH HUMERAL PIN INSERTION;  Surgeon: Schuyler Amor, MD;  Location: [facility];  Service: Orthopedics;  Laterality: Left;    No Known Allergies  Allergies as of 12/17/2017   No Known Allergies     Medication List        Accurate as of 12/17/17 11:30 AM. Always use your most recent med list.          acetaminophen 500 MG tablet Commonly known as:  TYLENOL Take 500 mg by mouth every 6 (six) hours as  needed. Pain   amLODipine 10 MG tablet Commonly known as:  NORVASC Take 10 mg by mouth daily.   aspirin EC 81 MG tablet Take 81 mg by mouth daily.   BOOST GLUCOSE CONTROL Liqd Take 237 mLs by mouth 2 (two) times daily.   Cholecalciferol 1000 units  capsule Take 2,000 Units by mouth daily.   ketoconazole 2 % shampoo Commonly known as:  NIZORAL Apply 1 application topically once a week. On Wednesday   levothyroxine 75 MCG tablet Commonly known as:  SYNTHROID, LEVOTHROID Take 75 mcg by mouth daily before breakfast.   LORazepam 1 MG tablet Commonly known as:  ATIVAN Take 1 mg by mouth as needed for anxiety (Give 1 hour prior to dental appointment).   losartan 100 MG tablet Commonly known as:  COZAAR Take 1 tablet (100 mg total) by mouth daily.   metoprolol tartrate 100 MG tablet Commonly known as:  LOPRESSOR Take 100 mg by mouth 2 (two) times daily.   QUEtiapine 25 MG tablet Commonly known as:  SEROQUEL Take 0.5 tablets (12.5 mg total) by mouth at bedtime.   raloxifene 60 MG tablet Commonly known as:  EVISTA Take 60 mg by mouth daily.       Review of Systems  Unable to perform ROS: Dementia (additional information provided by facility nurse)  Constitutional: Negative for chills, fatigue and fever.       4 pound weight loss over one month  HENT: Positive for hearing loss. Negative for congestion, rhinorrhea, sinus pressure, sinus pain, sneezing and sore throat.   Eyes: Positive for visual disturbance. Negative for pain, discharge and redness.       Hx of macular degeneration.Wears eye glasses  Respiratory: Negative for cough, chest tightness, shortness of breath and wheezing.   Cardiovascular: Negative for chest pain, palpitations and leg swelling.  Gastrointestinal: Negative for abdominal distention, abdominal pain, constipation, diarrhea, nausea and vomiting.  Endocrine: Negative for cold intolerance, heat intolerance, polydipsia, polyphagia and polyuria.  Genitourinary: Negative for dysuria, flank pain and urgency.       Incontinent   Musculoskeletal: Positive for gait problem.  Skin: Negative for color change, pallor, rash and wound.  Neurological: Negative for dizziness, light-headedness and headaches.   Hematological: Does not bruise/bleed easily.  Psychiatric/Behavioral: Positive for confusion. Negative for sleep disturbance. The patient is not nervous/anxious.        Agitation has improved with Seroquel 12.5 mg tablet at bedtime.    Immunization History  Administered Date(s) Administered  . Influenza-Unspecified 03/02/2013, 02/15/2014, 02/16/2015, 02/22/2016, 02/20/2017  . PPD Test 03/23/2012  . Pneumococcal Conjugate-13 12/17/2016  . Pneumococcal Polysaccharide-23 09/22/2002  . Td 11/17/2002  . Tdap 12/16/2016   Pertinent  Health Maintenance Due  Topic Date Due  . DEXA SCAN  12/29/1983  . INFLUENZA VACCINE  12/11/2017  . PNA vac Low Risk Adult  Completed   Fall Risk  12/13/2016 02/07/2015 02/22/2014 02/22/2014  Falls in the past year? No No Yes No  Number falls in past yr: - - 1 -  Risk for fall due to : - - History of fall(s) -   Functional Status Survey: Is the patient deaf or have difficulty hearing?: Yes Does the patient have difficulty seeing, even when wearing glasses/contacts?: Yes Does the patient have difficulty concentrating, remembering, or making decisions?: Yes Does the patient have difficulty walking or climbing stairs?: Yes Does the patient have difficulty dressing or bathing?: Yes Does the patient have difficulty doing errands alone such as visiting a doctor's  office or shopping?: Yes  Vitals:   12/17/17 0943  BP: (!) 126/56  Pulse: 76  Resp: 18  Temp: (!) 97.4 F (36.3 C)  Weight: 128 lb (58.1 kg)  Height: 5\' 1"  (1.549 m)   Body mass index is 24.19 kg/m. Physical Exam  Constitutional:  Frail elderly vibrant during visit making jokes.  HENT:  Head: Normocephalic.  Right Ear: External ear normal.  Left Ear: External ear normal.  Mouth/Throat: Oropharynx is clear and moist. No oropharyngeal exudate.  Eyes: Pupils are equal, round, and reactive to light. Conjunctivae and EOM are normal. Right eye exhibits no discharge. Left eye exhibits no  discharge. No scleral icterus.  Neck: Normal range of motion. No JVD present.  Cardiovascular: Normal rate, regular rhythm, normal heart sounds and intact distal pulses. Exam reveals no gallop and no friction rub.  No murmur heard. Pulmonary/Chest: Effort normal and breath sounds normal. No respiratory distress. She has no wheezes. She has no rales.  Abdominal: Soft. Bowel sounds are normal. She exhibits no distension and no mass. There is no tenderness. There is no rebound and no guarding.  Genitourinary:  Genitourinary Comments: Incontinent   Musculoskeletal: She exhibits no tenderness.  Moves x 4 extremities.unsteady gait ambulates with front wheel walker.Left leg chronic non-pitting edema.  Lymphadenopathy:    She has no cervical adenopathy.  Neurological: Gait abnormal.  Pleasantly confused.  Skin: Skin is warm and dry. Capillary refill takes 2 to 3 seconds. No rash noted. No erythema. No pallor.  Psychiatric: She has a normal mood and affect. Her speech is normal and behavior is normal. Judgment and thought content normal. She exhibits abnormal recent memory.  Nursing note and vitals reviewed.   Labs reviewed: Recent Labs    01/23/17 03/27/17 09/01/17  NA 136* 139 135*  K 4.0 4.1 4.5  CL  --   --  100  CO2  --   --  27  BUN 18 19 24*  CREATININE 0.7 0.6 0.7  CALCIUM  --   --  8.8   Recent Labs    09/01/17  AST 14  ALT 11  ALKPHOS 54  PROT 6.0  ALBUMIN 3.1   Recent Labs    03/27/17 09/01/17  WBC 5.8 9.6  HGB 13.2 12.3  HCT 38 35*  PLT 180 243   Lab Results  Component Value Date   TSH 1.97 09/01/2017   Lab Results  Component Value Date   HGBA1C 6.7 07/18/2016   Lab Results  Component Value Date   CHOL 146 01/23/2017   HDL 39 01/23/2017   LDLCALC 76 01/23/2017   TRIG 214 (A) 01/23/2017    Significant Diagnostic Results in last 30 days:  No results found.  Assessment/Plan 1. Essential hypertension Refuses blood pressure check but was calm during  visit B/p stable.continue on metoprolol 100 mg tablet twice daily,losartan 100 mg tablet daily and amlodipine 10 mg tablet daily.on ASA for chest pain prophylaxis.continue to monitor.   2. Hypothyroidism Lab Results  Component Value Date   TSH 1.97 09/01/2017  Continue on levothyroxine 75 mcg tablet daily.monitor TSH level.   3. Vascular dementia with behavior disturbance Her agitation and combative behaviors has improved with low dose of Seroquel initiated 11/24/2017.continue to monitor.   4. History of CVA (cerebrovascular accident) Continue on aspirin 81 mg tablet daily for prophylaxis.   5. Osteoporosis  No recent falls or fracture.continue on raloxifene 60 mg tablet daily and cholecalciferol 2000 units capsule daily.    Family/  staff Communication: Reviewed plan of care with patient and facility Nurse.    Labs/tests ordered: None   Limuel Nieblas C Sylvester Minton, NP

## 2017-12-24 ENCOUNTER — Encounter: Payer: Self-pay | Admitting: Internal Medicine

## 2017-12-24 ENCOUNTER — Non-Acute Institutional Stay (SKILLED_NURSING_FACILITY): Payer: Medicare Other

## 2017-12-24 DIAGNOSIS — Z0000 Encounter for general adult medical examination without abnormal findings: Secondary | ICD-10-CM

## 2017-12-24 NOTE — Progress Notes (Signed)
Subjective:   Melissa Carroll is a 84 y.o. female who presents for Medicare Annual (Subsequent) preventive examination at [place]; incapacitated patient unable to answer questions appropriately   Last AWV-12/13/2016    Objective:     Vitals: BP 125/62 (BP Location: Left Arm, Patient Position: Sitting)   Pulse 75   Temp (!) 97.5 F (36.4 C) (Oral)   Ht 5\' 1"  (1.549 m)   Wt 128 lb (58.1 kg)   BMI 24.19 kg/m   Body mass index is 24.19 kg/m.  Advanced Directives 12/24/2017 12/17/2017 11/24/2017 11/18/2017 10/15/2017 09/15/2017 08/19/2017  Does Patient Have a Medical Advance Directive? Yes Yes Yes Yes Yes Yes Yes  Type of Paramedic of [place];Out of facility DNR (pink MOST or yellow form);Living will [place];Out of facility DNR (pink MOST or yellow form);Living will [place];Out of facility DNR (pink MOST or yellow form);Living will [place];Out of facility DNR (pink MOST or yellow form);Living will [place];Living will;Out of facility DNR (pink MOST or yellow form) [place];Out of facility DNR (pink MOST or yellow form);Living will [place];Out of facility DNR (pink MOST or yellow form);Living will  Does patient want to make changes to medical advance directive? No - Patient declined - - - No - Patient declined - -  Copy of [place] in Chart? Yes Yes Yes Yes Yes Yes Yes  Pre-existing out of facility DNR order (yellow form or pink MOST form) Yellow form placed in chart (order not valid for inpatient use);Pink MOST form placed in chart (order not valid for inpatient use) Yellow form placed in chart (order not valid for inpatient use);Pink MOST form placed in chart (order not valid for inpatient use) Yellow form placed in chart (order not valid for inpatient use);Pink MOST form placed in chart (order not valid for  inpatient use) Yellow form placed in chart (order not valid for inpatient use);Pink MOST form placed in chart (order not valid for inpatient use) Yellow form placed in chart (order not valid for inpatient use);Pink MOST form placed in chart (order not valid for inpatient use) Yellow form placed in chart (order not valid for inpatient use);Pink MOST form placed in chart (order not valid for inpatient use) Yellow form placed in chart (order not valid for inpatient use);Pink MOST form placed in chart (order not valid for inpatient use)    Tobacco Social History   Tobacco Use  Smoking Status Never Smoker  Smokeless Tobacco Never Used     Counseling given: Not Answered   Clinical Intake:  Pre-visit preparation completed: No  Pain : No/denies pain     Nutritional Risks: None Diabetes: No  How often do you need to have someone help you when you read instructions, pamphlets, or other written materials from your doctor or pharmacy?: 3 - Sometimes What is the last grade level you completed in school?: College  Interpreter Needed?: No  Information entered by :: Tyson Dense, RN  Past Medical History:  Diagnosis Date  . Arthritis   . Cataracts, bilateral   . Closed fracture of unspecified part of lower end of humerus   . Dementia   . Hypertension   . Insomnia, unspecified   . Macular degeneration (senile) of retina, unspecified   . Osteoarthritis   . Osteoarthrosis, unspecified whether generalized or localized, unspecified site   . Pancreatitis, acute 08/14/2013  .  Retinal neovascularization NOS   . Senile osteoporosis   . Subarachnoid hemorrhage following injury ([place]) 03/31/2012  . Unspecified hearing loss   . Unspecified hypothyroidism   . Urinary tract infection 08/10/2013  . Vertebral fracture 08/09/2013   T7 10%, T12 70%    Past Surgical History:  Procedure Laterality Date  . ABDOMINAL HYSTERECTOMY  1942  . ANKLE FRACTURE SURGERY  1960  . BLADDER SUSPENSION    . CLOSED  REDUCTION WITH HUMERAL PIN INSERTION  04/01/2012   Procedure: CLOSED REDUCTION WITH HUMERAL PIN INSERTION;  Surgeon: Schuyler Amor, MD;  Location: [facility];  Service: Orthopedics;  Laterality: Left;   Family History  Problem Relation Age of Onset  . Hypertension Mother    Social History   Socioeconomic History  . Marital status: Widowed    Spouse name: Not on file  . Number of children: Not on file  . Years of education: Not on file  . Highest education level: Not on file  Occupational History  . Occupation: homemaker  Social Needs  . Financial resource strain: Not hard at all  . Food insecurity:    Worry: Never true    Inability: Never true  . Transportation needs:    Medical: No    Non-medical: No  Tobacco Use  . Smoking status: Never Smoker  . Smokeless tobacco: Never Used  Substance and Sexual Activity  . Alcohol use: No  . Drug use: No  . Sexual activity: Never  Lifestyle  . Physical activity:    Days per week: 3 days    Minutes per session: 30 min  . Stress: Not on file  Relationships  . Social connections:    Talks on phone: Not on file    Gets together: Not on file    Attends religious service: Not on file    Active member of club or organization: Not on file    Attends meetings of clubs or organizations: Not on file    Relationship status: Widowed  Other Topics Concern  . Not on file  Social History Narrative   Lives at [facility] since 2013, [place] to [facility]/26/16   Widowed   Never smoked   Alcohol none   Exercise none   Walks with walker   DNR, POA,  Living Will                   Outpatient Encounter Medications as of 12/24/2017  Medication Sig  . acetaminophen (TYLENOL) 500 MG tablet Take 500 mg by mouth every 6 (six) hours as needed. Pain  . amLODipine (NORVASC) 10 MG tablet Take 10 mg by mouth daily.  Marland Kitchen aspirin EC 81 MG tablet Take 81 mg by mouth daily.  . Cholecalciferol 1000 units capsule Take 2,000 Units by mouth daily.   Marland Kitchen ketoconazole  (NIZORAL) 2 % shampoo Apply 1 application topically once a week. On Wednesday  . levothyroxine (SYNTHROID, LEVOTHROID) 75 MCG tablet Take 75 mcg by mouth daily before breakfast.  . LORazepam (ATIVAN) 1 MG tablet Take 1 mg by mouth as needed for anxiety (Give 1 hour prior to dental appointment).  . losartan (COZAAR) 100 MG tablet Take 1 tablet (100 mg total) by mouth daily.  . metoprolol (LOPRESSOR) 100 MG tablet Take 100 mg by mouth 2 (two) times daily.   . Nutritional Supplements (BOOST GLUCOSE CONTROL) LIQD Take 237 mLs by mouth 2 (two) times daily.  . QUEtiapine (SEROQUEL) 25 MG tablet Take 0.5 tablets (12.5 mg total) by  mouth at bedtime.  . raloxifene (EVISTA) 60 MG tablet Take 60 mg by mouth daily.   No facility-administered encounter medications on file as of 12/24/2017.     Activities of Daily Living In your present state of health, do you have any difficulty performing the following activities: 12/24/2017 12/17/2017  Hearing? Tempie Donning  Vision? Y Y  Difficulty concentrating or making decisions? Tempie Donning  Walking or climbing stairs? Y Y  Dressing or bathing? Y Y  Doing errands, shopping? Tempie Donning  Preparing Food and eating ? Y -  Using the Toilet? Y -  In the past six months, have you accidently leaked urine? Y -  Do you have problems with loss of bowel control? Y -  Managing your Medications? Y -  Managing your Finances? Y -  Housekeeping or managing your Housekeeping? Y -  Some recent data might be hidden    Patient Care Team: Blanchie Serve, MD as PCP - General (Internal Medicine) Mast, Man X, NP as Nurse Practitioner (Nurse Practitioner) Charlotte Crumb, MD as Consulting Physician (Orthopedic Surgery) Regal, Tamala Fothergill, DPM as Consulting Physician (Podiatry) Gaynelle Arabian, MD as Consulting Physician (Orthopedic Surgery) Zebedee Iba., MD as Referring Physician (Ophthalmology) Newman Pies, MD as Consulting Physician (Neurosurgery) Melina Modena, [facility]    Assessment:   This is a routine wellness examination for Melissa Carroll.  Exercise Activities and Dietary recommendations Current Exercise Habits: Structured exercise class, Type of exercise: stretching, Time (Minutes): 30, Frequency (Times/Week): 3, Weekly Exercise (Minutes/Week): 90, Intensity: Mild, Exercise limited by: neurologic condition(s);orthopedic condition(s)  Goals   None     Fall Risk Fall Risk  12/24/2017 12/13/2016 02/07/2015 02/22/2014 02/22/2014  Falls in the past year? Yes No No Yes No  Number falls in past yr: 1 - - 1 -  Injury with Fall? Yes - - - -  Risk for fall due to : - - - History of fall(s) -   Is the patient's home free of loose throw rugs in walkways, pet beds, electrical cords, etc?   yes      Grab bars in the bathroom? yes      Handrails on the stairs?   yes      Adequate lighting?   yes  Timed Get Up and Go performed: 23 seconds  Depression Screen PHQ 2/9 Scores 12/24/2017 12/13/2016 02/22/2014 02/22/2014  PHQ - 2 Score 0 - 0 0  Exception Documentation - Medical reason - -     Cognitive Function MMSE - Mini Mental State Exam 12/24/2017 12/13/2016  Not completed: Unable to complete Unable to complete        Immunization History  Administered Date(s) Administered  . Influenza-Unspecified 03/02/2013, 02/15/2014, 02/16/2015, 02/22/2016, 02/20/2017  . PPD Test 03/23/2012  . Pneumococcal Conjugate-13 12/17/2016  . Pneumococcal Polysaccharide-23 09/22/2002  . Td 11/17/2002  . Tdap 12/16/2016    Qualifies for Shingles Vaccine? Not in past records  Screening Tests Health Maintenance  Topic Date Due  . INFLUENZA VACCINE  12/11/2017  . DEXA SCAN  01/17/2018 (Originally 12/29/1983)  . TETANUS/TDAP  12/17/2026  . PNA vac Low Risk Adult  Completed    Cancer Screenings: Lung: Low Dose CT Chest recommended if Age 62-80 years, 30 pack-year currently smoking OR have quit w/in 15years. Patient does not qualify. Breast:  Up to date on  Mammogram? Yes   Up to date of Bone Density/Dexa? Due, ordered Colorectal: up to date  Additional Screenings:  Hepatitis C  Screening: declined      Plan:    I have personally reviewed and addressed the Medicare Annual Wellness questionnaire and have noted the following in the patient's chart:  A. Medical and social history B. Use of alcohol, tobacco or illicit drugs  C. Current medications and supplements D. Functional ability and status E.  Nutritional status F.  Physical activity G. Advance directives H. List of other physicians I.  Hospitalizations, surgeries, and ER visits in previous 12 months J.  [place] to include hearing, vision, cognitive, depression L. Referrals and appointments - none  In addition, I am unable to review and discuss with incapacitated patient certain preventive protocols, quality metrics, and best practice recommendations. A written personalized care plan for preventive services as well as general preventive health recommendations were provided to patient.   See attached scanned questionnaire for additional information.   Signed,   Tyson Dense, RN Nurse Health Advisor  Patient Concerns: None

## 2017-12-24 NOTE — Patient Instructions (Signed)
Melissa Carroll , Thank you for taking time to come for your Medicare Wellness Visit. I appreciate your ongoing commitment to your health goals. Please review the following plan we discussed and let me know if I can assist you in the future.   Screening recommendations/referrals: Colonoscopy excluded, over age 82 Mammogram excluded, over age 49 Bone Density due, ordered Recommended yearly ophthalmology/optometry visit for glaucoma screening and checkup Recommended yearly dental visit for hygiene and checkup  Vaccinations: Influenza vaccine up to date, due 2019 fall season Pneumococcal vaccine up to date, completed Tdap vaccine up to date, due 12/17/2026 Shingles vaccine not in past records    Advanced directives: in chart  Conditions/risks identified: none  Next appointment: Dr. Bubba Camp makes rounds   Preventive Care 82 Years and Older, Female Preventive care refers to lifestyle choices and visits with your health care provider that can promote health and wellness. What does preventive care include?  A yearly physical exam. This is also called an annual well check.  Dental exams once or twice a year.  Routine eye exams. Ask your health care provider how often you should have your eyes checked.  Personal lifestyle choices, including:  Daily care of your teeth and gums.  Regular physical activity.  Eating a healthy diet.  Avoiding tobacco and drug use.  Limiting alcohol use.  Practicing safe sex.  Taking low-dose aspirin every day.  Taking vitamin and mineral supplements as recommended by your health care provider. What happens during an annual well check? The services and screenings done by your health care provider during your annual well check will depend on your age, overall health, lifestyle risk factors, and family history of disease. Counseling  Your health care provider may ask you questions about your:  Alcohol use.  Tobacco use.  Drug use.  Emotional  well-being.  Home and relationship well-being.  Sexual activity.  Eating habits.  History of falls.  Memory and ability to understand (cognition).  Work and work Statistician.  Reproductive health. Screening  You may have the following tests or measurements:  Height, weight, and BMI.  Blood pressure.  Lipid and cholesterol levels. These may be checked every 5 years, or more frequently if you are over 82 years old.  Skin check.  Lung cancer screening. You may have this screening every year starting at age 21 if you have a 30-pack-year history of smoking and currently smoke or have quit within the past 15 years.  Fecal occult blood test (FOBT) of the stool. You may have this test every year starting at age 31.  Flexible sigmoidoscopy or colonoscopy. You may have a sigmoidoscopy every 5 years or a colonoscopy every 10 years starting at age 50.  Hepatitis C blood test.  Hepatitis B blood test.  Sexually transmitted disease (STD) testing.  Diabetes screening. This is done by checking your blood sugar (glucose) after you have not eaten for a while (fasting). You may have this done every 1-3 years.  Bone density scan. This is done to screen for osteoporosis. You may have this done starting at age 82.  Mammogram. This may be done every 1-2 years. Talk to your health care provider about how often you should have regular mammograms. Talk with your health care provider about your test results, treatment options, and if necessary, the need for more tests. Vaccines  Your health care provider may recommend certain vaccines, such as:  Influenza vaccine. This is recommended every year.  Tetanus, diphtheria, and acellular pertussis (Tdap, Td)  vaccine. You may need a Td booster every 10 years.  Zoster vaccine. You may need this after age 42.  Pneumococcal 13-valent conjugate (PCV13) vaccine. One dose is recommended after age 4.  Pneumococcal polysaccharide (PPSV23) vaccine. One  dose is recommended after age 2. Talk to your health care provider about which screenings and vaccines you need and how often you need them. This information is not intended to replace advice given to you by your health care provider. Make sure you discuss any questions you have with your health care provider. Document Released: 05/26/2015 Document Revised: 01/17/2016 Document Reviewed: 02/28/2015 Elsevier Interactive Patient Education  2017 [place] Prevention in the Home Falls can cause injuries. They can happen to people of all ages. There are many things you can do to make your home safe and to help prevent falls. What can I do on the outside of my home?  Regularly fix the edges of walkways and driveways and fix any cracks.  Remove anything that might make you trip as you walk through a door, such as a raised step or threshold.  Trim any bushes or trees on the path to your home.  Use bright outdoor lighting.  Clear any walking paths of anything that might make someone trip, such as rocks or tools.  Regularly check to see if handrails are loose or broken. Make sure that both sides of any steps have handrails.  Any raised decks and porches should have guardrails on the edges.  Have any leaves, snow, or ice cleared regularly.  Use sand or salt on walking paths during winter.  Clean up any spills in your garage right away. This includes oil or grease spills. What can I do in the bathroom?  Use night lights.  Install grab bars by the toilet and in the tub and shower. Do not use towel bars as grab bars.  Use non-skid mats or decals in the tub or shower.  If you need to sit down in the shower, use a plastic, non-slip stool.  Keep the floor dry. Clean up any water that spills on the floor as soon as it happens.  Remove soap buildup in the tub or shower regularly.  Attach bath mats securely with double-sided non-slip rug tape.  Do not have throw rugs and other  things on the floor that can make you trip. What can I do in the bedroom?  Use night lights.  Make sure that you have a light by your bed that is easy to reach.  Do not use any sheets or blankets that are too big for your bed. They should not hang down onto the floor.  Have a firm chair that has side arms. You can use this for support while you get dressed.  Do not have throw rugs and other things on the floor that can make you trip. What can I do in the kitchen?  Clean up any spills right away.  Avoid walking on wet floors.  Keep items that you use a lot in easy-to-reach places.  If you need to reach something above you, use a strong step stool that has a grab bar.  Keep electrical cords out of the way.  Do not use floor polish or wax that makes floors slippery. If you must use wax, use non-skid floor wax.  Do not have throw rugs and other things on the floor that can make you trip. What can I do with my stairs?  Do not leave  any items on the stairs.  Make sure that there are handrails on both sides of the stairs and use them. Fix handrails that are broken or loose. Make sure that handrails are as long as the stairways.  Check any carpeting to make sure that it is firmly attached to the stairs. Fix any carpet that is loose or worn.  Avoid having throw rugs at the top or bottom of the stairs. If you do have throw rugs, attach them to the floor with carpet tape.  Make sure that you have a light switch at the top of the stairs and the bottom of the stairs. If you do not have them, ask someone to add them for you. What else can I do to help prevent falls?  Wear shoes that:  Do not have high heels.  Have rubber bottoms.  Are comfortable and fit you well.  Are closed at the toe. Do not wear sandals.  If you use a stepladder:  Make sure that it is fully opened. Do not climb a closed stepladder.  Make sure that both sides of the stepladder are locked into place.  Ask  someone to hold it for you, if possible.  Clearly mark and make sure that you can see:  Any grab bars or handrails.  First and last steps.  Where the edge of each step is.  Use tools that help you move around (mobility aids) if they are needed. These include:  Canes.  Walkers.  Scooters.  Crutches.  Turn on the lights when you go into a dark area. Replace any light bulbs as soon as they burn out.  Set up your furniture so you have a clear path. Avoid moving your furniture around.  If any of your floors are uneven, fix them.  If there are any pets around you, be aware of where they are.  Review your medicines with your doctor. Some medicines can make you feel dizzy. This can increase your chance of falling. Ask your doctor what other things that you can do to help prevent falls. This information is not intended to replace advice given to you by your health care provider. Make sure you discuss any questions you have with your health care provider. Document Released: 02/23/2009 Document Revised: 10/05/2015 Document Reviewed: 06/03/2014 Elsevier Interactive Patient Education  2017 [employer].

## 2018-01-06 ENCOUNTER — Encounter: Payer: Self-pay | Admitting: Family

## 2018-01-06 ENCOUNTER — Non-Acute Institutional Stay (SKILLED_NURSING_FACILITY): Payer: Medicare Other | Admitting: Family

## 2018-01-06 DIAGNOSIS — F0151 Vascular dementia with behavioral disturbance: Secondary | ICD-10-CM | POA: Diagnosis not present

## 2018-01-06 DIAGNOSIS — T148XXA Other injury of unspecified body region, initial encounter: Secondary | ICD-10-CM

## 2018-01-06 DIAGNOSIS — F01518 Vascular dementia, unspecified severity, with other behavioral disturbance: Secondary | ICD-10-CM

## 2018-01-06 DIAGNOSIS — K5901 Slow transit constipation: Secondary | ICD-10-CM

## 2018-01-06 MED ORDER — POLYETHYLENE GLYCOL 3350 17 GM/SCOOP PO POWD: 17.0000 g | Freq: Every day | ORAL | 1 refills | Status: DC

## 2018-01-06 NOTE — Progress Notes (Signed)
Location:  [facility] Room Number: 2 Place of Service:  SNF (31) Provider: Aj Crunkleton FNP-C  Blanchie Serve, MD  Patient Care Team: Blanchie Serve, MD as PCP - General (Internal Medicine) Mast, Man X, NP as Nurse Practitioner (Nurse Practitioner) Charlotte Crumb, MD as Consulting Physician (Orthopedic Surgery) Wallene Huh, DPM as Consulting Physician (Podiatry) Gaynelle Arabian, MD as Consulting Physician (Orthopedic Surgery) Zebedee Iba., MD as Referring Physician (Ophthalmology) Newman Pies, MD as Consulting Physician (Neurosurgery) Melina Modena, [facility] (Skilled Nursing and [place])  Extended Emergency Contact Information Primary Emergency Contact: [place] of [place] Phone: [phone] Mobile Phone: [phone] Relation: Daughter Secondary Emergency Contact: [place] of [place] Phone: [phone] Relation: Grandson  Code Status:  DNR Goals of care: Advanced Directive information Advanced Directives 01/06/2018  Does Patient Have a Medical Advance Directive? Yes  Type of Paramedic of [place];Out of facility DNR (pink MOST or yellow form);Living will  Does patient want to make changes to medical advance directive? -  Copy of [place] in Chart? Yes  Pre-existing out of facility DNR order (yellow form or pink MOST form) Yellow form placed in chart (order not valid for inpatient use);Pink MOST form placed in chart (order not valid for inpatient use)     Chief Complaint  Patient presents with  . Acute Visit    bruising to both arms and hands    HPI:  Pt is a 84 y.o. female seen today at [facility] for an acute visit for evaluation of bruises on arms and hands.she is seen in her room today with facility Nurse present at bedside.Nurse reports via SBAR patient has bilateral arm/hands bruise.Patient was noted by nurse on  01/05/2018 digging her stool and screaming due to constipation.Suppository was given.Patient was striking staff while being cleaned up required  X 3 staff to assist with cleaning.Patient also attempted to hit staff with her walker early this morning and refused to put on her incontinent brief.since then she has been cooperative per nurse. No fever,chills or cough reported.  Past Medical History:  Diagnosis Date  . Arthritis   . Cataracts, bilateral   . Closed fracture of unspecified part of lower end of humerus   . Dementia   . Hypertension   . Insomnia, unspecified   . Macular degeneration (senile) of retina, unspecified   . Osteoarthritis   . Osteoarthrosis, unspecified whether generalized or localized, unspecified site   . Pancreatitis, acute 08/14/2013  . Retinal neovascularization NOS   . Senile osteoporosis   . Subarachnoid hemorrhage following injury ([place]) 03/31/2012  . Unspecified hearing loss   . Unspecified hypothyroidism   . Urinary tract infection 08/10/2013  . Vertebral fracture 08/09/2013   T7 10%, T12 70%    Past Surgical History:  Procedure Laterality Date  . ABDOMINAL HYSTERECTOMY  1942  . ANKLE FRACTURE SURGERY  1960  . BLADDER SUSPENSION    . CLOSED REDUCTION WITH HUMERAL PIN INSERTION  04/01/2012   Procedure: CLOSED REDUCTION WITH HUMERAL PIN INSERTION;  Surgeon: Schuyler Amor, MD;  Location: [facility] ORS;  Service: Orthopedics;  Laterality: Left;    No Known Allergies  Outpatient Encounter Medications as of 01/06/2018  Medication Sig  . acetaminophen (TYLENOL) 500 MG tablet Take 500 mg by mouth every 6 (six) hours as needed. Pain  . amLODipine (NORVASC) 10 MG tablet Take 10 mg by mouth daily.  Marland Kitchen aspirin EC 81 MG  tablet Take 81 mg by mouth daily.  . Cholecalciferol 1000 units capsule Take 2,000 Units by mouth daily.   Marland Kitchen ketoconazole (NIZORAL) 2 % shampoo Apply 1 application topically once a week. On Wednesday  . levothyroxine (SYNTHROID, LEVOTHROID) 75 MCG tablet  Take 75 mcg by mouth daily before breakfast.  . LORazepam (ATIVAN) 1 MG tablet Take 1 mg by mouth as needed for anxiety (Give 1 hour prior to dental appointment).  . losartan (COZAAR) 100 MG tablet Take 1 tablet (100 mg total) by mouth daily.  . metoprolol (LOPRESSOR) 100 MG tablet Take 100 mg by mouth 2 (two) times daily.   . Nutritional Supplements (BOOST GLUCOSE CONTROL) LIQD Take 237 mLs by mouth 2 (two) times daily.  . QUEtiapine (SEROQUEL) 25 MG tablet Take 0.5 tablets (12.5 mg total) by mouth at bedtime.  . raloxifene (EVISTA) 60 MG tablet Take 60 mg by mouth daily.   No facility-administered encounter medications on file as of 01/06/2018.     Review of Systems  Unable to perform ROS: Dementia (additional information provided by facility nurse )    Immunization History  Administered Date(s) Administered  . Influenza-Unspecified 03/02/2013, 02/15/2014, 02/16/2015, 02/22/2016, 02/20/2017  . PPD Test 03/23/2012  . Pneumococcal Conjugate-13 12/17/2016  . Pneumococcal Polysaccharide-23 09/22/2002  . Td 11/17/2002  . Tdap 12/16/2016   Pertinent  Health Maintenance Due  Topic Date Due  . INFLUENZA VACCINE  12/11/2017  . DEXA SCAN  01/17/2018 (Originally [birth year]/1983)  . PNA vac Low Risk Adult  Completed   Fall Risk  12/24/2017 12/13/2016 02/07/2015 02/22/2014 02/22/2014  Falls in the past year? Yes No No Yes No  Number falls in past yr: 1 - - 1 -  Injury with Fall? Yes - - - -  Risk for fall due to : - - - History of fall(s) -   Functional Status Survey: Is the patient deaf or have difficulty hearing?: Yes Does the patient have difficulty seeing, even when wearing glasses/contacts?: Yes Does the patient have difficulty concentrating, remembering, or making decisions?: Yes Does the patient have difficulty walking or climbing stairs?: Yes Does the patient have difficulty dressing or bathing?: Yes Does the patient have difficulty doing errands alone such as visiting a doctor's office  or shopping?: Yes  Vitals:   01/06/18 1035  BP: (!) 180/77  Pulse: 68  Resp: 18  Temp: (!) 97.4 F (36.3 C)  SpO2: 95%  Weight: 130 lb 6.4 oz (59.1 kg)  Height: 5\' 1"  (1.549 m)   Body mass index is 24.64 kg/m. Physical Exam  Constitutional: She appears well-developed.  Frail elderly in no acute distress   Cardiovascular: Normal rate, regular rhythm, normal heart sounds and intact distal pulses. Exam reveals no gallop and no friction rub.  No murmur heard. Pulmonary/Chest: Effort normal and breath sounds normal. No respiratory distress. She has no wheezes. She has no rales.  Abdominal: Soft. Bowel sounds are normal. She exhibits no distension and no mass. There is no tenderness. There is no rebound and no guarding.  Musculoskeletal: She exhibits no edema or tenderness.  Moves x 4 extremities unsteady gait ambulates with walker.  Neurological: Gait abnormal.  HOH Pleasantly confused at her baseline   Skin: Skin is warm and dry. No rash noted. No pallor.  Bilateral forearm wrist area purple bruise without any signs of infection.   Psychiatric: She has a normal mood and affect. Her speech is normal. Thought content normal. She is agitated. Cognition and memory are impaired.  Labs reviewed: Recent Labs    01/23/17 03/27/17 09/01/17  NA 136* 139 135*  K 4.0 4.1 4.5  CL  --   --  100  CO2  --   --  27  BUN 18 19 24*  CREATININE 0.7 0.6 0.7  CALCIUM  --   --  8.8   Recent Labs    09/01/17  AST 14  ALT 11  ALKPHOS 54  PROT 6.0  ALBUMIN 3.1   Recent Labs    03/27/17 09/01/17  WBC 5.8 9.6  HGB 13.2 12.3  HCT 38 35*  PLT 180 243   Lab Results  Component Value Date   TSH 1.97 09/01/2017   Lab Results  Component Value Date   HGBA1C 6.7 07/18/2016   Lab Results  Component Value Date   CHOL 146 01/23/2017   HDL 39 01/23/2017   LDLCALC 76 01/23/2017   TRIG 214 (A) 01/23/2017    Significant Diagnostic Results in last 30 days:  No results  found.  Assessment/Plan 1. Bruise Bilateral forearm wrist area purple bruise unclear etiology but suspect sustained when striking staff while being assisted to clean up after having bowel movement.Patient easily bruises due to Asprin.continue to monitor for signs or symptoms of infections.    2. Slow transit constipation Hard stool reported on 01/05/2018.Start on Miralax 17 Gm powder mix in juice/water and drink by mouth once daily.Hold for loose stool.Encourage oral intake and hydration.   3. Vascular dementia with behavior disturbance Resisting care and combative towards Nursing staff.will increase Seroquel from 12.5 mg tablet to 25 mg tablet by mouth daily at bedtime.continue supportive care.   Family/ staff Communication: Reviewed plan of care with patient and facility Nurse.   Labs/tests ordered: None   Nuha Degner C Brahm Barbeau, NP

## 2018-01-15 ENCOUNTER — Non-Acute Institutional Stay (SKILLED_NURSING_FACILITY): Payer: Medicare Other | Admitting: Internal Medicine

## 2018-01-15 ENCOUNTER — Encounter: Payer: Self-pay | Admitting: Internal Medicine

## 2018-01-15 DIAGNOSIS — I10 Essential (primary) hypertension: Secondary | ICD-10-CM | POA: Diagnosis not present

## 2018-01-15 DIAGNOSIS — K5901 Slow transit constipation: Secondary | ICD-10-CM | POA: Diagnosis not present

## 2018-01-15 DIAGNOSIS — F0151 Vascular dementia with behavioral disturbance: Secondary | ICD-10-CM | POA: Diagnosis not present

## 2018-01-15 DIAGNOSIS — E039 Hypothyroidism, unspecified: Secondary | ICD-10-CM | POA: Diagnosis not present

## 2018-01-15 DIAGNOSIS — M8000XS Age-related osteoporosis with current pathological fracture, unspecified site, sequela: Secondary | ICD-10-CM | POA: Diagnosis not present

## 2018-01-15 DIAGNOSIS — F01518 Vascular dementia, unspecified severity, with other behavioral disturbance: Secondary | ICD-10-CM

## 2018-01-15 DIAGNOSIS — H903 Sensorineural hearing loss, bilateral: Secondary | ICD-10-CM | POA: Diagnosis not present

## 2018-01-15 NOTE — Progress Notes (Signed)
Location:  [facility] Room Number: 2 Place of Service:  SNF (31) Provider:  Blanchie Serve MD  Blanchie Serve, MD  Patient Care Team: Blanchie Serve, MD as PCP - General (Internal Medicine) Mast, Man X, NP as Nurse Practitioner (Nurse Practitioner) Charlotte Crumb, MD as Consulting Physician (Orthopedic Surgery) Wallene Huh, DPM as Consulting Physician (Podiatry) Gaynelle Arabian, MD as Consulting Physician (Orthopedic Surgery) Zebedee Iba., MD as Referring Physician (Ophthalmology) Newman Pies, MD as Consulting Physician (Neurosurgery) Melina Modena, [facility] (Skilled Nursing and [place])  Extended Emergency Contact Information Primary Emergency Contact: [place] of [place] Phone: [phone] Mobile Phone: [phone] Relation: Daughter Secondary Emergency Contact: [place] of [place] Phone: [phone] Relation: Grandson  Code Status:  DNR  Goals of care: Advanced Directive information Advanced Directives 01/15/2018  Does Patient Have a Medical Advance Directive? Yes  Type of Paramedic of [place];Out of facility DNR (pink MOST or yellow form);Living will  Does patient want to make changes to medical advance directive? -  Copy of [place] in Chart? Yes  Pre-existing out of facility DNR order (yellow form or pink MOST form) Yellow form placed in chart (order not valid for inpatient use);Pink MOST form placed in chart (order not valid for inpatient use)     Chief Complaint  Patient presents with  . Medical Management of Chronic Issues    monthly routine    HPI:  Pt is a 84 y.o. female seen today for medical management of chronic diseases. She is pleasantly confused and denies any concern. No acute concern from nursing. Behavior has been stable for most days by nursing.   Hypothyroidism- taking levothyroxine 75 mcg  daily  Osteoporosis- on raloxifene daily, no fall reported, ambulates with walker and needs some assistance with transfer. Takes vitamin d supplement.   Dementia with behavioral disturbance- overall stable behavior per nursing. Taking seroquel 25 mg qhs  Chronic constipation- daily miralax has been helpful, no loose stool reported.   Hypertension- Taking metoprolol 100 mg bid, amlodipine 10 mg daily and losartan 100 mg daily. Has history of CVA. Has refused for her blood pressure to be checked.    Past Medical History:  Diagnosis Date  . Arthritis   . Cataracts, bilateral   . Closed fracture of unspecified part of lower end of humerus   . Dementia   . Hypertension   . Insomnia, unspecified   . Macular degeneration (senile) of retina, unspecified   . Osteoarthritis   . Osteoarthrosis, unspecified whether generalized or localized, unspecified site   . Pancreatitis, acute 08/14/2013  . Retinal neovascularization NOS   . Senile osteoporosis   . Subarachnoid hemorrhage following injury ([place]) 03/31/2012  . Unspecified hearing loss   . Unspecified hypothyroidism   . Urinary tract infection 08/10/2013  . Vertebral fracture 08/09/2013   T7 10%, T12 70%    Past Surgical History:  Procedure Laterality Date  . ABDOMINAL HYSTERECTOMY  1942  . ANKLE FRACTURE SURGERY  1960  . BLADDER SUSPENSION    . CLOSED REDUCTION WITH HUMERAL PIN INSERTION  04/01/2012   Procedure: CLOSED REDUCTION WITH HUMERAL PIN INSERTION;  Surgeon: Schuyler Amor, MD;  Location: [facility];  Service: Orthopedics;  Laterality: Left;    No Known Allergies  Outpatient Encounter Medications as of 01/15/2018  Medication Sig  . acetaminophen (TYLENOL) 500 MG tablet Take 500 mg by mouth every 6 (six) hours as needed. Pain  .  amLODipine (NORVASC) 10 MG tablet Take 10 mg by mouth daily.  Marland Kitchen aspirin EC 81 MG tablet Take 81 mg by mouth daily.  . Cholecalciferol 1000 units capsule Take 2,000 Units by mouth daily.   Marland Kitchen  ketoconazole (NIZORAL) 2 % shampoo Apply 1 application topically once a week. On Wednesday  . levothyroxine (SYNTHROID, LEVOTHROID) 75 MCG tablet Take 75 mcg by mouth daily before breakfast.  . LORazepam (ATIVAN) 1 MG tablet Take 1 mg by mouth as needed for anxiety (Give 1 hour prior to dental appointment).  . losartan (COZAAR) 100 MG tablet Take 1 tablet (100 mg total) by mouth daily.  . metoprolol (LOPRESSOR) 100 MG tablet Take 100 mg by mouth 2 (two) times daily.   . Nutritional Supplements (BOOST GLUCOSE CONTROL) LIQD Take 237 mLs by mouth 2 (two) times daily.  . polyethylene glycol powder (GLYCOLAX/MIRALAX) powder Take 17 g by mouth daily. Hold for loose stool  . QUEtiapine (SEROQUEL) 25 MG tablet Take 25 mg by mouth at bedtime.  . raloxifene (EVISTA) 60 MG tablet Take 60 mg by mouth daily.  . [DISCONTINUED] QUEtiapine (SEROQUEL) 25 MG tablet Take 0.5 tablets (12.5 mg total) by mouth at bedtime.   No facility-administered encounter medications on file as of 01/15/2018.     Review of Systems  Unable to perform ROS: Dementia  Constitutional: Negative for appetite change, chills and fever.       Feeds herself  HENT: Positive for hearing loss. Negative for congestion, ear pain, mouth sores, postnasal drip and trouble swallowing.   Eyes: Positive for visual disturbance.  Respiratory: Negative for cough and shortness of breath.   Cardiovascular: Negative for chest pain and palpitations.  Gastrointestinal: Positive for constipation. Negative for abdominal pain, diarrhea, nausea and vomiting.  Genitourinary: Negative for dysuria.  Musculoskeletal: Positive for arthralgias and gait problem.  Skin: Negative for wound.  Neurological: Negative for dizziness, weakness and headaches.  Hematological: Bruises/bleeds easily.  Psychiatric/Behavioral: Positive for behavioral problems and confusion. Negative for hallucinations.    Immunization History  Administered Date(s) Administered  .  Influenza-Unspecified 03/02/2013, 02/15/2014, 02/16/2015, 02/22/2016, 02/20/2017  . PPD Test 03/23/2012  . Pneumococcal Conjugate-13 12/17/2016  . Pneumococcal Polysaccharide-23 09/22/2002  . Td 11/17/2002  . Tdap 12/16/2016   Pertinent  Health Maintenance Due  Topic Date Due  . INFLUENZA VACCINE  12/11/2017  . DEXA SCAN  01/17/2018 (Originally 12/29/1981)  . PNA vac Low Risk Adult  Completed   Fall Risk  12/24/2017 12/13/2016 02/07/2015 02/22/2014 02/22/2014  Falls in the past year? Yes No No Yes No  Number falls in past yr: 1 - - 1 -  Injury with Fall? Yes - - - -  Risk for fall due to : - - - History of fall(s) -   Functional Status Survey:    Vitals:   01/15/18 1045  Pulse: 96  Resp: 18  Temp: (!) 97.4 F (36.3 C)  SpO2: 95%  Weight: 133 lb (60.3 kg)  Height: 5\' 1"  (1.549 m)   Body mass index is 25.13 kg/m.   Wt Readings from Last 3 Encounters:  01/15/18 133 lb (60.3 kg)  01/06/18 130 lb 6.4 oz (59.1 kg)  12/24/17 128 lb (58.1 kg)   Physical Exam  Constitutional: She appears well-developed and well-nourished. No distress.  HENT:  Head: Normocephalic and atraumatic.  Mouth/Throat: Oropharynx is clear and moist.  Eyes: Conjunctivae and EOM are normal. Right eye exhibits no discharge. Left eye exhibits no discharge.  Has corrective glasses  Neck:  Normal range of motion. Neck supple.  Cardiovascular: Normal rate and regular rhythm.  Pulmonary/Chest: Effort normal. She has no wheezes. She has no rales. She exhibits no tenderness.  Poor air movement, has kyphosis and scoliosis  Abdominal: Soft. Bowel sounds are normal. There is no tenderness. There is no guarding.  Musculoskeletal:  Trace leg edema left > right, can move all 4 extremities, unsteady gait, uses walker  Lymphadenopathy:    She has no cervical adenopathy.  Neurological: She is alert.  Oriented only to self  Skin: Skin is warm and dry. She is not diaphoretic.  Psychiatric:  Pleasantly confused     Labs reviewed: Recent Labs    01/23/17 03/27/17 09/01/17  NA 136* 139 135*  K 4.0 4.1 4.5  CL  --   --  100  CO2  --   --  27  BUN 18 19 24*  CREATININE 0.7 0.6 0.7  CALCIUM  --   --  8.8   Recent Labs    09/01/17  AST 14  ALT 11  ALKPHOS 54  PROT 6.0  ALBUMIN 3.1   Recent Labs    03/27/17 09/01/17  WBC 5.8 9.6  HGB 13.2 12.3  HCT 38 35*  PLT 180 243   Lab Results  Component Value Date   TSH 1.97 09/01/2017   Lab Results  Component Value Date   HGBA1C 6.7 07/18/2016   Lab Results  Component Value Date   CHOL 146 01/23/2017   HDL 39 01/23/2017   LDLCALC 76 01/23/2017   TRIG 214 (A) 01/23/2017    Significant Diagnostic Results in last 30 days:  No results found.  Assessment/Plan  1. Essential hypertension Continue aspirin with metoprolol, losartan and amlodipine for now, check cmp, refusing bp check, if has a fall or complaints of dizziness will need to assess need for 3 antihypertensive.   2. Acquired hypothyroidism Lab Results  Component Value Date   TSH 1.97 09/01/2017   Check thyroid function, continue levothyroxine  3. Sensorineural hearing loss (SNHL) of both ears Supportive care  4. Vascular dementia with behavior disturbance Supportive care, continue seroquel, continue antihypertensive, refusing bp check  5. Osteoporosis with pathological fracture, sequela Fall prevention, continue raloxifene and vit d supplement.   6. Slow transit constipation Continue daily miralax, maintain hydration   Family/ staff Communication: reviewed care plan with patient and charge nurse.    Labs/tests ordered:  TSH, CMP   Blanchie Serve, MD Internal Medicine [facility] [address] [place], [place] [address] Cell Phone (Monday-Friday 8 am - 5 pm): [phone] On Call: [phone] and follow prompts after 5 pm and on weekends Office Phone: [phone] Office Fax: [phone]

## 2018-01-19 DIAGNOSIS — I10 Essential (primary) hypertension: Secondary | ICD-10-CM | POA: Diagnosis not present

## 2018-01-19 DIAGNOSIS — E039 Hypothyroidism, unspecified: Secondary | ICD-10-CM | POA: Diagnosis not present

## 2018-01-19 LAB — COMPLETE METABOLIC PANEL WITH GFR
ALBUMIN: 3
ALT: 10
AST: 11
Alkaline Phosphatase: 37
BUN: 24 — AB (ref 4–21)
CREATININE: 0.66
Calcium: 8.8
EGFR (Non-African Amer.): 80
GLUCOSE: 103
POTASSIUM: 4.1
SODIUM: 139
Total Bilirubin: 0.6
Total Protein: 5.5 g/dL

## 2018-01-19 LAB — TSH: TSH: 5.58

## 2018-01-20 ENCOUNTER — Encounter: Payer: Self-pay | Admitting: *Deleted

## 2018-01-20 ENCOUNTER — Non-Acute Institutional Stay (SKILLED_NURSING_FACILITY): Payer: Medicare Other | Admitting: Family

## 2018-01-20 ENCOUNTER — Encounter: Payer: Self-pay | Admitting: Internal Medicine

## 2018-01-20 ENCOUNTER — Encounter: Payer: Self-pay | Admitting: Family

## 2018-01-20 DIAGNOSIS — E039 Hypothyroidism, unspecified: Secondary | ICD-10-CM

## 2018-01-20 NOTE — Progress Notes (Signed)
Location:  [facility] Room Number: 2 Place of Service:  SNF (31) Provider: Patton Swisher FNP-C  Blanchie Serve, MD  Patient Care Team: Blanchie Serve, MD as PCP - General (Internal Medicine) Mast, Man X, NP as Nurse Practitioner (Nurse Practitioner) Charlotte Crumb, MD as Consulting Physician (Orthopedic Surgery) Wallene Huh, DPM as Consulting Physician (Podiatry) Gaynelle Arabian, MD as Consulting Physician (Orthopedic Surgery) Zebedee Iba., MD as Referring Physician (Ophthalmology) Newman Pies, MD as Consulting Physician (Neurosurgery) Melina Modena, [facility] (Skilled Nursing and [place])  Extended Emergency Contact Information Primary Emergency Contact: [place] of [place] Phone: [phone] Mobile Phone: [phone] Relation: Daughter Secondary Emergency Contact: [place] of [place] Phone: [phone] Relation: Grandson  Code Status:  DNR Goals of care: Advanced Directive information Advanced Directives 01/20/2018  Does Patient Have a Medical Advance Directive? Yes  Type of Paramedic of [place];Out of facility DNR (pink MOST or yellow form);Living will  Does patient want to make changes to medical advance directive? -  Copy of [place] in Chart? Yes  Pre-existing out of facility DNR order (yellow form or pink MOST form) Yellow form placed in chart (order not valid for inpatient use);Pink MOST form placed in chart (order not valid for inpatient use)     Chief Complaint  Patient presents with  . Acute Visit    abnormal labs    HPI:  Pt is a 84 y.o. female seen today at [facility] for an acute visit for evaluation of abnormal lab results.she is seen in her room today with facility Nurse present at bedside.she denies any acute issues during visit though HPI limited due to her hearing and cognitive impairment.Her recent TSH  level was 5.58 ( 01/19/2018).she is currently on Levothyroxine 75 mcg tablet daily before breakfast.Nurse states patient sometimes refuses to take her medications.   Past Medical History:  Diagnosis Date  . Arthritis   . Cataracts, bilateral   . Closed fracture of unspecified part of lower end of humerus   . Dementia   . Hypertension   . Insomnia, unspecified   . Macular degeneration (senile) of retina, unspecified   . Osteoarthritis   . Osteoarthrosis, unspecified whether generalized or localized, unspecified site   . Pancreatitis, acute 08/14/2013  . Retinal neovascularization NOS   . Senile osteoporosis   . Subarachnoid hemorrhage following injury ([place]) 03/31/2012  . Unspecified hearing loss   . Unspecified hypothyroidism   . Urinary tract infection 08/10/2013  . Vertebral fracture 08/09/2013   T7 10%, T12 70%    Past Surgical History:  Procedure Laterality Date  . ABDOMINAL HYSTERECTOMY  1942  . ANKLE FRACTURE SURGERY  1960  . BLADDER SUSPENSION    . CLOSED REDUCTION WITH HUMERAL PIN INSERTION  04/01/2012   Procedure: CLOSED REDUCTION WITH HUMERAL PIN INSERTION;  Surgeon: Schuyler Amor, MD;  Location: [facility] ORS;  Service: Orthopedics;  Laterality: Left;    No Known Allergies  Outpatient Encounter Medications as of 01/20/2018  Medication Sig  . acetaminophen (TYLENOL) 500 MG tablet Take 500 mg by mouth every 6 (six) hours as needed. Pain  . amLODipine (NORVASC) 10 MG tablet Take 10 mg by mouth daily.  Marland Kitchen aspirin EC 81 MG tablet Take 81 mg by mouth daily.  . Cholecalciferol 1000 units capsule Take 2,000 Units by mouth daily.   Marland Kitchen ketoconazole (NIZORAL) 2 % shampoo Apply 1 application topically once a week. On  Wednesday  . levothyroxine (SYNTHROID, LEVOTHROID) 75 MCG tablet Take 75 mcg by mouth daily before breakfast.  . LORazepam (ATIVAN) 1 MG tablet Take 1 mg by mouth as needed for anxiety (Give 1 hour prior to dental appointment).  . losartan (COZAAR) 100 MG tablet Take 1  tablet (100 mg total) by mouth daily.  . metoprolol (LOPRESSOR) 100 MG tablet Take 100 mg by mouth 2 (two) times daily.   . Nutritional Supplements (BOOST GLUCOSE CONTROL) LIQD Take 237 mLs by mouth 2 (two) times daily.  . polyethylene glycol powder (GLYCOLAX/MIRALAX) powder Take 17 g by mouth daily. Hold for loose stool  . QUEtiapine (SEROQUEL) 25 MG tablet Take 25 mg by mouth at bedtime.  . raloxifene (EVISTA) 60 MG tablet Take 60 mg by mouth daily.   No facility-administered encounter medications on file as of 01/20/2018.     Review of Systems  Unable to perform ROS: Dementia (additional information provided by facility Nurse )    Immunization History  Administered Date(s) Administered  . Influenza-Unspecified 03/02/2013, 02/15/2014, 02/16/2015, 02/22/2016, 02/20/2017  . PPD Test 03/23/2012  . Pneumococcal Conjugate-13 12/17/2016  . Pneumococcal Polysaccharide-23 09/22/2002  . Td 11/17/2002  . Tdap 12/16/2016   Pertinent  Health Maintenance Due  Topic Date Due  . DEXA SCAN  12/29/1983  . INFLUENZA VACCINE  12/11/2017  . PNA vac Low Risk Adult  Completed   Fall Risk  12/24/2017 12/13/2016 02/07/2015 02/22/2014 02/22/2014  Falls in the past year? Yes No No Yes No  Number falls in past yr: 1 - - 1 -  Injury with Fall? Yes - - - -  Risk for fall due to : - - - History of fall(s) -    Vitals:   01/20/18 1345  BP: (!) 180/77  Pulse: 76  Resp: 18  Temp: (!) 97.4 F (36.3 C)  SpO2: 95%  Weight: 133 lb (60.3 kg)  Height: 5\' 1"  (1.549 m)   Body mass index is 25.13 kg/m. Physical Exam  Constitutional: She appears well-developed and well-nourished.  Elderly in no acute distress   HENT:  Head: Normocephalic.  Mouth/Throat: Oropharynx is clear and moist. No oropharyngeal exudate.  HOH   Eyes: Pupils are equal, round, and reactive to light. Conjunctivae are normal. Right eye exhibits no discharge. Left eye exhibits no discharge. No scleral icterus.  Neck: Normal range of  motion. No JVD present. No thyromegaly present.  Cardiovascular: Normal rate, regular rhythm, normal heart sounds and intact distal pulses. Exam reveals no gallop and no friction rub.  No murmur heard. Pulmonary/Chest: Effort normal and breath sounds normal. No respiratory distress. She has no wheezes. She has no rales.  Abdominal: Soft. Bowel sounds are normal. She exhibits no distension and no mass. There is no tenderness. There is no rebound and no guarding.  Lymphadenopathy:    She has no cervical adenopathy.  Neurological:  Pleasantly confused at her baseline   Skin: Skin is warm and dry. No rash noted. No erythema. No pallor.  Psychiatric: She has a normal mood and affect. Her speech is normal and behavior is normal. Judgment and thought content normal. Cognition and memory are impaired.  Nursing note and vitals reviewed.   Labs reviewed: Recent Labs    03/27/17 09/01/17 01/19/18  NA 139 135* 139  K 4.1 4.5 4.1  CL  --  100  --   CO2  --  27  --   BUN 19 24* 24*  CREATININE 0.6 0.7 0.66  CALCIUM  --  8.8 8.8   Recent Labs    09/01/17 01/19/18  AST 14 11  ALT 11 10  ALKPHOS 54 37  BILITOT  --  0.6  PROT 6.0 5.5  ALBUMIN 3.1 3   Recent Labs    03/27/17 09/01/17  WBC 5.8 9.6  HGB 13.2 12.3  HCT 38 35*  PLT 180 243   Lab Results  Component Value Date   TSH 5.58 01/19/2018   Lab Results  Component Value Date   HGBA1C 6.7 07/18/2016   Lab Results  Component Value Date   CHOL 146 01/23/2017   HDL 39 01/23/2017   LDLCALC 76 01/23/2017   TRIG 214 (A) 01/23/2017    Significant Diagnostic Results in last 30 days:  No results found.  Assessment/Plan  Acquired hypothyroidism TSH level 5.58 ( 01/19/2018).Refuses medication at times.Increase levothyroxine to 88 mcg tablet daily.recheck TSH level in 8 weeks.  Family/ staff Communication: Reviewed plan of care with patient and facility Nurse.   Labs/tests ordered:  TSH level in 8 weeks.  Sandrea Hughs, NP

## 2018-02-11 ENCOUNTER — Encounter: Payer: Self-pay | Admitting: Family

## 2018-02-11 ENCOUNTER — Non-Acute Institutional Stay (SKILLED_NURSING_FACILITY): Payer: Medicare Other | Admitting: Family

## 2018-02-11 DIAGNOSIS — M810 Age-related osteoporosis without current pathological fracture: Secondary | ICD-10-CM

## 2018-02-11 DIAGNOSIS — I10 Essential (primary) hypertension: Secondary | ICD-10-CM

## 2018-02-11 DIAGNOSIS — E039 Hypothyroidism, unspecified: Secondary | ICD-10-CM

## 2018-02-11 DIAGNOSIS — F01518 Vascular dementia, unspecified severity, with other behavioral disturbance: Secondary | ICD-10-CM

## 2018-02-11 DIAGNOSIS — F0151 Vascular dementia with behavioral disturbance: Secondary | ICD-10-CM

## 2018-02-11 NOTE — Progress Notes (Signed)
Location:  [place] [facility] Room Number: 2 Place of Service:  SNF (31) Provider: Essex Perry FNP-C   Blanchie Serve, MD  Patient Care Team: Blanchie Serve, MD as PCP - General (Internal Medicine) Mast, Man X, NP as Nurse Practitioner (Nurse Practitioner) Charlotte Crumb, MD as Consulting Physician (Orthopedic Surgery) Wallene Huh, DPM as Consulting Physician (Podiatry) Gaynelle Arabian, MD as Consulting Physician (Orthopedic Surgery) Zebedee Iba., MD as Referring Physician (Ophthalmology) Newman Pies, MD as Consulting Physician (Neurosurgery) Melina Modena, [facility] (Skilled Nursing and [place])  Extended Emergency Contact Information Primary Emergency Contact: [place] of [place] Phone: [phone] Mobile Phone: [phone] Relation: Daughter Secondary Emergency Contact: [place] of [place] Phone: [phone] Relation: Grandson  Code Status:  DNR Goals of care: Advanced Directive information Advanced Directives 02/11/2018  Does Patient Have a Medical Advance Directive? Yes  Type of Paramedic of [place];Living will;Out of facility DNR (pink MOST or yellow form)  Does patient want to make changes to medical advance directive? No - Patient declined  Copy of [place] in Chart? Yes  Pre-existing out of facility DNR order (yellow form or pink MOST form) Yellow form placed in chart (order not valid for inpatient use);Pink MOST form placed in chart (order not valid for inpatient use)     Chief Complaint  Patient presents with  . Medical Management of Chronic Issues    Routine Visit    HPI:  Pt is a 84 y.o. female seen today [facility] for medical management of chronic diseases.she has a medical history of Hypertension,Hyperlipidemia,Hypothyroidism,OA,Osteoporosis among other conditions.she is seen in her room today with facility  Nurse supervisor present at bedside.Nurse state patient continues to refuse care and medication at times.sometimes Nursing staff able to administered medication later.she has had no recent fall episodes.Her weight remains stable.she continues to ambulates with her front wheel walker.No recent acute illnesses.       Past Medical History:  Diagnosis Date  . Arthritis   . Cataracts, bilateral   . Closed fracture of unspecified part of lower end of humerus   . Dementia ([place])   . Hypertension   . Insomnia, unspecified   . Macular degeneration (senile) of retina, unspecified   . Osteoarthritis   . Osteoarthrosis, unspecified whether generalized or localized, unspecified site   . Pancreatitis, acute 08/14/2013  . Retinal neovascularization NOS   . Senile osteoporosis   . Subarachnoid hemorrhage following injury ([place]) 03/31/2012  . Unspecified hearing loss   . Unspecified hypothyroidism   . Urinary tract infection 08/10/2013  . Vertebral fracture 08/09/2013   T7 10%, T12 70%    Past Surgical History:  Procedure Laterality Date  . ABDOMINAL HYSTERECTOMY  1942  . ANKLE FRACTURE SURGERY  1960  . BLADDER SUSPENSION    . CLOSED REDUCTION WITH HUMERAL PIN INSERTION  04/01/2012   Procedure: CLOSED REDUCTION WITH HUMERAL PIN INSERTION;  Surgeon: Schuyler Amor, MD;  Location: [facility];  Service: Orthopedics;  Laterality: Left;    No Known Allergies  Allergies as of 02/11/2018   No Known Allergies     Medication List        Accurate as of 02/11/18  1:46 PM. Always use your most recent med list.          amLODipine 10 MG tablet Commonly known as:  NORVASC Take 10 mg by mouth daily.   aspirin EC 81 MG tablet Take 81  mg by mouth daily.   BOOST GLUCOSE CONTROL Liqd Take 237 mLs by mouth 2 (two) times daily.   Cholecalciferol 1000 units capsule Take 2,000 Units by mouth daily.   ketoconazole 2 % shampoo Commonly known as:  NIZORAL Apply 1 application topically once a week. On  Wednesday   levothyroxine 88 MCG tablet Commonly known as:  SYNTHROID, LEVOTHROID Take 88 mcg by mouth daily before breakfast.   LORazepam 1 MG tablet Commonly known as:  ATIVAN Take 1 mg by mouth as needed for anxiety (Give 1 hour prior to dental appointment).   losartan 100 MG tablet Commonly known as:  COZAAR Take 1 tablet (100 mg total) by mouth daily.   metoprolol tartrate 100 MG tablet Commonly known as:  LOPRESSOR Take 100 mg by mouth 2 (two) times daily.   polyethylene glycol powder powder Commonly known as:  GLYCOLAX/MIRALAX Take 17 g by mouth daily. Hold for loose stool   QUEtiapine 25 MG tablet Commonly known as:  SEROQUEL Take 25 mg by mouth at bedtime.   raloxifene 60 MG tablet Commonly known as:  EVISTA Take 60 mg by mouth daily.       Review of Systems  Unable to perform ROS: Dementia (additional information provided by facility Nurse )    Immunization History  Administered Date(s) Administered  . Influenza-Unspecified 03/02/2013, 02/15/2014, 02/16/2015, 02/22/2016, 02/20/2017  . PPD Test 03/23/2012  . Pneumococcal Conjugate-13 12/17/2016  . Pneumococcal Polysaccharide-23 09/22/2002  . Td 11/17/2002  . Tdap 12/16/2016   Pertinent  Health Maintenance Due  Topic Date Due  . INFLUENZA VACCINE  12/11/2017  . DEXA SCAN  02/14/2020 (Originally 12/29/1981)  . PNA vac Low Risk Adult  Completed   Fall Risk  12/24/2017 12/13/2016 02/07/2015 02/22/2014 02/22/2014  Falls in the past year? Yes No No Yes No  Number falls in past yr: 1 - - 1 -  Injury with Fall? Yes - - - -  Risk for fall due to : - - - History of fall(s) -   Functional Status Survey:    Vitals:   02/11/18 1031  BP: 136/60  Pulse: 88  Resp: 18  Temp: 98.4 F (36.9 C)  TempSrc: Oral  SpO2: 95%  Weight: 135 lb (61.2 kg)  Height: 5\' 1"  (1.549 m)   Body mass index is 25.51 kg/m. Physical Exam  Constitutional: She appears well-developed and well-nourished.  Elderly in no acute  distress   HENT:  Head: Normocephalic.  Right Ear: External ear normal.  Left Ear: External ear normal.  Mouth/Throat: Oropharynx is clear and moist. No oropharyngeal exudate.  Eyes: Pupils are equal, round, and reactive to light. Conjunctivae and EOM are normal. Right eye exhibits no discharge. Left eye exhibits no discharge. No scleral icterus.  Corrective lens in place   Neck: Normal range of motion. No JVD present. No thyromegaly present.  Cardiovascular: Normal rate, regular rhythm, normal heart sounds and intact distal pulses. Exam reveals no gallop and no friction rub.  No murmur heard. Pulmonary/Chest: Effort normal and breath sounds normal. No respiratory distress. She has no wheezes. She has no rales.  Abdominal: Soft. Bowel sounds are normal. She exhibits no distension and no mass. There is no tenderness. There is no rebound and no guarding.  Genitourinary:  Genitourinary Comments: Incontinent   Musculoskeletal: She exhibits no tenderness.  Moves x 4 extremities except limited ROM with right Arm /shoulder abduction.unsteady gait ambulates with walker.Kyphosis present. Chronic left leg edema  Lymphadenopathy:    She  has no cervical adenopathy.  Neurological: Gait abnormal.  Very HOH and pleasantly confused at her baseline.   Skin: Skin is warm and dry. No rash noted. No erythema. No pallor.  Psychiatric: She has a normal mood and affect. Her speech is normal and behavior is normal. Judgment and thought content normal. Cognition and memory are impaired.  Nursing note and vitals reviewed.   Labs reviewed: Recent Labs    03/27/17 09/01/17 01/19/18  NA 139 135* 139  K 4.1 4.5 4.1  CL  --  100  --   CO2  --  27  --   BUN 19 24* 24*  CREATININE 0.6 0.7 0.66  CALCIUM  --  8.8 8.8   Recent Labs    09/01/17 01/19/18  AST 14 11  ALT 11 10  ALKPHOS 54 37  BILITOT  --  0.6  PROT 6.0 5.5  ALBUMIN 3.1 3   Recent Labs    03/27/17 09/01/17  WBC 5.8 9.6  HGB 13.2 12.3  HCT  38 35*  PLT 180 243   Lab Results  Component Value Date   TSH 5.58 01/19/2018   Lab Results  Component Value Date   HGBA1C 6.7 07/18/2016   Lab Results  Component Value Date   CHOL 146 01/23/2017   HDL 39 01/23/2017   LDLCALC 76 01/23/2017   TRIG 214 (A) 01/23/2017    Significant Diagnostic Results in last 30 days:  No results found.  Assessment/Plan 1. Essential hypertension B/p readings reviewed stable.continue on metoprolol 100 mg tablet twice daily,losartan 100 mg tablet daily and amlodipine 10 mg tablet daily.continue to monitor BMP recent CR at baseline.on ASA EC 81 mg tablet for prophylaxis.  2. Senile osteoporosis No recent fall episodes or fractures.continue on Evista 60 mg tablet daily and vitamin D supplement.Dexa scan ordered by MD in previous visit but patient's POA declined.  3. Acquired hypothyroidism Lab Results  Component Value Date   TSH 5.58 01/19/2018  Continue on levothyroxine 88 mcg tablet daily.  4. Vascular dementia with behavior disturbance ([place]) Continues to refuse care and medication at times.continue on Seroquel 25 mg tablet daily at bedtime.continue to reorient.  Family/ staff Communication: Reviewed plan of care with patient and facility Nurse supervisor   Labs/tests ordered: None   Sandrea Hughs, NP

## 2018-03-13 ENCOUNTER — Non-Acute Institutional Stay (SKILLED_NURSING_FACILITY): Payer: Medicare Other | Admitting: Family

## 2018-03-13 ENCOUNTER — Encounter: Payer: Self-pay | Admitting: Family

## 2018-03-13 DIAGNOSIS — F0151 Vascular dementia with behavioral disturbance: Secondary | ICD-10-CM | POA: Diagnosis not present

## 2018-03-13 DIAGNOSIS — E039 Hypothyroidism, unspecified: Secondary | ICD-10-CM

## 2018-03-13 DIAGNOSIS — K5901 Slow transit constipation: Secondary | ICD-10-CM

## 2018-03-13 DIAGNOSIS — I10 Essential (primary) hypertension: Secondary | ICD-10-CM

## 2018-03-13 DIAGNOSIS — F01518 Vascular dementia, unspecified severity, with other behavioral disturbance: Secondary | ICD-10-CM

## 2018-03-13 NOTE — Progress Notes (Signed)
Location:  [facility] Room Number: N02A Place of Service:  SNF (31) Provider: Reign Dziuba FNP-C   Dwight Burdo, Nelda Bucks, NP  Patient Care Team: Mistey Hoffert, Nelda Bucks, NP as PCP - General (Family Medicine) Mast, Man X, NP as Nurse Practitioner (Nurse Practitioner) Charlotte Crumb, MD as Consulting Physician (Orthopedic Surgery) Regal, Tamala Fothergill, DPM as Consulting Physician (Podiatry) Gaynelle Arabian, MD as Consulting Physician (Orthopedic Surgery) Zebedee Iba., MD as Referring Physician (Ophthalmology) Newman Pies, MD as Consulting Physician (Neurosurgery) Melina Modena, [facility] (Skilled Nursing and [place])  Extended Emergency Contact Information Primary Emergency Contact: [place] Phone: [phone] Mobile Phone: [phone] Relation: Daughter Secondary Emergency Contact: [place] of [place] Phone: [phone] Relation: Grandson  Code Status:  DNR Goals of care: Advanced Directive information Advanced Directives 03/13/2018  Does Patient Have a Medical Advance Directive? Yes  Type of Advance Directive Living will;Healthcare Power of [place];Out of facility DNR (pink MOST or yellow form)  Does patient want to make changes to medical advance directive? No - Patient declined  Copy of [place] in Chart? Yes  Pre-existing out of facility DNR order (yellow form or pink MOST form) Yellow form placed in chart (order not valid for inpatient use);Pink MOST form placed in chart (order not valid for inpatient use)     Chief Complaint  Patient presents with  . Medical Management of Chronic Issues    Routine Visit    HPI:  Pt is a 84 y.o. female seen today [place] for medical management of chronic diseases.she has a medical history of HTN,CVA,Hypothyrodism,GERD,AMD,OA among other conditions.she is seen in her room today with facility Nurse present at  bedside.Nurse reports patient continue to be uncooperative with staff at times refusing incontinent care.No aggressive behaviors reported.Partient observed participating in facility activities this morning.she continues to ambulate with Front wheel walker.Nurse states patient feeds self with supervision.No recent fall episode reported.she has had a progressive weight gain which is beneficial due to previous weight loss.Weight 131.2 lbs (01/07/2018); Wt 133 lbs (01/11/2018) and Wt 135 lbs (02/10/2018).    Past Medical History:  Diagnosis Date  . Arthritis   . Cataracts, bilateral   . Closed fracture of unspecified part of lower end of humerus   . Dementia ([place])   . Hypertension   . Insomnia, unspecified   . Macular degeneration (senile) of retina, unspecified   . Osteoarthritis   . Osteoarthrosis, unspecified whether generalized or localized, unspecified site   . Pancreatitis, acute 08/14/2013  . Retinal neovascularization NOS   . Senile osteoporosis   . Subarachnoid hemorrhage following injury ([place]) 03/31/2012  . Unspecified hearing loss   . Unspecified hypothyroidism   . Urinary tract infection 08/10/2013  . Vertebral fracture 08/09/2013   T7 10%, T12 70%    Past Surgical History:  Procedure Laterality Date  . ABDOMINAL HYSTERECTOMY  1942  . ANKLE FRACTURE SURGERY  1960  . BLADDER SUSPENSION    . CLOSED REDUCTION WITH HUMERAL PIN INSERTION  04/01/2012   Procedure: CLOSED REDUCTION WITH HUMERAL PIN INSERTION;  Surgeon: Schuyler Amor, MD;  Location: [facility];  Service: Orthopedics;  Laterality: Left;    No Known Allergies  Allergies as of 03/13/2018   No Known Allergies     Medication List        Accurate as of 03/13/18  1:37 PM. Always use your most recent med list.  amLODipine 10 MG tablet Commonly known as:  NORVASC Take 10 mg by mouth daily.   aspirin 81 MG chewable tablet Chew 81 mg by mouth daily.   BOOST GLUCOSE CONTROL Liqd Take 237 mLs by mouth 2 (two)  times daily.   Cholecalciferol 1000 units capsule Take 2,000 Units by mouth daily.   ketoconazole 2 % shampoo Commonly known as:  NIZORAL Apply 1 application topically once a week. On Wednesday   levothyroxine 88 MCG tablet Commonly known as:  SYNTHROID, LEVOTHROID Take 88 mcg by mouth daily before breakfast.   losartan 100 MG tablet Commonly known as:  COZAAR Take 1 tablet (100 mg total) by mouth daily.   metoprolol tartrate 100 MG tablet Commonly known as:  LOPRESSOR Take 100 mg by mouth 2 (two) times daily.   polyethylene glycol powder powder Commonly known as:  GLYCOLAX/MIRALAX Take 17 g by mouth daily. Hold for loose stool   QUEtiapine 25 MG tablet Commonly known as:  SEROQUEL Take 25 mg by mouth at bedtime.       Review of Systems  Unable to perform ROS: Dementia (additional information provided by facility Nurse present during visit )    Immunization History  Administered Date(s) Administered  . Influenza-Unspecified 03/02/2013, 02/15/2014, 02/16/2015, 02/22/2016, 02/20/2017, 02/23/2018  . PPD Test 03/23/2012  . Pneumococcal Conjugate-13 12/17/2016  . Pneumococcal Polysaccharide-23 09/22/2002  . Td 11/17/2002  . Tdap 12/16/2016   Pertinent  Health Maintenance Due  Topic Date Due  . INFLUENZA VACCINE  Completed  . DEXA SCAN  Completed  . PNA vac Low Risk Adult  Completed   Fall Risk  12/24/2017 12/13/2016 02/07/2015 02/22/2014 02/22/2014  Falls in the past year? Yes No No Yes No  Number falls in past yr: 1 - - 1 -  Injury with Fall? Yes - - - -  Risk for fall due to : - - - History of fall(s) -   Functional Status Survey:    Vitals:   03/13/18 1023  BP: 136/60  Pulse: 68  Resp: 16  Temp: (!) 97.1 F (36.2 C)  TempSrc: Oral  SpO2: 95%  Weight: 135 lb (61.2 kg)  Height: 5\' 1"  (1.549 m)   Body mass index is 25.51 kg/m. Physical Exam  Constitutional: She appears well-developed and well-nourished.  Elderly in no acute distress   HENT:  Head:  Normocephalic.  Mouth/Throat: Oropharynx is clear and moist. No oropharyngeal exudate.  TM not visualized patient uncooperative.HOH   Eyes: Pupils are equal, round, and reactive to light. Conjunctivae are normal. Right eye exhibits no discharge. Left eye exhibits no discharge. No scleral icterus.  Eye glasses in place   Neck: Neck supple. No JVD present. No thyromegaly present.  Cardiovascular: Normal rate, regular rhythm, normal heart sounds and intact distal pulses. Exam reveals no gallop and no friction rub.  No murmur heard. Pulmonary/Chest: Effort normal and breath sounds normal. No respiratory distress. She has no wheezes. She has no rales.  Abdominal: Soft. Bowel sounds are normal. She exhibits no distension and no mass. There is no tenderness. There is no rebound and no guarding.  Genitourinary:  Genitourinary Comments: Incontinent   Musculoskeletal: She exhibits no edema or tenderness.  Moves x 4 extremities unsteady gait ambulates with front wheel walker.  Lymphadenopathy:    She has no cervical adenopathy.  Neurological: She is alert.  Pleasantly confused at her baseline   Skin: Skin is warm and dry. Capillary refill takes 2 to 3 seconds. No rash noted. No  erythema. No pallor.  Skin intact   Psychiatric: She has a normal mood and affect. Her speech is normal and behavior is normal. Judgment and thought content normal. Cognition and memory are impaired.  Uncooperative at times   Nursing note and vitals reviewed.   Labs reviewed: Recent Labs    03/27/17 09/01/17 01/19/18  NA 139 135* 139  K 4.1 4.5 4.1  CL  --  100  --   CO2  --  27  --   BUN 19 24* 24*  CREATININE 0.6 0.7 0.66  CALCIUM  --  8.8 8.8   Recent Labs    09/01/17 01/19/18  AST 14 11  ALT 11 10  ALKPHOS 54 37  BILITOT  --  0.6  PROT 6.0 5.5  ALBUMIN 3.1 3   Recent Labs    03/27/17 09/01/17  WBC 5.8 9.6  HGB 13.2 12.3  HCT 38 35*  PLT 180 243   Lab Results  Component Value Date   TSH 5.58  01/19/2018   Lab Results  Component Value Date   HGBA1C 6.7 07/18/2016   Lab Results  Component Value Date   CHOL 146 01/23/2017   HDL 39 01/23/2017   LDLCALC 76 01/23/2017   TRIG 214 (A) 01/23/2017    Significant Diagnostic Results in last 30 days:  No results found.  Assessment/Plan 1. Essential hypertension Latest B/p reviewed stable.continue on Metoprolol 100 mg tablet twice daily,cozaar 100 mg tablet daily and amlodipine 10 mg tablet daily.monitor BMP.  2. Acquired hypothyroidism Lab Results  Component Value Date   TSH 5.58 01/19/2018  Continue on levothyroxine 88 mcg tablet daily.monitor TSH level   3. Slow transit constipation Current regimen effective.continue to encourage oral intake and fluid intake.  4. Vascular dementia with behavior disturbance ([place]) Uncooperative with facility staff at times.No combativeness reported.weight gain noted.continue on Seroquel 25 mg tablet daily at bedtime.continue to assist with her ADL's.continue to re-orient.  Family/ staff Communication: Reviewed plan of care with patient and facility Nurse.   Labs/tests ordered: None   Ahmadou Bolz C Anaiah Mcmannis, NP

## 2018-04-14 ENCOUNTER — Non-Acute Institutional Stay (SKILLED_NURSING_FACILITY): Payer: Medicare Other | Admitting: Family Medicine

## 2018-04-14 ENCOUNTER — Encounter: Payer: Self-pay | Admitting: Family Medicine

## 2018-04-14 DIAGNOSIS — F0151 Vascular dementia with behavioral disturbance: Secondary | ICD-10-CM | POA: Diagnosis not present

## 2018-04-14 DIAGNOSIS — E039 Hypothyroidism, unspecified: Secondary | ICD-10-CM

## 2018-04-14 DIAGNOSIS — F01518 Vascular dementia, unspecified severity, with other behavioral disturbance: Secondary | ICD-10-CM

## 2018-04-14 DIAGNOSIS — H903 Sensorineural hearing loss, bilateral: Secondary | ICD-10-CM | POA: Diagnosis not present

## 2018-04-14 DIAGNOSIS — I10 Essential (primary) hypertension: Secondary | ICD-10-CM

## 2018-04-14 NOTE — Progress Notes (Signed)
Provider:  Alain Honey, MD Location:  [place] [place] Room Number: 2 Place of Service:  SNF (31)  PCP: Ngetich, Nelda Bucks, NP Patient Care Team: Ngetich, Nelda Bucks, NP as PCP - General (Family Medicine) Mast, Man X, NP as Nurse Practitioner (Nurse Practitioner) Charlotte Crumb, MD as Consulting Physician (Orthopedic Surgery) Regal, Tamala Fothergill, DPM as Consulting Physician (Podiatry) Gaynelle Arabian, MD as Consulting Physician (Orthopedic Surgery) Zebedee Iba., MD as Referring Physician (Ophthalmology) Newman Pies, MD as Consulting Physician (Neurosurgery) Melina Modena, [facility] (Skilled Nursing and [place])  Extended Emergency Contact Information Primary Emergency Contact: [place] of [place] Phone: [phone] Mobile Phone: [phone] Relation: Daughter Secondary Emergency Contact: [place] of [place] Phone: [phone] Relation: Grandson  Code Status: DNR Goals of Care: Advanced Directive information Advanced Directives 04/14/2018  Does Patient Have a Medical Advance Directive? Yes  Type of Paramedic of [place];Out of facility DNR (pink MOST or yellow form)  Does patient want to make changes to medical advance directive? No - Patient declined  Copy of [place] in Chart? Yes - validated most recent copy scanned in chart (See row information)  Pre-existing out of facility DNR order (yellow form or pink MOST form) Yellow form placed in chart (order not valid for inpatient use);Pink MOST form placed in chart (order not valid for inpatient use)      Chief Complaint  Patient presents with  . Medical Management of Chronic Issues    Routine    HPI: Patient is a 84 y.o. female seen today for medical management of chronic problems including: Hypertension vascular dementia hypothyroidism, and gait disturbance nursing staff report no new problems.   Her blood pressure is managed with losartan and amlodipine.  She takes levothyroxine for hypothyroidism.  She also takes E [place] for osteoporosis.  Appetite is good with recent weight gain; no new falls.  Uses rolling walker for ambulation  Past Medical History:  Diagnosis Date  . Arthritis   . Cataracts, bilateral   . Closed fracture of unspecified part of lower end of humerus   . Dementia ([place])   . Hypertension   . Insomnia, unspecified   . Macular degeneration (senile) of retina, unspecified   . Osteoarthritis   . Osteoarthrosis, unspecified whether generalized or localized, unspecified site   . Pancreatitis, acute 08/14/2013  . Retinal neovascularization NOS   . Senile osteoporosis   . Subarachnoid hemorrhage following injury ([place]) 03/31/2012  . Unspecified hearing loss   . Unspecified hypothyroidism   . Urinary tract infection 08/10/2013  . Vertebral fracture 08/09/2013   T7 10%, T12 70%    Past Surgical History:  Procedure Laterality Date  . ABDOMINAL HYSTERECTOMY  1942  . ANKLE FRACTURE SURGERY  1960  . BLADDER SUSPENSION    . CLOSED REDUCTION WITH HUMERAL PIN INSERTION  04/01/2012   Procedure: CLOSED REDUCTION WITH HUMERAL PIN INSERTION;  Surgeon: Schuyler Amor, MD;  Location: [facility];  Service: Orthopedics;  Laterality: Left;    reports that she has never smoked. She has never used smokeless tobacco. She reports that she does not drink alcohol or use drugs. Social History   Socioeconomic History  . Marital status: Widowed    Spouse name: Not on file  . Number of children: Not on file  . Years of education: Not on file  . Highest education level: Not on file  Occupational History  . Occupation: homemaker  Social  Needs  . Financial resource strain: Not hard at all  . Food insecurity:    Worry: Never true    Inability: Never true  . Transportation needs:    Medical: No    Non-medical: No  Tobacco Use  . Smoking status: Never Smoker  . Smokeless tobacco: Never  Used  Substance and Sexual Activity  . Alcohol use: No  . Drug use: No  . Sexual activity: Never  Lifestyle  . Physical activity:    Days per week: 3 days    Minutes per session: 30 min  . Stress: Not on file  Relationships  . Social connections:    Talks on phone: Not on file    Gets together: Not on file    Attends religious service: Not on file    Active member of club or organization: Not on file    Attends meetings of clubs or organizations: Not on file    Relationship status: Widowed  . Intimate partner violence:    Fear of current or ex partner: No    Emotionally abused: No    Physically abused: No    Forced sexual activity: No  Other Topics Concern  . Not on file  Social History Narrative   Lives at [facility] since 2013, [place] to [facility] 06/26/16   Widowed   Never smoked   Alcohol none   Exercise none   Walks with walker   DNR, POA,  Living Will                   Functional Status Survey:    Family History  Problem Relation Age of Onset  . Hypertension Mother     Health Maintenance  Topic Date Due  . Samul Dada  12/17/2026  . INFLUENZA VACCINE  Completed  . DEXA SCAN  Completed  . PNA vac Low Risk Adult  Completed    No Known Allergies  Outpatient Encounter Medications as of 04/14/2018  Medication Sig  . amLODipine (NORVASC) 10 MG tablet Take 10 mg by mouth daily.  Marland Kitchen aspirin 81 MG chewable tablet Chew 81 mg by mouth daily.  . Cholecalciferol 1000 units capsule Take 2,000 Units by mouth daily.   Marland Kitchen ketoconazole (NIZORAL) 2 % shampoo Apply 1 application topically once a week. On Wednesday  . levothyroxine (SYNTHROID, LEVOTHROID) 88 MCG tablet Take 88 mcg by mouth daily before breakfast.  . losartan (COZAAR) 100 MG tablet Take 1 tablet (100 mg total) by mouth daily.  . metoprolol (LOPRESSOR) 100 MG tablet Take 100 mg by mouth 2 (two) times daily.   . Nutritional Supplements (BOOST GLUCOSE CONTROL) LIQD Take 237 mLs by mouth 2 (two) times daily.   .  polyethylene glycol powder (GLYCOLAX/MIRALAX) powder Take 17 g by mouth daily. Hold for loose stool  . QUEtiapine (SEROQUEL) 25 MG tablet Take 25 mg by mouth at bedtime.  . raloxifene (EVISTA) 60 MG tablet Take 60 mg by mouth daily.   No facility-administered encounter medications on file as of 04/14/2018.     Review of Systems  Unable to perform ROS: Dementia  Constitutional: Negative.   HENT: Positive for hearing loss.   Respiratory: Negative.   Cardiovascular: Negative.   Psychiatric/Behavioral: Positive for confusion and decreased concentration.    Vitals:   04/14/18 0911  BP: 136/60  Pulse: 68  Resp: 16  Temp: (!) 97.1 F (36.2 C)  SpO2: 95%  Weight: 135 lb (61.2 kg)  Height: 5\' 1"  (1.549 m)   Body mass  index is 25.51 kg/m. Physical Exam  Constitutional: She appears well-developed and well-nourished.  HENT:  Head: Normocephalic.  Mouth/Throat: Oropharynx is clear and moist.  Eyes: Pupils are equal, round, and reactive to light.  Cardiovascular: Normal rate and regular rhythm.  Pulmonary/Chest: Effort normal and breath sounds normal.  Abdominal: Soft.  Neurological: She is alert.  Oriented to self  Psychiatric: She has a normal mood and affect.  Nursing note and vitals reviewed.   Labs reviewed: Basic Metabolic Panel: Recent Labs    09/01/17 01/19/18  NA 135* 139  K 4.5 4.1  CL 100  --   CO2 27  --   BUN 24* 24*  CREATININE 0.7 0.66  CALCIUM 8.8 8.8   Liver Function Tests: Recent Labs    09/01/17 01/19/18  AST 14 11  ALT 11 10  ALKPHOS 54 37  BILITOT  --  0.6  PROT 6.0 5.5  ALBUMIN 3.1 3   No results for input(s): LIPASE, AMYLASE in the last 8760 hours. No results for input(s): AMMONIA in the last 8760 hours. CBC: Recent Labs    09/01/17  WBC 9.6  HGB 12.3  HCT 35*  PLT 243   Cardiac Enzymes: No results for input(s): CKTOTAL, CKMB, CKMBINDEX, TROPONINI in the last 8760 hours. BNP: Invalid input(s): POCBNP Lab Results  Component Value  Date   HGBA1C 6.7 07/18/2016   Lab Results  Component Value Date   TSH 5.58 01/19/2018   No results found for: VITAMINB12 No results found for: FOLATE No results found for: IRON, TIBC, FERRITIN  Imaging and Procedures obtained prior to SNF admission: Ct Head Wo Contrast  Result Date: 04/02/2017 CLINICAL DATA:  Posttraumatic head and neck pain after fall. EXAM: CT HEAD WITHOUT CONTRAST CT CERVICAL SPINE WITHOUT CONTRAST TECHNIQUE: Multidetector CT imaging of the head and cervical spine was performed following the standard protocol without intravenous contrast. Multiplanar CT image reconstructions of the cervical spine were also generated. COMPARISON:  09/19/2016 FINDINGS: CT HEAD FINDINGS BRAIN: There is moderate sulcal and ventricular prominence consistent with superficial and central atrophy. No intraparenchymal hemorrhage, mass effect nor midline shift. Moderate degree of periventricular and subcortical white matter hypodensity consistent with chronic microvascular ischemia. No acute large vascular territory infarcts. No abnormal extra-axial fluid collections. Basal cisterns are not effaced and midline. VASCULAR: Moderate calcific atherosclerosis of the carotid siphons. SKULL: No skull fracture. No significant scalp soft tissue swelling. SINUSES/ORBITS: The mastoid air-cells are clear. The included paranasal sinuses are well-aerated.The included ocular globes and orbital contents are non-suspicious. OTHER: None. CT CERVICAL SPINE FINDINGS Alignment: Maintained cervical lordosis. Osteoarthritis of the atlantodental interval. Intact craniocervical relationship. Skull base and vertebrae: No acute cervical spine fracture or listhesis. Intact skullbase. Soft tissues and spinal canal: No prevertebral fluid or swelling. No visible canal hematoma. Disc levels: Cervical spondylosis with moderate to marked disc space narrowing from C4 through C7 with small posterior marginal osteophytes and uncovertebral  joint osteoarthritis contributing to bilateral mild neural foraminal encroachment. Upper chest: Chronic stable apical pleuroparenchymal scarring and thickening. Other: None IMPRESSION: 1. No acute intracranial abnormality. Atrophy with extensive chronic microvascular ischemic disease of white matter. 2. Cervical spondylosis without acute cervical spine fracture. Electronically Signed   By: Ashley Royalty M.D.   On: 04/02/2017 21:58   Ct Cervical Spine Wo Contrast  Result Date: 04/02/2017 CLINICAL DATA:  Posttraumatic head and neck pain after fall. EXAM: CT HEAD WITHOUT CONTRAST CT CERVICAL SPINE WITHOUT CONTRAST TECHNIQUE: Multidetector CT imaging of the head and cervical  spine was performed following the standard protocol without intravenous contrast. Multiplanar CT image reconstructions of the cervical spine were also generated. COMPARISON:  09/19/2016 FINDINGS: CT HEAD FINDINGS BRAIN: There is moderate sulcal and ventricular prominence consistent with superficial and central atrophy. No intraparenchymal hemorrhage, mass effect nor midline shift. Moderate degree of periventricular and subcortical white matter hypodensity consistent with chronic microvascular ischemia. No acute large vascular territory infarcts. No abnormal extra-axial fluid collections. Basal cisterns are not effaced and midline. VASCULAR: Moderate calcific atherosclerosis of the carotid siphons. SKULL: No skull fracture. No significant scalp soft tissue swelling. SINUSES/ORBITS: The mastoid air-cells are clear. The included paranasal sinuses are well-aerated.The included ocular globes and orbital contents are non-suspicious. OTHER: None. CT CERVICAL SPINE FINDINGS Alignment: Maintained cervical lordosis. Osteoarthritis of the atlantodental interval. Intact craniocervical relationship. Skull base and vertebrae: No acute cervical spine fracture or listhesis. Intact skullbase. Soft tissues and spinal canal: No prevertebral fluid or swelling. No  visible canal hematoma. Disc levels: Cervical spondylosis with moderate to marked disc space narrowing from C4 through C7 with small posterior marginal osteophytes and uncovertebral joint osteoarthritis contributing to bilateral mild neural foraminal encroachment. Upper chest: Chronic stable apical pleuroparenchymal scarring and thickening. Other: None IMPRESSION: 1. No acute intracranial abnormality. Atrophy with extensive chronic microvascular ischemic disease of white matter. 2. Cervical spondylosis without acute cervical spine fracture. Electronically Signed   By: Ashley Royalty M.D.   On: 04/02/2017 21:58    Assessment/Plan 1. Essential hypertension Controlled on double drug regimen including metoprolol, amlodipine, losartan  2. Acquired hypothyroidism Last TSH was 5.58 two months ago.  3. Vascular dementia with behavior disturbance ([place]) Does take Seroquel for behaviors(sometimes not cooperative)  4. Sensorineural hearing loss (SNHL) of both ears Able to hear if spoken loudly to R ear   Family/ staff Communication: Findings communicated to staff  Labs/tests ordered:  Lillette Boxer. Sabra Heck, [place] [address] [place], [place] Office [phone]

## 2018-05-19 ENCOUNTER — Encounter: Payer: Self-pay | Admitting: Nurse Practitioner

## 2018-05-19 ENCOUNTER — Non-Acute Institutional Stay (SKILLED_NURSING_FACILITY): Payer: Medicare Other | Admitting: Nurse Practitioner

## 2018-05-19 DIAGNOSIS — K5901 Slow transit constipation: Secondary | ICD-10-CM

## 2018-05-19 DIAGNOSIS — F01518 Vascular dementia, unspecified severity, with other behavioral disturbance: Secondary | ICD-10-CM

## 2018-05-19 DIAGNOSIS — E039 Hypothyroidism, unspecified: Secondary | ICD-10-CM

## 2018-05-19 DIAGNOSIS — I10 Essential (primary) hypertension: Secondary | ICD-10-CM

## 2018-05-19 DIAGNOSIS — F0151 Vascular dementia with behavioral disturbance: Secondary | ICD-10-CM

## 2018-05-19 NOTE — Assessment & Plan Note (Signed)
Blood pressure is controlled, continue Metoprolol 100mg  bid, Losartan 100mg  qd, Amlodipine 10mg  qd, update CMP

## 2018-05-19 NOTE — Assessment & Plan Note (Signed)
Stable, continue Levothyroxine 110mcg qd, update TSH CBC

## 2018-05-19 NOTE — Assessment & Plan Note (Signed)
Stable, continue MiraLax daily.  

## 2018-05-19 NOTE — Progress Notes (Signed)
Location:  [facility] Room Number: 2 Place of Service:  SNF (31) Provider:  Marlana Latus  NP  Virgie Dad, MD  Patient Care Team: Virgie Dad, MD as PCP - General (Internal Medicine) Jacoby Zanni X, NP as Nurse Practitioner (Nurse Practitioner) Charlotte Crumb, MD as Consulting Physician (Orthopedic Surgery) Regal, Tamala Fothergill, DPM as Consulting Physician (Podiatry) Gaynelle Arabian, MD as Consulting Physician (Orthopedic Surgery) Zebedee Iba., MD as Referring Physician (Ophthalmology) Newman Pies, MD as Consulting Physician (Neurosurgery) Melina Modena, [facility] (Skilled Nursing and [place]) Terisa Belardo X, NP as Nurse Practitioner (Internal Medicine)  Extended Emergency Contact Information Primary Emergency Contact: [place] of [place] Phone: [phone] Mobile Phone: [phone] Relation: Daughter Secondary Emergency Contact: [place] of [place] Phone: [phone] Relation: Grandson  Code Status:  DNR Goals of care: Advanced Directive information Advanced Directives 05/19/2018  Does Patient Have a Medical Advance Directive? Yes  Type of Paramedic of [place];Out of facility DNR (pink MOST or yellow form);Living will  Does patient want to make changes to medical advance directive? No - Patient declined  Copy of [place] in Chart? Yes - validated most recent copy scanned in chart (See row information)  Pre-existing out of facility DNR order (yellow form or pink MOST form) Yellow form placed in chart (order not valid for inpatient use);Pink MOST form placed in chart (order not valid for inpatient use)     Chief Complaint  Patient presents with  . Medical Management of Chronic Issues    HPI:  Pt is a 85 y.o. female seen today for medical management of chronic diseases.     The patient resides in SNF [facility] for safety and care assistance,  on Quetiapine 25mg  for psychosis. Hx of HTN, blood pressure is controlled on metoprolol 100mg  bid, Losartan 100mg  qd, Amlodipine 10mg  qd. No constipation while taking MiraLax daily. Hypothyroidism, on Levothyroxine 30mcg qd, last TSH 5s.   Past Medical History:  Diagnosis Date  . Arthritis   . Cataracts, bilateral   . Closed fracture of unspecified part of lower end of humerus   . Dementia ([place])   . Hypertension   . Insomnia, unspecified   . Macular degeneration (senile) of retina, unspecified   . Osteoarthritis   . Osteoarthrosis, unspecified whether generalized or localized, unspecified site   . Pancreatitis, acute 08/14/2013  . Retinal neovascularization NOS   . Senile osteoporosis   . Subarachnoid hemorrhage following injury ([place]) 03/31/2012  . Unspecified hearing loss   . Unspecified hypothyroidism   . Urinary tract infection 08/10/2013  . Vertebral fracture 08/09/2013   T7 10%, T12 70%    Past Surgical History:  Procedure Laterality Date  . ABDOMINAL HYSTERECTOMY  1942  . ANKLE FRACTURE SURGERY  1960  . BLADDER SUSPENSION    . CLOSED REDUCTION WITH HUMERAL PIN INSERTION  04/01/2012   Procedure: CLOSED REDUCTION WITH HUMERAL PIN INSERTION;  Surgeon: Schuyler Amor, MD;  Location: [facility];  Service: Orthopedics;  Laterality: Left;    No Known Allergies  Outpatient Encounter Medications as of 05/19/2018  Medication Sig  . amLODipine (NORVASC) 10 MG tablet Take 10 mg by mouth daily.  Marland Kitchen aspirin 81 MG chewable tablet Chew 81 mg by mouth daily.  . Cholecalciferol 1000 units capsule Take 2,000 Units by mouth daily.   Marland Kitchen ketoconazole (NIZORAL) 2 % shampoo Apply 1 application topically once a week. On Wednesday  .  levothyroxine (SYNTHROID, LEVOTHROID) 88 MCG tablet Take 88 mcg by mouth daily before breakfast.  . losartan (COZAAR) 100 MG tablet Take 1 tablet (100 mg total) by mouth daily.  . metoprolol (LOPRESSOR) 100 MG tablet Take 100 mg by mouth 2 (two) times daily.   .  Nutritional Supplements (BOOST GLUCOSE CONTROL) LIQD Take 237 mLs by mouth 2 (two) times daily.   . polyethylene glycol powder (GLYCOLAX/MIRALAX) powder Take 17 g by mouth daily. Hold for loose stool  . QUEtiapine (SEROQUEL) 25 MG tablet Take 25 mg by mouth at bedtime.  . raloxifene (EVISTA) 60 MG tablet Take 60 mg by mouth daily.   No facility-administered encounter medications on file as of 05/19/2018.    ROS was provided with assistance of staff Review of Systems  Constitutional: Negative for activity change, appetite change, chills, diaphoresis, fatigue, fever and unexpected weight change.  HENT: Positive for hearing loss. Negative for congestion and voice change.   Respiratory: Negative for cough, shortness of breath and wheezing.   Cardiovascular: Positive for leg swelling. Negative for chest pain and palpitations.  Gastrointestinal: Negative for abdominal distention, constipation, diarrhea, nausea and vomiting.  Genitourinary: Negative for difficulty urinating, dysuria and urgency.       Incontinent of urine.   Musculoskeletal: Positive for arthralgias and gait problem.  Skin: Negative for color change and pallor.  Neurological: Negative for dizziness, speech difficulty, weakness and headaches.       Dementia  Psychiatric/Behavioral: Positive for confusion and decreased concentration. Negative for agitation, behavioral problems, hallucinations and sleep disturbance. The patient is not nervous/anxious.     Immunization History  Administered Date(s) Administered  . Influenza-Unspecified 03/02/2013, 02/15/2014, 02/16/2015, 02/22/2016, 02/20/2017, 02/23/2018  . PPD Test 03/23/2012  . Pneumococcal Conjugate-13 12/17/2016  . Pneumococcal Polysaccharide-23 09/22/2002  . Td 11/17/2002  . Tdap 12/16/2016   Pertinent  Health Maintenance Due  Topic Date Due  . INFLUENZA VACCINE  Completed  . DEXA SCAN  Completed  . PNA vac Low Risk Adult  Completed   Fall Risk  12/24/2017 12/13/2016  02/07/2015 02/22/2014 02/22/2014  Falls in the past year? Yes No No Yes No  Number falls in past yr: 1 - - 1 -  Injury with Fall? Yes - - - -  Risk for fall due to : - - - History of fall(s) -   Functional Status Survey:    Vitals:   05/19/18 0858  BP: 136/60  Pulse: 76  Resp: 18  Temp: 97.7 F (36.5 C)  SpO2: 95%  Weight: 131 lb 6.4 oz (59.6 kg)  Height: 5\' 1"  (1.549 m)   Body mass index is 24.83 kg/m. Physical Exam Constitutional:      General: She is not in acute distress.    Appearance: Normal appearance. She is normal weight. She is not ill-appearing, toxic-appearing or diaphoretic.  HENT:     Head: Normocephalic and atraumatic.     Mouth/Throat:     Mouth: Mucous membranes are moist.  Eyes:     Extraocular Movements: Extraocular movements intact.     Pupils: Pupils are equal, round, and reactive to light.  Cardiovascular:     Rate and Rhythm: Normal rate and regular rhythm.     Heart sounds: No murmur.  Pulmonary:     Effort: Pulmonary effort is normal.     Breath sounds: No wheezing, rhonchi or rales.     Comments: Decreased air entry Abdominal:     Palpations: Abdomen is soft.     Tenderness:  There is no abdominal tenderness. There is no guarding or rebound.  Musculoskeletal:     Right lower leg: No edema.     Left lower leg: Edema present.     Comments: Trace. Ambulates with walker.   Skin:    General: Skin is warm and dry.     Coloration: Skin is not pale.     Findings: No erythema or rash.  Neurological:     General: No focal deficit present.     Mental Status: She is alert. Mental status is at baseline.     Coordination: Coordination normal.     Gait: Gait abnormal.     Comments: Oriented to person and her room on unit.   Psychiatric:        Mood and Affect: Mood normal.        Behavior: Behavior normal.     Labs reviewed: Recent Labs    09/01/17 01/19/18  NA 135* 139  K 4.5 4.1  CL 100  --   CO2 27  --   BUN 24* 24*  CREATININE 0.7  0.66  CALCIUM 8.8 8.8   Recent Labs    09/01/17 01/19/18  AST 14 11  ALT 11 10  ALKPHOS 54 37  BILITOT  --  0.6  PROT 6.0 5.5  ALBUMIN 3.1 3   Recent Labs    09/01/17  WBC 9.6  HGB 12.3  HCT 35*  PLT 243   Lab Results  Component Value Date   TSH 5.58 01/19/2018   Lab Results  Component Value Date   HGBA1C 6.7 07/18/2016   Lab Results  Component Value Date   CHOL 146 01/23/2017   HDL 39 01/23/2017   LDLCALC 76 01/23/2017   TRIG 214 (A) 01/23/2017    Significant Diagnostic Results in last 30 days:  No results found.  Assessment/Plan Hypertension Blood pressure is controlled, continue Metoprolol 100mg  bid, Losartan 100mg  qd, Amlodipine 10mg  qd, update CMP  Hypothyroidism Stable, continue Levothyroxine 39mcg qd, update TSH CBC  Vascular dementia with behavior disturbance Continue Seroquel 25mg  qd for psychosis. Continue SNF [facility] for safety and care assistance.   Constipation Stable, continue MiraLax daily.      Family/ staff Communication: plan of care reviewed with the patient and charge nurse.   Labs/tests ordered:  CBC CMP TSH   Time spend 25 minutes.

## 2018-05-19 NOTE — Assessment & Plan Note (Signed)
Continue Seroquel 25mg  qd for psychosis. Continue SNF [facility] for safety and care assistance.

## 2018-05-21 ENCOUNTER — Encounter: Payer: Self-pay | Admitting: Nurse Practitioner

## 2018-05-21 LAB — BASIC METABOLIC PANEL
BUN: 28 — AB (ref 4–21)
CREATININE: 0.7 (ref <=1.1)
GLUCOSE: 130
POTASSIUM: 4.6 (ref 3.4–5.3)
Sodium: 139 (ref 137–147)

## 2018-05-21 LAB — HEPATIC FUNCTION PANEL
ALT: 11 (ref 7–35)
AST: 14 (ref 13–35)
Alkaline Phosphatase: 51 (ref 25–125)
Bilirubin, Total: 0.6

## 2018-05-21 LAB — CBC AND DIFFERENTIAL
HCT: 38 (ref 36–46)
Hemoglobin: 12.9 (ref 12.0–16.0)
Platelets: 223 (ref 150–399)
WBC: 11.8

## 2018-05-21 LAB — TSH: TSH: 3.45 (ref <=5.90)

## 2018-05-22 ENCOUNTER — Other Ambulatory Visit: Payer: Self-pay | Admitting: *Deleted

## 2018-05-22 LAB — COMPLETE METABOLIC PANEL WITH GFR
Albumin: 3.5
Calcium: 9.1
Carbon Dioxide, Total: 23
Chloride: 105
EGFR (Non-African Amer.): 73
Globulin: 2.9
Total Protein: 6.4 g/dL

## 2018-05-28 ENCOUNTER — Other Ambulatory Visit: Payer: Self-pay

## 2018-06-15 ENCOUNTER — Emergency Department (HOSPITAL_COMMUNITY): Payer: Medicare Other

## 2018-06-15 ENCOUNTER — Emergency Department (HOSPITAL_COMMUNITY)
Admission: EM | Admit: 2018-06-15 | Discharge: 2018-06-16 | Disposition: A | Discharge status: Home / Self Care | Payer: Medicare Other | Attending: Emergency Medicine | Admitting: Emergency Medicine

## 2018-06-15 ENCOUNTER — Other Ambulatory Visit: Payer: Self-pay

## 2018-06-15 ENCOUNTER — Encounter (HOSPITAL_COMMUNITY): Payer: Self-pay | Admitting: Emergency Medicine

## 2018-06-15 DIAGNOSIS — W19XXXA Unspecified fall, initial encounter: Secondary | ICD-10-CM | POA: Diagnosis not present

## 2018-06-15 DIAGNOSIS — S0083XA Contusion of other part of head, initial encounter: Secondary | ICD-10-CM | POA: Diagnosis not present

## 2018-06-15 DIAGNOSIS — Y92122 Bedroom in nursing home as the place of occurrence of the external cause: Secondary | ICD-10-CM | POA: Insufficient documentation

## 2018-06-15 DIAGNOSIS — Y9389 Activity, other specified: Secondary | ICD-10-CM | POA: Insufficient documentation

## 2018-06-15 DIAGNOSIS — Z7982 Long term (current) use of aspirin: Secondary | ICD-10-CM | POA: Insufficient documentation

## 2018-06-15 DIAGNOSIS — Z8673 Personal history of transient ischemic attack (TIA), and cerebral infarction without residual deficits: Secondary | ICD-10-CM | POA: Insufficient documentation

## 2018-06-15 DIAGNOSIS — S0012XA Contusion of left eyelid and periocular area, initial encounter: Secondary | ICD-10-CM

## 2018-06-15 DIAGNOSIS — F0390 Unspecified dementia without behavioral disturbance: Secondary | ICD-10-CM | POA: Insufficient documentation

## 2018-06-15 DIAGNOSIS — S0990XA Unspecified injury of head, initial encounter: Secondary | ICD-10-CM | POA: Diagnosis present

## 2018-06-15 DIAGNOSIS — Y999 Unspecified external cause status: Secondary | ICD-10-CM | POA: Diagnosis not present

## 2018-06-15 DIAGNOSIS — E039 Hypothyroidism, unspecified: Secondary | ICD-10-CM | POA: Insufficient documentation

## 2018-06-15 DIAGNOSIS — I10 Essential (primary) hypertension: Secondary | ICD-10-CM | POA: Diagnosis not present

## 2018-06-15 DIAGNOSIS — Z79899 Other long term (current) drug therapy: Secondary | ICD-10-CM | POA: Diagnosis not present

## 2018-06-15 NOTE — ED Triage Notes (Signed)
Patient from [facility] [place] fall, facility found patient prone on the floor yelling. Patient at baseline hx dementia. Two hematomas noted to forehead. No Blood thinners. No c-spine deformity or crepitus, c-collar.  154/56 56 96% RA 167 CBG

## 2018-06-15 NOTE — ED Notes (Signed)
Bed: [facility] Expected date:  Expected time:  Means of arrival:  Comments: 89 F Fall, no thinners

## 2018-06-15 NOTE — ED Provider Notes (Signed)
[facility] DEPT Provider Note   CSN: [id number] Arrival date & time: 06/15/18  2059     History   Chief Complaint Chief Complaint  Patient presents with  . Fall  . Head Injury    HPI Melissa Carroll is a 85 y.o. female by EMS from [facility] [place] for evaluation of unwitnessed fall.  Per facility, patient was found prone on the floor yelling for help.  Patient had gotten up out of bed and falling.  Patient not currently on any blood thinners.  Patient with hematoma is noted to forehead.  Daughter is at bedside who states patient has a history of dementia and sometimes does not answer questions appropriately.  Per RNs report, patient took Seroquel prior to coming to the ED.  The history is provided by a relative and the EMS personnel.   EM LEVEL 5 CAVEAT  Past Medical History:  Diagnosis Date  . Arthritis   . Cataracts, bilateral   . Closed fracture of unspecified part of lower end of humerus   . Dementia ([place])   . Hypertension   . Insomnia, unspecified   . Macular degeneration (senile) of retina, unspecified   . Osteoarthritis   . Osteoarthrosis, unspecified whether generalized or localized, unspecified site   . Pancreatitis, acute 08/14/2013  . Retinal neovascularization NOS   . Senile osteoporosis   . Subarachnoid hemorrhage following injury ([place]) 03/31/2012  . Unspecified hearing loss   . Unspecified hypothyroidism   . Urinary tract infection 08/10/2013  . Vertebral fracture 08/09/2013   T7 10%, T12 70%     Patient Active Problem List   Diagnosis Date Noted  . Hyperlipidemia 05/30/2017  . Vitamin D deficiency 02/19/2017  . Osteoporosis with pathological fracture 01/22/2017  . Vascular dementia with behavior disturbance ([place]) 01/22/2017  . Unsteady gait 01/22/2017  . History of CVA (cerebrovascular accident) 11/25/2016  . Cerebrovascular disease 07/15/2016  . Cerebral atrophy ([place]) 07/15/2016  . Meningioma ([place]) 07/15/2016  . Urine  incontinence 01/04/2016  . Hypertensive retinopathy 01/24/2015  . Posterior vitreous detachment 01/24/2015  . Pseudoaphakia 01/24/2015  . Chorioretinal scar, macular 10/04/2014  . Lower back pain 07/19/2014  . Memory deficit 01/07/2014  . GERD (gastroesophageal reflux disease) 09/14/2013  . Musculoskeletal pain 08/14/2013  . Hyperglycemia 08/14/2013  . Senile osteoporosis 08/10/2013  . Fracture, vertebra, pathologic 08/09/2013  . AMD (age-related macular degeneration), wet ([place]) 02/18/2013  . Constipation 09/11/2012  . Hypothyroidism 08/14/2012  . Osteoarthritis 08/14/2012  . Insomnia 08/14/2012  . Hypertension 04/02/2012  . History of fall 03/31/2012  . Hard of hearing 03/31/2012  . Subarachnoid hemorrhage following injury ([place]) 03/31/2012    Past Surgical History:  Procedure Laterality Date  . ABDOMINAL HYSTERECTOMY  1942  . ANKLE FRACTURE SURGERY  1960  . BLADDER SUSPENSION    . CLOSED REDUCTION WITH HUMERAL PIN INSERTION  04/01/2012   Procedure: CLOSED REDUCTION WITH HUMERAL PIN INSERTION;  Surgeon: Schuyler Amor, MD;  Location: [facility];  Service: Orthopedics;  Laterality: Left;     OB History   No obstetric history on file.      Home Medications    Prior to Admission medications   Medication Sig Start Date End Date Taking? Authorizing Provider  amLODipine (NORVASC) 10 MG tablet Take 10 mg by mouth daily.   Yes [provider]  aspirin 81 MG chewable tablet Chew 81 mg by mouth daily.   Yes [provider]  Cholecalciferol 1000 units capsule Take 2,000 Units  by mouth daily.    Yes [provider]  levothyroxine (SYNTHROID, LEVOTHROID) 88 MCG tablet Take 88 mcg by mouth daily before breakfast.   Yes [provider]  losartan (COZAAR) 100 MG tablet Take 1 tablet (100 mg total) by mouth daily. 08/28/17  Yes Blanchie Serve, MD  metoprolol (LOPRESSOR) 100 MG tablet Take 100 mg by mouth 2 (two) times daily.    Yes [provider]  Nutritional Supplements (BOOST GLUCOSE CONTROL) LIQD Take 237 mLs by mouth 2 (two) times daily.    Yes [provider]  polyethylene glycol powder (GLYCOLAX/MIRALAX) powder Take 17 g by mouth daily. Hold for loose stool 01/06/18  Yes Ngetich, Dinah C, NP  QUEtiapine (SEROQUEL) 25 MG tablet Take 25 mg by mouth at bedtime.   Yes [provider]  raloxifene (EVISTA) 60 MG tablet Take 60 mg by mouth daily.   Yes [provider]    Family History Family History  Problem Relation Age of Onset  . Hypertension Mother     Social History Social History   Tobacco Use  . Smoking status: Never Smoker  . Smokeless tobacco: Never Used  Substance Use Topics  . Alcohol use: No  . Drug use: No     Allergies   Patient has no known allergies.   Review of Systems Review of Systems  Unable to perform ROS: Mental status change     Physical Exam Updated Vital Signs BP (!) 153/56   Pulse 60   Temp 98.3 F (36.8 C) (Axillary)   Resp 16   Wt 59.3 kg   SpO2 97%   BMI 24.70 kg/m   Physical Exam Vitals signs and nursing note reviewed.  Constitutional:      Appearance: Normal appearance. She is well-developed.  HENT:     Head: Normocephalic and atraumatic.   Eyes:     General: Lids are normal.     Conjunctiva/sclera: Conjunctivae normal.     Pupils: Pupils are equal, round, and reactive to light.     Comments: PERRL  Neck:     Musculoskeletal: Full passive range of motion without pain.  Cardiovascular:     Rate and Rhythm: Normal rate and regular rhythm.     Pulses: Normal pulses.          Radial pulses are 2+ on the right side and 2+ on the left side.     Heart sounds: Normal heart sounds. No murmur. No friction rub. No gallop.   Pulmonary:     Effort: Pulmonary effort is normal.     Breath sounds: Normal breath sounds.  Abdominal:     Palpations: Abdomen is soft. Abdomen is not rigid.     Tenderness: There is no abdominal tenderness.  There is no guarding.     Comments: Abdomen is soft, non-distended, non-tender. No rigidity, No guarding. No peritoneal signs.  Musculoskeletal: Normal range of motion.  Skin:    General: Skin is warm and dry.     Capillary Refill: Capillary refill takes less than 2 seconds.  Neurological:     Mental Status: She is alert.     Comments: Open her eyes to sternal rub.  Patient not answering questions.  Psychiatric:        Speech: Speech normal.      ED Treatments / Results  Labs (all labs ordered are listed, but only abnormal results are displayed) Labs Reviewed - No data to display  EKG None  Radiology Ct Head Wo  Contrast  Result Date: 06/15/2018 CLINICAL DATA:  Unwitnessed fall, found down. Forehead hematoma. History of dementia and hypertension. EXAM: CT HEAD WITHOUT CONTRAST CT CERVICAL SPINE WITHOUT CONTRAST TECHNIQUE: Multidetector CT imaging of the head and cervical spine was performed following the standard protocol without intravenous contrast. Multiplanar CT image reconstructions of the cervical spine were also generated. COMPARISON:  CT HEAD and cervical spine April 02, 2017. FINDINGS: CT HEAD FINDINGS BRAIN: No intraparenchymal hemorrhage, mass effect nor midline shift. Moderate to severe parenchymal brain volume loss for age. No hydrocephalus. Confluent supratentorial white matter hypodensities. Old small bilateral cerebellar infarcts, new on the RIGHT. No acute large vascular territory infarcts. No abnormal extra-axial fluid collections. Basal cisterns are patent. Subcentimeter RIGHT frontal meningioma is similar, no associated mass effect. VASCULAR: Moderate calcific atherosclerosis of the carotid siphons. SKULL: No skull fracture. Osteopenia. Moderate LEFT frontal scalp hematoma without subcutaneous gas or radiopaque foreign bodies. SINUSES/ORBITS: Paranasal sinuses are well aerated. Mastoid air cells are well aerated. Soft tissue effaces the LEFT external auditory canal,  likely reflecting cerumen. The included ocular globes and orbital contents are non-suspicious. Status post bilateral ocular lens implants. OTHER: None. CT CERVICAL SPINE FINDINGS ALIGNMENT: Maintained lordosis. No malalignment. SKULL BASE AND VERTEBRAE: Cervical vertebral bodies and posterior elements are intact. Severe C4-5 and C5-6 disc height loss with endplate spurring compatible with degenerative discs, moderate at C6-7. No destructive bony lesions. C1-2 articulation maintained with moderate arthropathy. Calcified pannus about the odontoid process seen with CPPD. T2 superior endplate Schmorl's node. SOFT TISSUES AND SPINAL CANAL: Nonacute. Moderate carotid bifurcation calcific atherosclerosis severe calcific atherosclerosis aortic arch. DISC LEVELS: No high-grade osseous canal stenosis. Severe bilateral C5-6 neural foraminal narrowing. UPPER CHEST: Biapical pleuroparenchymal scarring. OTHER: None. IMPRESSION: CT HEAD: 1. No acute intracranial process.  LEFT frontal scalp hematoma. 2. Stable moderate to severe parenchymal brain volume loss. 3. Moderate to severe chronic small vessel ischemic changes and old small cerebellar infarcts. CT CERVICAL SPINE: 1. No fracture or malalignment. 2. Severe C5-6 neural foraminal narrowing. Aortic Atherosclerosis (ICD10-I70.0). Electronically Signed   By: Elon Alas M.D.   On: 06/15/2018 23:39   Ct Cervical Spine Wo Contrast  Result Date: 06/15/2018 CLINICAL DATA:  Patient from [facility] [place] fall, facility found patient prone on the floor yelling. Patient at baseline hx dementia. Two hematomas noted to forehead. No Blood thinners. No c-spine deformity or crepitus, c-collar. EXAM: CT HEAD WITHOUT CONTRAST CT CERVICAL SPINE WITHOUT CONTRAST TECHNIQUE: Multidetector CT imaging of the head and cervical spine was performed following the standard protocol without intravenous contrast. Multiplanar CT image reconstructions of the cervical spine were also  generated. COMPARISON:  04/02/2017 FINDINGS: CT HEAD FINDINGS Brain: No evidence of acute infarction, hemorrhage, extra-axial collection, ventriculomegaly, or mass effect. Generalized cerebral atrophy. Periventricular white matter low attenuation likely secondary to microangiopathy. Vascular: Cerebrovascular atherosclerotic calcifications are noted. Skull: Negative for fracture or focal lesion. Sinuses/Orbits: Visualized portions of the orbits are unremarkable. Visualized portions of the paranasal sinuses and mastoid air cells are unremarkable. Other: None. CT CERVICAL SPINE FINDINGS Alignment: Normal. Skull base and vertebrae: No acute fracture. No primary bone lesion or focal pathologic process. Soft tissues and spinal canal: No prevertebral fluid or swelling. No visible canal hematoma. Disc levels: Degenerative disc disease with disc height loss at C4-5, C5-6 and C6-7. Mild pannus formation along the posterior aspect of the dens. Mild broad-based disc osteophyte complexes at C4-5, C5-6 and C6-7 with bilateral facet arthropathy. Severe bilateral foraminal stenosis at C5-6. Upper chest:  Biapical scarring. Other: No fluid collection or hematoma. Thoracic aortic atherosclerosis. Bilateral carotid artery atherosclerosis. IMPRESSION: 1. No acute intracranial pathology. 2.  No acute osseous injury of the cervical spine. 3. Cervical spine spondylosis as described above. 4. Aortic Atherosclerosis (ICD10-I70.0). Carotid artery atherosclerosis. Electronically Signed   By: Kathreen Devoid   On: 06/15/2018 23:30    Procedures Procedures (including critical care time)  Medications Ordered in ED Medications - No data to display   Initial Impression / Assessment and Plan / ED Course  I have reviewed the triage vital signs and the nursing notes.  Pertinent labs & imaging results that were available during my care of the patient were reviewed by me and considered in my medical decision making (see chart for details).      85 year old female with past medical history of dementia, arthritis who presents for evaluation of head injury.  Patient from [facility] [place] who found patient lying prone on the floor.  Daughter reports baseline history of dementia.  Patient with hematoma noted to head.  Vital signs are stable.  Patient grimaces when I touch her head.  She will grimace when I try and open her eyes.  She will slightly wake up with sternal rub but does not answer any questions.  Daughter does report she is more vocal usually.  Plan for CT scan of head.  Patient signed out to Janetta Hora, PA-C with CT head pending.   Final Clinical Impressions(s) / ED Diagnoses   Final diagnoses:  Contusion of left periocular region, initial encounter  Fall, initial encounter    ED Discharge Orders    None       Desma Mcgregor 06/16/18 1545    Quintella Reichert, MD 06/19/18 1022

## 2018-06-16 ENCOUNTER — Encounter: Payer: Self-pay | Admitting: Nurse Practitioner

## 2018-06-16 ENCOUNTER — Non-Acute Institutional Stay (SKILLED_NURSING_FACILITY): Payer: Medicare Other | Admitting: Nurse Practitioner

## 2018-06-16 DIAGNOSIS — S0012XD Contusion of left eyelid and periocular area, subsequent encounter: Secondary | ICD-10-CM | POA: Diagnosis not present

## 2018-06-16 DIAGNOSIS — I10 Essential (primary) hypertension: Secondary | ICD-10-CM

## 2018-06-16 DIAGNOSIS — R2681 Unsteadiness on feet: Secondary | ICD-10-CM | POA: Diagnosis not present

## 2018-06-16 DIAGNOSIS — F0151 Vascular dementia with behavioral disturbance: Secondary | ICD-10-CM

## 2018-06-16 DIAGNOSIS — R413 Other amnesia: Secondary | ICD-10-CM | POA: Diagnosis not present

## 2018-06-16 DIAGNOSIS — S0093XA Contusion of unspecified part of head, initial encounter: Secondary | ICD-10-CM | POA: Insufficient documentation

## 2018-06-16 DIAGNOSIS — Z9181 History of falling: Secondary | ICD-10-CM | POA: Diagnosis not present

## 2018-06-16 DIAGNOSIS — F01518 Vascular dementia, unspecified severity, with other behavioral disturbance: Secondary | ICD-10-CM

## 2018-06-16 DIAGNOSIS — E039 Hypothyroidism, unspecified: Secondary | ICD-10-CM

## 2018-06-16 DIAGNOSIS — K5901 Slow transit constipation: Secondary | ICD-10-CM

## 2018-06-16 NOTE — Assessment & Plan Note (Signed)
Blood pressure is controlled, continue Losartan 100mg  qd, Amlodipine 10mg  qd, Metoprolol 100mg  bid.

## 2018-06-16 NOTE — Assessment & Plan Note (Signed)
last fall 06/15/18 resulted in contusion of left periocular region, s/p ED evaluation, CT cervical spine/head showed no acute fractures or intracranial process. Will continue to observe

## 2018-06-16 NOTE — Assessment & Plan Note (Signed)
Stable, continue MiraLax qd,

## 2018-06-16 NOTE — Assessment & Plan Note (Signed)
Stable, continue Levothyroxine 60mcg qd, last TSH wnl 3.45 05/21/18

## 2018-06-16 NOTE — ED Notes (Signed)
[facility] contacted for transport 

## 2018-06-16 NOTE — Progress Notes (Signed)
Location:  [facility] Room Number: 2 Place of Service:  SNF (31) Provider:  Marlana Latus  NP  Virgie Dad, MD  Patient Care Team: Virgie Dad, MD as PCP - General (Internal Medicine) Voula Waln X, NP as Nurse Practitioner (Nurse Practitioner) Charlotte Crumb, MD as Consulting Physician (Orthopedic Surgery) Regal, Tamala Fothergill, DPM as Consulting Physician (Podiatry) Gaynelle Arabian, MD as Consulting Physician (Orthopedic Surgery) Zebedee Iba., MD as Referring Physician (Ophthalmology) Newman Pies, MD as Consulting Physician (Neurosurgery) Melina Modena, [facility] (Skilled Nursing and [place]) Carisha Kantor X, NP as Nurse Practitioner (Internal Medicine)  Extended Emergency Contact Information Primary Emergency Contact: Welby,[name] Address: [address], [place] [address] Johnnette Litter of [place] Phone: [phone] Mobile Phone: [phone] Relation: Daughter Secondary Emergency Contact: [place] of [place] Phone: [phone] Relation: Grandson  Code Status:  DNR Goals of care: Advanced Directive information Advanced Directives 06/15/2018  Does Patient Have a Medical Advance Directive? Yes  Type of Advance Directive Out of facility DNR (pink MOST or yellow form)  Does patient want to make changes to medical advance directive? -  Copy of [place] in Chart? -  Pre-existing out of facility DNR order (yellow form or pink MOST form) Pink MOST form placed in chart (order not valid for inpatient use);Yellow form placed in chart (order not valid for inpatient use)     Chief Complaint  Patient presents with  . Medical Management of Chronic Issues    HPI:  Pt is a 85 y.o. female seen today for medical management of chronic diseases.     The patient has history of dementia, resides in SNF [facility] for safety and care assistance. She is lack of safety awareness, her  gait is unsteady. Risk of falling, last fall 06/15/18 resulted in contusion of left periocular region, s/p ED evaluation, CT cervical spine/head showed no acute fractures or intracranial process. Her mood is stable on Quetiapine 25mg  qd. No constipation while on MiraLax qd. HTN, blood pressure is controlled, on Metoprolol 100mg  bid, Losartan 100mg  qd, Amlodipine 10mg  qd.  Hypothyroidism, on Levothyroxine 30mcg qd, last TSH 3.45 05/21/18.    Past Medical History:  Diagnosis Date  . Arthritis   . Cataracts, bilateral   . Closed fracture of unspecified part of lower end of humerus   . Dementia ([place])   . Hypertension   . Insomnia, unspecified   . Macular degeneration (senile) of retina, unspecified   . Osteoarthritis   . Osteoarthrosis, unspecified whether generalized or localized, unspecified site   . Pancreatitis, acute 08/14/2013  . Retinal neovascularization NOS   . Senile osteoporosis   . Subarachnoid hemorrhage following injury ([place]) 03/31/2012  . Unspecified hearing loss   . Unspecified hypothyroidism   . Urinary tract infection 08/10/2013  . Vertebral fracture 08/09/2013   T7 10%, T12 70%    Past Surgical History:  Procedure Laterality Date  . ABDOMINAL HYSTERECTOMY  1942  . ANKLE FRACTURE SURGERY  1960  . BLADDER SUSPENSION    . CLOSED REDUCTION WITH HUMERAL PIN INSERTION  04/01/2012   Procedure: CLOSED REDUCTION WITH HUMERAL PIN INSERTION;  Surgeon: Schuyler Amor, MD;  Location: [facility];  Service: Orthopedics;  Laterality: Left;    No Known Allergies  Outpatient Encounter Medications as of 06/16/2018  Medication Sig  . amLODipine (NORVASC) 10 MG tablet Take 10 mg by mouth daily.  Marland Kitchen  aspirin 81 MG chewable tablet Chew 81 mg by mouth daily.  . Cholecalciferol 1000 units capsule Take 2,000 Units by mouth daily.   Marland Kitchen levothyroxine (SYNTHROID, LEVOTHROID) 88 MCG tablet Take 88 mcg by mouth daily before breakfast.  . losartan (COZAAR) 100 MG tablet Take 1 tablet (100 mg total) by  mouth daily.  . metoprolol (LOPRESSOR) 100 MG tablet Take 100 mg by mouth 2 (two) times daily.   . Nutritional Supplements (BOOST GLUCOSE CONTROL) LIQD Take 237 mLs by mouth 2 (two) times daily.   . polyethylene glycol powder (GLYCOLAX/MIRALAX) powder Take 17 g by mouth daily. Hold for loose stool  . QUEtiapine (SEROQUEL) 25 MG tablet Take 25 mg by mouth at bedtime.  . raloxifene (EVISTA) 60 MG tablet Take 60 mg by mouth daily.   No facility-administered encounter medications on file as of 06/16/2018.    ROS was provided with assistance of staff.  Review of Systems  Constitutional: Negative for activity change, appetite change, chills, diaphoresis, fatigue, fever and unexpected weight change.  HENT: Positive for hearing loss. Negative for congestion, ear discharge, ear pain, facial swelling, nosebleeds, rhinorrhea, sinus pressure, sinus pain and voice change.   Eyes: Positive for visual disturbance.       Low vision  Respiratory: Negative for cough, shortness of breath and wheezing.   Cardiovascular: Positive for leg swelling. Negative for chest pain and palpitations.  Gastrointestinal: Negative for abdominal distention, abdominal pain, constipation, diarrhea, nausea and vomiting.  Genitourinary: Negative for difficulty urinating, dysuria and urgency.  Musculoskeletal: Positive for back pain and gait problem.  Skin: Positive for wound.       Left periorbital contusion. Left wrist skin tear.   Neurological: Negative for dizziness, facial asymmetry, speech difficulty, weakness and headaches.       Dementia  Psychiatric/Behavioral: Positive for confusion. Negative for agitation, behavioral problems, hallucinations and sleep disturbance. The patient is not nervous/anxious.     Immunization History  Administered Date(s) Administered  . Influenza-Unspecified 03/02/2013, 02/15/2014, 02/16/2015, 02/22/2016, 02/20/2017, 02/23/2018  . PPD Test 03/23/2012  . Pneumococcal Conjugate-13 12/17/2016  .  Pneumococcal Polysaccharide-23 09/22/2002  . Td 11/17/2002  . Tdap 12/16/2016   Pertinent  Health Maintenance Due  Topic Date Due  . INFLUENZA VACCINE  Completed  . DEXA SCAN  Completed  . PNA vac Low Risk Adult  Completed   Fall Risk  12/24/2017 12/13/2016 02/07/2015 02/22/2014 02/22/2014  Falls in the past year? Yes No No Yes No  Number falls in past yr: 1 - - 1 -  Injury with Fall? Yes - - - -  Risk for fall due to : - - - History of fall(s) -   Functional Status Survey:    Vitals:   06/16/18 1340  BP: 136/60  Pulse: 70  Resp: 20  Temp: 97.7 F (36.5 C)  SpO2: 95%  Weight: 130 lb 11.2 oz (59.3 kg)  Height: 5\' 1"  (1.549 m)   Body mass index is 24.7 kg/m. Physical Exam Constitutional:      General: She is not in acute distress.    Appearance: Normal appearance. She is normal weight. She is not ill-appearing, toxic-appearing or diaphoretic.  HENT:     Head: Normocephalic.     Comments: Left periorbital contusion/bruise/swelling.     Nose: Nose normal. No congestion or rhinorrhea.     Mouth/Throat:     Mouth: Mucous membranes are moist.  Eyes:     Extraocular Movements: Extraocular movements intact.     Conjunctiva/sclera: Conjunctivae  normal.     Pupils: Pupils are equal, round, and reactive to light.  Neck:     Musculoskeletal: Normal range of motion and neck supple. No neck rigidity or muscular tenderness.  Cardiovascular:     Rate and Rhythm: Normal rate and regular rhythm.     Heart sounds: No murmur.  Pulmonary:     Effort: Pulmonary effort is normal.     Breath sounds: No wheezing, rhonchi or rales.  Abdominal:     General: There is no distension.     Palpations: Abdomen is soft.     Tenderness: There is no abdominal tenderness. There is no guarding or rebound.  Musculoskeletal:        General: No tenderness.     Right lower leg: Edema present.     Left lower leg: Edema present.     Comments: Trace edema BLE. Ambulates with walker.   Lymphadenopathy:       Cervical: No cervical adenopathy.  Skin:    General: Skin is warm and dry.     Findings: Bruising present.     Comments: Left peri orbital contusion/burise/swelling.   Neurological:     General: No focal deficit present.     Mental Status: She is alert. Mental status is at baseline.     Cranial Nerves: No cranial nerve deficit.     Motor: No weakness.     Coordination: Coordination normal.     Gait: Gait abnormal.     Comments: Oriented to person and her room on unit.   Psychiatric:        Mood and Affect: Mood normal.        Behavior: Behavior normal.     Labs reviewed: Recent Labs    09/01/17 01/19/18 05/21/18  NA 135* 139 139  K 4.5 4.1 4.6  CL 100  --  105  CO2 27  --  23  BUN 24* 24* 28*  CREATININE 0.7 0.66 0.7  CALCIUM 8.8 8.8 9.1   Recent Labs    09/01/17 01/19/18 05/21/18  AST 14 11 14   ALT 11 10 11   ALKPHOS 54 37 51  BILITOT  --  0.6  --   PROT 6.0 5.5 6.4  ALBUMIN 3.1 3 3.5   Recent Labs    09/01/17 05/21/18  WBC 9.6 11.8  HGB 12.3 12.9  HCT 35* 38  PLT 243 223   Lab Results  Component Value Date   TSH 3.45 05/21/2018   Lab Results  Component Value Date   HGBA1C 6.7 07/18/2016   Lab Results  Component Value Date   CHOL 146 01/23/2017   HDL 39 01/23/2017   LDLCALC 76 01/23/2017   TRIG 214 (A) 01/23/2017    Significant Diagnostic Results in last 30 days:  Ct Head Wo Contrast  Result Date: 06/15/2018 CLINICAL DATA:  Unwitnessed fall, found down. Forehead hematoma. History of dementia and hypertension. EXAM: CT HEAD WITHOUT CONTRAST CT CERVICAL SPINE WITHOUT CONTRAST TECHNIQUE: Multidetector CT imaging of the head and cervical spine was performed following the standard protocol without intravenous contrast. Multiplanar CT image reconstructions of the cervical spine were also generated. COMPARISON:  CT HEAD and cervical spine April 02, 2017. FINDINGS: CT HEAD FINDINGS BRAIN: No intraparenchymal hemorrhage, mass effect nor midline shift.  Moderate to severe parenchymal brain volume loss for age. No hydrocephalus. Confluent supratentorial white matter hypodensities. Old small bilateral cerebellar infarcts, new on the RIGHT. No acute large vascular territory infarcts. No abnormal extra-axial fluid collections. Basal  cisterns are patent. Subcentimeter RIGHT frontal meningioma is similar, no associated mass effect. VASCULAR: Moderate calcific atherosclerosis of the carotid siphons. SKULL: No skull fracture. Osteopenia. Moderate LEFT frontal scalp hematoma without subcutaneous gas or radiopaque foreign bodies. SINUSES/ORBITS: Paranasal sinuses are well aerated. Mastoid air cells are well aerated. Soft tissue effaces the LEFT external auditory canal, likely reflecting cerumen. The included ocular globes and orbital contents are non-suspicious. Status post bilateral ocular lens implants. OTHER: None. CT CERVICAL SPINE FINDINGS ALIGNMENT: Maintained lordosis. No malalignment. SKULL BASE AND VERTEBRAE: Cervical vertebral bodies and posterior elements are intact. Severe C4-5 and C5-6 disc height loss with endplate spurring compatible with degenerative discs, moderate at C6-7. No destructive bony lesions. C1-2 articulation maintained with moderate arthropathy. Calcified pannus about the odontoid process seen with CPPD. T2 superior endplate Schmorl's node. SOFT TISSUES AND SPINAL CANAL: Nonacute. Moderate carotid bifurcation calcific atherosclerosis severe calcific atherosclerosis aortic arch. DISC LEVELS: No high-grade osseous canal stenosis. Severe bilateral C5-6 neural foraminal narrowing. UPPER CHEST: Biapical pleuroparenchymal scarring. OTHER: None. IMPRESSION: CT HEAD: 1. No acute intracranial process.  LEFT frontal scalp hematoma. 2. Stable moderate to severe parenchymal brain volume loss. 3. Moderate to severe chronic small vessel ischemic changes and old small cerebellar infarcts. CT CERVICAL SPINE: 1. No fracture or malalignment. 2. Severe C5-6 neural  foraminal narrowing. Aortic Atherosclerosis (ICD10-I70.0). Electronically Signed   By: Elon Alas M.D.   On: 06/15/2018 23:39   Ct Cervical Spine Wo Contrast  Result Date: 06/15/2018 CLINICAL DATA:  [facility] from [facility] [place] fall, facility found patient prone on the floor yelling. Patient at baseline hx dementia. Two hematomas noted to forehead. No Blood thinners. No c-spine deformity or crepitus, c-collar. EXAM: CT HEAD WITHOUT CONTRAST CT CERVICAL SPINE WITHOUT CONTRAST TECHNIQUE: Multidetector CT imaging of the head and cervical spine was performed following the standard protocol without intravenous contrast. Multiplanar CT image reconstructions of the cervical spine were also generated. COMPARISON:  04/02/2017 FINDINGS: CT HEAD FINDINGS Brain: No evidence of acute infarction, hemorrhage, extra-axial collection, ventriculomegaly, or mass effect. Generalized cerebral atrophy. Periventricular white matter low attenuation likely secondary to microangiopathy. Vascular: Cerebrovascular atherosclerotic calcifications are noted. Skull: Negative for fracture or focal lesion. Sinuses/Orbits: Visualized portions of the orbits are unremarkable. Visualized portions of the paranasal sinuses and mastoid air cells are unremarkable. Other: None. CT CERVICAL SPINE FINDINGS Alignment: Normal. Skull base and vertebrae: No acute fracture. No primary bone lesion or focal pathologic process. Soft tissues and spinal canal: No prevertebral fluid or swelling. No visible canal hematoma. Disc levels: Degenerative disc disease with disc height loss at C4-5, C5-6 and C6-7. Mild pannus formation along the posterior aspect of the dens. Mild broad-based disc osteophyte complexes at C4-5, C5-6 and C6-7 with bilateral facet arthropathy. Severe bilateral foraminal stenosis at C5-6. Upper chest: Biapical scarring. Other: No fluid collection or hematoma. Thoracic aortic atherosclerosis. Bilateral carotid artery  atherosclerosis. IMPRESSION: 1. No acute intracranial pathology. 2.  No acute osseous injury of the cervical spine. 3. Cervical spine spondylosis as described above. 4. Aortic Atherosclerosis (ICD10-I70.0). Carotid artery atherosclerosis. Electronically Signed   By: Kathreen Devoid   On: 06/15/2018 23:30    Assessment/Plan Contusion of head last fall 06/15/18 resulted in contusion of left periocular region, s/p ED evaluation, CT cervical spine/head showed no acute fractures or intracranial process. Will continue to observe  History of fall Lack of safety awareness and gait abnormality are contributory, will need close supervision for safety.   Unsteady gait Continue to ambulates with  walker with close supervision.   Memory deficit Continue SNF [facility] for safety and care assistance.   Constipation Stable, continue MiraLax qd,   Vascular dementia with behavior disturbance ([place]) Her mood is stable, no psychosis presently, continue Quetiapine 25mg  qd.   Hypothyroidism Stable, continue Levothyroxine 31mcg qd, last TSH wnl 3.45 05/21/18  Hypertension Blood pressure is controlled, continue Losartan 100mg  qd, Amlodipine 10mg  qd, Metoprolol 100mg  bid.   '   Family/ staff Communication: plan of care reviewed with the patient and charge nurse.   Labs/tests ordered:  none  Time spend 25 minutes.

## 2018-06-16 NOTE — Assessment & Plan Note (Signed)
Her mood is stable, no psychosis presently, continue Quetiapine 25mg  qd.

## 2018-06-16 NOTE — Assessment & Plan Note (Signed)
Lack of safety awareness and gait abnormality are contributory, will need close supervision for safety.

## 2018-06-16 NOTE — Assessment & Plan Note (Signed)
Continue SNF [facility] for safety and care assistance.  

## 2018-06-16 NOTE — Assessment & Plan Note (Signed)
Continue to ambulates with walker with close supervision.

## 2018-06-16 NOTE — ED Notes (Signed)
Patient unable to sign for discharge 

## 2018-07-31 ENCOUNTER — Encounter: Payer: Self-pay | Admitting: Internal Medicine

## 2018-07-31 ENCOUNTER — Non-Acute Institutional Stay (SKILLED_NURSING_FACILITY): Payer: Medicare Other | Admitting: Internal Medicine

## 2018-07-31 DIAGNOSIS — I10 Essential (primary) hypertension: Secondary | ICD-10-CM | POA: Diagnosis not present

## 2018-07-31 DIAGNOSIS — E039 Hypothyroidism, unspecified: Secondary | ICD-10-CM

## 2018-07-31 DIAGNOSIS — M810 Age-related osteoporosis without current pathological fracture: Secondary | ICD-10-CM | POA: Diagnosis not present

## 2018-07-31 DIAGNOSIS — F0151 Vascular dementia with behavioral disturbance: Secondary | ICD-10-CM

## 2018-07-31 DIAGNOSIS — F01518 Vascular dementia, unspecified severity, with other behavioral disturbance: Secondary | ICD-10-CM

## 2018-07-31 NOTE — Progress Notes (Signed)
Location:  [facility] Room Number: 2 Place of Service:  SNF (31) Provider:Mouna Yager Anajli L,MD   Virgie Dad, MD  Patient Care Team: Virgie Dad, MD as PCP - General (Internal Medicine) Mast, Man X, NP as Nurse Practitioner (Nurse Practitioner) Charlotte Crumb, MD as Consulting Physician (Orthopedic Surgery) Regal, Tamala Fothergill, DPM as Consulting Physician (Podiatry) Gaynelle Arabian, MD as Consulting Physician (Orthopedic Surgery) Zebedee Iba., MD as Referring Physician (Ophthalmology) Newman Pies, MD as Consulting Physician (Neurosurgery) Melina Modena, [facility] (Skilled Nursing and [place]) Mast, Man X, NP as Nurse Practitioner (Internal Medicine)  Extended Emergency Contact Information Primary Emergency Contact: Wilmore,[name] Address: [place],  [address] [address] of [place] Phone: [phone] Mobile Phone: [phone] Relation: Daughter Secondary Emergency Contact: Jennet [place] of [place] Phone: [phone] Relation: Grandson  Code Status:DNR  Goals of care: Advanced Directive information Advanced Directives 07/31/2018  Does Patient Have a Medical Advance Directive? Yes  Type of Paramedic of [place];Out of facility DNR (pink MOST or yellow form)  Does patient want to make changes to medical advance directive? No - Patient declined  Copy of [place] in Chart? Yes - validated most recent copy scanned in chart (See row information)  Pre-existing out of facility DNR order (yellow form or pink MOST form) Pink MOST form placed in chart (order not valid for inpatient use)     Chief Complaint  Patient presents with   Medical Management of Chronic Issues    routine visit     HPI:  Pt is a 85 y.o. female seen today for medical management of chronic diseases. Patient has h/o Hypertension, Hypothyroidism, Dementia with  Behavior issues and Osteoporosis Patient has Advanced dementia . Walks with the walker but stays Fall risk Has skin tears. Also Resists care sometimes ans has some other behavior issues She did not have any complains today with me  Her weight is stable; No New complains per nursing staff.     Past Medical History:  Diagnosis Date   Arthritis    Cataracts, bilateral    Closed fracture of unspecified part of lower end of humerus    Dementia ([place])    Hypertension    Insomnia, unspecified    Macular degeneration (senile) of retina, unspecified    Osteoarthritis    Osteoarthrosis, unspecified whether generalized or localized, unspecified site    Pancreatitis, acute 08/14/2013   Retinal neovascularization NOS    Senile osteoporosis    Subarachnoid hemorrhage following injury ([place]) 03/31/2012   Unspecified hearing loss    Unspecified hypothyroidism    Urinary tract infection 08/10/2013   Vertebral fracture 08/09/2013   T7 10%, T12 70%    Past Surgical History:  Procedure Laterality Date   ABDOMINAL HYSTERECTOMY  1942   ANKLE FRACTURE SURGERY  1960   BLADDER SUSPENSION     CLOSED REDUCTION WITH HUMERAL PIN INSERTION  04/01/2012   Procedure: CLOSED REDUCTION WITH HUMERAL PIN INSERTION;  Surgeon: Schuyler Amor, MD;  Location: [facility];  Service: Orthopedics;  Laterality: Left;    No Known Allergies  Outpatient Encounter Medications as of 07/31/2018  Medication Sig   amLODipine (NORVASC) 10 MG tablet Take 10 mg by mouth daily.   aspirin 81 MG chewable tablet Chew 81 mg by mouth daily.   Cholecalciferol 1000 units capsule Take 2,000 Units by mouth daily.  ketoconazole (NIZORAL) 2 % shampoo Apply 1 application topically once a week. Send shampoo with daughter to beauty shop with resident on Wednesday   levothyroxine (SYNTHROID, LEVOTHROID) 88 MCG tablet Take 88 mcg by mouth daily before breakfast.   losartan (COZAAR) 100 MG tablet Take 1 tablet (100 mg  total) by mouth daily.   metoprolol (LOPRESSOR) 100 MG tablet Take 100 mg by mouth 2 (two) times daily.    Nutritional Supplements (BOOST GLUCOSE CONTROL) LIQD Take 237 mLs by mouth 2 (two) times daily.    polyethylene glycol powder (GLYCOLAX/MIRALAX) powder Take 17 g by mouth daily. Hold for loose stool   QUEtiapine (SEROQUEL) 25 MG tablet Take 25 mg by mouth at bedtime.   raloxifene (EVISTA) 60 MG tablet Take 60 mg by mouth daily.   No facility-administered encounter medications on file as of 07/31/2018.     Review of Systems  Unable to perform ROS: Dementia    Immunization History  Administered Date(s) Administered   Influenza-Unspecified 03/02/2013, 02/15/2014, 02/16/2015, 02/22/2016, 02/20/2017, 02/23/2018   PPD Test 03/23/2012   Pneumococcal Conjugate-13 12/17/2016   Pneumococcal Polysaccharide-23 09/22/2002   Td 11/17/2002   Tdap 12/16/2016   Pertinent  Health Maintenance Due  Topic Date Due   INFLUENZA VACCINE  Completed   DEXA SCAN  Completed   PNA vac Low Risk Adult  Completed   Fall Risk  12/24/2017 12/13/2016 02/07/2015 02/22/2014 02/22/2014  Falls in the past year? Yes No No Yes No  Number falls in past yr: 1 - - 1 -  Injury with Fall? Yes - - - -  Risk for fall due to : - - - History of fall(s) -   Functional Status Survey:    Vitals:   07/31/18 0842  BP: (!) 106/56  Pulse: 64  Resp: 18  Temp: 97.8 F (36.6 C)  SpO2: 97%  Weight: 133 lb 8 oz (60.6 kg)  Height: 5\' 1"  (1.549 m)   Body mass index is 25.22 kg/m. Physical Exam Vitals signs reviewed.  HENT:     Head: Normocephalic.     Nose: Nose normal.     Mouth/Throat:     Mouth: Mucous membranes are moist.     Pharynx: Oropharynx is clear.  Eyes:     Pupils: Pupils are equal, round, and reactive to light.  Neck:     Musculoskeletal: Neck supple.  Cardiovascular:     Rate and Rhythm: Normal rate and regular rhythm.     Pulses: Normal pulses.     Heart sounds: Normal heart sounds.    Pulmonary:     Effort: Pulmonary effort is normal. No respiratory distress.     Breath sounds: Normal breath sounds. No wheezing or rales.  Abdominal:     General: Abdomen is flat. Bowel sounds are normal.     Palpations: Abdomen is soft.  Musculoskeletal:        General: No swelling.  Neurological:     General: No focal deficit present.     Mental Status: She is alert.  Psychiatric:        Mood and Affect: Mood normal.        Thought Content: Thought content normal.     Labs reviewed: Recent Labs    09/01/17 01/19/18 05/21/18  NA 135* 139 139  K 4.5 4.1 4.6  CL 100  --  105  CO2 27  --  23  BUN 24* 24* 28*  CREATININE 0.7 0.66 0.7  CALCIUM 8.8 8.8 9.1  Recent Labs    09/01/17 01/19/18 05/21/18  AST 14 11 14   ALT 11 10 11   ALKPHOS 54 37 51  BILITOT  --  0.6  --   PROT 6.0 5.5 6.4  ALBUMIN 3.1 3 3.5   Recent Labs    09/01/17 05/21/18  WBC 9.6 11.8  HGB 12.3 12.9  HCT 35* 38  PLT 243 223   Lab Results  Component Value Date   TSH 3.45 05/21/2018   Lab Results  Component Value Date   HGBA1C 6.7 07/18/2016   Lab Results  Component Value Date   CHOL 146 01/23/2017   HDL 39 01/23/2017   LDLCALC 76 01/23/2017   TRIG 214 (A) 01/23/2017    Significant Diagnostic Results in last 30 days:  No results found.  Assessment/Plan Essential hypertension Controlled on Metoprolol, Cozaar and Norvasc  Acquired hypothyroidism TSH normal in 01/20 Continue Same dose  Vascular dementia with behavior disturbance  Continue on Seroquel Per Nurses GDR not recommended right now Continue Supportive care  Senile osteoporosis On Raloxifene     Family/ staff Communication:   Labs/tests ordered:     Total time spent in this patient care encounter was  25_  minutes; greater than 50% of the visit spent counseling patient and staff, reviewing records , Labs and coordinating care for problems addressed at this encounter.

## 2018-08-17 ENCOUNTER — Non-Acute Institutional Stay (SKILLED_NURSING_FACILITY): Payer: Medicare Other | Admitting: Family

## 2018-08-17 ENCOUNTER — Encounter: Payer: Self-pay | Admitting: Family

## 2018-08-17 DIAGNOSIS — F0151 Vascular dementia with behavioral disturbance: Secondary | ICD-10-CM

## 2018-08-17 DIAGNOSIS — S51819A Laceration without foreign body of unspecified forearm, initial encounter: Secondary | ICD-10-CM

## 2018-08-17 DIAGNOSIS — I10 Essential (primary) hypertension: Secondary | ICD-10-CM

## 2018-08-17 DIAGNOSIS — S81812A Laceration without foreign body, left lower leg, initial encounter: Secondary | ICD-10-CM | POA: Diagnosis not present

## 2018-08-17 DIAGNOSIS — F01518 Vascular dementia, unspecified severity, with other behavioral disturbance: Secondary | ICD-10-CM

## 2018-08-17 DIAGNOSIS — E039 Hypothyroidism, unspecified: Secondary | ICD-10-CM

## 2018-08-17 NOTE — Progress Notes (Signed)
Location:  [facility] Room Number: 2 Place of Service:  SNF (31) Provider:Tavin Vernet,NP   Virgie Dad, MD  Patient Care Team: Virgie Dad, MD as PCP - General (Internal Medicine) Mast, Man X, NP as Nurse Practitioner (Nurse Practitioner) Charlotte Crumb, MD as Consulting Physician (Orthopedic Surgery) Regal, Tamala Fothergill, DPM as Consulting Physician (Podiatry) Gaynelle Arabian, MD as Consulting Physician (Orthopedic Surgery) Zebedee Iba., MD as Referring Physician (Ophthalmology) Newman Pies, MD as Consulting Physician (Neurosurgery) Melina Modena, [facility] (Skilled Nursing and [place]) Mast, Man X, NP as Nurse Practitioner (Internal Medicine)  Extended Emergency Contact Information Primary Emergency Contact: Quayle,[name] Address: [place], [place] [address] Johnnette Litter of [place] Phone: [phone] Mobile Phone: [phone] Relation: Daughter Secondary Emergency Contact: [place] of [place] Phone: [phone] Relation: Grandson  Code Status: DNR Goals of care: Advanced Directive information Advanced Directives 08/17/2018  Does Patient Have a Medical Advance Directive? Yes  Type of Paramedic of [place];Out of facility DNR (pink MOST or yellow form)  Does patient want to make changes to medical advance directive? No - Guardian declined  Copy of [place] in Chart? Yes - validated most recent copy scanned in chart (See row information)  Pre-existing out of facility DNR order (yellow form or pink MOST form) Yellow form placed in chart (order not valid for inpatient use);Pink MOST form placed in chart (order not valid for inpatient use)     Chief Complaint  Patient presents with  . Medical Management of Chronic Issues    routine visit     HPI:  Pt is a 85 y.o. female seen today for medical management of chronic  diseases.she is seen in her room today lying in the bed.she is HOH unable to provide HPI information.Facility Nurse reports patient resist care at time but usually response well when redirected or allowed to calm down the re-approached later.Nurse states patient sustained Skin tear to left leg when CNA was trying to assist her to the bathroom patient was kicking.skin tear was cleanse with saline and edges approximated and steri-strips applied and covered with Kerlix.No fever or chills reported.Patient refused for a recent blood pressure to be checked.No recent fall episode or weight changes reported.     Past Medical History:  Diagnosis Date  . Arthritis   . Cataracts, bilateral   . Closed fracture of unspecified part of lower end of humerus   . Dementia ([place])   . Hypertension   . Insomnia, unspecified   . Macular degeneration (senile) of retina, unspecified   . Osteoarthritis   . Osteoarthrosis, unspecified whether generalized or localized, unspecified site   . Pancreatitis, acute 08/14/2013  . Retinal neovascularization NOS   . Senile osteoporosis   . Subarachnoid hemorrhage following injury ([place]) 03/31/2012  . Unspecified hearing loss   . Unspecified hypothyroidism   . Urinary tract infection 08/10/2013  . Vertebral fracture 08/09/2013   T7 10%, T12 70%    Past Surgical History:  Procedure Laterality Date  . ABDOMINAL HYSTERECTOMY  1942  . ANKLE FRACTURE SURGERY  1960  . BLADDER SUSPENSION    . CLOSED REDUCTION WITH HUMERAL PIN INSERTION  04/01/2012   Procedure: CLOSED REDUCTION WITH HUMERAL PIN INSERTION;  Surgeon: Schuyler Amor, MD;  Location: [facility];  Service: Orthopedics;  Laterality: Left;    No Known Allergies  Outpatient Encounter Medications  as of 08/17/2018  Medication Sig  . amLODipine (NORVASC) 10 MG tablet Take 10 mg by mouth daily.  Marland Kitchen aspirin 81 MG chewable tablet Chew 81 mg by mouth daily.  . Cholecalciferol 1000 units capsule Take 2,000 Units by mouth daily.    Marland Kitchen ketoconazole (NIZORAL) 2 % shampoo Apply 1 application topically once a week. Send shampoo with daughter to beauty shop with resident on Wednesday  . levothyroxine (SYNTHROID, LEVOTHROID) 88 MCG tablet Take 88 mcg by mouth daily before breakfast.  . losartan (COZAAR) 100 MG tablet Take 1 tablet (100 mg total) by mouth daily.  . metoprolol (LOPRESSOR) 100 MG tablet Take 100 mg by mouth 2 (two) times daily.   . Nutritional Supplements (BOOST GLUCOSE CONTROL) LIQD Take 237 mLs by mouth 2 (two) times daily.   . polyethylene glycol powder (GLYCOLAX/MIRALAX) powder Take 17 g by mouth daily. Hold for loose stool  . QUEtiapine (SEROQUEL) 25 MG tablet Take 25 mg by mouth at bedtime.  . raloxifene (EVISTA) 60 MG tablet Take 60 mg by mouth daily.   No facility-administered encounter medications on file as of 08/17/2018.     Review of Systems  Unable to perform ROS: Dementia    Immunization History  Administered Date(s) Administered  . Influenza-Unspecified 03/02/2013, 02/15/2014, 02/16/2015, 02/22/2016, 02/20/2017, 02/23/2018  . PPD Test 03/23/2012  . Pneumococcal Conjugate-13 12/17/2016  . Pneumococcal Polysaccharide-23 09/22/2002  . Td 11/17/2002  . Tdap 12/16/2016   Pertinent  Health Maintenance Due  Topic Date Due  . INFLUENZA VACCINE  12/12/2018  . DEXA SCAN  Completed  . PNA vac Low Risk Adult  Completed   Fall Risk  12/24/2017 12/13/2016 02/07/2015 02/22/2014 02/22/2014  Falls in the past year? Yes No No Yes No  Number falls in past yr: 1 - - 1 -  Injury with Fall? Yes - - - -  Risk for fall due to : - - - History of fall(s) -   Functional Status Survey:    Vitals:   08/17/18 1224  BP: (!) 106/56  Pulse: 60  Resp: 18  Temp: 97.8 F (36.6 C)  SpO2: 97%  Weight: 134 lb 3.2 oz (60.9 kg)  Height: 5\' 1"  (1.549 m)   Body mass index is 25.36 kg/m. Physical Exam Vitals signs and nursing note reviewed.  Constitutional:      General: She is not in acute distress.     Appearance: She is normal weight. She is not ill-appearing.  HENT:     Head: Normocephalic.     Nose: Nose normal. No congestion or rhinorrhea.     Mouth/Throat:     Mouth: Mucous membranes are moist.     Pharynx: Oropharynx is clear. No oropharyngeal exudate or posterior oropharyngeal erythema.  Eyes:     General: No scleral icterus.       Right eye: No discharge.        Left eye: No discharge.     Conjunctiva/sclera: Conjunctivae normal.     Pupils: Pupils are equal, round, and reactive to light.  Neck:     Musculoskeletal: Normal range of motion. No neck rigidity or muscular tenderness.  Cardiovascular:     Rate and Rhythm: Normal rate and regular rhythm.     Pulses: Normal pulses.     Heart sounds: Normal heart sounds. No murmur. No friction rub. No gallop.   Pulmonary:     Effort: Pulmonary effort is normal. No respiratory distress.     Breath sounds: Normal breath sounds.  No wheezing, rhonchi or rales.  Chest:     Chest wall: No tenderness.  Abdominal:     General: Bowel sounds are normal. There is no distension.     Palpations: Abdomen is soft. There is no mass.     Tenderness: There is no abdominal tenderness. There is no right CVA tenderness, left CVA tenderness, guarding or rebound.  Musculoskeletal:        General: No swelling or tenderness.     Right lower leg: No edema.     Left lower leg: No edema.     Comments: Moves x 4 extremities unsteady gait ambulates with walker.   Lymphadenopathy:     Cervical: No cervical adenopathy.  Skin:    General: Skin is warm and dry.     Coloration: Skin is not pale.     Findings: No erythema or rash.     Comments: 1. Right forearm skin tear progressive healing noted without any signs of infections.   2. Left upper shin area and anterior below knee skin tear with steri-strips intact.surrounding skin tissues without any signs of infections.Kerlix wrap with small amounts of old bright red blood.    Neurological:     Mental  Status: She is alert. Mental status is at baseline.     Cranial Nerves: No cranial nerve deficit.     Motor: No weakness.     Coordination: Coordination normal.     Gait: Gait abnormal.  Psychiatric:        Mood and Affect: Mood normal.        Speech: Speech normal.        Behavior: Behavior normal.        Thought Content: Thought content normal.        Cognition and Memory: Cognition is impaired. Memory is impaired.        Judgment: Judgment normal.    Labs reviewed: Recent Labs    09/01/17 01/19/18 05/21/18  NA 135* 139 139  K 4.5 4.1 4.6  CL 100  --  105  CO2 27  --  23  BUN 24* 24* 28*  CREATININE 0.7 0.66 0.7  CALCIUM 8.8 8.8 9.1   Recent Labs    09/01/17 01/19/18 05/21/18  AST 14 11 14   ALT 11 10 11   ALKPHOS 54 37 51  BILITOT  --  0.6  --   PROT 6.0 5.5 6.4  ALBUMIN 3.1 3 3.5   Recent Labs    09/01/17 05/21/18  WBC 9.6 11.8  HGB 12.3 12.9  HCT 35* 38  PLT 243 223   Lab Results  Component Value Date   TSH 3.45 05/21/2018   Lab Results  Component Value Date   HGBA1C 6.7 07/18/2016   Lab Results  Component Value Date   CHOL 146 01/23/2017   HDL 39 01/23/2017   LDLCALC 76 01/23/2017   TRIG 214 (A) 01/23/2017    Significant Diagnostic Results in last 30 days:  No results found.  Assessment/Plan 1. Noninfected skin tear of left lower extremity, initial encounter Afebrile.Left upper shin area and anterior below knee skin tear with steri-strips intact.surrounding skin tissues without any signs of infections.Kerlix wrap with small amounts of old bright red blood.continue to monitor  For signs of infections.    2. Skin tear of forearm without complication, initial encounter Afebrile.Right forearm skin tear progressive healing noted without any signs of infections.continue to monitor.  3. Essential hypertension Refuses blood pressure checks at times latest blood pressure stable.continue  on Amlodipine 10 mg tablet daily,losartan 100 mg tablet daily and  metoprolol 100 mg tablet twice daily.on ASA for prophylaxis.Will reduce amlodipine or losartan if blood pressure and HR remains stable but difficult to monitor due to patient refusal for blood pressure checks.   4. Acquired hypothyroidism Lab Results  Component Value Date   TSH 3.45 05/21/2018  Continue on levothyroxine 88 mcg tablet daily.   5. Vascular dementia with behavior disturbance (HCC) Resistive to care at times.continue to redirect.continue on Seroquel 25 mg tablet at bedtime.continue supportive care.   Family/ staff Communication:  Reviewed plan with patient and facility Nurse.  Labs/tests ordered: None

## 2018-08-27 ENCOUNTER — Encounter: Payer: Self-pay | Admitting: Family

## 2018-08-27 ENCOUNTER — Non-Acute Institutional Stay (SKILLED_NURSING_FACILITY): Payer: Medicare Other | Admitting: Family

## 2018-08-27 DIAGNOSIS — W19XXXA Unspecified fall, initial encounter: Secondary | ICD-10-CM | POA: Diagnosis not present

## 2018-08-27 DIAGNOSIS — R2681 Unsteadiness on feet: Secondary | ICD-10-CM

## 2018-08-27 DIAGNOSIS — M79651 Pain in right thigh: Secondary | ICD-10-CM

## 2018-08-27 DIAGNOSIS — Y92129 Unspecified place in nursing home as the place of occurrence of the external cause: Secondary | ICD-10-CM

## 2018-08-27 DIAGNOSIS — S0101XA Laceration without foreign body of scalp, initial encounter: Secondary | ICD-10-CM

## 2018-08-27 NOTE — Progress Notes (Addendum)
Location:  [facility] Room Number: 2 Place of Service:  SNF (31) Provider:Flora Ratz C,NP   Virgie Dad, MD  Patient Care Team: Virgie Dad, MD as PCP - General (Internal Medicine) Mast, Man X, NP as Nurse Practitioner (Nurse Practitioner) Charlotte Crumb, MD as Consulting Physician (Orthopedic Surgery) Regal, Tamala Fothergill, DPM as Consulting Physician (Podiatry) Gaynelle Arabian, MD as Consulting Physician (Orthopedic Surgery) Zebedee Iba., MD as Referring Physician (Ophthalmology) Newman Pies, MD as Consulting Physician (Neurosurgery) Melina Modena, [facility] (Skilled Nursing and [place]) Mast, Man X, NP as Nurse Practitioner (Internal Medicine) Breylin Dom, Nelda Bucks, NP as Nurse Practitioner (Family Medicine)  Extended Emergency Contact Information Primary Emergency Contact: Devries,[name] Address: [place], [place] [address] [address]itter of [place] Phone: [phone] Mobile Phone: [phone] Relation: Daughter Secondary Emergency Contact: [place] of [place] Phone: [phone] Relation: Grandson  Code Status: DNR  Goals of care: Advanced Directive information Advanced Directives 08/27/2018  Does Patient Have a Medical Advance Directive? Yes  Type of Paramedic of [place];Out of facility DNR (pink MOST or yellow form)  Does patient want to make changes to medical advance directive? No - Patient declined  Copy of [place] in Chart? Yes - validated most recent copy scanned in chart (See row information)  Pre-existing out of facility DNR order (yellow form or pink MOST form) Yellow form placed in chart (order not valid for inpatient use);Pink MOST form placed in chart (order not valid for inpatient use)     Chief Complaint  Patient presents with  . Acute Visit    right thigh/hip pain    HPI:  Pt is a 85 y.o. female seen  today for an acute visit for evaluation of right thigh/hip pain.she is seen in her room with facility Nurse present at bedside.Nurse reports patient was heard yelling out loudly upon arrival to patient's room nurse observed patient lying on the floor between her bed and recliner with head against the wall.scant amounts of bleeding from the head noted.patient was able to move extremities without any difficulties.she ambulated to the bathroom using her front wheel walker without any difficulty and back to bed.Vital signs stable.Nurse states later attempted to assist patient from bed to sit for lunch patient complained of right thigh pain holding thigh area and grimacing.Her vital signs remain stable.    Past Medical History:  Diagnosis Date  . Arthritis   . Cataracts, bilateral   . Closed fracture of unspecified part of lower end of humerus   . Dementia ([place])   . Hypertension   . Insomnia, unspecified   . Macular degeneration (senile) of retina, unspecified   . Osteoarthritis   . Osteoarthrosis, unspecified whether generalized or localized, unspecified site   . Pancreatitis, acute 08/14/2013  . Retinal neovascularization NOS   . Senile osteoporosis   . Subarachnoid hemorrhage following injury ([place]) 03/31/2012  . Unspecified hearing loss   . Unspecified hypothyroidism   . Urinary tract infection 08/10/2013  . Vertebral fracture 08/09/2013   T7 10%, T12 70%    Past Surgical History:  Procedure Laterality Date  . ABDOMINAL HYSTERECTOMY  1942  . ANKLE FRACTURE SURGERY  1960  . BLADDER SUSPENSION    . CLOSED REDUCTION WITH HUMERAL PIN INSERTION  04/01/2012   Procedure: CLOSED REDUCTION WITH HUMERAL PIN INSERTION;  Surgeon: Schuyler Amor, MD;  [facility]: [facility];  Service: Orthopedics;  Laterality: Left;    No Known Allergies  Outpatient Encounter Medications as of 08/27/2018  Medication Sig  . amLODipine (NORVASC) 10 MG tablet Take 10 mg by mouth daily.  Marland Kitchen aspirin 81 MG chewable tablet  Chew 81 mg by mouth daily.  . Cholecalciferol 1000 units capsule Take 2,000 Units by mouth daily.   Marland Kitchen ketoconazole (NIZORAL) 2 % shampoo Apply 1 application topically once a week. Send shampoo with daughter to beauty shop with resident on Wednesday  . levothyroxine (SYNTHROID, LEVOTHROID) 88 MCG tablet Take 88 mcg by mouth daily before breakfast.  . losartan (COZAAR) 100 MG tablet Take 1 tablet (100 mg total) by mouth daily.  . metoprolol (LOPRESSOR) 100 MG tablet Take 100 mg by mouth 2 (two) times daily.   . Nutritional Supplements (BOOST GLUCOSE CONTROL) LIQD Take 237 mLs by mouth 2 (two) times daily.   . polyethylene glycol powder (GLYCOLAX/MIRALAX) powder Take 17 g by mouth daily. Hold for loose stool  . QUEtiapine (SEROQUEL) 25 MG tablet Take 25 mg by mouth at bedtime.  . raloxifene (EVISTA) 60 MG tablet Take 60 mg by mouth daily.   No facility-administered encounter medications on file as of 08/27/2018.     Review of Systems  Unable to perform ROS: Dementia (additional information provided by facility Nurse )    Immunization History  Administered Date(s) Administered  . Influenza-Unspecified 03/02/2013, 02/15/2014, 02/16/2015, 02/22/2016, 02/20/2017, 02/23/2018  . PPD Test 03/23/2012  . Pneumococcal Conjugate-13 12/17/2016  . Pneumococcal Polysaccharide-23 09/22/2002  . Td 11/17/2002  . Tdap 12/16/2016   Pertinent  Health Maintenance Due  Topic Date Due  . INFLUENZA VACCINE  12/12/2018  . DEXA SCAN  Completed  . PNA vac Low Risk Adult  Completed   Fall Risk  12/24/2017 12/13/2016 02/07/2015 02/22/2014 02/22/2014  Falls in the past year? Yes No No Yes No  Number falls in past yr: 1 - - 1 -  Injury with Fall? Yes - - - -  Risk for fall due to : - - - History of fall(s) -    Vitals:   08/27/18 1437  BP: 131/67  Pulse: (!) 56  Resp: 19  Temp: (!) 97.4 F (36.3 C)  Weight: 134 lb 3.2 oz (60.9 kg)  Height: 5\' 1"  (1.549 m)   Body mass index is 25.36 kg/m. Physical Exam  Vitals signs and nursing note reviewed.  Constitutional:      General: She is not in acute distress.    Appearance: She is normal weight. She is not ill-appearing.  HENT:     Head: Normocephalic.     Nose: Nose normal. No congestion or rhinorrhea.     Mouth/Throat:     Mouth: Mucous membranes are moist.     Pharynx: Oropharynx is clear. No oropharyngeal exudate or posterior oropharyngeal erythema.  Eyes:     General: No scleral icterus.       Right eye: No discharge.        Left eye: No discharge.     Conjunctiva/sclera: Conjunctivae normal.     Pupils: Pupils are equal, round, and reactive to light.  Neck:     Musculoskeletal: Normal range of motion. No neck rigidity or muscular tenderness.     Vascular: No carotid bruit.  Cardiovascular:     Rate and Rhythm: Normal rate and regular rhythm.     Pulses: Normal pulses.     Heart sounds: Normal heart sounds. No murmur. No friction rub. No gallop.  Pulmonary:     Effort: Pulmonary effort is normal. No respiratory distress.     Breath sounds: Normal breath sounds. No wheezing, rhonchi or rales.  Chest:     Chest wall: No tenderness.  Abdominal:     General: Bowel sounds are normal. There is no distension.     Palpations: Abdomen is soft. There is no mass.     Tenderness: There is no abdominal tenderness. There is no right CVA tenderness, left CVA tenderness, guarding or rebound.  Musculoskeletal:        General: No tenderness.     Right lower leg: No edema.     Left lower leg: No edema.     Comments: Moves x 4 extremities except grimacing and holding right thigh area with movement of leg in bed.  Lymphadenopathy:     Cervical: No cervical adenopathy.  Skin:    General: Skin is warm and dry.     Coloration: Skin is not pale.     Findings: Bruising present. No erythema or rash.     Comments: 1. Laceration 0.5 cm to left scalp with scant bleeding.no hematoma.  2. Small abrasion to left flank area none tender to palpation.No  bleeding.   Neurological:     Mental Status: She is alert. Mental status is at baseline.     Sensory: No sensory deficit.     Motor: No weakness.     Gait: Gait abnormal.  Psychiatric:        Mood and Affect: Mood normal.        Behavior: Behavior normal.        Thought Content: Thought content normal.        Judgment: Judgment normal.    Labs reviewed: Recent Labs    09/01/17 01/19/18 05/21/18  NA 135* 139 139  K 4.5 4.1 4.6  CL 100  --  105  CO2 27  --  23  BUN 24* 24* 28*  CREATININE 0.7 0.66 0.7  CALCIUM 8.8 8.8 9.1   Recent Labs    09/01/17 01/19/18 05/21/18  AST 14 11 14   ALT 11 10 11   ALKPHOS 54 37 51  BILITOT  --  0.6  --   PROT 6.0 5.5 6.4  ALBUMIN 3.1 3 3.5   Recent Labs    09/01/17 05/21/18  WBC 9.6 11.8  HGB 12.3 12.9  HCT 35* 38  PLT 243 223   Lab Results  Component Value Date   TSH 3.45 05/21/2018   Lab Results  Component Value Date   HGBA1C 6.7 07/18/2016   Lab Results  Component Value Date   CHOL 146 01/23/2017   HDL 39 01/23/2017   LDLCALC 76 01/23/2017   TRIG 214 (A) 01/23/2017    Significant Diagnostic Results in last 30 days:  No results found.  Assessment/Plan 1. Acute pain of right thigh Statu post fall.Griamacing with movement of leg in bed though was able to ambulate without any difficulties post fall.will obtain a portable X-ray for the femur/hip/pelvis to rule out fracture.continue on tylenol as needed for pain.  2. Unsteady gait Ambulates with Front wheel walker. She remains high risk for fall due to impaired cognitive,visual and her poor safety awareness.continue with fall and safety precautions.Not a candidate for therapy due to her cognitive impairment.    3. Fall at nursing home, initial encounter Unwitnessed fall episode heard by nurse yelling out loudly and was observed lying on the floor with head against the wall.later complained of right  thigh pain will obtain portable Femur/hip and pelvis x-ray as above.     4.  Laceration of scalp without foreign body, initial encounter 0.5 cm with scan bleeding.Nurse reports [patient unable to keep ice bag on laceration site.No signs of swelling.laceration cleanse with saline and pat dry.Bleeding stopped.continue to monitor for signs of infections.   Family/ staff Communication: Reviewed plan of care with patient and facility Nurse.   Labs/tests ordered:  portable X-ray for the femur/hip/pelvis to rule out fracture.Poratable due to fall risk and dementia.    Addendum :08/28/2018 Patient's right femur/hip X-ray results shows limited front view shows no acute fracture.DDD to knee and spine noted.Per Nurse patient has been up walking to the bathroom and turning self in bed without any signs of pain today.will continue to monitor.   08/31/2018: nurse reports patient sustained another fall episode complained of right rib cage pain.Portable right rib cage X-ray to rule out fracture.Urine specimen for U/A and C/S rule out UTI.CBC/diff and BMP today to rule out infectious and metabolic etiologies.

## 2018-09-01 ENCOUNTER — Non-Acute Institutional Stay (SKILLED_NURSING_FACILITY): Payer: Medicare Other | Admitting: Nurse Practitioner

## 2018-09-01 ENCOUNTER — Encounter: Payer: Self-pay | Admitting: Internal Medicine

## 2018-09-01 ENCOUNTER — Encounter: Payer: Self-pay | Admitting: Nurse Practitioner

## 2018-09-01 DIAGNOSIS — F01518 Vascular dementia, unspecified severity, with other behavioral disturbance: Secondary | ICD-10-CM

## 2018-09-01 DIAGNOSIS — Z9181 History of falling: Secondary | ICD-10-CM | POA: Diagnosis not present

## 2018-09-01 DIAGNOSIS — F0151 Vascular dementia with behavioral disturbance: Secondary | ICD-10-CM | POA: Diagnosis not present

## 2018-09-01 DIAGNOSIS — D72829 Elevated white blood cell count, unspecified: Secondary | ICD-10-CM

## 2018-09-01 LAB — BASIC METABOLIC PANEL
BUN: 28 — AB (ref 4–21)
Creatinine: 0.7 (ref 0.5–1.1)
Glucose: 150
Potassium: 4.2 (ref 3.4–5.3)
Sodium: 139 (ref 137–147)

## 2018-09-01 LAB — CBC AND DIFFERENTIAL
HCT: 39 (ref 36–46)
Hemoglobin: 13.4 (ref 12.0–16.0)
Platelets: 201 (ref 150–399)
WBC: 14.6

## 2018-09-01 NOTE — Progress Notes (Addendum)
Location:  [facility] Room Number: 2 Place of Service:  SNF (31) Provider: Marlana Latus  NP  Virgie Dad, MD  Patient Care Team: Virgie Dad, MD as PCP - General (Internal Medicine) Karmin Kasprzak X, NP as Nurse Practitioner (Nurse Practitioner) Charlotte Crumb, MD as Consulting Physician (Orthopedic Surgery) Regal, Tamala Fothergill, DPM as Consulting Physician (Podiatry) Gaynelle Arabian, MD as Consulting Physician (Orthopedic Surgery) Zebedee Iba., MD as Referring Physician (Ophthalmology) Newman Pies, MD as Consulting Physician (Neurosurgery) Melina Modena, [facility]) Ugochi Henzler X, NP as Nurse Practitioner (Internal Medicine) Ngetich, Nelda Bucks, NP as Nurse Practitioner (Family Medicine)  Extended Emergency Contact Information Primary Emergency Contact: Meritt,[name] Address: [place], [place] [address] Johnnette Litter of [place] Phone: [phone] Mobile Phone: [phone] Relation: Daughter Secondary Emergency Contact: Bruce of [place] Phone: [phone] Relation: Grandson  Code Status:  DNR Goals of care: Advanced Directive information Advanced Directives 09/08/2018  Does Patient Have a Medical Advance Directive? Yes  Type of Paramedic of [place];Out of facility DNR (pink MOST or yellow form)  Does patient want to make changes to medical advance directive? No - Patient declined  Copy of [place] in Chart? Yes - validated most recent copy scanned in chart (See row information)  Pre-existing out of facility DNR order (yellow form or pink MOST form) Pink MOST form placed in chart (order not valid for inpatient use);Yellow form placed in chart (order not valid for inpatient use)     Chief Complaint  Patient presents with   Acute Visit    C/o - leukocytosis    HPI:  Pt is a 85 y.o. female seen today  for an acute visit for the patient was found to have elevated white blood cells, 08/12/08 wbc 14.6, Hgb 13.4, neutrophils 50.6, Na 139, K 4.2, Bun 28, creat 0.70. pending UA C/S. X-ray R ribs ruled out fractures. She resides in [facility] for safety and care assistance. HPI was proved with assistance of staff. She denied head ache, chest pain/pressure, cough, SOB, or dysuria/abd pain. She is afebrile, no O2 desaturation.    Past Medical History:  Diagnosis Date   Arthritis    Cataracts, bilateral    Closed fracture of unspecified part of lower end of humerus    Dementia ([place])    Hypertension    Insomnia, unspecified    Macular degeneration (senile) of retina, unspecified    Osteoarthritis    Osteoarthrosis, unspecified whether generalized or localized, unspecified site    Pancreatitis, acute 08/14/2013   Retinal neovascularization NOS    Senile osteoporosis    Subarachnoid hemorrhage following injury ([place]) 03/31/2012   Unspecified hearing loss    Unspecified hypothyroidism    Urinary tract infection 08/10/2013   Vertebral fracture 08/09/2013   T7 10%, T12 70%    Past Surgical History:  Procedure Laterality Date   ABDOMINAL HYSTERECTOMY  1942   ANKLE FRACTURE SURGERY  1960   BLADDER SUSPENSION     CLOSED REDUCTION WITH HUMERAL PIN INSERTION  04/01/2012   Procedure: CLOSED REDUCTION WITH HUMERAL PIN INSERTION;  Surgeon: Schuyler Amor, MD;  Location: [facility];  Service: Orthopedics;  Laterality: Left;    No Known Allergies  Outpatient Encounter Medications as of 09/01/2018  Medication Sig   [EXPIRED] acetaminophen (TYLENOL) 325 MG tablet Take 650 mg by mouth  every 4 (four) hours as needed.   amLODipine (NORVASC) 10 MG tablet Take 10 mg by mouth daily.   aspirin 81 MG chewable tablet Chew 81 mg by mouth daily.   Cholecalciferol 1000 units capsule Take 2,000 Units by mouth daily.    ketoconazole (NIZORAL) 2 % shampoo Apply 1 application topically every  Wednesday. Send shampoo with daughter to beauty shop with resident on Wednesday    levothyroxine (SYNTHROID, LEVOTHROID) 88 MCG tablet Take 88 mcg by mouth daily before breakfast.   losartan (COZAAR) 100 MG tablet Take 1 tablet (100 mg total) by mouth daily.   metoprolol (LOPRESSOR) 100 MG tablet Take 100 mg by mouth 2 (two) times daily.    polyethylene glycol powder (GLYCOLAX/MIRALAX) powder Take 17 g by mouth daily. Hold for loose stool   QUEtiapine (SEROQUEL) 25 MG tablet Take 25 mg by mouth at bedtime.   raloxifene (EVISTA) 60 MG tablet Take 60 mg by mouth daily.   [DISCONTINUED] Nutritional Supplements (BOOST GLUCOSE CONTROL) LIQD Take 237 mLs by mouth 2 (two) times daily.    No facility-administered encounter medications on file as of 09/01/2018.     Review of Systems  Constitutional: Negative for activity change.   unable to ROS due to patient's dementia, additional information was provided with assistance of staff.   Immunization History  Administered Date(s) Administered   Influenza-Unspecified 03/02/2013, 02/15/2014, 02/16/2015, 02/22/2016, 02/20/2017, 02/23/2018   PPD Test 03/23/2012   Pneumococcal Conjugate-13 12/17/2016   Pneumococcal Polysaccharide-23 09/22/2002   Td 11/17/2002   Tdap 12/16/2016   Pertinent  Health Maintenance Due  Topic Date Due   INFLUENZA VACCINE  12/12/2018   DEXA SCAN  Completed   PNA vac Low Risk Adult  Completed   Fall Risk  12/24/2017 12/13/2016 02/07/2015 02/22/2014 02/22/2014  Falls in the past year? Yes No No Yes No  Number falls in past yr: 1 - - 1 -  Injury with Fall? Yes - - - -  Risk for fall due to : - - - History of fall(s) -   Functional Status Survey:    Vitals:   09/01/18 1408  BP: 118/62  Pulse: 64  Resp: 18  Temp: (!) 96.1 F (35.6 C)  SpO2: 97%  Weight: 134 lb 3.2 oz (60.9 kg)  Height: 5\' 1"  (1.549 m)   Body mass index is 25.36 kg/m. Physical Exam Vitals signs and nursing note reviewed.    Constitutional:      Appearance: Normal appearance.  HENT:     Head: Normocephalic.     Nose: Nose normal.     Mouth/Throat:     Mouth: Mucous membranes are moist.  Eyes:     Extraocular Movements: Extraocular movements intact.     Pupils: Pupils are equal, round, and reactive to light.  Neck:     Musculoskeletal: Normal range of motion and neck supple.  Cardiovascular:     Rate and Rhythm: Normal rate and regular rhythm.     Heart sounds: No murmur.  Pulmonary:     Effort: Pulmonary effort is normal.     Breath sounds: No wheezing, rhonchi or rales.  Abdominal:     General: There is no distension.     Palpations: Abdomen is soft.     Tenderness: There is no abdominal tenderness. There is no guarding or rebound.  Musculoskeletal:     Right lower leg: No edema.     Left lower leg: No edema.  Skin:    General: Skin is warm  and dry.     Comments: Left shin, right wrist skin tear are healing nicely.   Neurological:     General: No focal deficit present.     Mental Status: She is alert. Mental status is at baseline.     Cranial Nerves: No cranial nerve deficit.     Motor: No weakness.     Coordination: Coordination normal.     Gait: Gait abnormal.     Comments: Oriented to self.   Psychiatric:        Mood and Affect: Mood normal.        Behavior: Behavior normal.     Labs reviewed: Recent Labs    01/19/18 05/21/18  NA 139 139  K 4.1 4.6  CL  --  105  CO2  --  23  BUN 24* 28*  CREATININE 0.66 0.7  CALCIUM 8.8 9.1   Recent Labs    01/19/18 05/21/18  AST 11 14  ALT 10 11  ALKPHOS 37 51  BILITOT 0.6  --   PROT 5.5 6.4  ALBUMIN 3 3.5   Recent Labs    05/21/18  WBC 11.8  HGB 12.9  HCT 38  PLT 223   Lab Results  Component Value Date   TSH 3.45 05/21/2018   Lab Results  Component Value Date   HGBA1C 6.7 07/18/2016   Lab Results  Component Value Date   CHOL 146 01/23/2017   HDL 39 01/23/2017   LDLCALC 76 01/23/2017   TRIG 214 (A) 01/23/2017     Significant Diagnostic Results in last 30 days:  Ct Head Wo Contrast  Result Date: 09/04/2018 CLINICAL DATA:  Fall, head trauma, confusion, neck pain EXAM: CT HEAD WITHOUT CONTRAST CT CERVICAL SPINE WITHOUT CONTRAST TECHNIQUE: Multidetector CT imaging of the head and cervical spine was performed following the standard protocol without intravenous contrast. Multiplanar CT image reconstructions of the cervical spine were also generated. COMPARISON:  06/15/2018 FINDINGS: CT HEAD FINDINGS Brain: There is atrophy and chronic small vessel disease changes. No acute intracranial abnormality. Specifically, no hemorrhage, hydrocephalus, mass lesion, acute infarction, or significant intracranial injury. Old bilateral cerebellar infarcts. Vascular: No hyperdense vessel or unexpected calcification. Skull: No acute calvarial abnormality. Sinuses/Orbits: Visualized paranasal sinuses and mastoids clear. Orbital soft tissues unremarkable. Other: None CT CERVICAL SPINE FINDINGS Alignment: Normal Skull base and vertebrae: No acute fracture. No primary bone lesion or focal pathologic process. Soft tissues and spinal canal: No prevertebral fluid or swelling. No visible canal hematoma. Disc levels:  Diffuse degenerative disc and facet disease. Upper chest: No acute findings Other: None IMPRESSION: Atrophy, chronic microvascular disease. No acute intracranial abnormality. Diffuse degenerative disc and facet disease. No acute bony abnormality in the cervical spine. Electronically Signed   By: Rolm Baptise M.D.   On: 09/04/2018 21:33   Ct Cervical Spine Wo Contrast  Result Date: 09/04/2018 CLINICAL DATA:  Fall, head trauma, confusion, neck pain EXAM: CT HEAD WITHOUT CONTRAST CT CERVICAL SPINE WITHOUT CONTRAST TECHNIQUE: Multidetector CT imaging of the head and cervical spine was performed following the standard protocol without intravenous contrast. Multiplanar CT image reconstructions of the cervical spine were also generated.  COMPARISON:  06/15/2018 FINDINGS: CT HEAD FINDINGS Brain: There is atrophy and chronic small vessel disease changes. No acute intracranial abnormality. Specifically, no hemorrhage, hydrocephalus, mass lesion, acute infarction, or significant intracranial injury. Old bilateral cerebellar infarcts. Vascular: No hyperdense vessel or unexpected calcification. Skull: No acute calvarial abnormality. Sinuses/Orbits: Visualized paranasal sinuses and mastoids clear. Orbital soft tissues  unremarkable. Other: None CT CERVICAL SPINE FINDINGS Alignment: Normal Skull base and vertebrae: No acute fracture. No primary bone lesion or focal pathologic process. Soft tissues and spinal canal: No prevertebral fluid or swelling. No visible canal hematoma. Disc levels:  Diffuse degenerative disc and facet disease. Upper chest: No acute findings Other: None IMPRESSION: Atrophy, chronic microvascular disease. No acute intracranial abnormality. Diffuse degenerative disc and facet disease. No acute bony abnormality in the cervical spine. Electronically Signed   By: Rolm Baptise M.D.   On: 09/04/2018 21:33   Ct Pelvis Wo Contrast  Result Date: 09/04/2018 CLINICAL DATA:  Fall with neck and pelvic pain.  Confusion. EXAM: CT PELVIS WITHOUT CONTRAST TECHNIQUE: Multidetector CT imaging of the pelvis was performed following the standard protocol without intravenous contrast. COMPARISON:  None recent. One view abdomen 09/11/2013. Pelvic CT 08/14/2013. FINDINGS: Urinary Tract: The visualized distal ureters and bladder appear unremarkable. Bowel: No bowel wall thickening, distention or surrounding inflammation identified within the pelvis. Moderate diverticular changes of the sigmoid colon. Vascular/Lymphatic: No enlarged pelvic lymph nodes identified. Moderate diffuse aortoiliac atherosclerosis. Reproductive: 1.6 cm low-density left adnexal lesion on image 76/3 is similar to the previous study, consistent with a benign finding. No suspicious  adnexal findings. The uterus appears normal. Other: There is no ascites or pelvic inflammatory change. Musculoskeletal: The bones appear mildly demineralized. There is no evidence of acute fracture or dislocation. There is lower lumbar spondylosis with mild degenerative changes at the sacroiliac joints. There is mild osteitis pubis. The hip joint spaces appear relatively preserved. There are subcutaneous calcifications lateral to both hips, greater on the right. There is atrophy of the right gluteus musculature which appears chronic. There is ill-defined enlargement of the right iliacus and iliopsoas muscles. There is mild adjacent soft tissue stranding. No evidence of acute hematoma. IMPRESSION: 1. No evidence of acute pelvic or proximal femur fracture. 2. Ill-defined enlargement of the right iliacus and iliopsoas muscles, suggesting a muscle strain, subacute hemorrhage or bursitis. No evidence of sacroiliitis. 3. Sigmoid diverticulosis without acute inflammation. Electronically Signed   By: Richardean Sale M.D.   On: 09/04/2018 21:41    Assessment/Plan Leukocytosis 08/12/08 wbc 14.6, Hgb 13.4, neutrophils 50.6, Na 139, K 4.2, Bun 28, creat 0.70. the patient refused UA C/S, refuses meds sometimes. Staff reported the patient actually is doing better today, she walked  a few times, ate  Better. She is afebrile. Dr. Lyndel Safe advised to repeat CBC/diff in one week. VS q shift x 72hoursObserve.   Vascular dementia with behavior disturbance ([place]) Continue SNF [facility] for safety and care assistance, close supervision needed for safety.   History of fall 09/01/18 X-ray L ribs: no acute fracture or dislocation of ribs.      Family/ staff Communication: plan of care reviewed the patient and charge nurse.   Labs/tests ordered: CBC/diff one week  Time spend 25 minutes

## 2018-09-01 NOTE — Progress Notes (Signed)
This encounter was created in error - please disregard.

## 2018-09-01 NOTE — Assessment & Plan Note (Addendum)
08/12/08 wbc 14.6, Hgb 13.4, neutrophils 50.6, Na 139, K 4.2, Bun 28, creat 0.70. the patient refused UA C/S, refuses meds sometimes. Staff reported the patient actually is doing better today, she walked  a few times, ate  Better. She is afebrile. Dr. Lyndel Safe advised to repeat CBC/diff in one week. VS q shift x 72hoursObserve.

## 2018-09-01 NOTE — Assessment & Plan Note (Signed)
09/01/18 X-ray L ribs: no acute fracture or dislocation of ribs.

## 2018-09-01 NOTE — Assessment & Plan Note (Signed)
Continue SNF [facility] for safety and care assistance, close supervision needed for safety.

## 2018-09-04 ENCOUNTER — Emergency Department (HOSPITAL_COMMUNITY): Payer: Medicare Other

## 2018-09-04 ENCOUNTER — Emergency Department (HOSPITAL_COMMUNITY)
Admission: EM | Admit: 2018-09-04 | Discharge: 2018-09-05 | Disposition: A | Discharge status: Home / Self Care | Payer: Medicare Other | Attending: Emergency Medicine | Admitting: Emergency Medicine

## 2018-09-04 ENCOUNTER — Encounter (HOSPITAL_COMMUNITY): Payer: Self-pay | Admitting: Emergency Medicine

## 2018-09-04 ENCOUNTER — Other Ambulatory Visit: Payer: Self-pay

## 2018-09-04 DIAGNOSIS — Y92129 Unspecified place in nursing home as the place of occurrence of the external cause: Secondary | ICD-10-CM | POA: Insufficient documentation

## 2018-09-04 DIAGNOSIS — Y939 Activity, unspecified: Secondary | ICD-10-CM | POA: Diagnosis not present

## 2018-09-04 DIAGNOSIS — F0390 Unspecified dementia without behavioral disturbance: Secondary | ICD-10-CM | POA: Diagnosis not present

## 2018-09-04 DIAGNOSIS — Z7982 Long term (current) use of aspirin: Secondary | ICD-10-CM | POA: Insufficient documentation

## 2018-09-04 DIAGNOSIS — S0990XA Unspecified injury of head, initial encounter: Secondary | ICD-10-CM | POA: Diagnosis present

## 2018-09-04 DIAGNOSIS — S0101XA Laceration without foreign body of scalp, initial encounter: Secondary | ICD-10-CM | POA: Diagnosis not present

## 2018-09-04 DIAGNOSIS — Y999 Unspecified external cause status: Secondary | ICD-10-CM | POA: Insufficient documentation

## 2018-09-04 DIAGNOSIS — W19XXXA Unspecified fall, initial encounter: Secondary | ICD-10-CM | POA: Insufficient documentation

## 2018-09-04 DIAGNOSIS — Z79899 Other long term (current) drug therapy: Secondary | ICD-10-CM | POA: Insufficient documentation

## 2018-09-04 DIAGNOSIS — M25551 Pain in right hip: Secondary | ICD-10-CM | POA: Insufficient documentation

## 2018-09-04 DIAGNOSIS — I10 Essential (primary) hypertension: Secondary | ICD-10-CM | POA: Insufficient documentation

## 2018-09-04 MED ORDER — LIDOCAINE-EPINEPHRINE (PF) 2 %-1:200000 IJ SOLN
10.0000 mL | Freq: Once | INTRAMUSCULAR | Status: AC
  Administered 2018-09-04: 10 mL
  Filled 2018-09-04: qty 10

## 2018-09-04 NOTE — Discharge Instructions (Addendum)
You were seen in the emergency department for injuries from a fall at your facility.  You had a laceration at the back of your scalp that was repaired with staples that will need to be removed in 7 to 10 days.  You had a CAT scan of your head and your cervical spine that did not show any fractures or bleeding.  You had a CAT scan of your pelvis that did not show any fractures but possible signs of muscle strain around the right hip.  Please follow-up with your doctor and return if any worsening symptoms.

## 2018-09-04 NOTE — ED Notes (Signed)
Bed: RESB Expected date:  Expected time:  Means of arrival:  Comments: EMS 85 yo female fall-hit head/head lac/no blood thinners

## 2018-09-04 NOTE — ED Notes (Signed)
[facility] called for transport.  

## 2018-09-04 NOTE — ED Triage Notes (Signed)
Per EMS, patient from [facility], fall today. Approximately one half inch laceration posterior head. Hx dementia. More confused per staff.

## 2018-09-04 NOTE — ED Provider Notes (Signed)
[facility] DEPT Provider Note   CSN: [id number] Arrival date & time: 09/04/18  2032    History   Chief Complaint Chief Complaint  Patient presents with   Fall    HPI Melissa Carroll is a 85 y.o. female.  Level 5 caveat secondary to dementia.  Patient is here from her facility after a fall today.  She is not known to be on any blood thinners.  Patient denies any complaints but also just keeps saying I do not need to be here.  Staff noted a laceration on her head.  Reportedly she is also had some x-rays of her pelvis that were unremarkable.  She has frequent falls and was last here in February for similar event.     The history is provided by the EMS personnel.  Fall  This is a recurrent problem. The current episode started 3 to 5 hours ago. The problem has not changed since onset.Pertinent negatives include no chest pain. She has tried nothing for the symptoms. The treatment provided no relief.    Past Medical History:  Diagnosis Date   Arthritis    Cataracts, bilateral    Closed fracture of unspecified part of lower end of humerus    Dementia ([place])    Hypertension    Insomnia, unspecified    Macular degeneration (senile) of retina, unspecified    Osteoarthritis    Osteoarthrosis, unspecified whether generalized or localized, unspecified site    Pancreatitis, acute 08/14/2013   Retinal neovascularization NOS    Senile osteoporosis    Subarachnoid hemorrhage following injury ([place]) 03/31/2012   Unspecified hearing loss    Unspecified hypothyroidism    Urinary tract infection 08/10/2013   Vertebral fracture 08/09/2013   T7 10%, T12 70%     Patient Active Problem List   Diagnosis Date Noted   Leukocytosis 09/01/2018   Contusion of head 06/16/2018   Hyperlipidemia 05/30/2017   Vitamin D deficiency 02/19/2017   Osteoporosis with pathological fracture 01/22/2017   Vascular dementia with behavior disturbance ([place])  01/22/2017   Unsteady gait 01/22/2017   History of CVA (cerebrovascular accident) 11/25/2016   Cerebrovascular disease 07/15/2016   Cerebral atrophy ([place]) 07/15/2016   Meningioma ([place]) 07/15/2016   Urine incontinence 01/04/2016   Hypertensive retinopathy 01/24/2015   Posterior vitreous detachment 01/24/2015   Pseudoaphakia 01/24/2015   Chorioretinal scar, macular 10/04/2014   Lower back pain 07/19/2014   Memory deficit 01/07/2014   GERD (gastroesophageal reflux disease) 09/14/2013   Musculoskeletal pain 08/14/2013   Hyperglycemia 08/14/2013   Senile osteoporosis 08/10/2013   Fracture, vertebra, pathologic 08/09/2013   AMD (age-related macular degeneration), wet ([place]) 02/18/2013   Constipation 09/11/2012   Hypothyroidism 08/14/2012   Osteoarthritis 08/14/2012   Insomnia 08/14/2012   Hypertension 04/02/2012   History of fall 03/31/2012   Hard of hearing 03/31/2012   Subarachnoid hemorrhage following injury ([place]) 03/31/2012    Past Surgical History:  Procedure Laterality Date   ABDOMINAL HYSTERECTOMY  1942   ANKLE FRACTURE SURGERY  1960   BLADDER SUSPENSION     CLOSED REDUCTION WITH HUMERAL PIN INSERTION  04/01/2012   Procedure: CLOSED REDUCTION WITH HUMERAL PIN INSERTION;  Surgeon: Schuyler Amor, MD;  Location: [facility];  Service: Orthopedics;  Laterality: Left;     OB History   No obstetric history on file.      Home Medications    Prior to Admission medications   Medication Sig Start Date End Date Taking? Authorizing Provider  amLODipine (NORVASC)  10 MG tablet Take 10 mg by mouth daily.    [provider]  aspirin 81 MG chewable tablet Chew 81 mg by mouth daily.    [provider]  Cholecalciferol 1000 units capsule Take 2,000 Units by mouth daily.     [provider]  ketoconazole (NIZORAL) 2 % shampoo Apply 1 application topically once a week. Send shampoo with daughter to beauty shop with resident on  Wednesday    [provider]  levothyroxine (SYNTHROID, LEVOTHROID) 88 MCG tablet Take 88 mcg by mouth daily before breakfast.    [provider]  losartan (COZAAR) 100 MG tablet Take 1 tablet (100 mg total) by mouth daily. 08/28/17   Blanchie Serve, MD  metoprolol (LOPRESSOR) 100 MG tablet Take 100 mg by mouth 2 (two) times daily.     [provider]  Nutritional Supplements (BOOST GLUCOSE CONTROL) LIQD Take 237 mLs by mouth 2 (two) times daily.     [provider]  polyethylene glycol powder (GLYCOLAX/MIRALAX) powder Take 17 g by mouth daily. Hold for loose stool 01/06/18   Ngetich, Dinah C, NP  QUEtiapine (SEROQUEL) 25 MG tablet Take 25 mg by mouth at bedtime.    [provider]  raloxifene (EVISTA) 60 MG tablet Take 60 mg by mouth daily.    [provider]    Family History Family History  Problem Relation Age of Onset   Hypertension Mother     Social History Social History   Tobacco Use   Smoking status: Never Smoker   Smokeless tobacco: Never Used  Substance Use Topics   Alcohol use: No   Drug use: No     Allergies   Patient has no known allergies.   Review of Systems Review of Systems  Unable to perform ROS: Dementia  Cardiovascular: Negative for chest pain.     Physical Exam Updated Vital Signs BP (!) 150/49    Pulse 60    Temp 97.6 F (36.4 C) (Axillary)    Resp 19    SpO2 98%   Physical Exam Vitals signs and nursing note reviewed.  Constitutional:      General: She is not in acute distress.    Appearance: She is well-developed.  HENT:     Head: Normocephalic.     Comments: 3 cm laceration on her occiput with no active bleeding. Eyes:     Conjunctiva/sclera: Conjunctivae normal.  Neck:     Musculoskeletal: Neck supple.  Cardiovascular:     Rate and Rhythm: Normal rate and regular rhythm.     Heart sounds: No murmur.  Pulmonary:     Effort: Pulmonary effort is normal. No respiratory distress.       Breath sounds: Normal breath sounds. No stridor. No wheezing.  Abdominal:     Palpations: Abdomen is soft.     Tenderness: There is no abdominal tenderness.  Musculoskeletal: Normal range of motion.        General: No tenderness.  Skin:    General: Skin is warm and dry.     Capillary Refill: Capillary refill takes less than 2 seconds.  Neurological:     Mental Status: She is alert. Mental status is at baseline.     GCS: GCS eye subscore is 4. GCS verbal subscore is 5. GCS motor subscore is 6.     Motor: No weakness.      ED Treatments / Results  Labs (all labs ordered are listed, but only abnormal results are displayed) Labs  Reviewed - No data to display  EKG None  Radiology Ct Head Wo Contrast  Result Date: 09/04/2018 CLINICAL DATA:  Fall, head trauma, confusion, neck pain EXAM: CT HEAD WITHOUT CONTRAST CT CERVICAL SPINE WITHOUT CONTRAST TECHNIQUE: Multidetector CT imaging of the head and cervical spine was performed following the standard protocol without intravenous contrast. Multiplanar CT image reconstructions of the cervical spine were also generated. COMPARISON:  06/15/2018 FINDINGS: CT HEAD FINDINGS Brain: There is atrophy and chronic small vessel disease changes. No acute intracranial abnormality. Specifically, no hemorrhage, hydrocephalus, mass lesion, acute infarction, or significant intracranial injury. Old bilateral cerebellar infarcts. Vascular: No hyperdense vessel or unexpected calcification. Skull: No acute calvarial abnormality. Sinuses/Orbits: Visualized paranasal sinuses and mastoids clear. Orbital soft tissues unremarkable. Other: None CT CERVICAL SPINE FINDINGS Alignment: Normal Skull base and vertebrae: No acute fracture. No primary bone lesion or focal pathologic process. Soft tissues and spinal canal: No prevertebral fluid or swelling. No visible canal hematoma. Disc levels:  Diffuse degenerative disc and facet disease. Upper chest: No acute findings Other:  None IMPRESSION: Atrophy, chronic microvascular disease. No acute intracranial abnormality. Diffuse degenerative disc and facet disease. No acute bony abnormality in the cervical spine. Electronically Signed   By: Rolm Baptise M.D.   On: 09/04/2018 21:33   Ct Cervical Spine Wo Contrast  Result Date: 09/04/2018 CLINICAL DATA:  Fall, head trauma, confusion, neck pain EXAM: CT HEAD WITHOUT CONTRAST CT CERVICAL SPINE WITHOUT CONTRAST TECHNIQUE: Multidetector CT imaging of the head and cervical spine was performed following the standard protocol without intravenous contrast. Multiplanar CT image reconstructions of the cervical spine were also generated. COMPARISON:  06/15/2018 FINDINGS: CT HEAD FINDINGS Brain: There is atrophy and chronic small vessel disease changes. No acute intracranial abnormality. Specifically, no hemorrhage, hydrocephalus, mass lesion, acute infarction, or significant intracranial injury. Old bilateral cerebellar infarcts. Vascular: No hyperdense vessel or unexpected calcification. Skull: No acute calvarial abnormality. Sinuses/Orbits: Visualized paranasal sinuses and mastoids clear. Orbital soft tissues unremarkable. Other: None CT CERVICAL SPINE FINDINGS Alignment: Normal Skull base and vertebrae: No acute fracture. No primary bone lesion or focal pathologic process. Soft tissues and spinal canal: No prevertebral fluid or swelling. No visible canal hematoma. Disc levels:  Diffuse degenerative disc and facet disease. Upper chest: No acute findings Other: None IMPRESSION: Atrophy, chronic microvascular disease. No acute intracranial abnormality. Diffuse degenerative disc and facet disease. No acute bony abnormality in the cervical spine. Electronically Signed   By: Rolm Baptise M.D.   On: 09/04/2018 21:33   Ct Pelvis Wo Contrast  Result Date: 09/04/2018 CLINICAL DATA:  Fall with neck and pelvic pain.  Confusion. EXAM: CT PELVIS WITHOUT CONTRAST TECHNIQUE: Multidetector CT imaging of the  pelvis was performed following the standard protocol without intravenous contrast. COMPARISON:  None recent. One view abdomen 09/11/2013. Pelvic CT 08/14/2013. FINDINGS: Urinary Tract: The visualized distal ureters and bladder appear unremarkable. Bowel: No bowel wall thickening, distention or surrounding inflammation identified within the pelvis. Moderate diverticular changes of the sigmoid colon. Vascular/Lymphatic: No enlarged pelvic lymph nodes identified. Moderate diffuse aortoiliac atherosclerosis. Reproductive: 1.6 cm low-density left adnexal lesion on image 76/3 is similar to the previous study, consistent with a benign finding. No suspicious adnexal findings. The uterus appears normal. Other: There is no ascites or pelvic inflammatory change. Musculoskeletal: The bones appear mildly demineralized. There is no evidence of acute fracture or dislocation. There is lower lumbar spondylosis with mild degenerative changes at the sacroiliac joints. There is mild osteitis pubis. The hip joint spaces  appear relatively preserved. There are subcutaneous calcifications lateral to both hips, greater on the right. There is atrophy of the right gluteus musculature which appears chronic. There is ill-defined enlargement of the right iliacus and iliopsoas muscles. There is mild adjacent soft tissue stranding. No evidence of acute hematoma. IMPRESSION: 1. No evidence of acute pelvic or proximal femur fracture. 2. Ill-defined enlargement of the right iliacus and iliopsoas muscles, suggesting a muscle strain, subacute hemorrhage or bursitis. No evidence of sacroiliitis. 3. Sigmoid diverticulosis without acute inflammation. Electronically Signed   By: Richardean Sale M.D.   On: 09/04/2018 21:41    Procedures .Marland KitchenLaceration Repair Date/Time: 09/04/2018 9:05 PM Performed by: Hayden Rasmussen, MD Authorized by: Hayden Rasmussen, MD   Anesthesia (see MAR for exact dosages):    Anesthesia method:  Local infiltration   Local  anesthetic:  Lidocaine 2% WITH epi Laceration details:    Location:  Scalp   Scalp location:  Occipital   Length (cm):  3 Repair type:    Repair type:  Simple Exploration:    Contaminated: no   Treatment:    Area cleansed with:  Saline Skin repair:    Repair method:  Staples   Number of staples:  3 Approximation:    Approximation:  Close Post-procedure details:    Dressing:  Open (no dressing)   Patient tolerance of procedure:  Tolerated well, no immediate complications   (including critical care time)  Medications Ordered in ED Medications  lidocaine-EPINEPHrine (XYLOCAINE W/EPI) 2 %-1:200000 (PF) injection 10 mL (has no administration in time range)     Initial Impression / Assessment and Plan / ED Course  I have reviewed the triage vital signs and the nursing notes.  Pertinent labs & imaging results that were available during my care of the patient were reviewed by me and considered in my medical decision making (see chart for details).  Clinical Course as of Sep 04 2315  Fri Apr 24, 556  1937 85 year old female DNR/DNI with history of dementia here after a fall.  Sounds like there is been multiple falls at her facility recently.  She had a scalp laceration that was repaired with staples.  CT head C-spine showed no acute findings.   [MB]  2210 Patient seemed to have some discomfort when flexing extending at her hips and reportedly negative plain films so I elected to get a CT of her pelvis which did not show any acute fractures but did show some muscle thickening that possibly was a hematoma or strain.  IMPRESSION: 1. No evidence of acute pelvic or proximal femur fracture. 2. Ill-defined enlargement of the right iliacus and iliopsoas muscles, suggesting a muscle strain, subacute hemorrhage or bursitis. No evidence of sacroiliitis. 3. Sigmoid diverticulosis without acute inflammation.     [MB]    Clinical Course User Index [MB] Hayden Rasmussen, MD          Final Clinical Impressions(s) / ED Diagnoses   Final diagnoses:  Laceration of scalp, initial encounter  Injury of head, initial encounter  Fall, initial encounter  Pain of right hip joint    ED Discharge Orders    None       Hayden Rasmussen, MD 09/04/18 2318

## 2018-09-04 NOTE — ED Notes (Signed)
Patient transported to CT 

## 2018-09-05 IMAGING — CR DG TIBIA/FIBULA 2V*L*
4 series · 4 of 4 positions shown · non-contrast
Comparison: None.

CLINICAL DATA: Unwitnessed fall, ecchymosis in the left lower
extremity

EXAM:
LEFT TIBIA AND FIBULA - 2 VIEW

[x tib-fib ap left (1 of 2)]
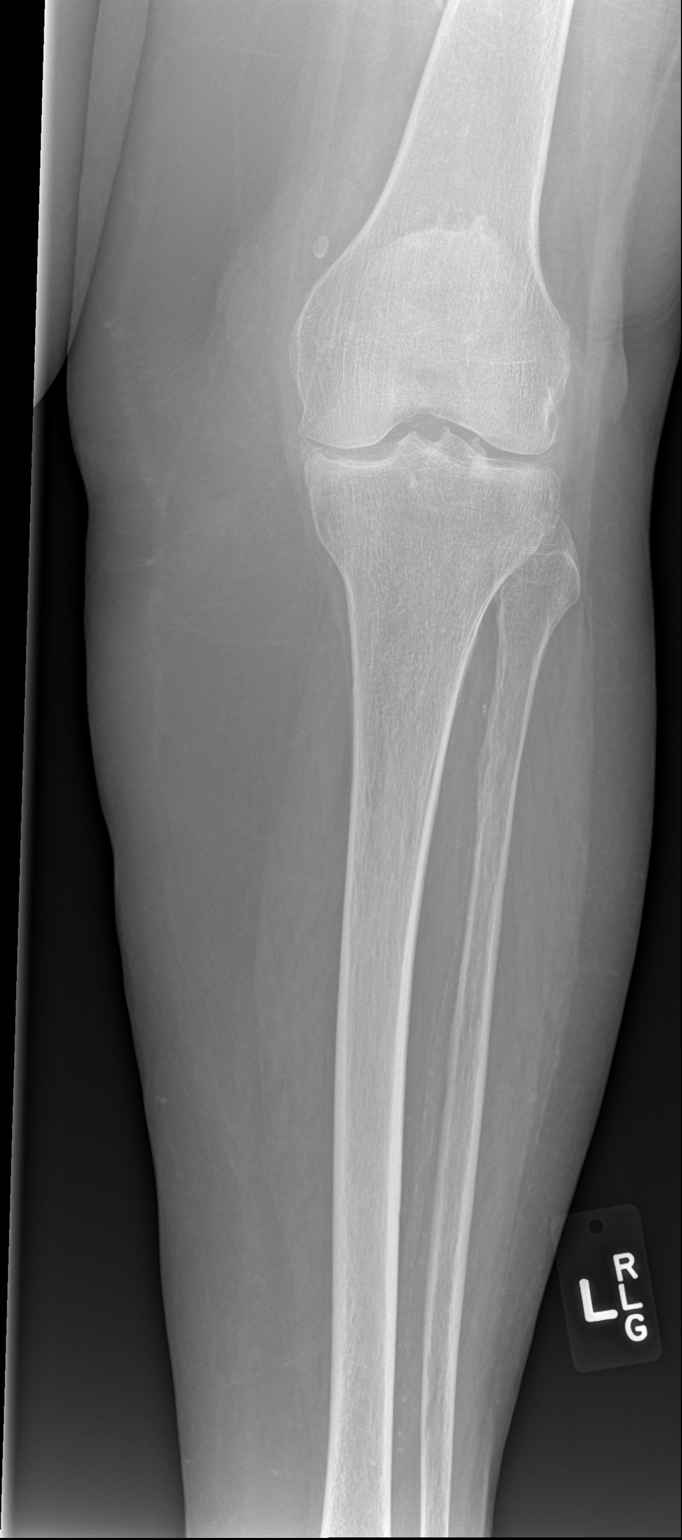

[x tib-fib ap left (2 of 2)]
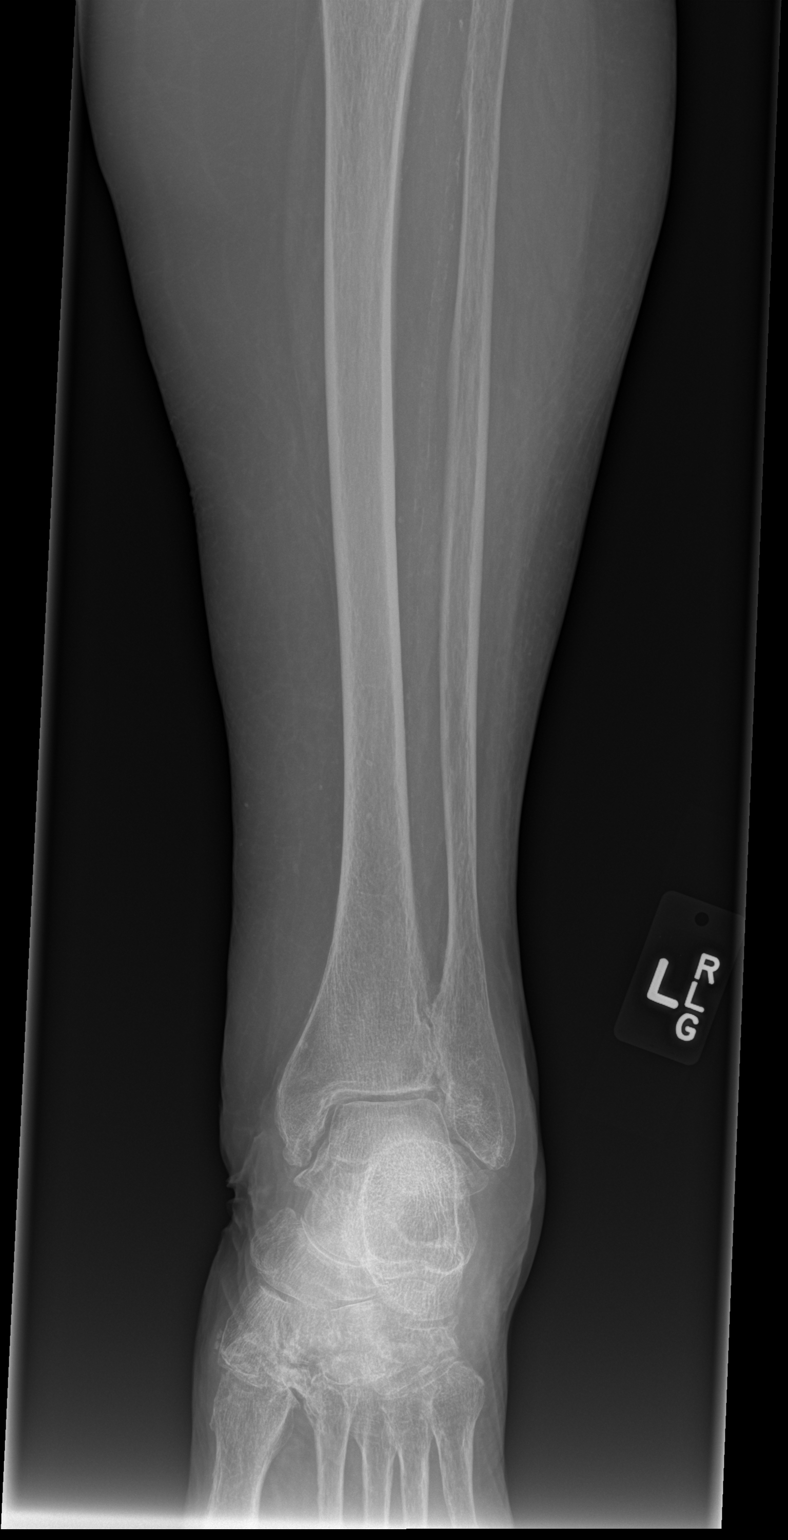

[x tib-fib lat left (1 of 2)]
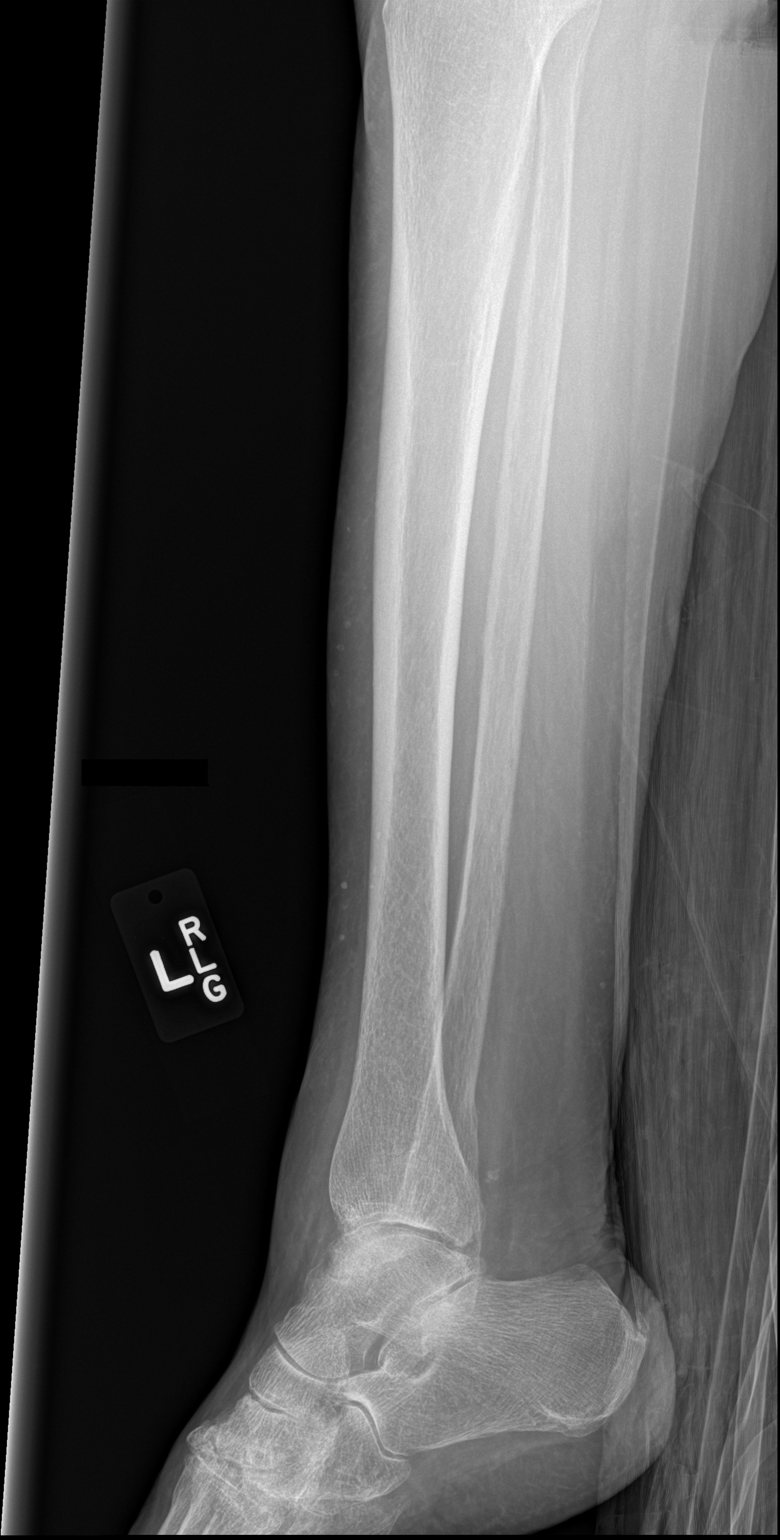

[x tib-fib lat left (2 of 2)]
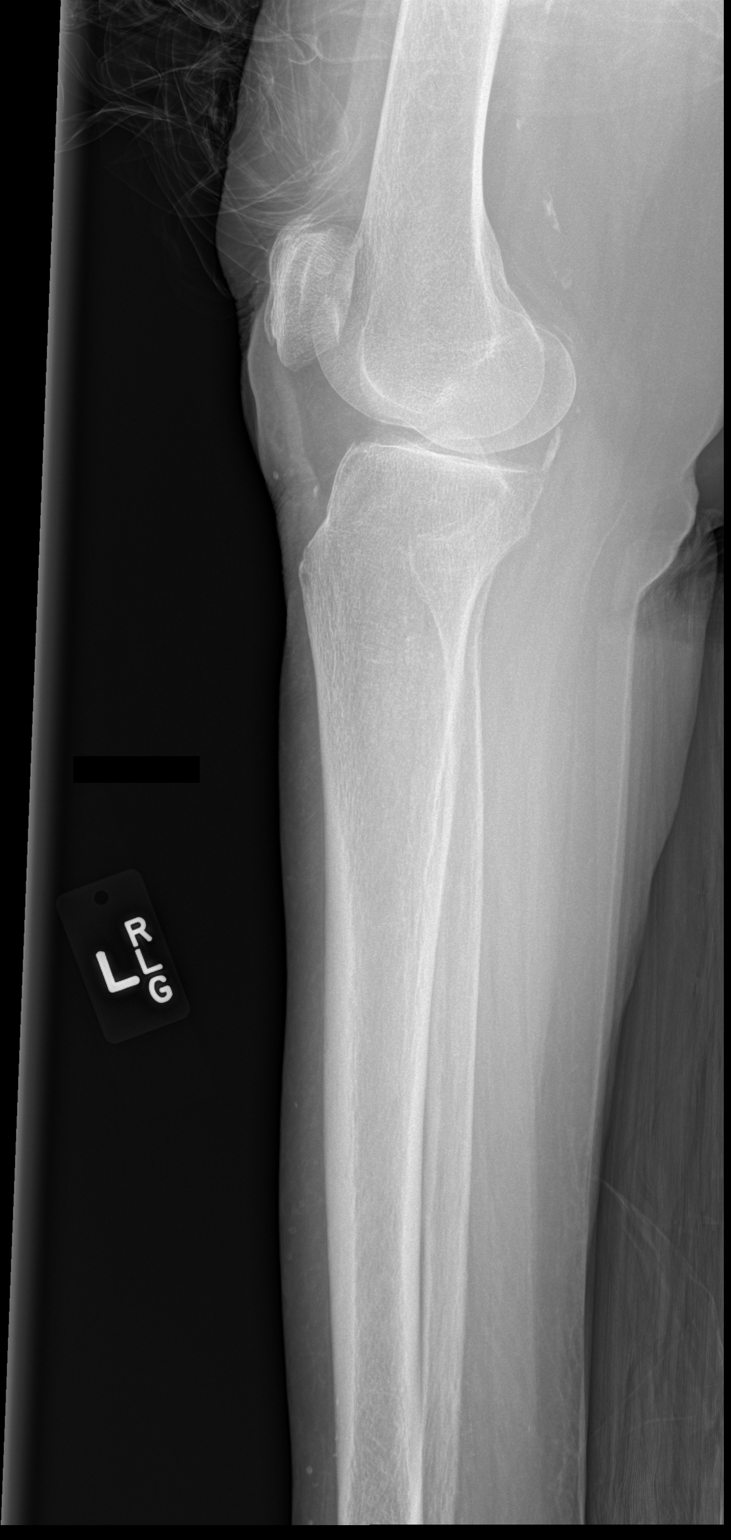

[4 of 4 positions shown; findings below may reference images not displayed]

FINDINGS: No fracture. No suspicious focal osseous lesion. No radiopaque
foreign body. Small enthesophyte at the superior left patella.
Degenerative changes at the left ankle joint. Small Achilles left
calcaneal spur. Degenerative changes in the dorsal left tarsal
joints.
IMPRESSION: No fracture.

## 2018-09-05 NOTE — ED Notes (Addendum)
PTAR  At bedside to transport.

## 2018-09-07 LAB — CBC AND DIFFERENTIAL
HCT: 36 (ref 36–46)
Hemoglobin: 12.4 (ref 12.0–16.0)
Platelets: 240 (ref 150–399)
WBC: 9

## 2018-09-08 ENCOUNTER — Encounter: Payer: Self-pay | Admitting: Nurse Practitioner

## 2018-09-08 ENCOUNTER — Non-Acute Institutional Stay (SKILLED_NURSING_FACILITY): Payer: Medicare Other | Admitting: Nurse Practitioner

## 2018-09-08 DIAGNOSIS — Z9181 History of falling: Secondary | ICD-10-CM

## 2018-09-08 DIAGNOSIS — S0101XD Laceration without foreign body of scalp, subsequent encounter: Secondary | ICD-10-CM

## 2018-09-08 DIAGNOSIS — R2681 Unsteadiness on feet: Secondary | ICD-10-CM | POA: Diagnosis not present

## 2018-09-08 DIAGNOSIS — I10 Essential (primary) hypertension: Secondary | ICD-10-CM

## 2018-09-08 DIAGNOSIS — R413 Other amnesia: Secondary | ICD-10-CM | POA: Diagnosis not present

## 2018-09-08 DIAGNOSIS — S0012XD Contusion of left eyelid and periocular area, subsequent encounter: Secondary | ICD-10-CM | POA: Diagnosis not present

## 2018-09-08 NOTE — Assessment & Plan Note (Signed)
Frequent falling, lack of safety awareness and increased frailty/gait abnormality are contributory.

## 2018-09-08 NOTE — Assessment & Plan Note (Signed)
09/07/18 wbc 9.0, Hgb 12.4, plt 240, neutrophils 38.7

## 2018-09-08 NOTE — Assessment & Plan Note (Signed)
Continue to ambulate with walker with supervision.

## 2018-09-08 NOTE — Assessment & Plan Note (Signed)
Lack of safety awareness, close supervision for safety, continue SN[facility] for safety and care assistance.

## 2018-09-08 NOTE — Assessment & Plan Note (Signed)
blood pressure is controlled, continue Metoprolol 100mg  bid, Losartan 100mg  qd, Amlodipine 10mg  qd.

## 2018-09-08 NOTE — Assessment & Plan Note (Signed)
ED evaluation 09/04/18  for fall, scalp laceration with staple closure x 3 on occiput. CT head, X-ray pelvis unremarkable.. healing nicely.

## 2018-09-08 NOTE — Progress Notes (Signed)
Location:  [facility] Room Number: 2 Place of Service:  SNF (31) Provider:  Marlana Latus  NP  Virgie Dad, MD  Patient Care Team: Virgie Dad, MD as PCP - General (Internal Medicine) Pace Lamadrid X, NP as Nurse Practitioner (Nurse Practitioner) Charlotte Crumb, MD as Consulting Physician (Orthopedic Surgery) Regal, Tamala Fothergill, DPM as Consulting Physician (Podiatry) Gaynelle Arabian, MD as Consulting Physician (Orthopedic Surgery) Zebedee Iba., MD as Referring Physician (Ophthalmology) Newman Pies, MD as Consulting Physician (Neurosurgery) Melina Modena, [facility] (Skilled Nursing and [place]) Zealand Boyett X, NP as Nurse Practitioner (Internal Medicine) Ngetich, Nelda Bucks, NP as Nurse Practitioner (Family Medicine)  Extended Emergency Contact Information Primary Emergency Contact: Grabill,[name] Address: [place], [place] [address] Johnnette Litter of [place] Phone: [phone] Mobile Phone: [phone] Relation: Daughter Secondary Emergency Contact: [place] of [place] Phone: [phone] Relation: Grandson  Code Status:  DNR Goals of care: Advanced Directive information Advanced Directives 09/08/2018  Does Patient Have a Medical Advance Directive? Yes  Type of Paramedic of [place];Out of facility DNR (pink MOST or yellow form)  Does patient want to make changes to medical advance directive? No - Patient declined  Copy of [place] in Chart? Yes - validated most recent copy scanned in chart (See row information)  Pre-existing out of facility DNR order (yellow form or pink MOST form) Pink MOST form placed in chart (order not valid for inpatient use);Yellow form placed in chart (order not valid for inpatient use)     Chief Complaint  Patient presents with  . Hospitalization Follow-up    ED visit - 4/24 head laceration    HPI:  Pt is  a 85 y.o. female seen today for f/u ED evaluation 09/04/18  for fall, scalp laceration with staple closure x 3 on occiput. CT head, X-ray pelvis unremarkable. HPI was provided with assistance of staff. She resides in [facility] for safety and care assistance, ambulates with walker. HTN, blood pressure is controlled on Metoprolol 100mg  bid, Losartan 100mg  qd, Amlodipine 10mg  qd. She denied headache, dizziness, change of vision, chest pain/pressure, or palpitation. No noted focal weakness.   Past Medical History:  Diagnosis Date  . Arthritis   . Cataracts, bilateral   . Closed fracture of unspecified part of lower end of humerus   . Dementia ([place])   . Hypertension   . Insomnia, unspecified   . Macular degeneration (senile) of retina, unspecified   . Osteoarthritis   . Osteoarthrosis, unspecified whether generalized or localized, unspecified site   . Pancreatitis, acute 08/14/2013  . Retinal neovascularization NOS   . Senile osteoporosis   . Subarachnoid hemorrhage following injury ([place]) 03/31/2012  . Unspecified hearing loss   . Unspecified hypothyroidism   . Urinary tract infection 08/10/2013  . Vertebral fracture 08/09/2013   T7 10%, T12 70%    Past Surgical History:  Procedure Laterality Date  . ABDOMINAL HYSTERECTOMY  1942  . ANKLE FRACTURE SURGERY  1960  . BLADDER SUSPENSION    . CLOSED REDUCTION WITH HUMERAL PIN INSERTION  04/01/2012   Procedure: CLOSED REDUCTION WITH HUMERAL PIN INSERTION;  Surgeon: Schuyler Amor, MD;  Location: [facility] ORS;  Service: Orthopedics;  Laterality: Left;    No Known Allergies  Outpatient Encounter Medications as of 09/08/2018  Medication Sig  . acetaminophen (TYLENOL) 325 MG tablet Take 650 mg by  mouth every 4 (four) hours as needed for moderate pain.  Marland Kitchen amLODipine (NORVASC) 10 MG tablet Take 10 mg by mouth daily.  Marland Kitchen aspirin 81 MG chewable tablet Chew 81 mg by mouth daily.  . Cholecalciferol 1000 units capsule Take 2,000 Units by mouth daily.   Marland Kitchen  ketoconazole (NIZORAL) 2 % shampoo Apply 1 application topically every Wednesday. Send shampoo with daughter to beauty shop with resident on Wednesday   . levothyroxine (SYNTHROID, LEVOTHROID) 88 MCG tablet Take 88 mcg by mouth daily before breakfast.  . losartan (COZAAR) 100 MG tablet Take 1 tablet (100 mg total) by mouth daily.  . metoprolol (LOPRESSOR) 100 MG tablet Take 100 mg by mouth 2 (two) times daily.   . Nutritional Supplements (BOOST GLUCOSE CONTROL) LIQD Take 237 mLs by mouth 2 (two) times daily between meals.  . polyethylene glycol powder (GLYCOLAX/MIRALAX) powder Take 17 g by mouth daily. Hold for loose stool  . QUEtiapine (SEROQUEL) 25 MG tablet Take 25 mg by mouth at bedtime.  . raloxifene (EVISTA) 60 MG tablet Take 60 mg by mouth daily.   No facility-administered encounter medications on file as of 09/08/2018.     Review of Systems Unable to review systems dementia, other information was provided with assistance of staff.   Immunization History  Administered Date(s) Administered  . Influenza-Unspecified 03/02/2013, 02/15/2014, 02/16/2015, 02/22/2016, 02/20/2017, 02/23/2018  . PPD Test 03/23/2012  . Pneumococcal Conjugate-13 12/17/2016  . Pneumococcal Polysaccharide-23 09/22/2002  . Td 11/17/2002  . Tdap 12/16/2016   Pertinent  Health Maintenance Due  Topic Date Due  . INFLUENZA VACCINE  12/12/2018  . DEXA SCAN  Completed  . PNA vac Low Risk Adult  Completed   Fall Risk  12/24/2017 12/13/2016 02/07/2015 02/22/2014 02/22/2014  Falls in the past year? Yes No No Yes No  Number falls in past yr: 1 - - 1 -  Injury with Fall? Yes - - - -  Risk for fall due to : - - - History of fall(s) -   Functional Status Survey:    Vitals:   09/08/18 0851  BP: 108/66  Pulse: 61  Resp: 16  Temp: (!) 97.5 F (36.4 C)  SpO2: 97%  Weight: 134 lb 3.2 oz (60.9 kg)  Height: 5\' 1"  (1.549 m)   Body mass index is 25.36 kg/m. Physical Exam Vitals signs and nursing note reviewed.   Constitutional:      General: She is not in acute distress.    Appearance: Normal appearance. She is not ill-appearing, toxic-appearing or diaphoretic.  HENT:     Head: Normocephalic and atraumatic.     Nose: Nose normal.     Mouth/Throat:     Mouth: Mucous membranes are moist.  Eyes:     Extraocular Movements: Extraocular movements intact.     Conjunctiva/sclera: Conjunctivae normal.     Pupils: Pupils are equal, round, and reactive to light.  Neck:     Musculoskeletal: Normal range of motion and neck supple.  Cardiovascular:     Rate and Rhythm: Normal rate and regular rhythm.     Heart sounds: No murmur.  Pulmonary:     Effort: Pulmonary effort is normal.     Breath sounds: No wheezing, rhonchi or rales.  Abdominal:     General: There is no distension.     Palpations: Abdomen is soft.     Tenderness: There is no abdominal tenderness. There is no right CVA tenderness, left CVA tenderness, guarding or rebound.  Musculoskeletal:  Right lower leg: No edema.     Left lower leg: No edema.     Comments: Ambulates with walker with SBA  Skin:    General: Skin is warm and dry.     Comments: Occiput scalp laceration staple closures x3, intact, no s/s of infection.   Neurological:     General: No focal deficit present.     Mental Status: She is alert. Mental status is at baseline.     Cranial Nerves: No cranial nerve deficit.     Motor: No weakness.     Coordination: Coordination normal.     Gait: Gait abnormal.     Comments: Oriented to self.   Psychiatric:        Mood and Affect: Mood normal.        Behavior: Behavior normal.     Labs reviewed: Recent Labs    01/19/18 05/21/18  NA 139 139  K 4.1 4.6  CL  --  105  CO2  --  23  BUN 24* 28*  CREATININE 0.66 0.7  CALCIUM 8.8 9.1   Recent Labs    01/19/18 05/21/18  AST 11 14  ALT 10 11  ALKPHOS 37 51  BILITOT 0.6  --   PROT 5.5 6.4  ALBUMIN 3 3.5   Recent Labs    05/21/18  WBC 11.8  HGB 12.9  HCT 38  PLT  223   Lab Results  Component Value Date   TSH 3.45 05/21/2018   Lab Results  Component Value Date   HGBA1C 6.7 07/18/2016   Lab Results  Component Value Date   CHOL 146 01/23/2017   HDL 39 01/23/2017   LDLCALC 76 01/23/2017   TRIG 214 (A) 01/23/2017    Significant Diagnostic Results in last 30 days:  Ct Head Wo Contrast  Result Date: 09/04/2018 CLINICAL DATA:  Fall, head trauma, confusion, neck pain EXAM: CT HEAD WITHOUT CONTRAST CT CERVICAL SPINE WITHOUT CONTRAST TECHNIQUE: Multidetector CT imaging of the head and cervical spine was performed following the standard protocol without intravenous contrast. Multiplanar CT image reconstructions of the cervical spine were also generated. COMPARISON:  06/15/2018 FINDINGS: CT HEAD FINDINGS Brain: There is atrophy and chronic small vessel disease changes. No acute intracranial abnormality. Specifically, no hemorrhage, hydrocephalus, mass lesion, acute infarction, or significant intracranial injury. Old bilateral cerebellar infarcts. Vascular: No hyperdense vessel or unexpected calcification. Skull: No acute calvarial abnormality. Sinuses/Orbits: Visualized paranasal sinuses and mastoids clear. Orbital soft tissues unremarkable. Other: None CT CERVICAL SPINE FINDINGS Alignment: Normal Skull base and vertebrae: No acute fracture. No primary bone lesion or focal pathologic process. Soft tissues and spinal canal: No prevertebral fluid or swelling. No visible canal hematoma. Disc levels:  Diffuse degenerative disc and facet disease. Upper chest: No acute findings Other: None IMPRESSION: Atrophy, chronic microvascular disease. No acute intracranial abnormality. Diffuse degenerative disc and facet disease. No acute bony abnormality in the cervical spine. Electronically Signed   By: Rolm Baptise M.D.   On: 09/04/2018 21:33   Ct Cervical Spine Wo Contrast  Result Date: 09/04/2018 CLINICAL DATA:  Fall, head trauma, confusion, neck pain EXAM: CT HEAD WITHOUT  CONTRAST CT CERVICAL SPINE WITHOUT CONTRAST TECHNIQUE: Multidetector CT imaging of the head and cervical spine was performed following the standard protocol without intravenous contrast. Multiplanar CT image reconstructions of the cervical spine were also generated. COMPARISON:  06/15/2018 FINDINGS: CT HEAD FINDINGS Brain: There is atrophy and chronic small vessel disease changes. No acute intracranial abnormality. Specifically, no hemorrhage,  hydrocephalus, mass lesion, acute infarction, or significant intracranial injury. Old bilateral cerebellar infarcts. Vascular: No hyperdense vessel or unexpected calcification. Skull: No acute calvarial abnormality. Sinuses/Orbits: Visualized paranasal sinuses and mastoids clear. Orbital soft tissues unremarkable. Other: None CT CERVICAL SPINE FINDINGS Alignment: Normal Skull base and vertebrae: No acute fracture. No primary bone lesion or focal pathologic process. Soft tissues and spinal canal: No prevertebral fluid or swelling. No visible canal hematoma. Disc levels:  Diffuse degenerative disc and facet disease. Upper chest: No acute findings Other: None IMPRESSION: Atrophy, chronic microvascular disease. No acute intracranial abnormality. Diffuse degenerative disc and facet disease. No acute bony abnormality in the cervical spine. Electronically Signed   By: Rolm Baptise M.D.   On: 09/04/2018 21:33   Ct Pelvis Wo Contrast  Result Date: 09/04/2018 CLINICAL DATA:  Fall with neck and pelvic pain.  Confusion. EXAM: CT PELVIS WITHOUT CONTRAST TECHNIQUE: Multidetector CT imaging of the pelvis was performed following the standard protocol without intravenous contrast. COMPARISON:  None recent. One view abdomen 09/11/2013. Pelvic CT 08/14/2013. FINDINGS: Urinary Tract: The visualized distal ureters and bladder appear unremarkable. Bowel: No bowel wall thickening, distention or surrounding inflammation identified within the pelvis. Moderate diverticular changes of the sigmoid  colon. Vascular/Lymphatic: No enlarged pelvic lymph nodes identified. Moderate diffuse aortoiliac atherosclerosis. Reproductive: 1.6 cm low-density left adnexal lesion on image 76/3 is similar to the previous study, consistent with a benign finding. No suspicious adnexal findings. The uterus appears normal. Other: There is no ascites or pelvic inflammatory change. Musculoskeletal: The bones appear mildly demineralized. There is no evidence of acute fracture or dislocation. There is lower lumbar spondylosis with mild degenerative changes at the sacroiliac joints. There is mild osteitis pubis. The hip joint spaces appear relatively preserved. There are subcutaneous calcifications lateral to both hips, greater on the right. There is atrophy of the right gluteus musculature which appears chronic. There is ill-defined enlargement of the right iliacus and iliopsoas muscles. There is mild adjacent soft tissue stranding. No evidence of acute hematoma. IMPRESSION: 1. No evidence of acute pelvic or proximal femur fracture. 2. Ill-defined enlargement of the right iliacus and iliopsoas muscles, suggesting a muscle strain, subacute hemorrhage or bursitis. No evidence of sacroiliitis. 3. Sigmoid diverticulosis without acute inflammation. Electronically Signed   By: Richardean Sale M.D.   On: 09/04/2018 21:41    Assessment/Plan Contusion of head 09/07/18 wbc 9.0, Hgb 12.4, plt 240, neutrophils 38.7  Scalp laceration ED evaluation 09/04/18  for fall, scalp laceration with staple closure x 3 on occiput. CT head, X-ray pelvis unremarkable.. healing nicely.   Unsteady gait Continue to ambulate with walker with supervision.   Memory deficit Lack of safety awareness, close supervision for safety, continue SNF [facility] for safety and care assistance.   History of fall Frequent falling, lack of safety awareness and increased frailty/gait abnormality are contributory.   Hypertension blood pressure is controlled, continue  Metoprolol 100mg  bid, Losartan 100mg  qd, Amlodipine 10mg  qd.      Family/ staff Communication: plan of care reviewed with the patient and charge nurse.   Labs/tests ordered:  none  Time spend 25 minutes.

## 2018-09-11 ENCOUNTER — Non-Acute Institutional Stay (SKILLED_NURSING_FACILITY): Payer: Medicare Other | Admitting: Internal Medicine

## 2018-09-11 ENCOUNTER — Encounter: Payer: Self-pay | Admitting: Internal Medicine

## 2018-09-11 DIAGNOSIS — S0101XD Laceration without foreign body of scalp, subsequent encounter: Secondary | ICD-10-CM

## 2018-09-11 DIAGNOSIS — I10 Essential (primary) hypertension: Secondary | ICD-10-CM | POA: Diagnosis not present

## 2018-09-11 DIAGNOSIS — E039 Hypothyroidism, unspecified: Secondary | ICD-10-CM

## 2018-09-11 DIAGNOSIS — F01518 Vascular dementia, unspecified severity, with other behavioral disturbance: Secondary | ICD-10-CM

## 2018-09-11 DIAGNOSIS — F0151 Vascular dementia with behavioral disturbance: Secondary | ICD-10-CM

## 2018-09-11 DIAGNOSIS — M8000XS Age-related osteoporosis with current pathological fracture, unspecified site, sequela: Secondary | ICD-10-CM

## 2018-09-11 NOTE — Progress Notes (Signed)
Location:  [facility] Room Number: 2/A Place of Service:  SNF (31) Provider:Tayvin Preslar L.MD   Virgie Dad, MD  Patient Care Team: Virgie Dad, MD as PCP - General (Internal Medicine) Mast, Man X, NP as Nurse Practitioner (Nurse Practitioner) Charlotte Crumb, MD as Consulting Physician (Orthopedic Surgery) Regal, Tamala Fothergill, DPM as Consulting Physician (Podiatry) Gaynelle Arabian, MD as Consulting Physician (Orthopedic Surgery) Zebedee Iba., MD as Referring Physician (Ophthalmology) Newman Pies, MD as Consulting Physician (Neurosurgery) Melina Modena, [facility] (Skilled Nursing and [place]) Mast, Man X, NP as Nurse Practitioner (Internal Medicine) Ngetich, Nelda Bucks, NP as Nurse Practitioner (Family Medicine)  Extended Emergency Contact Information Primary Emergency Contact: Semple,[name] Address: [place], [place] [address] Johnnette Litter of [place] Phone: [phone] Mobile Phone: [phone] Relation: Daughter Secondary Emergency Contact: [place] of [place] Phone: [phone] Relation: Grandson  Code Status: DNR  Goals of care: Advanced Directive information Advanced Directives 09/11/2018  Does Patient Have a Medical Advance Directive? Yes  Type of Paramedic of [place];Out of facility DNR (pink MOST or yellow form)  Does patient want to make changes to medical advance directive? No - Patient declined  Copy of [place] in Chart? Yes - validated most recent copy scanned in chart (See row information)  Pre-existing out of facility DNR order (yellow form or pink MOST form) Yellow form placed in chart (order not valid for inpatient use);Pink MOST form placed in chart (order not valid for inpatient use)     Chief Complaint  Patient presents with   Medical Management of Chronic Issues    Routine visit     HPI:  Pt is a 85  y.o. female seen today for medical management of chronic diseases.    Patient has h/o Hypertension, Hypothyroidism, Dementia with Behavior issues and Osteoporosis Patient recently had a fall where she sustained laceration and had to be send to the ED She has stales in her head.Her Imaging including CT of head and spine was all negative She did not have any complains. Has Advanced dementia. Walks with the walker but very unsteady. Will resists care sometimes Weight is stable Appetite is fair   Past Medical History:  Diagnosis Date   Arthritis    Cataracts, bilateral    Closed fracture of unspecified part of lower end of humerus    Dementia ([place])    Hypertension    Insomnia, unspecified    Macular degeneration (senile) of retina, unspecified    Osteoarthritis    Osteoarthrosis, unspecified whether generalized or localized, unspecified site    Pancreatitis, acute 08/14/2013   Retinal neovascularization NOS    Senile osteoporosis    Subarachnoid hemorrhage following injury ([place]) 03/31/2012   Unspecified hearing loss    Unspecified hypothyroidism    Urinary tract infection 08/10/2013   Vertebral fracture 08/09/2013   T7 10%, T12 70%    Past Surgical History:  Procedure Laterality Date   ABDOMINAL HYSTERECTOMY  1942   ANKLE FRACTURE SURGERY  1960   BLADDER SUSPENSION     CLOSED REDUCTION WITH HUMERAL PIN INSERTION  04/01/2012   Procedure: CLOSED REDUCTION WITH HUMERAL PIN INSERTION;  Surgeon: Schuyler Amor, MD;  Location: [facility];  Service: Orthopedics;  Laterality: Left;    No Known Allergies  Outpatient Encounter Medications as of 09/11/2018  Medication Sig   amLODipine (NORVASC) 10 MG  tablet Take 10 mg by mouth daily.   aspirin 81 MG chewable tablet Chew 81 mg by mouth daily.   Cholecalciferol 1000 units capsule Take 2,000 Units by mouth daily.    ketoconazole (NIZORAL) 2 % shampoo Apply 1 application topically every Wednesday. Send shampoo with  daughter to beauty shop with resident on Wednesday    levothyroxine (SYNTHROID, LEVOTHROID) 88 MCG tablet Take 88 mcg by mouth daily before breakfast.   losartan (COZAAR) 100 MG tablet Take 1 tablet (100 mg total) by mouth daily.   metoprolol (LOPRESSOR) 100 MG tablet Take 100 mg by mouth 2 (two) times daily.    Nutritional Supplements (BOOST GLUCOSE CONTROL) LIQD Take 237 mLs by mouth 2 (two) times daily between meals.   polyethylene glycol powder (GLYCOLAX/MIRALAX) powder Take 17 g by mouth daily. Hold for loose stool   QUEtiapine (SEROQUEL) 25 MG tablet Take 25 mg by mouth at bedtime.   raloxifene (EVISTA) 60 MG tablet Take 60 mg by mouth daily.   [DISCONTINUED] acetaminophen (TYLENOL) 325 MG tablet Take 650 mg by mouth every 4 (four) hours as needed for moderate pain.   No facility-administered encounter medications on file as of 09/11/2018.     Review of Systems  Unable to perform ROS: Dementia    Immunization History  Administered Date(s) Administered   Influenza-Unspecified 03/02/2013, 02/15/2014, 02/16/2015, 02/22/2016, 02/20/2017, 02/23/2018   PPD Test 03/23/2012   Pneumococcal Conjugate-13 12/17/2016   Pneumococcal Polysaccharide-23 09/22/2002   Td 11/17/2002   Tdap 12/16/2016   Pertinent  Health Maintenance Due  Topic Date Due   INFLUENZA VACCINE  12/12/2018   DEXA SCAN  Completed   PNA vac Low Risk Adult  Completed   Fall Risk  12/24/2017 12/13/2016 02/07/2015 02/22/2014 02/22/2014  Falls in the past year? Yes No No Yes No  Number falls in past yr: 1 - - 1 -  Injury with Fall? Yes - - - -  Risk for fall due to : - - - History of fall(s) -   Functional Status Survey:    Vitals:   09/11/18 1226  BP: 108/66  Pulse: 61  Resp: 16  Temp: (!) 97.3 F (36.3 C)  SpO2: 94%  Weight: 134 lb 3.2 oz (60.9 kg)  Height: 5\' 1"  (1.549 m)   Body mass index is 25.36 kg/m. Physical Exam Vitals signs reviewed.  Constitutional:      Appearance: Normal  appearance.  HENT:     Head: Normocephalic.     Comments: Had Staples. Healing well    Nose: Nose normal.     Mouth/Throat:     Mouth: Mucous membranes are moist.     Pharynx: Oropharynx is clear.  Neck:     Musculoskeletal: Neck supple.  Cardiovascular:     Rate and Rhythm: Normal rate and regular rhythm.     Pulses: Normal pulses.     Heart sounds: Normal heart sounds.  Pulmonary:     Effort: Pulmonary effort is normal. No respiratory distress.     Breath sounds: Normal breath sounds. No wheezing or rales.  Abdominal:     General: Abdomen is flat. Bowel sounds are normal.     Palpations: Abdomen is soft.  Musculoskeletal:        General: No swelling.  Skin:    General: Skin is warm and dry.  Neurological:     General: No focal deficit present.     Mental Status: She is alert.     Comments: She mumbles. Does follow  some commands   Psychiatric:        Mood and Affect: Mood normal.        Thought Content: Thought content normal.        Judgment: Judgment normal.     Labs reviewed: Recent Labs    01/19/18 05/21/18  NA 139 139  K 4.1 4.6  CL  --  105  CO2  --  23  BUN 24* 28*  CREATININE 0.66 0.7  CALCIUM 8.8 9.1   Recent Labs    01/19/18 05/21/18  AST 11 14  ALT 10 11  ALKPHOS 37 51  BILITOT 0.6  --   PROT 5.5 6.4  ALBUMIN 3 3.5   Recent Labs    05/21/18  WBC 11.8  HGB 12.9  HCT 38  PLT 223   Lab Results  Component Value Date   TSH 3.45 05/21/2018   Lab Results  Component Value Date   HGBA1C 6.7 07/18/2016   Lab Results  Component Value Date   CHOL 146 01/23/2017   HDL 39 01/23/2017   LDLCALC 76 01/23/2017   TRIG 214 (A) 01/23/2017    Significant Diagnostic Results in last 30 days:  Ct Head Wo Contrast  Result Date: 09/04/2018 CLINICAL DATA:  Fall, head trauma, confusion, neck pain EXAM: CT HEAD WITHOUT CONTRAST CT CERVICAL SPINE WITHOUT CONTRAST TECHNIQUE: Multidetector CT imaging of the head and cervical spine was performed following  the standard protocol without intravenous contrast. Multiplanar CT image reconstructions of the cervical spine were also generated. COMPARISON:  06/15/2018 FINDINGS: CT HEAD FINDINGS Brain: There is atrophy and chronic small vessel disease changes. No acute intracranial abnormality. Specifically, no hemorrhage, hydrocephalus, mass lesion, acute infarction, or significant intracranial injury. Old bilateral cerebellar infarcts. Vascular: No hyperdense vessel or unexpected calcification. Skull: No acute calvarial abnormality. Sinuses/Orbits: Visualized paranasal sinuses and mastoids clear. Orbital soft tissues unremarkable. Other: None CT CERVICAL SPINE FINDINGS Alignment: Normal Skull base and vertebrae: No acute fracture. No primary bone lesion or focal pathologic process. Soft tissues and spinal canal: No prevertebral fluid or swelling. No visible canal hematoma. Disc levels:  Diffuse degenerative disc and facet disease. Upper chest: No acute findings Other: None IMPRESSION: Atrophy, chronic microvascular disease. No acute intracranial abnormality. Diffuse degenerative disc and facet disease. No acute bony abnormality in the cervical spine. Electronically Signed   By: Rolm Baptise M.D.   On: 09/04/2018 21:33   Ct Cervical Spine Wo Contrast  Result Date: 09/04/2018 CLINICAL DATA:  Fall, head trauma, confusion, neck pain EXAM: CT HEAD WITHOUT CONTRAST CT CERVICAL SPINE WITHOUT CONTRAST TECHNIQUE: Multidetector CT imaging of the head and cervical spine was performed following the standard protocol without intravenous contrast. Multiplanar CT image reconstructions of the cervical spine were also generated. COMPARISON:  06/15/2018 FINDINGS: CT HEAD FINDINGS Brain: There is atrophy and chronic small vessel disease changes. No acute intracranial abnormality. Specifically, no hemorrhage, hydrocephalus, mass lesion, acute infarction, or significant intracranial injury. Old bilateral cerebellar infarcts. Vascular: No  hyperdense vessel or unexpected calcification. Skull: No acute calvarial abnormality. Sinuses/Orbits: Visualized paranasal sinuses and mastoids clear. Orbital soft tissues unremarkable. Other: None CT CERVICAL SPINE FINDINGS Alignment: Normal Skull base and vertebrae: No acute fracture. No primary bone lesion or focal pathologic process. Soft tissues and spinal canal: No prevertebral fluid or swelling. No visible canal hematoma. Disc levels:  Diffuse degenerative disc and facet disease. Upper chest: No acute findings Other: None IMPRESSION: Atrophy, chronic microvascular disease. No acute intracranial abnormality. Diffuse degenerative disc and  facet disease. No acute bony abnormality in the cervical spine. Electronically Signed   By: Rolm Baptise M.D.   On: 09/04/2018 21:33   Ct Pelvis Wo Contrast  Result Date: 09/04/2018 CLINICAL DATA:  Fall with neck and pelvic pain.  Confusion. EXAM: CT PELVIS WITHOUT CONTRAST TECHNIQUE: Multidetector CT imaging of the pelvis was performed following the standard protocol without intravenous contrast. COMPARISON:  None recent. One view abdomen 09/11/2013. Pelvic CT 08/14/2013. FINDINGS: Urinary Tract: The visualized distal ureters and bladder appear unremarkable. Bowel: No bowel wall thickening, distention or surrounding inflammation identified within the pelvis. Moderate diverticular changes of the sigmoid colon. Vascular/Lymphatic: No enlarged pelvic lymph nodes identified. Moderate diffuse aortoiliac atherosclerosis. Reproductive: 1.6 cm low-density left adnexal lesion on image 76/3 is similar to the previous study, consistent with a benign finding. No suspicious adnexal findings. The uterus appears normal. Other: There is no ascites or pelvic inflammatory change. Musculoskeletal: The bones appear mildly demineralized. There is no evidence of acute fracture or dislocation. There is lower lumbar spondylosis with mild degenerative changes at the sacroiliac joints. There is  mild osteitis pubis. The hip joint spaces appear relatively preserved. There are subcutaneous calcifications lateral to both hips, greater on the right. There is atrophy of the right gluteus musculature which appears chronic. There is ill-defined enlargement of the right iliacus and iliopsoas muscles. There is mild adjacent soft tissue stranding. No evidence of acute hematoma. IMPRESSION: 1. No evidence of acute pelvic or proximal femur fracture. 2. Ill-defined enlargement of the right iliacus and iliopsoas muscles, suggesting a muscle strain, subacute hemorrhage or bursitis. No evidence of sacroiliitis. 3. Sigmoid diverticulosis without acute inflammation. Electronically Signed   By: Richardean Sale M.D.   On: 09/04/2018 21:41    Assessment/Plan Essential hypertension BP controlled on Norvasc and Cozaar Recurrent Falls She doe shave few episode of bradycardia Will decrease her Metoprolol to 50 mg  Acquired hypothyroidism TSH normal in 01/20 Vascular dementia with behavior disturbance  Continue on Seroquel  Osteoporosis  On Raloxifene  Laceration of scalp Healing well Due for removal tomorrow     Family/ staff Communication:   Labs/tests ordered:   Total time spent in this patient care encounter was  25_  minutes; greater than 50% of the visit spent counseling patient and staff, reviewing records , Labs and coordinating care for problems addressed at this encounter.

## 2018-09-18 ENCOUNTER — Encounter: Payer: Self-pay | Admitting: Family

## 2018-09-18 ENCOUNTER — Non-Acute Institutional Stay (SKILLED_NURSING_FACILITY): Payer: Medicare Other | Admitting: Family

## 2018-09-18 DIAGNOSIS — Y92129 Unspecified place in nursing home as the place of occurrence of the external cause: Secondary | ICD-10-CM

## 2018-09-18 DIAGNOSIS — R2681 Unsteadiness on feet: Secondary | ICD-10-CM

## 2018-09-18 DIAGNOSIS — W19XXXA Unspecified fall, initial encounter: Secondary | ICD-10-CM

## 2018-09-18 NOTE — Progress Notes (Signed)
Location:  [facility] Room Number: 2/A Place of Service:  SNF (31) Provider:  Marlowe Sax  NP  Virgie Dad, MD  Patient Care Team: Virgie Dad, MD as PCP - General (Internal Medicine) Mast, Man X, NP as Nurse Practitioner (Nurse Practitioner) Charlotte Crumb, MD as Consulting Physician (Orthopedic Surgery) Regal, Tamala Fothergill, DPM as Consulting Physician (Podiatry) Gaynelle Arabian, MD as Consulting Physician (Orthopedic Surgery) Zebedee Iba., MD as Referring Physician (Ophthalmology) Newman Pies, MD as Consulting Physician (Neurosurgery) Melina Modena, [facility] (Skilled Nursing and [place]) Mast, Man X, NP as Nurse Practitioner (Internal Medicine) Brodie Scovell, Nelda Bucks, NP as Nurse Practitioner (Family Medicine)  Extended Emergency Contact Information Primary Emergency Contact: Monday,[name] Address: [place], [place] [address] Johnnette Litter of [place] Phone: [phone] Mobile Phone: [phone] Relation: Daughter Secondary Emergency Contact: [place] of [place] Phone: [phone] Relation: Grandson  Code Status:  DNR Goals of care: Advanced Directive information Advanced Directives 09/11/2018  Does Patient Have a Medical Advance Directive? Yes  Type of Paramedic of [place];Out of facility DNR (pink MOST or yellow form)  Does patient want to make changes to medical advance directive? No - Patient declined  Copy of [place] in Chart? Yes - validated most recent copy scanned in chart (See row information)  Pre-existing out of facility DNR order (yellow form or pink MOST form) Yellow form placed in chart (order not valid for inpatient use);Pink MOST form placed in chart (order not valid for inpatient use)     Chief Complaint  Patient presents with   Acute Visit    C/o - fall    HPI:  Pt is a 85 y.o. female seen today for  an acute visit for evaluation of fall.she is seen in her room sitting on recliner.Facility Nurse reports via SBAR that patient was observed sitting on the floor. Nurse states patient seems to have slidd off the bed.Patient was able to move extremities without any difficulties.Her vital signs and neuro checks remain stable.she denies any acute issues during visit.No fever,chills or cough reported.     Past Medical History:  Diagnosis Date   Arthritis    Cataracts, bilateral    Closed fracture of unspecified part of lower end of humerus    Dementia ([place])    Hypertension    Insomnia, unspecified    Macular degeneration (senile) of retina, unspecified    Osteoarthritis    Osteoarthrosis, unspecified whether generalized or localized, unspecified site    Pancreatitis, acute 08/14/2013   Retinal neovascularization NOS    Senile osteoporosis    Subarachnoid hemorrhage following injury ([place]) 03/31/2012   Unspecified hearing loss    Unspecified hypothyroidism    Urinary tract infection 08/10/2013   Vertebral fracture 08/09/2013   T7 10%, T12 70%    Past Surgical History:  Procedure Laterality Date   ABDOMINAL HYSTERECTOMY  1942   ANKLE FRACTURE SURGERY  1960   BLADDER SUSPENSION     CLOSED REDUCTION WITH HUMERAL PIN INSERTION  04/01/2012   Procedure: CLOSED REDUCTION WITH HUMERAL PIN INSERTION;  Surgeon: Schuyler Amor, MD;  Location: [facility];  Service: Orthopedics;  Laterality: Left;    No Known Allergies  Outpatient Encounter Medications as of 09/18/2018  Medication Sig   amLODipine (NORVASC) 10 MG tablet Take 10 mg by mouth daily.   aspirin 81 MG chewable tablet Chew  81 mg by mouth daily.   Cholecalciferol 1000 units capsule Take 2,000 Units by mouth daily.    ketoconazole (NIZORAL) 2 % shampoo Apply 1 application topically every Wednesday. Send shampoo with daughter to beauty shop with resident on Wednesday    levothyroxine (SYNTHROID, LEVOTHROID) 88 MCG  tablet Take 88 mcg by mouth daily before breakfast.   losartan (COZAAR) 100 MG tablet Take 1 tablet (100 mg total) by mouth daily.   metoprolol tartrate (LOPRESSOR) 50 MG tablet Take 50 mg by mouth 2 (two) times daily.   Nutritional Supplements (BOOST GLUCOSE CONTROL) LIQD Take 237 mLs by mouth 2 (two) times daily between meals.   polyethylene glycol powder (GLYCOLAX/MIRALAX) powder Take 17 g by mouth daily. Hold for loose stool   QUEtiapine (SEROQUEL) 25 MG tablet Take 25 mg by mouth at bedtime.   raloxifene (EVISTA) 60 MG tablet Take 60 mg by mouth daily.   [DISCONTINUED] metoprolol (LOPRESSOR) 100 MG tablet Take 100 mg by mouth 2 (two) times daily.    No facility-administered encounter medications on file as of 09/18/2018.     Review of Systems  Unable to perform ROS: Dementia (additional information provided by facility Nurse)    Immunization History  Administered Date(s) Administered   Influenza-Unspecified 03/02/2013, 02/15/2014, 02/16/2015, 02/22/2016, 02/20/2017, 02/23/2018   PPD Test 03/23/2012   Pneumococcal Conjugate-13 12/17/2016   Pneumococcal Polysaccharide-23 09/22/2002   Td 11/17/2002   Tdap 12/16/2016   Pertinent  Health Maintenance Due  Topic Date Due   INFLUENZA VACCINE  12/12/2018   DEXA SCAN  Completed   PNA vac Low Risk Adult  Completed   Fall Risk  12/24/2017 12/13/2016 02/07/2015 02/22/2014 02/22/2014  Falls in the past year? Yes No No Yes No  Number falls in past yr: 1 - - 1 -  Injury with Fall? Yes - - - -  Risk for fall due to : - - - History of fall(s) -    Vitals:   09/18/18 1321  BP: 120/60  Pulse: 88  Resp: (!) 7  Temp: 98.2 F (36.8 C)  SpO2: 91%  Weight: 125 lb 12.8 oz (57.1 kg)  Height: 5\' 1"  (1.549 m)   Body mass index is 23.77 kg/m. Physical Exam Vitals signs and nursing note reviewed.  Constitutional:      General: She is not in acute distress.    Appearance: She is normal weight. She is not ill-appearing.  HENT:       Head: Normocephalic.     Nose: Nose normal. No congestion or rhinorrhea.     Mouth/Throat:     Mouth: Mucous membranes are moist.     Pharynx: Oropharynx is clear. No oropharyngeal exudate or posterior oropharyngeal erythema.  Eyes:     General: No scleral icterus.       Right eye: No discharge.        Left eye: No discharge.     Conjunctiva/sclera: Conjunctivae normal.     Pupils: Pupils are equal, round, and reactive to light.  Neck:     Musculoskeletal: Normal range of motion. No neck rigidity or muscular tenderness.  Cardiovascular:     Rate and Rhythm: Normal rate and regular rhythm.     Pulses: Normal pulses.     Heart sounds: Normal heart sounds. No murmur. No friction rub. No gallop.   Pulmonary:     Effort: Pulmonary effort is normal. No respiratory distress.     Breath sounds: Normal breath sounds. No wheezing, rhonchi or rales.  Chest:     Chest wall: No tenderness.  Abdominal:     General: Bowel sounds are normal. There is no distension.     Palpations: Abdomen is soft. There is no mass.     Tenderness: There is no abdominal tenderness. There is no right CVA tenderness, left CVA tenderness, guarding or rebound.  Musculoskeletal:        General: No swelling or tenderness.     Right lower leg: No edema.     Left lower leg: No edema.     Comments: Moves x 4 extremities without difficulties.unsteady gait ambulates with front wheel walker   Lymphadenopathy:     Cervical: No cervical adenopathy.  Skin:    General: Skin is warm and dry.     Coloration: Skin is not pale.     Findings: No bruising, erythema or rash.  Neurological:     Mental Status: She is alert. Mental status is at baseline.     Cranial Nerves: No cranial nerve deficit.     Sensory: No sensory deficit.     Motor: No weakness.     Coordination: Coordination normal.     Gait: Gait abnormal.  Psychiatric:        Mood and Affect: Mood normal.        Behavior: Behavior normal.        Thought  Content: Thought content normal.        Judgment: Judgment normal.    Labs reviewed: Recent Labs    01/19/18 05/21/18  NA 139 139  K 4.1 4.6  CL  --  105  CO2  --  23  BUN 24* 28*  CREATININE 0.66 0.7  CALCIUM 8.8 9.1   Recent Labs    01/19/18 05/21/18  AST 11 14  ALT 10 11  ALKPHOS 37 51  BILITOT 0.6  --   PROT 5.5 6.4  ALBUMIN 3 3.5   Recent Labs    05/21/18  WBC 11.8  HGB 12.9  HCT 38  PLT 223   Lab Results  Component Value Date   TSH 3.45 05/21/2018   Lab Results  Component Value Date   HGBA1C 6.7 07/18/2016   Lab Results  Component Value Date   CHOL 146 01/23/2017   HDL 39 01/23/2017   LDLCALC 76 01/23/2017   TRIG 214 (A) 01/23/2017    Significant Diagnostic Results in last 30 days:  Ct Head Wo Contrast  Result Date: 09/04/2018 CLINICAL DATA:  Fall, head trauma, confusion, neck pain EXAM: CT HEAD WITHOUT CONTRAST CT CERVICAL SPINE WITHOUT CONTRAST TECHNIQUE: Multidetector CT imaging of the head and cervical spine was performed following the standard protocol without intravenous contrast. Multiplanar CT image reconstructions of the cervical spine were also generated. COMPARISON:  06/15/2018 FINDINGS: CT HEAD FINDINGS Brain: There is atrophy and chronic small vessel disease changes. No acute intracranial abnormality. Specifically, no hemorrhage, hydrocephalus, mass lesion, acute infarction, or significant intracranial injury. Old bilateral cerebellar infarcts. Vascular: No hyperdense vessel or unexpected calcification. Skull: No acute calvarial abnormality. Sinuses/Orbits: Visualized paranasal sinuses and mastoids clear. Orbital soft tissues unremarkable. Other: None CT CERVICAL SPINE FINDINGS Alignment: Normal Skull base and vertebrae: No acute fracture. No primary bone lesion or focal pathologic process. Soft tissues and spinal canal: No prevertebral fluid or swelling. No visible canal hematoma. Disc levels:  Diffuse degenerative disc and facet disease. Upper  chest: No acute findings Other: None IMPRESSION: Atrophy, chronic microvascular disease. No acute intracranial abnormality. Diffuse degenerative disc and facet  disease. No acute bony abnormality in the cervical spine. Electronically Signed   By: Rolm Baptise M.D.   On: 09/04/2018 21:33   Ct Cervical Spine Wo Contrast  Result Date: 09/04/2018 CLINICAL DATA:  Fall, head trauma, confusion, neck pain EXAM: CT HEAD WITHOUT CONTRAST CT CERVICAL SPINE WITHOUT CONTRAST TECHNIQUE: Multidetector CT imaging of the head and cervical spine was performed following the standard protocol without intravenous contrast. Multiplanar CT image reconstructions of the cervical spine were also generated. COMPARISON:  06/15/2018 FINDINGS: CT HEAD FINDINGS Brain: There is atrophy and chronic small vessel disease changes. No acute intracranial abnormality. Specifically, no hemorrhage, hydrocephalus, mass lesion, acute infarction, or significant intracranial injury. Old bilateral cerebellar infarcts. Vascular: No hyperdense vessel or unexpected calcification. Skull: No acute calvarial abnormality. Sinuses/Orbits: Visualized paranasal sinuses and mastoids clear. Orbital soft tissues unremarkable. Other: None CT CERVICAL SPINE FINDINGS Alignment: Normal Skull base and vertebrae: No acute fracture. No primary bone lesion or focal pathologic process. Soft tissues and spinal canal: No prevertebral fluid or swelling. No visible canal hematoma. Disc levels:  Diffuse degenerative disc and facet disease. Upper chest: No acute findings Other: None IMPRESSION: Atrophy, chronic microvascular disease. No acute intracranial abnormality. Diffuse degenerative disc and facet disease. No acute bony abnormality in the cervical spine. Electronically Signed   By: Rolm Baptise M.D.   On: 09/04/2018 21:33   Ct Pelvis Wo Contrast  Result Date: 09/04/2018 CLINICAL DATA:  Fall with neck and pelvic pain.  Confusion. EXAM: CT PELVIS WITHOUT CONTRAST TECHNIQUE:  Multidetector CT imaging of the pelvis was performed following the standard protocol without intravenous contrast. COMPARISON:  None recent. One view abdomen 09/11/2013. Pelvic CT 08/14/2013. FINDINGS: Urinary Tract: The visualized distal ureters and bladder appear unremarkable. Bowel: No bowel wall thickening, distention or surrounding inflammation identified within the pelvis. Moderate diverticular changes of the sigmoid colon. Vascular/Lymphatic: No enlarged pelvic lymph nodes identified. Moderate diffuse aortoiliac atherosclerosis. Reproductive: 1.6 cm low-density left adnexal lesion on image 76/3 is similar to the previous study, consistent with a benign finding. No suspicious adnexal findings. The uterus appears normal. Other: There is no ascites or pelvic inflammatory change. Musculoskeletal: The bones appear mildly demineralized. There is no evidence of acute fracture or dislocation. There is lower lumbar spondylosis with mild degenerative changes at the sacroiliac joints. There is mild osteitis pubis. The hip joint spaces appear relatively preserved. There are subcutaneous calcifications lateral to both hips, greater on the right. There is atrophy of the right gluteus musculature which appears chronic. There is ill-defined enlargement of the right iliacus and iliopsoas muscles. There is mild adjacent soft tissue stranding. No evidence of acute hematoma. IMPRESSION: 1. No evidence of acute pelvic or proximal femur fracture. 2. Ill-defined enlargement of the right iliacus and iliopsoas muscles, suggesting a muscle strain, subacute hemorrhage or bursitis. No evidence of sacroiliitis. 3. Sigmoid diverticulosis without acute inflammation. Electronically Signed   By: Richardean Sale M.D.   On: 09/04/2018 21:41    Assessment/Plan  1. Unsteady gait Afebrile.continues to ambulate with walker.currently working with Physical therapy for gait stability,ROM,exercise and muscle strengthening.continue on fall safety  and precaution.   2. Fall at nursing home, initial encounter Status post multiple falls.vital signs and Neuro checks are stable.she remains high risk for falls due to her advance age and poor safety awareness.continue with therapy as above.  Family/ staff Communication: Reviewed plan of care with patient and facility Nurse.   Labs/tests ordered:  None

## 2018-10-12 ENCOUNTER — Non-Acute Institutional Stay (SKILLED_NURSING_FACILITY): Payer: Medicare Other | Admitting: Family

## 2018-10-12 ENCOUNTER — Encounter: Payer: Self-pay | Admitting: Family

## 2018-10-12 DIAGNOSIS — R630 Anorexia: Secondary | ICD-10-CM | POA: Diagnosis not present

## 2018-10-12 DIAGNOSIS — F01518 Vascular dementia, unspecified severity, with other behavioral disturbance: Secondary | ICD-10-CM

## 2018-10-12 DIAGNOSIS — R634 Abnormal weight loss: Secondary | ICD-10-CM

## 2018-10-12 DIAGNOSIS — E039 Hypothyroidism, unspecified: Secondary | ICD-10-CM | POA: Diagnosis not present

## 2018-10-12 DIAGNOSIS — F0151 Vascular dementia with behavioral disturbance: Secondary | ICD-10-CM

## 2018-10-12 DIAGNOSIS — I10 Essential (primary) hypertension: Secondary | ICD-10-CM | POA: Diagnosis not present

## 2018-10-12 NOTE — Progress Notes (Signed)
Location:  [facility] Room Number: 2A Place of Service:  SNF [phone]) Provider:   FNP-C   Virgie Dad, MD  Patient Care Team: Virgie Dad, MD as PCP - General (Internal Medicine) Mast, Man X, NP as Nurse Practitioner (Nurse Practitioner) Charlotte Crumb, MD as Consulting Physician (Orthopedic Surgery) Regal, Tamala Fothergill, DPM as Consulting Physician (Podiatry) Gaynelle Arabian, MD as Consulting Physician (Orthopedic Surgery) Zebedee Iba., MD as Referring Physician (Ophthalmology) Newman Pies, MD as Consulting Physician (Neurosurgery) Melina Modena, [facility] (Skilled Nursing and [place]) Mast, Man X, NP as Nurse Practitioner (Internal Medicine) , Nelda Bucks, NP as Nurse Practitioner (Family Medicine)  Extended Emergency Contact Information Primary Emergency Contact: Braun,[name] Address: [place], [place] [address] Johnnette Litter of [place] Phone: [phone] Mobile Phone: [phone] Relation: Daughter Secondary Emergency Contact: [place] of [place] Phone: [phone] Relation: Grandson  Code Status: DNR  Goals of care: Advanced Directive information Advanced Directives 10/12/2018  Does Patient Have a Medical Advance Directive? Yes  Type of Paramedic of [place];Living will;Out of facility DNR (pink MOST or yellow form)  Does patient want to make changes to medical advance directive? No - Patient declined  Copy of [place] in Chart? -  Pre-existing out of facility DNR order (yellow form or pink MOST form) Yellow form placed in chart (order not valid for inpatient use);Pink MOST form placed in chart (order not valid for inpatient use)     Chief Complaint  Patient presents with  . Medical Management of Chronic Issues    Routine Visit    HPI:  Pt is a 85 y.o. female seen today [place] for medical  management of chronic diseases. She has a medical history of Hypertension,hx CVD,Hypothyroidism,osteoarthritis,Osteoporosis,AMD,Vascular Dementia with behavioral disturbance among other conditions.she is seen in her room today resting on her recliner.Facility Nurse present at bedside.Nurse states patient was combative in the past week but has been calm for the past five days.she refuses to eat or take her medication sometimes.Per Nurse she also refuses to drink her protein supplements.  Her weight log reviewed has had a 15.9 lbs weight loss over two months Wt 134.2 lbs (08/14/2018); Wt 125.8 lbs (09/17/2018); Wt 118.3 lbs (10/08/2018).No recent fall episode.she continues to work with PT/OT 3 X per week for transfers,gait training,muscle strengthening and safety awareness.     Past Medical History:  Diagnosis Date  . Arthritis   . Cataracts, bilateral   . Closed fracture of unspecified part of lower end of humerus   . Dementia ([place])   . Hypertension   . Insomnia, unspecified   . Macular degeneration (senile) of retina, unspecified   . Osteoarthritis   . Osteoarthrosis, unspecified whether generalized or localized, unspecified site   . Pancreatitis, acute 08/14/2013  . Retinal neovascularization NOS   . Senile osteoporosis   . Subarachnoid hemorrhage following injury ([place]) 03/31/2012  . Unspecified hearing loss   . Unspecified hypothyroidism   . Urinary tract infection 08/10/2013  . Vertebral fracture 08/09/2013   T7 10%, T12 70%    Past Surgical History:  Procedure Laterality Date  . ABDOMINAL HYSTERECTOMY  1942  . ANKLE FRACTURE SURGERY  1960  . BLADDER SUSPENSION    . CLOSED REDUCTION WITH HUMERAL PIN INSERTION  04/01/2012   Procedure: CLOSED REDUCTION WITH HUMERAL PIN INSERTION;  Surgeon: Schuyler Amor, MD;  Location: [facility];  Service: Orthopedics;  Laterality: Left;    No Known Allergies  Allergies as of 10/12/2018   No Known Allergies     Medication List       Accurate as of  October 12, 2018 12:02 PM. If you have any questions, ask your nurse or doctor.        amLODipine 10 MG tablet Commonly known as:  NORVASC Take 10 mg by mouth daily.   aspirin 81 MG chewable tablet Chew 81 mg by mouth daily.   Boost Glucose Control Liqd Take 237 mLs by mouth 2 (two) times daily between meals.   Cholecalciferol 25 MCG (1000 UT) capsule Take 2,000 Units by mouth daily.   ketoconazole 2 % shampoo Commonly known as:  NIZORAL Apply 1 application topically every Wednesday. Send shampoo with daughter to beauty shop with resident on Wednesday   levothyroxine 88 MCG tablet Commonly known as:  SYNTHROID Take 88 mcg by mouth daily before breakfast.   losartan 100 MG tablet Commonly known as:  COZAAR Take 1 tablet (100 mg total) by mouth daily.   metoprolol tartrate 50 MG tablet Commonly known as:  LOPRESSOR Take 50 mg by mouth 2 (two) times daily.   polyethylene glycol powder 17 GM/SCOOP powder Commonly known as:  GLYCOLAX/MIRALAX Take 17 g by mouth daily. Hold for loose stool   QUEtiapine 25 MG tablet Commonly known as:  SEROQUEL Take 25 mg by mouth at bedtime.   raloxifene 60 MG tablet Commonly known as:  EVISTA Take 60 mg by mouth daily.       Review of Systems  Unable to perform ROS: Dementia (additional information provided by facility Nurse )    Immunization History  Administered Date(s) Administered  . Influenza-Unspecified 03/02/2013, 02/15/2014, 02/16/2015, 02/22/2016, 02/20/2017, 02/23/2018  . PPD Test 03/23/2012  . Pneumococcal Conjugate-13 12/17/2016  . Pneumococcal Polysaccharide-23 09/22/2002  . Td 11/17/2002  . Tdap 12/16/2016   Pertinent  Health Maintenance Due  Topic Date Due  . INFLUENZA VACCINE  12/12/2018  . DEXA SCAN  Completed  . PNA vac Low Risk Adult  Completed   Fall Risk  12/24/2017 12/13/2016 02/07/2015 02/22/2014 02/22/2014  Falls in the past year? Yes No No Yes No  Number falls in past yr: 1 - - 1 -  Injury with Fall?  Yes - - - -  Risk for fall due to : - - - History of fall(s) -    Vitals:   10/12/18 0952  BP: 127/71  Pulse: 60  Resp: 18  Temp: (!) 97.1 F (36.2 C)  TempSrc: Oral  SpO2: 95%  Weight: 118 lb 4.8 oz (53.7 kg)  Height: 5\' 1"  (1.549 m)   Body mass index is 22.35 kg/m. Physical Exam Constitutional:      General: She is not in acute distress.    Appearance: She is normal weight. She is not ill-appearing.  HENT:     Head: Normocephalic.     Ears:     Comments: Unable to assess both ears patient refused.    Nose: No congestion or rhinorrhea.     Mouth/Throat:     Mouth: Mucous membranes are moist.  Eyes:     General: No scleral icterus.       Right eye: No discharge.        Left eye: No discharge.     Conjunctiva/sclera: Conjunctivae normal.     Pupils: Pupils are equal, round, and reactive to light.  Neck:  Musculoskeletal: Normal range of motion. No neck rigidity or muscular tenderness.  Cardiovascular:     Rate and Rhythm: Normal rate and regular rhythm.     Pulses: Normal pulses.     Heart sounds: Normal heart sounds. No murmur. No friction rub. No gallop.   Pulmonary:     Effort: Pulmonary effort is normal. No respiratory distress.     Breath sounds: Normal breath sounds. No wheezing, rhonchi or rales.  Chest:     Chest wall: No tenderness.  Abdominal:     General: Bowel sounds are normal. There is no distension.     Palpations: Abdomen is soft. There is no mass.     Tenderness: There is no abdominal tenderness. There is no right CVA tenderness, left CVA tenderness, guarding or rebound.  Genitourinary:    Comments: Incontinent Musculoskeletal:        General: No swelling or tenderness.     Comments: Moves x 4 extremities unsteady gait walks with walker.   Lymphadenopathy:     Cervical: No cervical adenopathy.  Skin:    General: Skin is warm and dry.     Coloration: Skin is not pale.     Findings: No bruising, erythema or rash.     Comments: Left forearm  skin tear steri strips  Intact.No signs of infections.  Neurological:     Mental Status: She is alert. Mental status is at baseline.     Sensory: No sensory deficit.     Motor: No weakness.     Gait: Gait abnormal.  Psychiatric:        Mood and Affect: Mood normal.        Speech: Speech normal.        Behavior: Behavior is uncooperative.        Cognition and Memory: Cognition is impaired. Memory is impaired.     Labs reviewed: Recent Labs    01/19/18 05/21/18 09/01/18  NA 139 139 139  K 4.1 4.6 4.2  CL  --  105  --   CO2  --  23  --   BUN 24* 28* 28*  CREATININE 0.66 0.7 0.7  CALCIUM 8.8 9.1  --    Recent Labs    01/19/18 05/21/18  AST 11 14  ALT 10 11  ALKPHOS 37 51  BILITOT 0.6  --   PROT 5.5 6.4  ALBUMIN 3 3.5   Recent Labs    05/21/18 09/01/18 09/07/18  WBC 11.8 14.6 9.0  HGB 12.9 13.4 12.4  HCT 38 39 36  PLT 223 201 240   Lab Results  Component Value Date   TSH 3.45 05/21/2018   Lab Results  Component Value Date   HGBA1C 6.7 07/18/2016   Lab Results  Component Value Date   CHOL 146 01/23/2017   HDL 39 01/23/2017   LDLCALC 76 01/23/2017   TRIG 214 (A) 01/23/2017    Significant Diagnostic Results in last 30 days:  No results found.  Assessment/Plan 1. Weight loss, abnormal Has had a 15.9 lbs over two months.Refuses meals and supplements. Start on Remeron 7.5 mg tablet one by mouth daily at bedtime for appetite stimulant. Continue to monitor weight and follow up with Registered Dietician.  - TSH level 10/15/2018   2. Poor appetite Refuses meals and protein supplements.Start Remeron as above and follow up with RD services.   3. Essential hypertension Available B/p readings reviewed stable.Refuses for blood pressure check sometimes.continue on Amlodipine 10 mg Tablet daily,losartan 100 mg tablet  daily and metoprolol 50 mg tablet twice daily.  CR at baseline.   4. Acquired hypothyroidism Lab Results  Component Value Date   TSH 3.45 05/21/2018   Continue on levothyroxine 88 mcg Tablet daily on empty stomach. - TSH level 10/15/2018   5. Vascular dementia with behavior disturbance ([place]) Progressive decline expected due to advance age.comapative and refuses meals/medication sometimes.continue on Seroquel 25 mg tablet at bedtime.continue with supportive care.   Family/ staff Communication: Reviewed plan of care with patient and facility Nurse.    Labs/tests ordered: - TSH level 10/15/2018   Sandrea Hughs, NP

## 2018-11-05 ENCOUNTER — Non-Acute Institutional Stay (SKILLED_NURSING_FACILITY): Payer: Medicare Other | Admitting: Internal Medicine

## 2018-11-05 ENCOUNTER — Encounter: Payer: Self-pay | Admitting: Internal Medicine

## 2018-11-05 DIAGNOSIS — I10 Essential (primary) hypertension: Secondary | ICD-10-CM | POA: Diagnosis not present

## 2018-11-05 DIAGNOSIS — M8000XS Age-related osteoporosis with current pathological fracture, unspecified site, sequela: Secondary | ICD-10-CM

## 2018-11-05 DIAGNOSIS — F0151 Vascular dementia with behavioral disturbance: Secondary | ICD-10-CM | POA: Diagnosis not present

## 2018-11-05 DIAGNOSIS — F01518 Vascular dementia, unspecified severity, with other behavioral disturbance: Secondary | ICD-10-CM

## 2018-11-05 NOTE — Progress Notes (Signed)
Location:  [facility] Room Number: 2 Place of Service:  SNF [phone]) Provider:  Dr. Clydene Fake, MD  Patient Care Team: Virgie Dad, MD as PCP - General (Internal Medicine) Mast, Man X, NP as Nurse Practitioner (Nurse Practitioner) Charlotte Crumb, MD as Consulting Physician (Orthopedic Surgery) Regal, Tamala Fothergill, DPM as Consulting Physician (Podiatry) Gaynelle Arabian, MD as Consulting Physician (Orthopedic Surgery) Zebedee Iba., MD as Referring Physician (Ophthalmology) Newman Pies, MD as Consulting Physician (Neurosurgery) Melina Modena, [facility] (Skilled Nursing and [place]) Mast, Man X, NP as Nurse Practitioner (Internal Medicine) Ngetich, Nelda Bucks, NP as Nurse Practitioner (Family Medicine)  Extended Emergency Contact Information Primary Emergency Contact: Silveria,[name] Address: [place], [place] [address] Johnnette Litter of [place] Phone: [phone] Mobile Phone: [phone] Relation: Daughter Secondary Emergency Contact: [place] of [place] Phone: [phone] Relation: Grandson  Code Status:  DNR Goals of care: Advanced Directive information Advanced Directives 11/05/2018  Does Patient Have a Medical Advance Directive? Yes  Type of Advance Directive Out of facility DNR (pink MOST or yellow form);Healthcare Power of Attorney  Does patient want to make changes to medical advance directive? No - Patient declined  Copy of [place] in Chart? Yes - validated most recent copy scanned in chart (See row information)  Pre-existing out of facility DNR order (yellow form or pink MOST form) Yellow form placed in chart (order not valid for inpatient use);Pink MOST form placed in chart (order not valid for inpatient use)     Chief Complaint  Patient presents with  . Acute Visit    Skin tear and Behavior Issues     HPI:  Pt is a 85 y.o.  female seen today for an acute visit for Skin Tear, and behavior Issues including Resisting Care  Patient has h/o Hypertension, Hypothyroidism, Dementia with Behavior issues and Osteoporosis Patient sustained Skin tear when CNA were trying to change her.She was resisting care and hit her hand to the Rail Patient is not able to give any history. Was calmer today. Per nurses she does not responds to redirection sometimes and gets Harder to provide care  Past Medical History:  Diagnosis Date  . Arthritis   . Cataracts, bilateral   . Closed fracture of unspecified part of lower end of humerus   . Dementia ([place])   . Hypertension   . Insomnia, unspecified   . Macular degeneration (senile) of retina, unspecified   . Osteoarthritis   . Osteoarthrosis, unspecified whether generalized or localized, unspecified site   . Pancreatitis, acute 08/14/2013  . Retinal neovascularization NOS   . Senile osteoporosis   . Subarachnoid hemorrhage following injury ([place]) 03/31/2012  . Unspecified hearing loss   . Unspecified hypothyroidism   . Urinary tract infection 08/10/2013  . Vertebral fracture 08/09/2013   T7 10%, T12 70%    Past Surgical History:  Procedure Laterality Date  . ABDOMINAL HYSTERECTOMY  1942  . ANKLE FRACTURE SURGERY  1960  . BLADDER SUSPENSION    . CLOSED REDUCTION WITH HUMERAL PIN INSERTION  04/01/2012   Procedure: CLOSED REDUCTION WITH HUMERAL PIN INSERTION;  Surgeon: Schuyler Amor, MD;  Location: [facility];  Service: Orthopedics;  Laterality: Left;    No Known Allergies  Outpatient Encounter Medications as of 11/05/2018  Medication Sig  . amLODipine (NORVASC) 10 MG tablet Take 10 mg by mouth  daily.  . aspirin 81 MG chewable tablet Chew 81 mg by mouth daily.  . Cholecalciferol 1000 units capsule Take 2,000 Units by mouth daily.   Marland Kitchen ketoconazole (NIZORAL) 2 % shampoo Apply 1 application topically every Wednesday. Send shampoo with daughter to beauty shop with resident on  Wednesday   . levothyroxine (SYNTHROID, LEVOTHROID) 88 MCG tablet Take 88 mcg by mouth daily before breakfast.  . losartan (COZAAR) 100 MG tablet Take 1 tablet (100 mg total) by mouth daily.  . metoprolol tartrate (LOPRESSOR) 50 MG tablet Take 50 mg by mouth 2 (two) times daily.  . mirtazapine (REMERON) 7.5 MG tablet Take 7.5 mg by mouth at bedtime.  . Nutritional Supplements (BOOST GLUCOSE CONTROL) LIQD Take 237 mLs by mouth 2 (two) times daily between meals.  . polyethylene glycol powder (GLYCOLAX/MIRALAX) powder Take 17 g by mouth daily. Hold for loose stool  . QUEtiapine (SEROQUEL) 25 MG tablet Take 25 mg by mouth at bedtime.  . raloxifene (EVISTA) 60 MG tablet Take 60 mg by mouth daily.   No facility-administered encounter medications on file as of 11/05/2018.     Review of Systems  Unable to perform ROS: Dementia    Immunization History  Administered Date(s) Administered  . Influenza-Unspecified 03/02/2013, 02/15/2014, 02/16/2015, 02/22/2016, 02/20/2017, 02/23/2018  . PPD Test 03/23/2012  . Pneumococcal Conjugate-13 12/17/2016  . Pneumococcal Polysaccharide-23 09/22/2002  . Td 11/17/2002  . Tdap 12/16/2016   Pertinent  Health Maintenance Due  Topic Date Due  . INFLUENZA VACCINE  12/12/2018  . DEXA SCAN  Completed  . PNA vac Low Risk Adult  Completed   Fall Risk  12/24/2017 12/13/2016 02/07/2015 02/22/2014 02/22/2014  Falls in the past year? Yes No No Yes No  Number falls in past yr: 1 - - 1 -  Injury with Fall? Yes - - - -  Risk for fall due to : - - - History of fall(s) -   Functional Status Survey:    Vitals:   11/05/18 1013  Pulse: 81  Resp: 18  Temp: (!) 97.3 F (36.3 C)  TempSrc: Oral  SpO2: 98%  Weight: 118 lb 9.6 oz (53.8 kg)  Height: 5\' 1"  (1.549 m)   Body mass index is 22.41 kg/m. Physical Exam Vitals signs reviewed.  Constitutional:      Appearance: Normal appearance.  HENT:     Head: Normocephalic.     Nose: Nose normal.     Mouth/Throat:      Mouth: Mucous membranes are moist.     Pharynx: Oropharynx is clear.  Eyes:     Pupils: Pupils are equal, round, and reactive to light.  Neck:     Musculoskeletal: Neck supple.  Cardiovascular:     Rate and Rhythm: Normal rate and regular rhythm.     Pulses: Normal pulses.  Pulmonary:     Effort: Pulmonary effort is normal.     Breath sounds: Normal breath sounds.  Abdominal:     General: Abdomen is flat. Bowel sounds are normal.     Palpations: Abdomen is soft.  Musculoskeletal:        General: No swelling.  Skin:    General: Skin is warm.     Comments: Has Big Skin tear on Left Hand. No Redness or discharge  Neurological:     General: No focal deficit present.     Mental Status: She is alert.  Psychiatric:     Comments: Does not follow any commands     Labs reviewed:  Recent Labs    01/19/18 05/21/18 09/01/18  NA 139 139 139  K 4.1 4.6 4.2  CL  --  105  --   CO2  --  23  --   BUN 24* 28* 28*  CREATININE 0.66 0.7 0.7  CALCIUM 8.8 9.1  --    Recent Labs    01/19/18 05/21/18  AST 11 14  ALT 10 11  ALKPHOS 37 51  BILITOT 0.6  --   PROT 5.5 6.4  ALBUMIN 3 3.5   Recent Labs    05/21/18 09/01/18 09/07/18  WBC 11.8 14.6 9.0  HGB 12.9 13.4 12.4  HCT 38 39 36  PLT 223 201 240   Lab Results  Component Value Date   TSH 3.45 05/21/2018   Lab Results  Component Value Date   HGBA1C 6.7 07/18/2016   Lab Results  Component Value Date   CHOL 146 01/23/2017   HDL 39 01/23/2017   LDLCALC 76 01/23/2017   TRIG 214 (A) 01/23/2017    Significant Diagnostic Results in last 30 days:    Assessment/Plan Dementia With Behavior issues and Skin Tear D/W the Nurses Patient unfortunately resists everything including cannot take BP She also did not allow [place] to do Covid testing on her. Will continue Seroquel no Change Try to Leave her when she is resisting care Continue Antibiotics Topical with Dressing Essential hypertension BP controlled on Norvasc and Cozaar and  Metoprolol Acquired hypothyroidism TSH normal in 01/20 Osteoporosis On Evista Weight Loss Was started on Remeron Recently Family/ staff Communication:   Labs/tests ordered:   Total time spent in this patient care encounter was  25_  minutes; greater than 50% of the visit spent counseling staff, reviewing records , Labs and coordinating care for problems addressed at this encounter.

## 2018-11-23 ENCOUNTER — Non-Acute Institutional Stay (SKILLED_NURSING_FACILITY): Payer: Medicare Other | Admitting: Internal Medicine

## 2018-11-23 ENCOUNTER — Encounter: Payer: Self-pay | Admitting: Internal Medicine

## 2018-11-23 DIAGNOSIS — E039 Hypothyroidism, unspecified: Secondary | ICD-10-CM | POA: Diagnosis not present

## 2018-11-23 DIAGNOSIS — I10 Essential (primary) hypertension: Secondary | ICD-10-CM | POA: Diagnosis not present

## 2018-11-23 DIAGNOSIS — F01518 Vascular dementia, unspecified severity, with other behavioral disturbance: Secondary | ICD-10-CM

## 2018-11-23 DIAGNOSIS — Z8673 Personal history of transient ischemic attack (TIA), and cerebral infarction without residual deficits: Secondary | ICD-10-CM

## 2018-11-23 DIAGNOSIS — F0151 Vascular dementia with behavioral disturbance: Secondary | ICD-10-CM | POA: Diagnosis not present

## 2018-11-23 NOTE — Progress Notes (Signed)
Location:  [facility] Room Number: 2 Place of Service:  SNF (31) Provider:Gupta Anjali L,MD     Virgie Dad, MD  Patient Care Team: Virgie Dad, MD as PCP - General (Internal Medicine) Mast, Man X, NP as Nurse Practitioner (Nurse Practitioner) Charlotte Crumb, MD as Consulting Physician (Orthopedic Surgery) Regal, Tamala Fothergill, DPM as Consulting Physician (Podiatry) Gaynelle Arabian, MD as Consulting Physician (Orthopedic Surgery) Zebedee Iba., MD as Referring Physician (Ophthalmology) Newman Pies, MD as Consulting Physician (Neurosurgery) Melina Modena, [facility] (Skilled Nursing and [place]) Mast, Man X, NP as Nurse Practitioner (Internal Medicine) Ngetich, Nelda Bucks, NP as Nurse Practitioner (Family Medicine)  Extended Emergency Contact Information Primary Emergency Contact: Somoza,[name] Address: [place], [place] [address] Johnnette Litter of [place] Phone: [phone] Mobile Phone: [phone] Relation: Daughter Secondary Emergency Contact: [place] of [place] Phone: [phone] Relation: Grandson  Code Status:  DNR  Goals of care: Advanced Directive information Advanced Directives 11/23/2018  Does Patient Have a Medical Advance Directive? Yes  Type of Paramedic of [place];Out of facility DNR (pink MOST or yellow form)  Does patient want to make changes to medical advance directive? No - Patient declined  Copy of [place] in Chart? Yes - validated most recent copy scanned in chart (See row information)  Pre-existing out of facility DNR order (yellow form or pink MOST form) Yellow form placed in chart (order not valid for inpatient use);Pink MOST form placed in chart (order not valid for inpatient use)     Chief Complaint  Patient presents with  . Medical Management of Chronic Issues    routine visit     HPI:  Pt is a  85 y.o. female seen today for medical management of chronic diseases.   Patient has h/o Hypertension, Hypothyroidism, Dementia with Behavior issues and Osteoporosis Patient continues to have issues with resisting care and recurrent falls.  She continues to have skin tears and bruises.  She has advanced dementia and is unable to give any history.  Per nurses her appetite is fair and her weight has stabilized. No other issues.  Past Medical History:  Diagnosis Date  . Arthritis   . Cataracts, bilateral   . Closed fracture of unspecified part of lower end of humerus   . Dementia ([place])   . Hypertension   . Insomnia, unspecified   . Macular degeneration (senile) of retina, unspecified   . Osteoarthritis   . Osteoarthrosis, unspecified whether generalized or localized, unspecified site   . Pancreatitis, acute 08/14/2013  . Retinal neovascularization NOS   . Senile osteoporosis   . Subarachnoid hemorrhage following injury ([place]) 03/31/2012  . Unspecified hearing loss   . Unspecified hypothyroidism   . Urinary tract infection 08/10/2013  . Vertebral fracture 08/09/2013   T7 10%, T12 70%    Past Surgical History:  Procedure Laterality Date  . ABDOMINAL HYSTERECTOMY  1942  . ANKLE FRACTURE SURGERY  1960  . BLADDER SUSPENSION    . CLOSED REDUCTION WITH HUMERAL PIN INSERTION  04/01/2012   Procedure: CLOSED REDUCTION WITH HUMERAL PIN INSERTION;  Surgeon: Schuyler Amor, MD;  Location: [facility];  Service: Orthopedics;  Laterality: Left;    No Known Allergies  Outpatient Encounter Medications as of 11/23/2018  Medication Sig  . amLODipine (NORVASC) 10 MG tablet Take 10 mg by mouth daily.  Marland Kitchen aspirin 81  MG chewable tablet Chew 81 mg by mouth daily.  . Cholecalciferol 1000 units capsule Take 2,000 Units by mouth daily.   Marland Kitchen ketoconazole (NIZORAL) 2 % shampoo Apply 1 application topically every Wednesday. Send shampoo with daughter to beauty shop with resident on Wednesday   . levothyroxine  (SYNTHROID, LEVOTHROID) 88 MCG tablet Take 88 mcg by mouth daily before breakfast.  . losartan (COZAAR) 100 MG tablet Take 1 tablet (100 mg total) by mouth daily.  . metoprolol tartrate (LOPRESSOR) 50 MG tablet Take 50 mg by mouth 2 (two) times daily.  . mirtazapine (REMERON) 7.5 MG tablet Take 7.5 mg by mouth at bedtime.  . Nutritional Supplements (BOOST GLUCOSE CONTROL) LIQD Take 237 mLs by mouth 2 (two) times daily between meals.  . polyethylene glycol powder (GLYCOLAX/MIRALAX) powder Take 17 g by mouth daily. Hold for loose stool  . QUEtiapine (SEROQUEL) 25 MG tablet Take 25 mg by mouth at bedtime.  . raloxifene (EVISTA) 60 MG tablet Take 60 mg by mouth daily.   No facility-administered encounter medications on file as of 11/23/2018.     Review of Systems  Unable to perform ROS: Dementia    Immunization History  Administered Date(s) Administered  . Influenza-Unspecified 03/02/2013, 02/15/2014, 02/16/2015, 02/22/2016, 02/20/2017, 02/23/2018  . PPD Test 03/23/2012  . Pneumococcal Conjugate-13 12/17/2016  . Pneumococcal Polysaccharide-23 09/22/2002  . Td 11/17/2002  . Tdap 12/16/2016   Pertinent  Health Maintenance Due  Topic Date Due  . INFLUENZA VACCINE  12/12/2018  . DEXA SCAN  Completed  . PNA vac Low Risk Adult  Completed   Fall Risk  12/24/2017 12/13/2016 02/07/2015 02/22/2014 02/22/2014  Falls in the past year? Yes No No Yes No  Number falls in past yr: 1 - - 1 -  Injury with Fall? Yes - - - -  Risk for fall due to : - - - History of fall(s) -   Functional Status Survey:    Vitals:   11/23/18 1452  BP: (!) 144/77  Pulse: 91  Resp: 19  Temp: (!) 97.3 F (36.3 C)  SpO2: 90%  Weight: 117 lb 9.6 oz (53.3 kg)  Height: 5\' 1"  (1.549 m)   Body mass index is 22.22 kg/m. Physical Exam Vitals signs reviewed.  Constitutional:      Appearance: Normal appearance.  HENT:     Head: Normocephalic.     Nose: Nose normal.     Mouth/Throat:     Mouth: Mucous membranes are  moist.     Pharynx: Oropharynx is clear.  Neck:     Musculoskeletal: Neck supple.  Cardiovascular:     Rate and Rhythm: Normal rate and regular rhythm.     Pulses: Normal pulses.  Pulmonary:     Effort: Pulmonary effort is normal.     Breath sounds: Normal breath sounds.  Abdominal:     General: Abdomen is flat. Bowel sounds are normal.     Palpations: Abdomen is soft.  Musculoskeletal:     Comments: Chronic Venous Changes Bilateral  Neurological:     General: No focal deficit present.     Mental Status: She is alert.     Comments: Mumbles. Does not Follow Commands  Psychiatric:        Mood and Affect: Mood normal.        Thought Content: Thought content normal.        Judgment: Judgment normal.     Labs reviewed: Recent Labs    01/19/18 05/21/18 09/01/18  NA 139  139 139  K 4.1 4.6 4.2  CL  --  105  --   CO2  --  23  --   BUN 24* 28* 28*  CREATININE 0.66 0.7 0.7  CALCIUM 8.8 9.1  --    Recent Labs    01/19/18 05/21/18  AST 11 14  ALT 10 11  ALKPHOS 37 51  BILITOT 0.6  --   PROT 5.5 6.4  ALBUMIN 3 3.5   Recent Labs    05/21/18 09/01/18 09/07/18  WBC 11.8 14.6 9.0  HGB 12.9 13.4 12.4  HCT 38 39 36  PLT 223 201 240   Lab Results  Component Value Date   TSH 3.45 05/21/2018   Lab Results  Component Value Date   HGBA1C 6.7 07/18/2016   Lab Results  Component Value Date   CHOL 146 01/23/2017   HDL 39 01/23/2017   LDLCALC 76 01/23/2017   TRIG 214 (A) 01/23/2017    Significant Diagnostic Results in last 30 days:  No results found.  Assessment/Plan Dementia With Behavior issues and Skin Tear D/W the Nurses Patient continues to resist care sometimes. Continues to be full dependent Continue on Seroquel Would not do any GDR Try to Leave her when she is resisting care Essential hypertension BP controlled on amlodipine , Cozaar and Lopressor  Acquired hypothyroidism TSH normal in 01/20 Osteoporosis On Evista Weight Loss Was started on Remeron  Recently   Family/ staff Communication:   Labs/tests ordered:    Total time spent in this patient care encounter was  25_  minutes; greater than 50% of the visit spent counseling  staff, reviewing records , Labs and coordinating care for problems addressed at this encounter.

## 2018-11-28 ENCOUNTER — Other Ambulatory Visit: Payer: Self-pay

## 2018-11-28 ENCOUNTER — Encounter (HOSPITAL_COMMUNITY): Payer: Self-pay | Admitting: Emergency Medicine

## 2018-11-28 ENCOUNTER — Emergency Department (HOSPITAL_COMMUNITY)
Admission: EM | Admit: 2018-11-28 | Discharge: 2018-11-28 | Disposition: A | Discharge status: Skilled Nursing Facility | Payer: Medicare Other | Attending: Emergency Medicine | Admitting: Emergency Medicine

## 2018-11-28 ENCOUNTER — Emergency Department (HOSPITAL_COMMUNITY): Payer: Medicare Other

## 2018-11-28 DIAGNOSIS — S12191A Other nondisplaced fracture of second cervical vertebra, initial encounter for closed fracture: Secondary | ICD-10-CM | POA: Insufficient documentation

## 2018-11-28 DIAGNOSIS — S0011XA Contusion of right eyelid and periocular area, initial encounter: Secondary | ICD-10-CM | POA: Insufficient documentation

## 2018-11-28 DIAGNOSIS — Y939 Activity, unspecified: Secondary | ICD-10-CM | POA: Insufficient documentation

## 2018-11-28 DIAGNOSIS — Z7982 Long term (current) use of aspirin: Secondary | ICD-10-CM | POA: Insufficient documentation

## 2018-11-28 DIAGNOSIS — E039 Hypothyroidism, unspecified: Secondary | ICD-10-CM | POA: Diagnosis not present

## 2018-11-28 DIAGNOSIS — S0993XA Unspecified injury of face, initial encounter: Secondary | ICD-10-CM | POA: Diagnosis present

## 2018-11-28 DIAGNOSIS — S0181XA Laceration without foreign body of other part of head, initial encounter: Secondary | ICD-10-CM | POA: Diagnosis not present

## 2018-11-28 DIAGNOSIS — W19XXXA Unspecified fall, initial encounter: Secondary | ICD-10-CM | POA: Diagnosis not present

## 2018-11-28 DIAGNOSIS — F0390 Unspecified dementia without behavioral disturbance: Secondary | ICD-10-CM | POA: Insufficient documentation

## 2018-11-28 DIAGNOSIS — Y92129 Unspecified place in nursing home as the place of occurrence of the external cause: Secondary | ICD-10-CM | POA: Diagnosis not present

## 2018-11-28 DIAGNOSIS — S0081XA Abrasion of other part of head, initial encounter: Secondary | ICD-10-CM | POA: Diagnosis not present

## 2018-11-28 DIAGNOSIS — Z79899 Other long term (current) drug therapy: Secondary | ICD-10-CM | POA: Insufficient documentation

## 2018-11-28 DIAGNOSIS — I10 Essential (primary) hypertension: Secondary | ICD-10-CM | POA: Insufficient documentation

## 2018-11-28 DIAGNOSIS — S0101XA Laceration without foreign body of scalp, initial encounter: Secondary | ICD-10-CM

## 2018-11-28 DIAGNOSIS — Y999 Unspecified external cause status: Secondary | ICD-10-CM | POA: Diagnosis not present

## 2018-11-28 DIAGNOSIS — S0990XA Unspecified injury of head, initial encounter: Secondary | ICD-10-CM

## 2018-11-28 MED ORDER — LIDOCAINE-EPINEPHRINE-TETRACAINE (LET) SOLUTION
3.0000 mL | Freq: Once | NASAL | Status: AC
  Administered 2018-11-28: 3 mL via TOPICAL
  Filled 2018-11-28: qty 3

## 2018-11-28 NOTE — ED Notes (Signed)
Suture Cart at bedside.  

## 2018-11-28 NOTE — ED Provider Notes (Signed)
[facility] DEPT Provider Note   CSN: [id number] Arrival date & time: 11/28/18  0030     History   Chief Complaint Chief Complaint  Patient presents with   Fall   Head Laceration    above rt eye   Level 5 caveat due to dementia HPI Melissa Carroll is a 85 y.o. female.     The history is provided by the EMS personnel.  Fall This is a new problem. The problem has not changed since onset.Nothing aggravates the symptoms. Nothing relieves the symptoms.  Head Laceration  Patient with history of hypertension, dementia, previous head injury presents with fall.  Patient presents from nursing facility for unwitnessed fall.  She has swelling around her right eye and her laceration to forehead No other injuries are reported  Past Medical History:  Diagnosis Date   Arthritis    Cataracts, bilateral    Closed fracture of unspecified part of lower end of humerus    Dementia ([place])    Hypertension    Insomnia, unspecified    Macular degeneration (senile) of retina, unspecified    Osteoarthritis    Osteoarthrosis, unspecified whether generalized or localized, unspecified site    Pancreatitis, acute 08/14/2013   Retinal neovascularization NOS    Senile osteoporosis    Subarachnoid hemorrhage following injury ([place]) 03/31/2012   Unspecified hearing loss    Unspecified hypothyroidism    Urinary tract infection 08/10/2013   Vertebral fracture 08/09/2013   T7 10%, T12 70%     Patient Active Problem List   Diagnosis Date Noted   Hyperlipidemia 05/30/2017   Vitamin D deficiency 02/19/2017   Osteoporosis with pathological fracture 01/22/2017   Vascular dementia with behavior disturbance ([place]) 01/22/2017   Unsteady gait 01/22/2017   History of CVA (cerebrovascular accident) 11/25/2016   Cerebrovascular disease 07/15/2016   Cerebral atrophy ([place]) 07/15/2016   Meningioma ([place]) 07/15/2016   Scalp laceration 06/28/2016    Hypertensive retinopathy 01/24/2015   Posterior vitreous detachment 01/24/2015   Pseudoaphakia 01/24/2015   GERD (gastroesophageal reflux disease) 09/14/2013   Musculoskeletal pain 08/14/2013   Fracture, vertebra, pathologic 08/09/2013   AMD (age-related macular degeneration), wet ([place]) 02/18/2013   Constipation 09/11/2012   Hypothyroidism 08/14/2012   Osteoarthritis 08/14/2012   Hypertension 04/02/2012   Hard of hearing 03/31/2012   Subarachnoid hemorrhage following injury ([place]) 03/31/2012    Past Surgical History:  Procedure Laterality Date   ABDOMINAL HYSTERECTOMY  1942   ANKLE FRACTURE SURGERY  1960   BLADDER SUSPENSION     CLOSED REDUCTION WITH HUMERAL PIN INSERTION  04/01/2012   Procedure: CLOSED REDUCTION WITH HUMERAL PIN INSERTION;  Surgeon: Schuyler Amor, MD;  Location: [facility];  Service: Orthopedics;  Laterality: Left;     OB History   No obstetric history on file.      Home Medications    Prior to Admission medications   Medication Sig Start Date End Date Taking? Authorizing Provider  amLODipine (NORVASC) 10 MG tablet Take 10 mg by mouth daily.    [provider]  aspirin 81 MG chewable tablet Chew 81 mg by mouth daily.    [provider]  Cholecalciferol 1000 units capsule Take 2,000 Units by mouth daily.     [provider]  ketoconazole (NIZORAL) 2 % shampoo Apply 1 application topically every Wednesday. Send shampoo with daughter to beauty shop with resident on Wednesday     [provider]  levothyroxine (SYNTHROID, LEVOTHROID) 88 MCG tablet Take 88 mcg  by mouth daily before breakfast.    [provider]  losartan (COZAAR) 100 MG tablet Take 1 tablet (100 mg total) by mouth daily. 08/28/17   Blanchie Serve, MD  metoprolol tartrate (LOPRESSOR) 50 MG tablet Take 50 mg by mouth 2 (two) times daily.    [provider]  mirtazapine (REMERON) 7.5 MG tablet Take 7.5 mg by mouth at bedtime.     [provider]  Nutritional Supplements (BOOST GLUCOSE CONTROL) LIQD Take 237 mLs by mouth 2 (two) times daily between meals.    [provider]  polyethylene glycol powder (GLYCOLAX/MIRALAX) powder Take 17 g by mouth daily. Hold for loose stool 01/06/18   Ngetich, Dinah C, NP  QUEtiapine (SEROQUEL) 25 MG tablet Take 25 mg by mouth at bedtime.    [provider]  raloxifene (EVISTA) 60 MG tablet Take 60 mg by mouth daily.    [provider]    Family History Family History  Problem Relation Age of Onset   Hypertension Mother     Social History Social History   Tobacco Use   Smoking status: Never Smoker   Smokeless tobacco: Never Used  Substance Use Topics   Alcohol use: No   Drug use: No     Allergies   Patient has no known allergies.   Review of Systems Review of Systems  Unable to perform ROS: Dementia     Physical Exam Updated Vital Signs BP (!) 156/63 (BP Location: Right Arm)    Pulse 76    Temp 98.4 F (36.9 C) (Oral)    Resp (!) 22    SpO2 98%   Physical Exam  CONSTITUTIONAL: Elderly and frail HEAD: Small laceration to right forehead.  Bleeding controlled. EYES: Significant right periorbital edema and bruising.  Small abrasion noted.  Left eye is uninjured. No nasal or dental injury noted ENMT: Mucous membranes moist NECK: supple no meningeal signs SPINE/BACK:entire spine nontender, No bruising/crepitance/stepoffs noted to spine CV: S1/S2 noted  LUNGS: Lungs are clear to auscultation bilaterally, no apparent distress ABDOMEN: soft, nontender NEURO: Pt is awake/alert, moves all extremitiesx4.  Pt is confused, hard of hearing which makes exam difficult EXTREMITIES: pulses normal/equal, full ROM, pelvis stable, All other extremities/joints palpated/ranged and nontender SKIN: warm, color normal PSYCH: unable to assess  ED Treatments / Results  Labs (all labs ordered are listed, but only abnormal results are  displayed) Labs Reviewed - No data to display  EKG None  Radiology Ct Head Wo Contrast  Result Date: 11/28/2018 CLINICAL DATA:  Initial evaluation for acute trauma, fall. EXAM: CT HEAD WITHOUT CONTRAST CT MAXILLOFACIAL WITHOUT CONTRAST CT CERVICAL SPINE WITHOUT CONTRAST TECHNIQUE: Multidetector CT imaging of the head, cervical spine, and maxillofacial structures were performed using the standard protocol without intravenous contrast. Multiplanar CT image reconstructions of the cervical spine and maxillofacial structures were also generated. COMPARISON:  Prior CT from 09/04/2018. FINDINGS: CT HEAD FINDINGS Brain: Diffuse prominence of the CSF containing spaces compatible with generalized age-related cerebral atrophy. Confluent hypodensity within the periventricular and deep white matter both cerebral hemispheres most consistent with chronic microvascular ischemic disease, moderate in nature. Superimposed small remote left cerebellar infarct, with additional chronic bilateral basal ganglia lacunar infarcts. No acute intracranial hemorrhage. No acute large vessel territory infarct. No mass lesion, midline shift or mass effect. Diffuse ventricular prominence related to global parenchymal volume loss of hydrocephalus. No extra-axial fluid collection. Vascular: No hyperdense vessel. Calcified atherosclerosis at the skull base. Skull: Small right frontal scalp contusion/laceration. Calvarium  intact. Focal exostosis noted overlying the right frontal convexity. Other: Visualized mastoids are clear. CT MAXILLOFACIAL FINDINGS Osseous: Zygomatic arches intact. No acute maxillary fracture. Pterygoid plates intact. Nasal bones intact. S-shaped deviation of the nasal septum without fracture. Mandible intact. Mandibular condyles normally situated. No acute abnormality about the dentition. Orbits: Globes orbital soft tissues within normal limits. Ocular senescent calcifications noted. Bony orbits intact. Sinuses: Mild  scattered mucosal thickening within the left posterior ethmoidal air cells. Paranasal sinuses are otherwise clear. No hemosinus. Soft tissues: Soft tissue contusion seen involving the right face and periorbital soft tissues. Contusion/laceration at the right forehead. CT CERVICAL SPINE FINDINGS Alignment: Vertebral bodies normally aligned with preservation of the normal cervical lordosis. Trace degenerative anterolisthesis of C7 on T1 and T1 on T2. Skull base and vertebrae: Skull base intact. Normal C1-2 articulations are preserved. Dens intact. There is an acute minimally displaced fracture through the anterior inferior aspect of the C2 vertebral body (series 13, image 29). Additional acute vertically oriented fracture extends through the anterior aspect of the C3 vertebral body, with extension through both superior and inferior endplates (series 13, image 30). Vertebral body heights maintained. No other acute fracture identified. Soft tissues and spinal canal: Paraspinous soft tissues demonstrate no acute finding. Spinal canal within normal limits. No significant prevertebral edema at this time. Vascular calcifications about the carotid bifurcations. Disc levels: Moderate degenerative spondylolysis present at C3-4 through C6-7. Multilevel facet arthrosis, most notable at C2-3 on the left. Upper chest: Visualized upper chest demonstrates no acute finding. Other: None. IMPRESSION: CT HEAD: 1. No acute intracranial abnormality identified. 2. Soft tissue laceration at the right forehead. No calvarial fracture. 3. Generalized age-related cerebral atrophy with moderate chronic microvascular ischemic disease. CT MAXILLOFACIAL: 1. Soft tissue contusion involving the right periorbital soft tissues and right face. 2. No other acute maxillofacial injury. Intact globes with no retro-orbital pathology. No fracture. CT CERVICAL SPINE: 1. Acute fractures involving the anterior/inferior C2 vertebral body, with additional  vertically oriented fracture through the anterior C3 vertebral body. Given the fracture locations, possible anterior longitudinal ligament injury should be considered. No listhesis or malalignment. 2. Moderate cervical spondylolysis at C3-4 through C6-7. Critical Value/emergent results were called by telephone at the time of interpretation on 11/28/2018 at 2:01 am to the emergency room physician assistant, Joline Maxcy who verbally acknowledged these results. Electronically Signed   By: Jeannine Boga M.D.   On: 11/28/2018 02:11   Ct Cervical Spine Wo Contrast  Result Date: 11/28/2018 CLINICAL DATA:  Initial evaluation for acute trauma, fall. EXAM: CT HEAD WITHOUT CONTRAST CT MAXILLOFACIAL WITHOUT CONTRAST CT CERVICAL SPINE WITHOUT CONTRAST TECHNIQUE: Multidetector CT imaging of the head, cervical spine, and maxillofacial structures were performed using the standard protocol without intravenous contrast. Multiplanar CT image reconstructions of the cervical spine and maxillofacial structures were also generated. COMPARISON:  Prior CT from 09/04/2018. FINDINGS: CT HEAD FINDINGS Brain: Diffuse prominence of the CSF containing spaces compatible with generalized age-related cerebral atrophy. Confluent hypodensity within the periventricular and deep white matter both cerebral hemispheres most consistent with chronic microvascular ischemic disease, moderate in nature. Superimposed small remote left cerebellar infarct, with additional chronic bilateral basal ganglia lacunar infarcts. No acute intracranial hemorrhage. No acute large vessel territory infarct. No mass lesion, midline shift or mass effect. Diffuse ventricular prominence related to global parenchymal volume loss of hydrocephalus. No extra-axial fluid collection. Vascular: No hyperdense vessel. Calcified atherosclerosis at the skull base. Skull: Small right frontal scalp contusion/laceration. Calvarium intact. Focal exostosis noted overlying  the right  frontal convexity. Other: Visualized mastoids are clear. CT MAXILLOFACIAL FINDINGS Osseous: Zygomatic arches intact. No acute maxillary fracture. Pterygoid plates intact. Nasal bones intact. S-shaped deviation of the nasal septum without fracture. Mandible intact. Mandibular condyles normally situated. No acute abnormality about the dentition. Orbits: Globes orbital soft tissues within normal limits. Ocular senescent calcifications noted. Bony orbits intact. Sinuses: Mild scattered mucosal thickening within the left posterior ethmoidal air cells. Paranasal sinuses are otherwise clear. No hemosinus. Soft tissues: Soft tissue contusion seen involving the right face and periorbital soft tissues. Contusion/laceration at the right forehead. CT CERVICAL SPINE FINDINGS Alignment: Vertebral bodies normally aligned with preservation of the normal cervical lordosis. Trace degenerative anterolisthesis of C7 on T1 and T1 on T2. Skull base and vertebrae: Skull base intact. Normal C1-2 articulations are preserved. Dens intact. There is an acute minimally displaced fracture through the anterior inferior aspect of the C2 vertebral body (series 13, image 29). Additional acute vertically oriented fracture extends through the anterior aspect of the C3 vertebral body, with extension through both superior and inferior endplates (series 13, image 30). Vertebral body heights maintained. No other acute fracture identified. Soft tissues and spinal canal: Paraspinous soft tissues demonstrate no acute finding. Spinal canal within normal limits. No significant prevertebral edema at this time. Vascular calcifications about the carotid bifurcations. Disc levels: Moderate degenerative spondylolysis present at C3-4 through C6-7. Multilevel facet arthrosis, most notable at C2-3 on the left. Upper chest: Visualized upper chest demonstrates no acute finding. Other: None. IMPRESSION: CT HEAD: 1. No acute intracranial abnormality identified. 2. Soft  tissue laceration at the right forehead. No calvarial fracture. 3. Generalized age-related cerebral atrophy with moderate chronic microvascular ischemic disease. CT MAXILLOFACIAL: 1. Soft tissue contusion involving the right periorbital soft tissues and right face. 2. No other acute maxillofacial injury. Intact globes with no retro-orbital pathology. No fracture. CT CERVICAL SPINE: 1. Acute fractures involving the anterior/inferior C2 vertebral body, with additional vertically oriented fracture through the anterior C3 vertebral body. Given the fracture locations, possible anterior longitudinal ligament injury should be considered. No listhesis or malalignment. 2. Moderate cervical spondylolysis at C3-4 through C6-7. Critical Value/emergent results were called by telephone at the time of interpretation on 11/28/2018 at 2:01 am to the emergency room physician assistant, Joline Maxcy who verbally acknowledged these results. Electronically Signed   By: Jeannine Boga M.D.   On: 11/28/2018 02:11   Ct Maxillofacial Wo Contrast  Result Date: 11/28/2018 CLINICAL DATA:  Initial evaluation for acute trauma, fall. EXAM: CT HEAD WITHOUT CONTRAST CT MAXILLOFACIAL WITHOUT CONTRAST CT CERVICAL SPINE WITHOUT CONTRAST TECHNIQUE: Multidetector CT imaging of the head, cervical spine, and maxillofacial structures were performed using the standard protocol without intravenous contrast. Multiplanar CT image reconstructions of the cervical spine and maxillofacial structures were also generated. COMPARISON:  Prior CT from 09/04/2018. FINDINGS: CT HEAD FINDINGS Brain: Diffuse prominence of the CSF containing spaces compatible with generalized age-related cerebral atrophy. Confluent hypodensity within the periventricular and deep white matter both cerebral hemispheres most consistent with chronic microvascular ischemic disease, moderate in nature. Superimposed small remote left cerebellar infarct, with additional chronic bilateral  basal ganglia lacunar infarcts. No acute intracranial hemorrhage. No acute large vessel territory infarct. No mass lesion, midline shift or mass effect. Diffuse ventricular prominence related to global parenchymal volume loss of hydrocephalus. No extra-axial fluid collection. Vascular: No hyperdense vessel. Calcified atherosclerosis at the skull base. Skull: Small right frontal scalp contusion/laceration. Calvarium intact. Focal exostosis noted overlying the right frontal convexity. Other:  Visualized mastoids are clear. CT MAXILLOFACIAL FINDINGS Osseous: Zygomatic arches intact. No acute maxillary fracture. Pterygoid plates intact. Nasal bones intact. S-shaped deviation of the nasal septum without fracture. Mandible intact. Mandibular condyles normally situated. No acute abnormality about the dentition. Orbits: Globes orbital soft tissues within normal limits. Ocular senescent calcifications noted. Bony orbits intact. Sinuses: Mild scattered mucosal thickening within the left posterior ethmoidal air cells. Paranasal sinuses are otherwise clear. No hemosinus. Soft tissues: Soft tissue contusion seen involving the right face and periorbital soft tissues. Contusion/laceration at the right forehead. CT CERVICAL SPINE FINDINGS Alignment: Vertebral bodies normally aligned with preservation of the normal cervical lordosis. Trace degenerative anterolisthesis of C7 on T1 and T1 on T2. Skull base and vertebrae: Skull base intact. Normal C1-2 articulations are preserved. Dens intact. There is an acute minimally displaced fracture through the anterior inferior aspect of the C2 vertebral body (series 13, image 29). Additional acute vertically oriented fracture extends through the anterior aspect of the C3 vertebral body, with extension through both superior and inferior endplates (series 13, image 30). Vertebral body heights maintained. No other acute fracture identified. Soft tissues and spinal canal: Paraspinous soft tissues  demonstrate no acute finding. Spinal canal within normal limits. No significant prevertebral edema at this time. Vascular calcifications about the carotid bifurcations. Disc levels: Moderate degenerative spondylolysis present at C3-4 through C6-7. Multilevel facet arthrosis, most notable at C2-3 on the left. Upper chest: Visualized upper chest demonstrates no acute finding. Other: None. IMPRESSION: CT HEAD: 1. No acute intracranial abnormality identified. 2. Soft tissue laceration at the right forehead. No calvarial fracture. 3. Generalized age-related cerebral atrophy with moderate chronic microvascular ischemic disease. CT MAXILLOFACIAL: 1. Soft tissue contusion involving the right periorbital soft tissues and right face. 2. No other acute maxillofacial injury. Intact globes with no retro-orbital pathology. No fracture. CT CERVICAL SPINE: 1. Acute fractures involving the anterior/inferior C2 vertebral body, with additional vertically oriented fracture through the anterior C3 vertebral body. Given the fracture locations, possible anterior longitudinal ligament injury should be considered. No listhesis or malalignment. 2. Moderate cervical spondylolysis at C3-4 through C6-7. Critical Value/emergent results were called by telephone at the time of interpretation on 11/28/2018 at 2:01 am to the emergency room physician assistant, Joline Maxcy who verbally acknowledged these results. Electronically Signed   By: Jeannine Boga M.D.   On: 11/28/2018 02:11    Procedures .Marland KitchenLaceration Repair  Date/Time: 11/28/2018 3:00 AM Performed by: Ripley Fraise, MD Authorized by: Ripley Fraise, MD   Consent:    Consent obtained:  Verbal Laceration details:    Location:  Scalp   Scalp location:  Frontal   Length (cm):  1 Repair type:    Repair type:  Simple Pre-procedure details:    Preparation:  Patient was prepped and draped in usual sterile fashion Exploration:    Contaminated: no   Treatment:     Amount of cleaning:  Standard Skin repair:    Repair method:  Sutures   Suture size:  5-0   Wound skin closure material used: vicryl.   Suture technique:  Simple interrupted   Number of sutures:  1 Approximation:    Approximation:  Close Post-procedure details:    Patient tolerance of procedure:  Tolerated with difficulty Comments:     Patient became extremely agitated during suture repair. I was able to place 1 suture to the forehead wound. .Critical Care Performed by: Ripley Fraise, MD Authorized by: Ripley Fraise, MD   Critical care provider statement:  Critical care time (minutes):  35   Critical care start time:  11/28/2018 2:45 AM   Critical care end time:  11/28/2018 3:20 AM   Critical care was necessary to treat or prevent imminent or life-threatening deterioration of the following conditions:  CNS failure or compromise and trauma   Critical care was time spent personally by me on the following activities:  Development of treatment plan with patient or surrogate, ordering and review of radiographic studies, re-evaluation of patient's condition, review of old charts and examination of patient    Medications Ordered in ED Medications  lidocaine-EPINEPHrine-tetracaine (LET) solution (3 mLs Topical Given 11/28/18 0133)     Initial Impression / Assessment and Plan / ED Course  I have reviewed the triage vital signs and the nursing notes.  Pertinent imaging results that were available during my care of the patient were reviewed by me and considered in my medical decision making (see chart for details).        1:04 AM Patient presents after unwitnessed fall from nursing facility.  Due to her dementia, she is unable to provide history. She has significant soft tissue swelling around her right eye.  Will obtain CT head/max with facial.  She will also need CT C-spine.  I attempted to call nursing facility but no one answered 3:23 AM Patient was found to have a C2/C3  fracture.  No focal neuro deficits, she is moving all extremities.  No indication for operative management.  Discussed with neurosurgery on-call, they recommend Aspen collar/pediatric collar due to her size.  She can follow-up as an outpatient. Of note, patient is a DNR  Spoke to daughter via phone.  Discussed the findings of cervical spine fracture.  Information for neurosurgery follow-up is been given.  They will likely need to be least phone consulted to see how long the collar should stay on No signs of head injury. No signs of any globe injury.  I am able to see her right eye, she has  good range of motion noted. Wound below right eye is an abrasion/skin tear and is not amenable for repair Final Clinical Impressions(s) / ED Diagnoses   Final diagnoses:  Injury of head, initial encounter  Laceration of scalp without foreign body, initial encounter  Other closed nondisplaced fracture of second cervical vertebra, initial encounter [facility])    ED Discharge Orders    None       Ripley Fraise, MD 11/28/18 [phone]

## 2018-11-28 NOTE — ED Triage Notes (Signed)
Patient BIB [facility] from [facility] for unwitnessed fall. Pt sustained head laceration above rt eye and eye is swollen. Pt also has skin tear on rt forearm. Pt has dementia and per staff pt is at baseline, not answering questions regarding the fall.

## 2018-11-28 NOTE — ED Notes (Signed)
[facility] called for transportation  

## 2018-12-18 ENCOUNTER — Non-Acute Institutional Stay (SKILLED_NURSING_FACILITY): Payer: Medicare Other | Admitting: Nurse Practitioner

## 2018-12-18 ENCOUNTER — Encounter: Payer: Self-pay | Admitting: Nurse Practitioner

## 2018-12-18 DIAGNOSIS — I10 Essential (primary) hypertension: Secondary | ICD-10-CM | POA: Diagnosis not present

## 2018-12-18 DIAGNOSIS — R634 Abnormal weight loss: Secondary | ICD-10-CM

## 2018-12-18 DIAGNOSIS — E039 Hypothyroidism, unspecified: Secondary | ICD-10-CM

## 2018-12-18 DIAGNOSIS — F0151 Vascular dementia with behavioral disturbance: Secondary | ICD-10-CM

## 2018-12-18 DIAGNOSIS — F01518 Vascular dementia, unspecified severity, with other behavioral disturbance: Secondary | ICD-10-CM

## 2018-12-18 NOTE — Assessment & Plan Note (Signed)
blood pressure is controlled, continue  Metoprolol 50mg  bid, Losartan 100mg  qd, Amlodipine 10mg  qd.

## 2018-12-18 NOTE — Assessment & Plan Note (Signed)
Continue SNF [facility] for safety and care assistance, w/c for mobility, her mood is stable, continue  Quetiapine 25mg  qd.

## 2018-12-18 NOTE — Assessment & Plan Note (Signed)
#  5-6Ibs weight loss in the past month, she denied GI symptoms, will update CBC/diff, CMP/eGFR, TSH. F/u dietary.

## 2018-12-18 NOTE — Progress Notes (Signed)
Location:  [place] Room Number: 2 Place of Service:  SNF (31) Provider:  Marlana Latus  NP  Virgie Dad, MD  Patient Care Team: Virgie Dad, MD as PCP - General (Internal Medicine) ,  X, NP as Nurse Practitioner (Nurse Practitioner) Charlotte Crumb, MD as Consulting Physician (Orthopedic Surgery) Regal, Tamala Fothergill, DPM as Consulting Physician (Podiatry) Gaynelle Arabian, MD as Consulting Physician (Orthopedic Surgery) Zebedee Iba., MD as Referring Physician (Ophthalmology) Newman Pies, MD as Consulting Physician (Neurosurgery) Melina Modena, [facility] ([facility] and [place]) ,  X, NP as Nurse Practitioner (Internal Medicine) Ngetich, Nelda Bucks, NP as Nurse Practitioner (Family Medicine)  Extended Emergency Contact Information Primary Emergency Contact: Arrazola,[name] Address: [place], [place] [address] Johnnette Litter of [place] Phone: [phone] Mobile Phone: [phone] Relation: Daughter Secondary Emergency Contact: assas of [place] Phone: [phone] Relation: Grandson  Code Status:  DNR Goals of care: Advanced Directive information Advanced Directives 12/18/2018  Does Patient Have a Medical Advance Directive? Yes  Type of Advance Directive Out of facility DNR (pink MOST or yellow form);[place];Living will  Does patient want to make changes to medical advance directive? No - Patient declined  Copy of [place] in Chart? Yes - validated most recent copy scanned in chart (See row information)  Pre-existing out of facility DNR order (yellow form or pink MOST form) Yellow form placed in chart (order not valid for inpatient use)     Chief Complaint  Patient presents with   Medical agement of Chronic Issues    HPI:  Pt is a 85 y.o. female seen today for medical management of chronic diseases.    The  patient resides in SNF [facility] for safety and care assistance, w/c for mobility, her mood is stable on Quetiapine 54m qd. Weight loss about #5-6Ibs in the past month, she denied abd pain, nausea, vomiting, constipation, or diarrhea. Off Mirtazapine. Hx of constipation, stable on MiraLax qd. HTN, blood pressure is controlled on Metoprolol 561mbid, Losartan 10069md, Amlodipine 73m59m. Hypothyroidism, on Levothyroxine 88mc31m, last TSH wnl.    Past Medical History:  Diagnosis Date   Arthritis    Cataracts, bilateral    Closed fracture of unspecified part of lower end of humerus    Dementia (HCC) [place]    Insomnia, unspecified    Macular degeneration (senile) of retina, unspecified    Osteoarthritis    Osteoarthrosis, unspecified whether generalized or localized, unspecified site    Pancreatitis, acute 08/14/2013   Retinal neovascularization NOS    Senile osteoporosis    Subarachnoid hemorrhage following injury (HCC) [place]19/2013   Unspecified hearing loss    Unspecified hypothyroidism    Urinary tract infection 08/10/2013   Vertebral fracture 08/09/2013   T7 10%, T12 70%    Past Surgical History:  Procedure Laterality Date   ABDOMINAL HYSTERECTOMY  1942   ANKLE FRACTURE SURGERY  1960   BLADDER SUSPENSION     CLOSED REDUCTION WITH HUMERAL PIN INSERTION  04/01/2012   Procedure: CLOSED REDUCTION WITH HUMERAL PIN INSERTION;  Surgeon: MatthSchuyler Amor  Location: [facility]S;  Service: Orthopedics;  Laterality: Left;    No Known Allergies  Outpatient Encounter Medications as of 12/18/2018  Medication Sig   amLODipine (NORVASC) 10 MG tablet Take 10 mg by mouth daily.   aspirin 81  MG chewable tablet Chew 81 mg by mouth daily.   Cholecalciferol 1000 units capsule Take 2,000 Units by mouth daily.    levothyroxine (SYNTHROID, LEVOTHROID) 88 MCG tablet Take 88 mcg by mouth daily before breakfast.   losartan (COZAAR) 100 MG tablet Take 1 tablet (100 mg total)  by mouth daily.   metoprolol tartrate (LOPRESSOR) 50 MG tablet Take 50 mg by mouth 2 (two) times daily.   Nutritional Supplements (BOOST GLUCOSE CONTROL) LIQD Take 237 mLs by mouth 2 (two) times daily between meals.   polyethylene glycol powder (GLYCOLAX/MIRALAX) powder Take 17 g by mouth daily. Hold for loose stool   QUEtiapine (SEROQUEL) 25 MG tablet Take 25 mg by mouth at bedtime.   raloxifene (EVISTA) 60 MG tablet Take 60 mg by mouth daily.   [DISCONTINUED] ketoconazole (NIZORAL) 2 % shampoo Apply 1 application topically every Wednesday. Send shampoo with daughter to beauty shop with resident on Wednesday    [DISCONTINUED] mirtazapine (REMERON) 7.5 MG tablet Take 7.5 mg by mouth at bedtime.   No facility-administered encounter medications on file as of 12/18/2018.    ROS was provided with assistance of staff Review of Systems  Constitutional: Positive for unexpected weight change. Negative for activity change, appetite change, chills, diaphoresis, fatigue and fever.       Weight loss about #5-6Ibs in the past month  HENT: Positive for hearing loss. Negative for congestion and voice change.   Respiratory: Negative for cough, shortness of breath and wheezing.   Cardiovascular: Negative for chest pain, palpitations and leg swelling.  Gastrointestinal: Negative for abdominal distention, abdominal pain, constipation, diarrhea, nausea and vomiting.  Genitourinary: Negative for difficulty urinating, dysuria and urgency.  Musculoskeletal: Positive for arthralgias and gait problem.  Skin: Negative for color change and pallor.  Neurological: Negative for dizziness, speech difficulty, weakness and headaches.       Dementia  Psychiatric/Behavioral: Positive for confusion. Negative for agitation, behavioral problems, hallucinations and sleep disturbance. The patient is not nervous/anxious.     Immunization History  Administered Date(s) Administered   Influenza-Unspecified 03/02/2013,  02/15/2014, 02/16/2015, 02/22/2016, 02/20/2017, 02/23/2018   PPD Test 03/23/2012   Pneumococcal Conjugate-13 12/17/2016   Pneumococcal Polysaccharide-23 09/22/2002   Td 11/17/2002   Tdap 12/16/2016   Pertinent  Health Maintenance Due  Topic Date Due   INFLUENZA VACCINE  12/12/2018   DEXA SCAN  Completed   PNA vac Low Risk Adult  Completed   Fall Risk  12/24/2017 12/13/2016 02/07/2015 02/22/2014 02/22/2014  Falls in the past year? Yes No No Yes No  Number falls in past yr: 1 - - 1 -  Injury with Fall? Yes - - - -  Risk for fall due to : - - - History of fall(s) -   Functional Status Survey:    Vitals:   12/18/18 0900  BP: 128/61  Pulse: (!) 57  Resp: 18  Temp: (!) 97.2 F (36.2 C)  SpO2: 98%  Weight: 112 lb 12.8 oz (51.2 kg)  Height: 5' 1" (1.549 m)   Body mass index is 21.31 kg/m. Physical Exam Constitutional:      General: She is not in acute distress.    Appearance: Normal appearance. She is normal weight. She is not ill-appearing or toxic-appearing.  HENT:     Head: Normocephalic and atraumatic.     Nose: Nose normal.     Mouth/Throat:     Mouth: Mucous membranes are moist.  Eyes:     Extraocular Movements: Extraocular movements intact.  Conjunctiva/sclera: Conjunctivae normal.     Pupils: Pupils are equal, round, and reactive to light.  Neck:     Musculoskeletal: Normal range of motion and neck supple.  Cardiovascular:     Rate and Rhythm: Normal rate and regular rhythm.     Heart sounds: No murmur.  Pulmonary:     Effort: Pulmonary effort is normal.     Breath sounds: No wheezing, rhonchi or rales.  Abdominal:     General: Bowel sounds are normal. There is no distension.     Palpations: Abdomen is soft.     Tenderness: There is no abdominal tenderness. There is no right CVA tenderness, left CVA tenderness, guarding or rebound.  Musculoskeletal:     Right lower leg: No edema.     Left lower leg: No edema.     Comments: W/c for mobility   Skin:    General: Skin is warm and dry.  Neurological:     General: No focal deficit present.     Mental Status: She is alert. Mental status is at baseline.     Cranial Nerves: No cranial nerve deficit.     Motor: No weakness.     Gait: Gait abnormal.     Comments: Oriented to self, took a deep breath when told to  Psychiatric:        Mood and Affect: Mood normal.        Behavior: Behavior normal.     Labs reviewed: Recent Labs    01/19/18 05/21/18 09/01/18  NA 139 139 139  K 4.1 4.6 4.2  CL  --  105  --   CO2  --  23  --   BUN 24* 28* 28*  CREATININE 0.66 0.7 0.7  CALCIUM 8.8 9.1  --    Recent Labs    01/19/18 05/21/18  AST 11 14  ALT 10 11  ALKPHOS 37 51  BILITOT 0.6  --   PROT 5.5 6.4  ALBUMIN 3 3.5   Recent Labs    05/21/18 09/01/18 09/07/18  WBC 11.8 14.6 9.0  HGB 12.9 13.4 12.4  HCT 38 39 36  PLT 223 201 240   Lab Results  Component Value Date   TSH 3.45 05/21/2018   Lab Results  Component Value Date   HGBA1C 6.7 07/18/2016   Lab Results  Component Value Date   CHOL 146 01/23/2017   HDL 39 01/23/2017   LDLCALC 76 01/23/2017   TRIG 214 (A) 01/23/2017    Significant Diagnostic Results in last 30 days:  Ct Head Wo Contrast  Result Date: 11/28/2018 CLINICAL DATA:  Initial evaluation for acute trauma, fall. EXAM: CT HEAD WITHOUT CONTRAST CT MAXILLOFACIAL WITHOUT CONTRAST CT CERVICAL SPINE WITHOUT CONTRAST TECHNIQUE: Multidetector CT imaging of the head, cervical spine, and maxillofacial structures were performed using the standard protocol without intravenous contrast. Multiplanar CT image reconstructions of the cervical spine and maxillofacial structures were also generated. COMPARISON:  Prior CT from 09/04/2018. FINDINGS: CT HEAD FINDINGS Brain: Diffuse prominence of the CSF containing spaces compatible with generalized age-related cerebral atrophy. Confluent hypodensity within the periventricular and deep white matter both cerebral hemispheres most  consistent with chronic microvascular ischemic disease, moderate in nature. Superimposed small remote left cerebellar infarct, with additional chronic bilateral basal ganglia lacunar infarcts. No acute intracranial hemorrhage. No acute large vessel territory infarct. No mass lesion, midline shift or mass effect. Diffuse ventricular prominence related to global parenchymal volume loss of hydrocephalus. No extra-axial fluid collection. Vascular: No hyperdense  vessel. Calcified atherosclerosis at the skull base. Skull: Small right frontal scalp contusion/laceration. Calvarium intact. Focal exostosis noted overlying the right frontal convexity. Other: Visualized mastoids are clear. CT MAXILLOFACIAL FINDINGS Osseous: Zygomatic arches intact. No acute maxillary fracture. Pterygoid plates intact. Nasal bones intact. S-shaped deviation of the nasal septum without fracture. Mandible intact. Mandibular condyles normally situated. No acute abnormality about the dentition. Orbits: Globes orbital soft tissues within normal limits. Ocular senescent calcifications noted. Bony orbits intact. Sinuses: Mild scattered mucosal thickening within the left posterior ethmoidal air cells. Paranasal sinuses are otherwise clear. No hemosinus. Soft tissues: Soft tissue contusion seen involving the right face and periorbital soft tissues. Contusion/laceration at the right forehead. CT CERVICAL SPINE FINDINGS Alignment: Vertebral bodies normally aligned with preservation of the normal cervical lordosis. Trace degenerative anterolisthesis of C7 on T1 and T1 on T2. Skull base and vertebrae: Skull base intact. Normal C1-2 articulations are preserved. Dens intact. There is an acute minimally displaced fracture through the anterior inferior aspect of the C2 vertebral body (series 13, image 29). Additional acute vertically oriented fracture extends through the anterior aspect of the C3 vertebral body, with extension through both superior and inferior  endplates (series 13, image 30). Vertebral body heights maintained. No other acute fracture identified. Soft tissues and spinal canal: Paraspinous soft tissues demonstrate no acute finding. Spinal canal within normal limits. No significant prevertebral edema at this time. Vascular calcifications about the carotid bifurcations. Disc levels: Moderate degenerative spondylolysis present at C3-4 through C6-7. Multilevel facet arthrosis, most notable at C2-3 on the left. Upper chest: Visualized upper chest demonstrates no acute finding. Other: None. IMPRESSION: CT HEAD: 1. No acute intracranial abnormality identified. 2. Soft tissue laceration at the right forehead. No calvarial fracture. 3. Generalized age-related cerebral atrophy with moderate chronic microvascular ischemic disease. CT MAXILLOFACIAL: 1. Soft tissue contusion involving the right periorbital soft tissues and right face. 2. No other acute maxillofacial injury. Intact globes with no retro-orbital pathology. No fracture. CT CERVICAL SPINE: 1. Acute fractures involving the anterior/inferior C2 vertebral body, with additional vertically oriented fracture through the anterior C3 vertebral body. Given the fracture locations, possible anterior longitudinal ligament injury should be considered. No listhesis or malalignment. 2. Moderate cervical spondylolysis at C3-4 through C6-7. Critical Value/emergent results were called by telephone at the time of interpretation on 11/28/2018 at 2:01 am to the emergency room physician assistant, Joline Maxcy who verbally acknowledged these results. Electronically Signed   By: Jeannine Boga M.D.   On: 11/28/2018 02:11   Ct Cervical Spine Wo Contrast  Result Date: 11/28/2018 CLINICAL DATA:  Initial evaluation for acute trauma, fall. EXAM: CT HEAD WITHOUT CONTRAST CT MAXILLOFACIAL WITHOUT CONTRAST CT CERVICAL SPINE WITHOUT CONTRAST TECHNIQUE: Multidetector CT imaging of the head, cervical spine, and maxillofacial  structures were performed using the standard protocol without intravenous contrast. Multiplanar CT image reconstructions of the cervical spine and maxillofacial structures were also generated. COMPARISON:  Prior CT from 09/04/2018. FINDINGS: CT HEAD FINDINGS Brain: Diffuse prominence of the CSF containing spaces compatible with generalized age-related cerebral atrophy. Confluent hypodensity within the periventricular and deep white matter both cerebral hemispheres most consistent with chronic microvascular ischemic disease, moderate in nature. Superimposed small remote left cerebellar infarct, with additional chronic bilateral basal ganglia lacunar infarcts. No acute intracranial hemorrhage. No acute large vessel territory infarct. No mass lesion, midline shift or mass effect. Diffuse ventricular prominence related to global parenchymal volume loss of hydrocephalus. No extra-axial fluid collection. Vascular: No hyperdense vessel. Calcified atherosclerosis at the  skull base. Skull: Small right frontal scalp contusion/laceration. Calvarium intact. Focal exostosis noted overlying the right frontal convexity. Other: Visualized mastoids are clear. CT MAXILLOFACIAL FINDINGS Osseous: Zygomatic arches intact. No acute maxillary fracture. Pterygoid plates intact. Nasal bones intact. S-shaped deviation of the nasal septum without fracture. Mandible intact. Mandibular condyles normally situated. No acute abnormality about the dentition. Orbits: Globes orbital soft tissues within normal limits. Ocular senescent calcifications noted. Bony orbits intact. Sinuses: Mild scattered mucosal thickening within the left posterior ethmoidal air cells. Paranasal sinuses are otherwise clear. No hemosinus. Soft tissues: Soft tissue contusion seen involving the right face and periorbital soft tissues. Contusion/laceration at the right forehead. CT CERVICAL SPINE FINDINGS Alignment: Vertebral bodies normally aligned with preservation of the  normal cervical lordosis. Trace degenerative anterolisthesis of C7 on T1 and T1 on T2. Skull base and vertebrae: Skull base intact. Normal C1-2 articulations are preserved. Dens intact. There is an acute minimally displaced fracture through the anterior inferior aspect of the C2 vertebral body (series 13, image 29). Additional acute vertically oriented fracture extends through the anterior aspect of the C3 vertebral body, with extension through both superior and inferior endplates (series 13, image 30). Vertebral body heights maintained. No other acute fracture identified. Soft tissues and spinal canal: Paraspinous soft tissues demonstrate no acute finding. Spinal canal within normal limits. No significant prevertebral edema at this time. Vascular calcifications about the carotid bifurcations. Disc levels: Moderate degenerative spondylolysis present at C3-4 through C6-7. Multilevel facet arthrosis, most notable at C2-3 on the left. Upper chest: Visualized upper chest demonstrates no acute finding. Other: None. IMPRESSION: CT HEAD: 1. No acute intracranial abnormality identified. 2. Soft tissue laceration at the right forehead. No calvarial fracture. 3. Generalized age-related cerebral atrophy with moderate chronic microvascular ischemic disease. CT MAXILLOFACIAL: 1. Soft tissue contusion involving the right periorbital soft tissues and right face. 2. No other acute maxillofacial injury. Intact globes with no retro-orbital pathology. No fracture. CT CERVICAL SPINE: 1. Acute fractures involving the anterior/inferior C2 vertebral body, with additional vertically oriented fracture through the anterior C3 vertebral body. Given the fracture locations, possible anterior longitudinal ligament injury should be considered. No listhesis or malalignment. 2. Moderate cervical spondylolysis at C3-4 through C6-7. Critical Value/emergent results were called by telephone at the time of interpretation on 11/28/2018 at 2:01 am to the  emergency room physician assistant, Joline Maxcy who verbally acknowledged these results. Electronically Signed   By: Jeannine Boga M.D.   On: 11/28/2018 02:11   Ct Maxillofacial Wo Contrast  Result Date: 11/28/2018 CLINICAL DATA:  Initial evaluation for acute trauma, fall. EXAM: CT HEAD WITHOUT CONTRAST CT MAXILLOFACIAL WITHOUT CONTRAST CT CERVICAL SPINE WITHOUT CONTRAST TECHNIQUE: Multidetector CT imaging of the head, cervical spine, and maxillofacial structures were performed using the standard protocol without intravenous contrast. Multiplanar CT image reconstructions of the cervical spine and maxillofacial structures were also generated. COMPARISON:  Prior CT from 09/04/2018. FINDINGS: CT HEAD FINDINGS Brain: Diffuse prominence of the CSF containing spaces compatible with generalized age-related cerebral atrophy. Confluent hypodensity within the periventricular and deep white matter both cerebral hemispheres most consistent with chronic microvascular ischemic disease, moderate in nature. Superimposed small remote left cerebellar infarct, with additional chronic bilateral basal ganglia lacunar infarcts. No acute intracranial hemorrhage. No acute large vessel territory infarct. No mass lesion, midline shift or mass effect. Diffuse ventricular prominence related to global parenchymal volume loss of hydrocephalus. No extra-axial fluid collection. Vascular: No hyperdense vessel. Calcified atherosclerosis at the skull base. Skull: Small right frontal  scalp contusion/laceration. Calvarium intact. Focal exostosis noted overlying the right frontal convexity. Other: Visualized mastoids are clear. CT MAXILLOFACIAL FINDINGS Osseous: Zygomatic arches intact. No acute maxillary fracture. Pterygoid plates intact. Nasal bones intact. S-shaped deviation of the nasal septum without fracture. Mandible intact. Mandibular condyles normally situated. No acute abnormality about the dentition. Orbits: Globes orbital soft  tissues within normal limits. Ocular senescent calcifications noted. Bony orbits intact. Sinuses: Mild scattered mucosal thickening within the left posterior ethmoidal air cells. Paranasal sinuses are otherwise clear. No hemosinus. Soft tissues: Soft tissue contusion seen involving the right face and periorbital soft tissues. Contusion/laceration at the right forehead. CT CERVICAL SPINE FINDINGS Alignment: Vertebral bodies normally aligned with preservation of the normal cervical lordosis. Trace degenerative anterolisthesis of C7 on T1 and T1 on T2. Skull base and vertebrae: Skull base intact. Normal C1-2 articulations are preserved. Dens intact. There is an acute minimally displaced fracture through the anterior inferior aspect of the C2 vertebral body (series 13, image 29). Additional acute vertically oriented fracture extends through the anterior aspect of the C3 vertebral body, with extension through both superior and inferior endplates (series 13, image 30). Vertebral body heights maintained. No other acute fracture identified. Soft tissues and spinal canal: Paraspinous soft tissues demonstrate no acute finding. Spinal canal within normal limits. No significant prevertebral edema at this time. Vascular calcifications about the carotid bifurcations. Disc levels: Moderate degenerative spondylolysis present at C3-4 through C6-7. Multilevel facet arthrosis, most notable at C2-3 on the left. Upper chest: Visualized upper chest demonstrates no acute finding. Other: None. IMPRESSION: CT HEAD: 1. No acute intracranial abnormality identified. 2. Soft tissue laceration at the right forehead. No calvarial fracture. 3. Generalized age-related cerebral atrophy with moderate chronic microvascular ischemic disease. CT MAXILLOFACIAL: 1. Soft tissue contusion involving the right periorbital soft tissues and right face. 2. No other acute maxillofacial injury. Intact globes with no retro-orbital pathology. No fracture. CT  CERVICAL SPINE: 1. Acute fractures involving the anterior/inferior C2 vertebral body, with additional vertically oriented fracture through the anterior C3 vertebral body. Given the fracture locations, possible anterior longitudinal ligament injury should be considered. No listhesis or malalignment. 2. Moderate cervical spondylolysis at C3-4 through C6-7. Critical Value/emergent results were called by telephone at the time of interpretation on 11/28/2018 at 2:01 am to the emergency room physician assistant, Joline Maxcy who verbally acknowledged these results. Electronically Signed   By: Jeannine Boga M.D.   On: 11/28/2018 02:11    Assessment/Plan Weight loss #5-6Ibs weight loss in the past month, she denied GI symptoms, will update CBC/diff, CMP/eGFR, TSH. F/u dietary.   Hypertension blood pressure is controlled, continue  Metoprolol 73m bid, Losartan 1048mqd, Amlodipine 1055md.  Hypothyroidism Hypothyroidism, continue Levothyroxine 10m70md, last TSH wnl 05/2018   Vascular dementia with behavior disturbance (HCC)[facility] for [facility] and care [facility], w/c for mobility, her mood is stable, continue  Quetiapine 25mg1m     Family/ staff Communication: plan of care reviewed with the patient and charge nurse.   Labs/tests ordered: CBC/diff, CMP/eGFR, TSH  Time spend 25 minutes.

## 2018-12-18 NOTE — Assessment & Plan Note (Signed)
Hypothyroidism, continue Levothyroxine 72mcg qd, last TSH wnl 05/2018

## 2018-12-21 ENCOUNTER — Non-Acute Institutional Stay (SKILLED_NURSING_FACILITY): Payer: Medicare Other | Admitting: Internal Medicine

## 2018-12-21 ENCOUNTER — Encounter: Payer: Self-pay | Admitting: Internal Medicine

## 2018-12-21 DIAGNOSIS — R55 Syncope and collapse: Secondary | ICD-10-CM

## 2018-12-21 DIAGNOSIS — I10 Essential (primary) hypertension: Secondary | ICD-10-CM | POA: Diagnosis not present

## 2018-12-21 DIAGNOSIS — F01518 Vascular dementia, unspecified severity, with other behavioral disturbance: Secondary | ICD-10-CM

## 2018-12-21 DIAGNOSIS — F0151 Vascular dementia with behavioral disturbance: Secondary | ICD-10-CM

## 2018-12-21 DIAGNOSIS — R634 Abnormal weight loss: Secondary | ICD-10-CM | POA: Diagnosis not present

## 2018-12-21 NOTE — Progress Notes (Signed)
Location:  [place] Room Number: 2 Place of Service:  SNF [phone]) Provider:  Veleta Miners  MD  Virgie Dad, MD  Patient Care Team: Virgie Dad, MD as PCP - General (Internal Medicine) Mast, Man X, NP as Nurse Practitioner (Nurse Practitioner) Charlotte Crumb, MD as Consulting Physician (Orthopedic Surgery) Regal, Tamala Fothergill, DPM as Consulting Physician (Podiatry) Gaynelle Arabian, MD as Consulting Physician (Orthopedic Surgery) Zebedee Iba., MD as Referring Physician (Ophthalmology) Newman Pies, MD as Consulting Physician (Neurosurgery) Melina Modena, [facility] (Skilled Nursing and [place]) Mast, Man X, NP as Nurse Practitioner (Internal Medicine) Ngetich, Nelda Bucks, NP as Nurse Practitioner (Family Medicine)  Extended Emergency Contact Information Primary Emergency Contact: Tal,[name] Address: [place], [place] [address] Johnnette Litter of [place] Phone: [phone] Mobile Phone: [phone] Relation: Daughter Secondary Emergency Contact: [place] of [place] Phone: [phone] Relation: Grandson  Code Status:  DNR Goals of care: Advanced Directive information Advanced Directives 12/21/2018  Does Patient Have a Medical Advance Directive? Yes  Type of Paramedic of [place];Living will;Out of facility DNR (pink MOST or yellow form)  Does patient want to make changes to medical advance directive? No - Patient declined  Copy of [place] in Chart? Yes - validated most recent copy scanned in chart (See row information)  Pre-existing out of facility DNR order (yellow form or pink MOST form) Yellow form placed in chart (order not valid for inpatient use);Pink MOST form placed in chart (order not valid for inpatient use)     Chief Complaint  Patient presents with   Acute Visit    C/o - changes in mental status    HPI:  Pt is a  85 y.o. female seen today for an acute visit for Episode of Low BP and Pulse with unresponsiveness  Patient has h/o Hypertension, Hypothyroidism, Dementia with Behavior issues and Osteoporosis And Recurrent Falls And recently Weight Loss with Failure to thrive  View days ago patient had episode in which she became unresponsive.  At that time her Systolic blood pressure was 50 and pulse was 34.  Her daughter was called who decided that patient needs to be comfort care. Since then patient is back to her baseline.  She continues to resist care so we are unable to get another set of vitals on her.  Her daughter wants her to be referred to hospice Patient unable to give any history due to her dementia She is comfortable.  She has lost almost 12 pounds in past few months.  Per nurses she does not eat and does not let them feed her  Past Medical History:  Diagnosis Date   Arthritis    Cataracts, bilateral    Closed fracture of unspecified part of lower end of humerus    Dementia ([place])    Hypertension    Insomnia, unspecified    Macular degeneration (senile) of retina, unspecified    Osteoarthritis    Osteoarthrosis, unspecified whether generalized or localized, unspecified site    Pancreatitis, acute 08/14/2013   Retinal neovascularization NOS    Senile osteoporosis    Subarachnoid hemorrhage following injury ([place]) 03/31/2012   Unspecified hearing loss    Unspecified hypothyroidism    Urinary tract infection 08/10/2013   Vertebral fracture 08/09/2013   T7 10%, T12 70%    Past Surgical History:  Procedure Laterality Date   ABDOMINAL  HYSTERECTOMY  1942   ANKLE FRACTURE SURGERY  1960   BLADDER SUSPENSION     CLOSED REDUCTION WITH HUMERAL PIN INSERTION  04/01/2012   Procedure: CLOSED REDUCTION WITH HUMERAL PIN INSERTION;  Surgeon: Schuyler Amor, MD;  Location: [facility];  Service: Orthopedics;  Laterality: Left;    No Known Allergies  Outpatient Encounter Medications  as of 12/21/2018  Medication Sig   amLODipine (NORVASC) 10 MG tablet Take 10 mg by mouth daily.   aspirin 81 MG chewable tablet Chew 81 mg by mouth daily.   Cholecalciferol 1000 units capsule Take 2,000 Units by mouth daily.    levothyroxine (SYNTHROID, LEVOTHROID) 88 MCG tablet Take 88 mcg by mouth daily before breakfast.   losartan (COZAAR) 100 MG tablet Take 1 tablet (100 mg total) by mouth daily.   metoprolol tartrate (LOPRESSOR) 50 MG tablet Take 50 mg by mouth 2 (two) times daily.   mirtazapine (REMERON) 15 MG tablet Take 15 mg by mouth at bedtime.   Nutritional Supplements (BOOST GLUCOSE CONTROL) LIQD Take 237 mLs by mouth 2 (two) times daily between meals.   polyethylene glycol powder (GLYCOLAX/MIRALAX) powder Take 17 g by mouth daily. Hold for loose stool   QUEtiapine (SEROQUEL) 25 MG tablet Take 25 mg by mouth at bedtime.   raloxifene (EVISTA) 60 MG tablet Take 60 mg by mouth daily.   No facility-administered encounter medications on file as of 12/21/2018.     Review of Systems  Unable to perform ROS: Dementia    Immunization History  Administered Date(s) Administered   Influenza-Unspecified 03/02/2013, 02/15/2014, 02/16/2015, 02/22/2016, 02/20/2017, 02/23/2018   PPD Test 03/23/2012   Pneumococcal Conjugate-13 12/17/2016   Pneumococcal Polysaccharide-23 09/22/2002   Td 11/17/2002   Tdap 12/16/2016   Pertinent  Health Maintenance Due  Topic Date Due   INFLUENZA VACCINE  12/12/2018   DEXA SCAN  Completed   PNA vac Low Risk Adult  Completed   Fall Risk  12/24/2017 12/13/2016 02/07/2015 02/22/2014 02/22/2014  Falls in the past year? Yes No No Yes No  Number falls in past yr: 1 - - 1 -  Injury with Fall? Yes - - - -  Risk for fall due to : - - - History of fall(s) -   Functional Status Survey:    Vitals:   12/21/18 1455  BP: (!) 53/42  Pulse: (!) 34  Resp: (!) 22  Temp: (!) 97.2 F (36.2 C)  SpO2: 97%  Weight: 112 lb 12.8 oz (51.2 kg)  Height:  5\' 1"  (1.549 m)   Body mass index is 21.31 kg/m. Physical Exam Vitals signs reviewed.  HENT:     Head: Normocephalic.     Nose: Nose normal.     Mouth/Throat:     Mouth: Mucous membranes are moist.     Pharynx: Oropharynx is clear.  Neck:     Musculoskeletal: Neck supple.  Cardiovascular:     Rate and Rhythm: Normal rate.     Pulses: Normal pulses.  Pulmonary:     Effort: Pulmonary effort is normal.     Breath sounds: Normal breath sounds.  Abdominal:     General: Abdomen is flat. Bowel sounds are normal.     Palpations: Abdomen is soft.  Musculoskeletal:        General: No swelling.  Skin:    General: Skin is warm and dry.  Neurological:     General: No focal deficit present.     Mental Status: She is alert.  Psychiatric:  Mood and Affect: Mood normal.        Thought Content: Thought content normal.        Judgment: Judgment normal.     Labs reviewed: Recent Labs    01/19/18 05/21/18 09/01/18  NA 139 139 139  K 4.1 4.6 4.2  CL  --  105  --   CO2  --  23  --   BUN 24* 28* 28*  CREATININE 0.66 0.7 0.7  CALCIUM 8.8 9.1  --    Recent Labs    01/19/18 05/21/18  AST 11 14  ALT 10 11  ALKPHOS 37 51  BILITOT 0.6  --   PROT 5.5 6.4  ALBUMIN 3 3.5   Recent Labs    05/21/18 09/01/18 09/07/18  WBC 11.8 14.6 9.0  HGB 12.9 13.4 12.4  HCT 38 39 36  PLT 223 201 240   Lab Results  Component Value Date   TSH 3.45 05/21/2018   Lab Results  Component Value Date   HGBA1C 6.7 07/18/2016   Lab Results  Component Value Date   CHOL 146 01/23/2017   HDL 39 01/23/2017   LDLCALC 76 01/23/2017   TRIG 214 (A) 01/23/2017    Significant Diagnostic Results in last 30 days:  Ct Head Wo Contrast  Result Date: 11/28/2018 CLINICAL DATA:  Initial evaluation for acute trauma, fall. EXAM: CT HEAD WITHOUT CONTRAST CT MAXILLOFACIAL WITHOUT CONTRAST CT CERVICAL SPINE WITHOUT CONTRAST TECHNIQUE: Multidetector CT imaging of the head, cervical spine, and maxillofacial  structures were performed using the standard protocol without intravenous contrast. Multiplanar CT image reconstructions of the cervical spine and maxillofacial structures were also generated. COMPARISON:  Prior CT from 09/04/2018. FINDINGS: CT HEAD FINDINGS Brain: Diffuse prominence of the CSF containing spaces compatible with generalized age-related cerebral atrophy. Confluent hypodensity within the periventricular and deep white matter both cerebral hemispheres most consistent with chronic microvascular ischemic disease, moderate in nature. Superimposed small remote left cerebellar infarct, with additional chronic bilateral basal ganglia lacunar infarcts. No acute intracranial hemorrhage. No acute large vessel territory infarct. No mass lesion, midline shift or mass effect. Diffuse ventricular prominence related to global parenchymal volume loss of hydrocephalus. No extra-axial fluid collection. Vascular: No hyperdense vessel. Calcified atherosclerosis at the skull base. Skull: Small right frontal scalp contusion/laceration. Calvarium intact. Focal exostosis noted overlying the right frontal convexity. Other: Visualized mastoids are clear. CT MAXILLOFACIAL FINDINGS Osseous: Zygomatic arches intact. No acute maxillary fracture. Pterygoid plates intact. Nasal bones intact. S-shaped deviation of the nasal septum without fracture. Mandible intact. Mandibular condyles normally situated. No acute abnormality about the dentition. Orbits: Globes orbital soft tissues within normal limits. Ocular senescent calcifications noted. Bony orbits intact. Sinuses: Mild scattered mucosal thickening within the left posterior ethmoidal air cells. Paranasal sinuses are otherwise clear. No hemosinus. Soft tissues: Soft tissue contusion seen involving the right face and periorbital soft tissues. Contusion/laceration at the right forehead. CT CERVICAL SPINE FINDINGS Alignment: Vertebral bodies normally aligned with preservation of the  normal cervical lordosis. Trace degenerative anterolisthesis of C7 on T1 and T1 on T2. Skull base and vertebrae: Skull base intact. Normal C1-2 articulations are preserved. Dens intact. There is an acute minimally displaced fracture through the anterior inferior aspect of the C2 vertebral body (series 13, image 29). Additional acute vertically oriented fracture extends through the anterior aspect of the C3 vertebral body, with extension through both superior and inferior endplates (series 13, image 30). Vertebral body heights maintained. No other acute fracture identified. Soft tissues and  spinal canal: Paraspinous soft tissues demonstrate no acute finding. Spinal canal within normal limits. No significant prevertebral edema at this time. Vascular calcifications about the carotid bifurcations. Disc levels: Moderate degenerative spondylolysis present at C3-4 through C6-7. Multilevel facet arthrosis, most notable at C2-3 on the left. Upper chest: Visualized upper chest demonstrates no acute finding. Other: None. IMPRESSION: CT HEAD: 1. No acute intracranial abnormality identified. 2. Soft tissue laceration at the right forehead. No calvarial fracture. 3. Generalized age-related cerebral atrophy with moderate chronic microvascular ischemic disease. CT MAXILLOFACIAL: 1. Soft tissue contusion involving the right periorbital soft tissues and right face. 2. No other acute maxillofacial injury. Intact globes with no retro-orbital pathology. No fracture. CT CERVICAL SPINE: 1. Acute fractures involving the anterior/inferior C2 vertebral body, with additional vertically oriented fracture through the anterior C3 vertebral body. Given the fracture locations, possible anterior longitudinal ligament injury should be considered. No listhesis or malalignment. 2. Moderate cervical spondylolysis at C3-4 through C6-7. Critical Value/emergent results were called by telephone at the time of interpretation on 11/28/2018 at 2:01 am to the  emergency room physician assistant, Joline Maxcy who verbally acknowledged these results. Electronically Signed   By: Jeannine Boga M.D.   On: 11/28/2018 02:11   Ct Cervical Spine Wo Contrast  Result Date: 11/28/2018 CLINICAL DATA:  Initial evaluation for acute trauma, fall. EXAM: CT HEAD WITHOUT CONTRAST CT MAXILLOFACIAL WITHOUT CONTRAST CT CERVICAL SPINE WITHOUT CONTRAST TECHNIQUE: Multidetector CT imaging of the head, cervical spine, and maxillofacial structures were performed using the standard protocol without intravenous contrast. Multiplanar CT image reconstructions of the cervical spine and maxillofacial structures were also generated. COMPARISON:  Prior CT from 09/04/2018. FINDINGS: CT HEAD FINDINGS Brain: Diffuse prominence of the CSF containing spaces compatible with generalized age-related cerebral atrophy. Confluent hypodensity within the periventricular and deep white matter both cerebral hemispheres most consistent with chronic microvascular ischemic disease, moderate in nature. Superimposed small remote left cerebellar infarct, with additional chronic bilateral basal ganglia lacunar infarcts. No acute intracranial hemorrhage. No acute large vessel territory infarct. No mass lesion, midline shift or mass effect. Diffuse ventricular prominence related to global parenchymal volume loss of hydrocephalus. No extra-axial fluid collection. Vascular: No hyperdense vessel. Calcified atherosclerosis at the skull base. Skull: Small right frontal scalp contusion/laceration. Calvarium intact. Focal exostosis noted overlying the right frontal convexity. Other: Visualized mastoids are clear. CT MAXILLOFACIAL FINDINGS Osseous: Zygomatic arches intact. No acute maxillary fracture. Pterygoid plates intact. Nasal bones intact. S-shaped deviation of the nasal septum without fracture. Mandible intact. Mandibular condyles normally situated. No acute abnormality about the dentition. Orbits: Globes orbital soft  tissues within normal limits. Ocular senescent calcifications noted. Bony orbits intact. Sinuses: Mild scattered mucosal thickening within the left posterior ethmoidal air cells. Paranasal sinuses are otherwise clear. No hemosinus. Soft tissues: Soft tissue contusion seen involving the right face and periorbital soft tissues. Contusion/laceration at the right forehead. CT CERVICAL SPINE FINDINGS Alignment: Vertebral bodies normally aligned with preservation of the normal cervical lordosis. Trace degenerative anterolisthesis of C7 on T1 and T1 on T2. Skull base and vertebrae: Skull base intact. Normal C1-2 articulations are preserved. Dens intact. There is an acute minimally displaced fracture through the anterior inferior aspect of the C2 vertebral body (series 13, image 29). Additional acute vertically oriented fracture extends through the anterior aspect of the C3 vertebral body, with extension through both superior and inferior endplates (series 13, image 30). Vertebral body heights maintained. No other acute fracture identified. Soft tissues and spinal canal: Paraspinous soft tissues  demonstrate no acute finding. Spinal canal within normal limits. No significant prevertebral edema at this time. Vascular calcifications about the carotid bifurcations. Disc levels: Moderate degenerative spondylolysis present at C3-4 through C6-7. Multilevel facet arthrosis, most notable at C2-3 on the left. Upper chest: Visualized upper chest demonstrates no acute finding. Other: None. IMPRESSION: CT HEAD: 1. No acute intracranial abnormality identified. 2. Soft tissue laceration at the right forehead. No calvarial fracture. 3. Generalized age-related cerebral atrophy with moderate chronic microvascular ischemic disease. CT MAXILLOFACIAL: 1. Soft tissue contusion involving the right periorbital soft tissues and right face. 2. No other acute maxillofacial injury. Intact globes with no retro-orbital pathology. No fracture. CT  CERVICAL SPINE: 1. Acute fractures involving the anterior/inferior C2 vertebral body, with additional vertically oriented fracture through the anterior C3 vertebral body. Given the fracture locations, possible anterior longitudinal ligament injury should be considered. No listhesis or malalignment. 2. Moderate cervical spondylolysis at C3-4 through C6-7. Critical Value/emergent results were called by telephone at the time of interpretation on 11/28/2018 at 2:01 am to the emergency room physician assistant, Joline Maxcy who verbally acknowledged these results. Electronically Signed   By: Jeannine Boga M.D.   On: 11/28/2018 02:11   Ct Maxillofacial Wo Contrast  Result Date: 11/28/2018 CLINICAL DATA:  Initial evaluation for acute trauma, fall. EXAM: CT HEAD WITHOUT CONTRAST CT MAXILLOFACIAL WITHOUT CONTRAST CT CERVICAL SPINE WITHOUT CONTRAST TECHNIQUE: Multidetector CT imaging of the head, cervical spine, and maxillofacial structures were performed using the standard protocol without intravenous contrast. Multiplanar CT image reconstructions of the cervical spine and maxillofacial structures were also generated. COMPARISON:  Prior CT from 09/04/2018. FINDINGS: CT HEAD FINDINGS Brain: Diffuse prominence of the CSF containing spaces compatible with generalized age-related cerebral atrophy. Confluent hypodensity within the periventricular and deep white matter both cerebral hemispheres most consistent with chronic microvascular ischemic disease, moderate in nature. Superimposed small remote left cerebellar infarct, with additional chronic bilateral basal ganglia lacunar infarcts. No acute intracranial hemorrhage. No acute large vessel territory infarct. No mass lesion, midline shift or mass effect. Diffuse ventricular prominence related to global parenchymal volume loss of hydrocephalus. No extra-axial fluid collection. Vascular: No hyperdense vessel. Calcified atherosclerosis at the skull base. Skull: Small  right frontal scalp contusion/laceration. Calvarium intact. Focal exostosis noted overlying the right frontal convexity. Other: Visualized mastoids are clear. CT MAXILLOFACIAL FINDINGS Osseous: Zygomatic arches intact. No acute maxillary fracture. Pterygoid plates intact. Nasal bones intact. S-shaped deviation of the nasal septum without fracture. Mandible intact. Mandibular condyles normally situated. No acute abnormality about the dentition. Orbits: Globes orbital soft tissues within normal limits. Ocular senescent calcifications noted. Bony orbits intact. Sinuses: Mild scattered mucosal thickening within the left posterior ethmoidal air cells. Paranasal sinuses are otherwise clear. No hemosinus. Soft tissues: Soft tissue contusion seen involving the right face and periorbital soft tissues. Contusion/laceration at the right forehead. CT CERVICAL SPINE FINDINGS Alignment: Vertebral bodies normally aligned with preservation of the normal cervical lordosis. Trace degenerative anterolisthesis of C7 on T1 and T1 on T2. Skull base and vertebrae: Skull base intact. Normal C1-2 articulations are preserved. Dens intact. There is an acute minimally displaced fracture through the anterior inferior aspect of the C2 vertebral body (series 13, image 29). Additional acute vertically oriented fracture extends through the anterior aspect of the C3 vertebral body, with extension through both superior and inferior endplates (series 13, image 30). Vertebral body heights maintained. No other acute fracture identified. Soft tissues and spinal canal: Paraspinous soft tissues demonstrate no acute finding. Spinal canal  within normal limits. No significant prevertebral edema at this time. Vascular calcifications about the carotid bifurcations. Disc levels: Moderate degenerative spondylolysis present at C3-4 through C6-7. Multilevel facet arthrosis, most notable at C2-3 on the left. Upper chest: Visualized upper chest demonstrates no acute  finding. Other: None. IMPRESSION: CT HEAD: 1. No acute intracranial abnormality identified. 2. Soft tissue laceration at the right forehead. No calvarial fracture. 3. Generalized age-related cerebral atrophy with moderate chronic microvascular ischemic disease. CT MAXILLOFACIAL: 1. Soft tissue contusion involving the right periorbital soft tissues and right face. 2. No other acute maxillofacial injury. Intact globes with no retro-orbital pathology. No fracture. CT CERVICAL SPINE: 1. Acute fractures involving the anterior/inferior C2 vertebral body, with additional vertically oriented fracture through the anterior C3 vertebral body. Given the fracture locations, possible anterior longitudinal ligament injury should be considered. No listhesis or malalignment. 2. Moderate cervical spondylolysis at C3-4 through C6-7. Critical Value/emergent results were called by telephone at the time of interpretation on 11/28/2018 at 2:01 am to the emergency room physician assistant, Joline Maxcy who verbally acknowledged these results. Electronically Signed   By: Jeannine Boga M.D.   On: 11/28/2018 02:11    Assessment/Plan Syncopal episode with hypotension and bradycardia Patient does not let us do vitals on her most of the time At this time we will decrease the Lopressor to 25 mg twice daily No aggressive work-up Referred to hospice Essential hypertension Patient resists care sometimes and does not let her check her vitals Will continue her on Norvasc and losartan Decrease the dose of Lopressor Weight loss Continue on Remeron for now Weight loss is most likely due to continuation of her dementia Hypothyroidism Continue Synthroid Dementia with behavioral disturbances Continue Seroquel Family/ staff Communication:   Labs/tests ordered:  Labs Pending  Total time spent in this patient care encounter was  _25  minutes; greater than 50% of the visit spent counseling patient and staff, reviewing records ,  Labs and coordinating care for problems addressed at this encounter.

## 2018-12-29 ENCOUNTER — Other Ambulatory Visit: Payer: Self-pay | Admitting: *Deleted

## 2018-12-29 MED ORDER — MORPHINE SULFATE 10 MG/5ML PO SOLN: ORAL | 0 refills | Status: DC

## 2018-12-29 NOTE — Telephone Encounter (Signed)
Received fax from [facility] Rx and sent to [facility] for approval.

## 2018-12-30 ENCOUNTER — Other Ambulatory Visit: Payer: Self-pay | Admitting: Internal Medicine

## 2018-12-30 MED ORDER — MORPHINE SULFATE (CONCENTRATE) 20 MG/ML PO SOLN: ORAL | 0 refills | Status: DC

## 2018-12-30 MED ORDER — MORPHINE SULFATE (CONCENTRATE) 20 MG/ML PO SOLN: ORAL | 0 refills | Status: AC

## 2018-12-30 NOTE — Addendum Note (Signed)
Addended by: Rafael Bihari A on: 12/30/2018 09:39 AM   Modules accepted: Orders

## 2018-12-30 NOTE — Telephone Encounter (Signed)
Received fax from [facility] stating to resend a Prescription with the correct strength of Morphine 100mg /43ml (Roxanol 20mg /ml) as per hand written order that was faxed over from [facility] earlier.   Resent to [facility] for approval.

## 2019-01-15 ENCOUNTER — Non-Acute Institutional Stay (SKILLED_NURSING_FACILITY): Payer: Medicare Other | Admitting: Nurse Practitioner

## 2019-01-15 ENCOUNTER — Encounter: Payer: Self-pay | Admitting: Nurse Practitioner

## 2019-01-15 DIAGNOSIS — M159 Polyosteoarthritis, unspecified: Secondary | ICD-10-CM

## 2019-01-15 DIAGNOSIS — M150 Primary generalized (osteo)arthritis: Secondary | ICD-10-CM | POA: Diagnosis not present

## 2019-01-15 DIAGNOSIS — F0151 Vascular dementia with behavioral disturbance: Secondary | ICD-10-CM | POA: Diagnosis not present

## 2019-01-15 DIAGNOSIS — R627 Adult failure to thrive: Secondary | ICD-10-CM

## 2019-01-15 DIAGNOSIS — F01518 Vascular dementia, unspecified severity, with other behavioral disturbance: Secondary | ICD-10-CM

## 2019-01-15 NOTE — Assessment & Plan Note (Signed)
Supportive care, continue Hospice service.

## 2019-01-15 NOTE — Assessment & Plan Note (Signed)
She appears no pain, continue prn Morphine 5mg  q4h prn. Observe.

## 2019-01-15 NOTE — Assessment & Plan Note (Addendum)
Continue SNF [facility] for safety, care assistance, ambulates with walker. Palliative care. Prn Lorazepam 0.5mg  q4hr available to her.

## 2019-01-15 NOTE — Progress Notes (Signed)
Location:  [place] Room Number: 2 Place of Service:  SNF [phone]) Provider:  Veleta Miners  MD  Virgie Dad, MD  Patient Care Team: Virgie Dad, MD as PCP - General (Internal Medicine) Starlit Raburn X, NP as Nurse Practitioner (Nurse Practitioner) Charlotte Crumb, MD as Consulting Physician (Orthopedic Surgery) Regal, Tamala Fothergill, DPM as Consulting Physician (Podiatry) Gaynelle Arabian, MD as Consulting Physician (Orthopedic Surgery) Zebedee Iba., MD as Referring Physician (Ophthalmology) Newman Pies, MD as Consulting Physician (Neurosurgery) Melina Modena, [facility] (Skilled Nursing and [place]) Llesenia Fogal X, NP as Nurse Practitioner (Internal Medicine) Ngetich, Nelda Bucks, NP as Nurse Practitioner (Family Medicine)  Extended Emergency Contact Information Primary Emergency Contact: Haubner,[name] Address: [place], [place] [address] Johnnette Litter of [place] Phone: [phone] Mobile Phone: [phone] Relation: Daughter Secondary Emergency Contact: [place] of [place] Phone: [phone] Relation: Grandson  Code Status:  DNR Goals of care: Advanced Directive information Advanced Directives 01/19/2019  Does Patient Have a Medical Advance Directive? Yes  Type of Advance Directive Out of facility DNR (pink MOST or yellow form)  Does patient want to make changes to medical advance directive? No - Patient declined  Copy of [place] in Chart? -  Pre-existing out of facility DNR order (yellow form or pink MOST form) Pink MOST form placed in chart (order not valid for inpatient use)     Chief Complaint  Patient presents with  . Medical Management of Chronic Issues    HPI:  Pt is a 85 y.o. female seen today for medical management of chronic diseases.    The patient resides in SN[facility] for safety, care assistance, ambulates with walker, last walked from her room  to front desk 01/14/19. Her goal of care is comfort measures, Morphine 5mg  q4h prn, Lorazepam 0.5mg  q4h prn available to her.    Past Medical History:  Diagnosis Date  . Arthritis   . Cataracts, bilateral   . Closed fracture of unspecified part of lower end of humerus   . Dementia ([place])   . Hypertension   . Insomnia, unspecified   . Macular degeneration (senile) of retina, unspecified   . Osteoarthritis   . Osteoarthrosis, unspecified whether generalized or localized, unspecified site   . Pancreatitis, acute 08/14/2013  . Retinal neovascularization NOS   . Senile osteoporosis   . Subarachnoid hemorrhage following injury ([place]) 03/31/2012  . Unspecified hearing loss   . Unspecified hypothyroidism   . Urinary tract infection 08/10/2013  . Vertebral fracture 08/09/2013   T7 10%, T12 70%    Past Surgical History:  Procedure Laterality Date  . ABDOMINAL HYSTERECTOMY  1942  . ANKLE FRACTURE SURGERY  1960  . BLADDER SUSPENSION    . CLOSED REDUCTION WITH HUMERAL PIN INSERTION  04/01/2012   Procedure: CLOSED REDUCTION WITH HUMERAL PIN INSERTION;  Surgeon: Schuyler Amor, MD;  [facility]: [facility];  Service: Orthopedics;  Laterality: Left;    No Known Allergies  Outpatient Encounter Medications as of 01/15/2019  Medication Sig  . ketoconazole (NIZORAL) 2 % shampoo Apply 1 application topically 2 (two) times a week. Send shampoo with Daughter to beauty shop with resident q week on Wednesday  . LORazepam (ATIVAN) 0.5 MG tablet Take 0.5 mg by mouth every 4 (four) hours as needed for anxiety.  Marland Kitchen morphine (ROXANOL) 20 MG/ML concentrated solution Give 0.46ml by mouth every four  hours as needed for pain/dyspnea  . [DISCONTINUED] amLODipine (NORVASC) 10 MG tablet Take 10 mg by mouth daily.  . [DISCONTINUED] aspirin 81 MG chewable tablet Chew 81 mg by mouth daily.  . [DISCONTINUED] Cholecalciferol 1000 units capsule Take 2,000 Units by mouth daily.   . [DISCONTINUED] levothyroxine (SYNTHROID,  LEVOTHROID) 88 MCG tablet Take 88 mcg by mouth daily before breakfast.  . [DISCONTINUED] losartan (COZAAR) 100 MG tablet Take 1 tablet (100 mg total) by mouth daily.  . [DISCONTINUED] metoprolol tartrate (LOPRESSOR) 50 MG tablet Take 50 mg by mouth 2 (two) times daily.  . [DISCONTINUED] mirtazapine (REMERON) 15 MG tablet Take 15 mg by mouth at bedtime.  . [DISCONTINUED] Nutritional Supplements (BOOST GLUCOSE CONTROL) LIQD Take 237 mLs by mouth 2 (two) times daily between meals.  . [DISCONTINUED] polyethylene glycol powder (GLYCOLAX/MIRALAX) powder Take 17 g by mouth daily. Hold for loose stool  . [DISCONTINUED] QUEtiapine (SEROQUEL) 25 MG tablet Take 25 mg by mouth at bedtime.  . [DISCONTINUED] raloxifene (EVISTA) 60 MG tablet Take 60 mg by mouth daily.   No facility-administered encounter medications on file as of 01/15/2019.    ROS was provided with assistance of staff Review of Systems  Constitutional: Positive for appetite change, fatigue and unexpected weight change. Negative for activity change, chills, diaphoresis and fever.       Gradual weight loss  HENT: Positive for hearing loss. Negative for congestion and voice change.   Respiratory: Negative for cough, shortness of breath and wheezing.   Cardiovascular: Negative for chest pain, palpitations and leg swelling.  Gastrointestinal: Negative for abdominal distention, abdominal pain, constipation, diarrhea, nausea and vomiting.  Genitourinary: Negative for difficulty urinating, dysuria and urgency.  Musculoskeletal: Positive for arthralgias and gait problem.  Skin: Positive for color change. Negative for pallor.  Neurological: Negative for dizziness, speech difficulty, weakness and headaches.       Dementia  Psychiatric/Behavioral: Negative for agitation, behavioral problems, hallucinations and sleep disturbance. The patient is not nervous/anxious.     Immunization History  Administered Date(s) Administered  . Influenza-Unspecified  03/02/2013, 02/15/2014, 02/16/2015, 02/22/2016, 02/20/2017, 02/23/2018  . PPD Test 03/23/2012  . Pneumococcal Conjugate-13 12/17/2016  . Pneumococcal Polysaccharide-23 09/22/2002  . Td 11/17/2002  . Tdap 12/16/2016   Pertinent  Health Maintenance Due  Topic Date Due  . INFLUENZA VACCINE  02/18/2019 (Originally 12/12/2018)  . DEXA SCAN  Completed  . PNA vac Low Risk Adult  Completed   Fall Risk  12/24/2017 12/13/2016 02/07/2015 02/22/2014 02/22/2014  Falls in the past year? Yes No No Yes No  Number falls in past yr: 1 - - 1 -  Injury with Fall? Yes - - - -  Risk for fall due to : - - - History of fall(s) -   Functional Status Survey:    Vitals:   01/15/19 1429  BP: 137/89  Pulse: 86  Resp: 18  Temp: (!) 97.3 F (36.3 C)  SpO2: 98%  Weight: 109 lb 14.4 oz (49.9 kg)  Height: 5\' 1"  (1.549 m)   Body mass index is 20.77 kg/m. Physical Exam Constitutional:      General: She is not in acute distress.    Appearance: Normal appearance. She is normal weight. She is not ill-appearing, toxic-appearing or diaphoretic.  HENT:     Head: Normocephalic and atraumatic.     Nose: Nose normal.     Mouth/Throat:     Mouth: Mucous membranes are moist.  Eyes:     Extraocular Movements: Extraocular movements intact.  Conjunctiva/sclera: Conjunctivae normal.     Pupils: Pupils are equal, round, and reactive to light.  Neck:     Musculoskeletal: Normal range of motion and neck supple.  Cardiovascular:     Rate and Rhythm: Normal rate and regular rhythm.     Heart sounds: No murmur.  Pulmonary:     Breath sounds: No wheezing, rhonchi or rales.  Abdominal:     Palpations: Abdomen is soft.     Tenderness: There is no abdominal tenderness. There is no right CVA tenderness, left CVA tenderness, guarding or rebound.  Musculoskeletal:     Right lower leg: No edema.     Left lower leg: No edema.     Comments: Ambulates with walker.   Skin:    General: Skin is warm and dry.  Neurological:      General: No focal deficit present.     Mental Status: She is alert. Mental status is at baseline.     Cranial Nerves: No cranial nerve deficit.     Motor: No weakness.     Coordination: Coordination normal.     Gait: Gait abnormal.     Comments: Oriented to self.   Psychiatric:        Mood and Affect: Mood normal.        Behavior: Behavior normal.     Labs reviewed: Recent Labs    05/21/18 09/01/18  NA 139 139  K 4.6 4.2  CL 105  --   CO2 23  --   BUN 28* 28*  CREATININE 0.7 0.7  CALCIUM 9.1  --    Recent Labs    05/21/18  AST 14  ALT 11  ALKPHOS 51  PROT 6.4  ALBUMIN 3.5   Recent Labs    05/21/18 09/01/18 09/07/18  WBC 11.8 14.6 9.0  HGB 12.9 13.4 12.4  HCT 38 39 36  PLT 223 201 240   Lab Results  Component Value Date   TSH 3.45 05/21/2018   Lab Results  Component Value Date   HGBA1C 6.7 07/18/2016   Lab Results  Component Value Date   CHOL 146 01/23/2017   HDL 39 01/23/2017   LDLCALC 76 01/23/2017   TRIG 214 (A) 01/23/2017    Significant Diagnostic Results in last 30 days:  No results found.  Assessment/Plan Adult failure to thrive Supportive care, continue Hospice service.   Vascular dementia with behavior disturbance ([place]) Continue SNF [facility] for safety, care assistance, ambulates with walker. Palliative care. Prn Lorazepam 0.5mg  q4hr available to her.   Osteoarthritis She appears no pain, continue prn Morphine 5mg  q4h prn. Observe.      Family/ staff Communication: plan of care reviewed with the patient, the patient's daughter-HPOA, and charge nurse.   Labs/tests ordered:  none  Time spend 25 minutes.

## 2019-01-19 ENCOUNTER — Encounter: Payer: Self-pay | Admitting: Nurse Practitioner

## 2019-01-19 ENCOUNTER — Non-Acute Institutional Stay (SKILLED_NURSING_FACILITY): Payer: Medicare Other | Admitting: Nurse Practitioner

## 2019-01-19 DIAGNOSIS — R627 Adult failure to thrive: Secondary | ICD-10-CM

## 2019-01-19 DIAGNOSIS — I872 Venous insufficiency (chronic) (peripheral): Secondary | ICD-10-CM

## 2019-01-19 DIAGNOSIS — S81811A Laceration without foreign body, right lower leg, initial encounter: Secondary | ICD-10-CM | POA: Insufficient documentation

## 2019-01-19 NOTE — Progress Notes (Signed)
Location:   [place] Room Number: 2 Place of Service:  SNF ([phone]) Provider: Anitria Andon.[place] NP  Virgie Dad, MD  Patient Care Team: Virgie Dad, MD as PCP - General (Internal Medicine) Inette Doubrava X, NP as Nurse Practitioner (Nurse Practitioner) Charlotte Crumb, MD as Consulting Physician (Orthopedic Surgery) Regal, Tamala Fothergill, DPM as Consulting Physician (Podiatry) Gaynelle Arabian, MD as Consulting Physician (Orthopedic Surgery) Zebedee Iba., MD as Referring Physician (Ophthalmology) Newman Pies, MD as Consulting Physician (Neurosurgery) Melina Modena, [facility] (Skilled Nursing and [place]) Callen Zuba X, NP as Nurse Practitioner (Internal Medicine) Ngetich, Nelda Bucks, NP as Nurse Practitioner (Family Medicine)  Extended Emergency Contact Information Primary Emergency Contact: Hitt,[name] Address: [place], [place] [address] Johnnette Litter of [place] Phone: [phone] Mobile Phone: [phone] Relation: Daughter Secondary Emergency Contact: [place] Phone: [phone] Relation: Grandson  Code Status:  DNR Goals of care: Advanced Directive information Advanced Directives 01/19/2019  Does Patient Have a Medical Advance Directive? Yes  Type of Advance Directive Out of facility DNR (pink MOST or yellow form)  Does patient want to make changes to medical advance directive? No - Patient declined  Copy of [place] in Chart? -  Pre-existing out of facility DNR order (yellow form or pink MOST form) Pink MOST form placed in chart (order not valid for inpatient use)     Chief Complaint  Patient presents with  . Acute Visit    Right Lower Extremity    HPI:  Pt is a 85 y.o. female seen today for an acute visit for right lower leg skin tear, steri strips closure is intact, no s/s of bleeding or infection. Hx of venous insufficiency, chronic brownish  skin changes noted, no apparent edema. Her goal of care is comfort measures, under Hospice service, prn Lorazepam, Morphine are available to her.    Past Medical History:  Diagnosis Date  . Arthritis   . Cataracts, bilateral   . Closed fracture of unspecified part of lower end of humerus   . Dementia ([place])   . Hypertension   . Insomnia, unspecified   . Macular degeneration (senile) of retina, unspecified   . Osteoarthritis   . Osteoarthrosis, unspecified whether generalized or localized, unspecified site   . Pancreatitis, acute 08/14/2013  . Retinal neovascularization NOS   . Senile osteoporosis   . Subarachnoid hemorrhage following injury ([place]) 03/31/2012  . Unspecified hearing loss   . Unspecified hypothyroidism   . Urinary tract infection 08/10/2013  . Vertebral fracture 08/09/2013   T7 10%, T12 70%    Past Surgical History:  Procedure Laterality Date  . ABDOMINAL HYSTERECTOMY  1942  . ANKLE FRACTURE SURGERY  1960  . BLADDER SUSPENSION    . CLOSED REDUCTION WITH HUMERAL PIN INSERTION  04/01/2012   Procedure: CLOSED REDUCTION WITH HUMERAL PIN INSERTION;  Surgeon: Schuyler Amor, MD;  [facility];  Service: Orthopedics;  Laterality: Left;    No Known Allergies  Allergies as of 01/19/2019   No Known Allergies     Medication List       Accurate as of January 19, 2019 11:59 PM. If you have any questions, ask your nurse or doctor.        ketoconazole 2 % shampoo Commonly known as: NIZORAL Apply 1 application topically 2 (two) times a week. Send shampoo with Daughter to beauty shop  with resident q week on Wednesday   LORazepam 0.5 MG tablet Commonly known as: ATIVAN Take 0.5 mg by mouth every 4 (four) hours as needed for anxiety.   morphine 20 MG/ML concentrated solution Commonly known as: ROXANOL Give 0.50ml by mouth every four hours as needed for pain/dyspnea      ROS was provided with assistance of staff.  Review of Systems  Constitutional: Negative  for activity change, appetite change, chills, diaphoresis, fatigue and fever.  HENT: Positive for hearing loss. Negative for congestion and voice change.   Respiratory: Negative for cough, shortness of breath and wheezing.   Gastrointestinal: Negative for abdominal distention, abdominal pain, constipation, diarrhea, nausea and vomiting.  Genitourinary: Negative for difficulty urinating, dysuria and urgency.  Musculoskeletal: Positive for arthralgias and gait problem.  Skin: Positive for wound.       RLE  Neurological: Negative for dizziness, speech difficulty, weakness and headaches.       Dementia  Psychiatric/Behavioral: Negative for agitation, behavioral problems, hallucinations and sleep disturbance. The patient is not nervous/anxious.     Immunization History  Administered Date(s) Administered  . Influenza-Unspecified 03/02/2013, 02/15/2014, 02/16/2015, 02/22/2016, 02/20/2017, 02/23/2018  . PPD Test 03/23/2012  . Pneumococcal Conjugate-13 12/17/2016  . Pneumococcal Polysaccharide-23 09/22/2002  . Td 11/17/2002  . Tdap 12/16/2016   Pertinent  Health Maintenance Due  Topic Date Due  . INFLUENZA VACCINE  02/18/2019 (Originally 12/12/2018)  . DEXA SCAN  Completed  . PNA vac Low Risk Adult  Completed   Fall Risk  12/24/2017 12/13/2016 02/07/2015 02/22/2014 02/22/2014  Falls in the past year? Yes No No Yes No  Number falls in past yr: 1 - - 1 -  Injury with Fall? Yes - - - -  Risk for fall due to : - - - History of fall(s) -   Functional Status Survey:    Vitals:   01/19/19 1708  BP: 137/89  Pulse: 86  Resp: 18  Temp: (!) 97.3 F (36.3 C)  SpO2: 95%  Weight: 109 lb 14.4 oz (49.9 kg)  Height: 5\' 1"  (1.549 m)   Body mass index is 20.77 kg/m. Physical Exam Vitals signs and nursing note reviewed.  Constitutional:      General: She is not in acute distress.    Appearance: Normal appearance. She is not ill-appearing or toxic-appearing.  HENT:     Head: Normocephalic and  atraumatic.     Nose: Nose normal.     Mouth/Throat:     Mouth: Mucous membranes are moist.  Eyes:     Extraocular Movements: Extraocular movements intact.     Conjunctiva/sclera: Conjunctivae normal.     Pupils: Pupils are equal, round, and reactive to light.  Neck:     Musculoskeletal: Normal range of motion and neck supple.  Cardiovascular:     Rate and Rhythm: Normal rate and regular rhythm.     Heart sounds: No murmur.  Pulmonary:     Breath sounds: No wheezing or rhonchi.  Abdominal:     General: Bowel sounds are normal. There is no distension.     Palpations: Abdomen is soft.     Tenderness: There is no abdominal tenderness. There is no right CVA tenderness, left CVA tenderness, guarding or rebound.  Musculoskeletal:     Right lower leg: No edema.  Skin:    General: Skin is warm and dry.     Comments: RLE skin tear is approximate with steri strips, no s/s of infection.   Neurological:  General: No focal deficit present.     Mental Status: She is alert. Mental status is at baseline.     Cranial Nerves: No cranial nerve deficit.     Motor: No weakness.     Coordination: Coordination normal.     Gait: Gait abnormal.     Comments: Oriented to self  Psychiatric:        Mood and Affect: Mood normal.     Labs reviewed: Recent Labs    05/21/18 09/01/18  NA 139 139  K 4.6 4.2  CL 105  --   CO2 23  --   BUN 28* 28*  CREATININE 0.7 0.7  CALCIUM 9.1  --    Recent Labs    05/21/18  AST 14  ALT 11  ALKPHOS 51  PROT 6.4  ALBUMIN 3.5   Recent Labs    05/21/18 09/01/18 09/07/18  WBC 11.8 14.6 9.0  HGB 12.9 13.4 12.4  HCT 38 39 36  PLT 223 201 240   Lab Results  Component Value Date   TSH 3.45 05/21/2018   Lab Results  Component Value Date   HGBA1C 6.7 07/18/2016   Lab Results  Component Value Date   CHOL 146 01/23/2017   HDL 39 01/23/2017   LDLCALC 76 01/23/2017   TRIG 214 (A) 01/23/2017    Significant Diagnostic Results in last 30 days:  No  results found.  Assessment/Plan Skin tear of lower leg without complication, right, initial encounter 01/19/19 skin tear, no s/s of infection, steri strips closure. Observe    Venous insufficiency Stable, no stasis ulcers, no apparent swelling. Chronic brownish venous skin changes noted.   Adult failure to thrive Continue supportive care, Hospice service, prn Lorazepam, Morphine available to her.      Family/ staff Communication: plan of care reviewed with the patient and charge nurse.   Labs/tests ordered:  none  Time spend 25 minutes.

## 2019-02-01 ENCOUNTER — Encounter: Payer: Self-pay | Admitting: Internal Medicine

## 2019-02-01 NOTE — Progress Notes (Signed)
Location:  [place] Room Number: 02 Place of Service:  SNF [phone]) Provider:  Veleta Miners MD  Virgie Dad, MD  Patient Care Team: Virgie Dad, MD as PCP - General (Internal Medicine) Mast, Man X, NP as Nurse Practitioner (Nurse Practitioner) Charlotte Crumb, MD as Consulting Physician (Orthopedic Surgery) Regal, Tamala Fothergill, DPM as Consulting Physician (Podiatry) Gaynelle Arabian, MD as Consulting Physician (Orthopedic Surgery) Zebedee Iba., MD as Referring Physician (Ophthalmology) Newman Pies, MD as Consulting Physician (Neurosurgery) Melina Modena, [facility] (Skilled Nursing and [place]) Mast, Man X, NP as Nurse Practitioner (Internal Medicine) Ngetich, Nelda Bucks, NP as Nurse Practitioner (Family Medicine)  Extended Emergency Contact Information Primary Emergency Contact: Crate,[name] Address: [place], [place] [address] Johnnette Litter of [place] Phone: [phone] Mobile Phone: [phone] Relation: Daughter Secondary Emergency Contact: [place] of [place] Phone: [phone] Relation: Grandson  Code Status:  DNR Goals of care: Advanced Directive information Advanced Directives 01/19/2019  Does Patient Have a Medical Advance Directive? Yes  Type of Advance Directive Out of facility DNR (pink MOST or yellow form)  Does patient want to make changes to medical advance directive? No - Patient declined  Copy of [place] in Chart? -  Pre-existing out of facility DNR order (yellow form or pink MOST form) Pink MOST form placed in chart (order not valid for inpatient use)     Chief Complaint  Patient presents with  . Acute Visit    C/o - anxiety    HPI:  Pt is a 85 y.o. female seen today for an acute visit for    Past Medical History:  Diagnosis Date  . Arthritis   . Cataracts, bilateral   . Closed fracture of unspecified part of lower end of  humerus   . Dementia ([place])   . Hypertension   . Insomnia, unspecified   . Macular degeneration (senile) of retina, unspecified   . Osteoarthritis   . Osteoarthrosis, unspecified whether generalized or localized, unspecified site   . Pancreatitis, acute 08/14/2013  . Retinal neovascularization NOS   . Senile osteoporosis   . Subarachnoid hemorrhage following injury ([place]) 03/31/2012  . Unspecified hearing loss   . Unspecified hypothyroidism   . Urinary tract infection 08/10/2013  . Vertebral fracture 08/09/2013   T7 10%, T12 70%    Past Surgical History:  Procedure Laterality Date  . ABDOMINAL HYSTERECTOMY  1942  . ANKLE FRACTURE SURGERY  1960  . BLADDER SUSPENSION    . CLOSED REDUCTION WITH HUMERAL PIN INSERTION  04/01/2012   Procedure: CLOSED REDUCTION WITH HUMERAL PIN INSERTION;  Surgeon: Schuyler Amor, MD;  [facility];  Service: Orthopedics;  Laterality: Left;    No Known Allergies  Outpatient Encounter Medications as of 02/01/2019  Medication Sig  . ketoconazole (NIZORAL) 2 % shampoo Apply 1 application topically 2 (two) times a week. Send shampoo with Daughter to beauty shop with resident q week on Wednesday  . LORazepam (ATIVAN) 0.5 MG tablet Take 0.5 mg by mouth every 4 (four) hours as needed for anxiety.  Marland Kitchen morphine (ROXANOL) 20 MG/ML concentrated solution Give 0.25ml by mouth every four hours as needed for pain/dyspnea   No facility-administered encounter medications on file as of 02/01/2019.     Review of Systems  Immunization History  Administered Date(s) Administered  . Influenza-Unspecified 03/02/2013, 02/15/2014, 02/16/2015, 02/22/2016, 02/20/2017, 02/23/2018  . PPD  Test 03/23/2012  . Pneumococcal Conjugate-13 12/17/2016  . Pneumococcal Polysaccharide-23 09/22/2002  . Td 11/17/2002  . Tdap 12/16/2016   Pertinent  Health Maintenance Due  Topic Date Due  . INFLUENZA VACCINE  02/18/2019 (Originally 12/12/2018)  . DEXA SCAN  Completed  . PNA vac Low  Risk Adult  Completed   Fall Risk  12/24/2017 12/13/2016 02/07/2015 02/22/2014 02/22/2014  Falls in the past year? Yes No No Yes No  Number falls in past yr: 1 - - 1 -  Injury with Fall? Yes - - - -  Risk for fall due to : - - - History of fall(s) -   Functional Status Survey:    Vitals:   02/01/19 1025  BP: 137/89  Pulse: 60  Resp: (!) 23  Temp: (!) 96.4 F (35.8 C)  SpO2: 95%  Weight: 109 lb 14.4 oz (49.9 kg)  Height: 5\' 1"  (1.549 m)   Body mass index is 20.77 kg/m. Physical Exam  Labs reviewed: Recent Labs    05/21/18 09/01/18  NA 139 139  K 4.6 4.2  CL 105  --   CO2 23  --   BUN 28* 28*  CREATININE 0.7 0.7  CALCIUM 9.1  --    Recent Labs    05/21/18  AST 14  ALT 11  ALKPHOS 51  PROT 6.4  ALBUMIN 3.5   Recent Labs    05/21/18 09/01/18 09/07/18  WBC 11.8 14.6 9.0  HGB 12.9 13.4 12.4  HCT 38 39 36  PLT 223 201 240   Lab Results  Component Value Date   TSH 3.45 05/21/2018   Lab Results  Component Value Date   HGBA1C 6.7 07/18/2016   Lab Results  Component Value Date   CHOL 146 01/23/2017   HDL 39 01/23/2017   LDLCALC 76 01/23/2017   TRIG 214 (A) 01/23/2017    Significant Diagnostic Results in last 30 days:  No results found.  Assessment/Plan There are no diagnoses linked to this encounter.   Family/ staff Communication:   Labs/tests ordered:

## 2019-02-02 ENCOUNTER — Encounter: Payer: Self-pay | Admitting: Nurse Practitioner

## 2019-02-02 DIAGNOSIS — I872 Venous insufficiency (chronic) (peripheral): Secondary | ICD-10-CM | POA: Insufficient documentation

## 2019-02-02 NOTE — Assessment & Plan Note (Signed)
Stable, no stasis ulcers, no apparent swelling. Chronic brownish venous skin changes noted.

## 2019-02-02 NOTE — Assessment & Plan Note (Signed)
Continue supportive care, Hospice service, prn Lorazepam, Morphine available to her.

## 2019-02-02 NOTE — Assessment & Plan Note (Signed)
01/19/19 skin tear, no s/s of infection, steri strips closure. Observe

## 2019-02-03 ENCOUNTER — Non-Acute Institutional Stay (SKILLED_NURSING_FACILITY): Payer: Medicare Other | Admitting: Internal Medicine

## 2019-02-03 ENCOUNTER — Encounter: Payer: Self-pay | Admitting: Internal Medicine

## 2019-02-03 DIAGNOSIS — I10 Essential (primary) hypertension: Secondary | ICD-10-CM

## 2019-02-03 DIAGNOSIS — F0151 Vascular dementia with behavioral disturbance: Secondary | ICD-10-CM | POA: Diagnosis not present

## 2019-02-03 DIAGNOSIS — R627 Adult failure to thrive: Secondary | ICD-10-CM

## 2019-02-03 DIAGNOSIS — F01518 Vascular dementia, unspecified severity, with other behavioral disturbance: Secondary | ICD-10-CM

## 2019-02-03 DIAGNOSIS — R634 Abnormal weight loss: Secondary | ICD-10-CM

## 2019-02-03 NOTE — Progress Notes (Signed)
Location:  [facility] Room Number: 2 Place of Service:  SNF [phone]) Provider:  Veleta Miners  MD  Virgie Dad, MD  Patient Care Team: Virgie Dad, MD as PCP - General (Internal Medicine) Mast, Man X, NP as Nurse Practitioner (Nurse Practitioner) Charlotte Crumb, MD as Consulting Physician (Orthopedic Surgery) Regal, Tamala Fothergill, DPM as Consulting Physician (Podiatry) Gaynelle Arabian, MD as Consulting Physician (Orthopedic Surgery) Zebedee Iba., MD as Referring Physician (Ophthalmology) Newman Pies, MD as Consulting Physician (Neurosurgery) Melina Modena, [facility] (Skilled Nursing and [place]) Mast, Man X, NP as Nurse Practitioner (Internal Medicine) Ngetich, Nelda Bucks, NP as Nurse Practitioner (Family Medicine)  Extended Emergency Contact Information Primary Emergency Contact: Poulton,[name] Address: [place], [place] [address] Johnnette Litter of [place] Phone: [phone] Mobile Phone: [phone] Relation: Daughter Secondary Emergency Contact: [place] of [place] Phone: [phone] Relation: Grandson  Code Status:  DNR Goals of care: Advanced Directive information Advanced Directives 01/19/2019  Does Patient Have a Medical Advance Directive? Yes  Type of Advance Directive Out of facility DNR (pink MOST or yellow form)  Does patient want to make changes to medical advance directive? No - Patient declined  Copy of [place] in Chart? -  Pre-existing out of facility DNR order (yellow form or pink MOST form) Pink MOST form placed in chart (order not valid for inpatient use)     Chief Complaint  Patient presents with  . Acute Visit    C/o - anxiety    HPI:  Pt is a 85 y.o. female seen today for an acute visit for follow up of PRN Ativan need  Patient has h/o Hypertension, Hypothyroidism, Dementia with Behavior issues and Osteoporosis And Recurrent  Falls And recently Weight Loss with Failure to thrive  Patient is failure to thrive. Has lost 3 more Pounds in Past 4 weeks She is now under Hospice care Pharmacy wanted [place] to see if she still needs Ativan. Per nurses she has not been using it since 8/24 Patient unable to give any history. She was sleeping. Also has Dementia Per Nurses she sleeps most of the time She doesn't let them take vitals on her.and sometimes resists care       Past Medical History:  Diagnosis Date  . Arthritis   . Cataracts, bilateral   . Closed fracture of unspecified part of lower end of humerus   . Dementia ([place])   . Hypertension   . Insomnia, unspecified   . Macular degeneration (senile) of retina, unspecified   . Osteoarthritis   . Osteoarthrosis, unspecified whether generalized or localized, unspecified site   . Pancreatitis, acute 08/14/2013  . Retinal neovascularization NOS   . Senile osteoporosis   . Subarachnoid hemorrhage following injury ([place]) 03/31/2012  . Unspecified hearing loss   . Unspecified hypothyroidism   . Urinary tract infection 08/10/2013  . Vertebral fracture 08/09/2013   T7 10%, T12 70%    Past Surgical History:  Procedure Laterality Date  . ABDOMINAL HYSTERECTOMY  1942  . ANKLE FRACTURE SURGERY  1960  . BLADDER SUSPENSION    . CLOSED REDUCTION WITH HUMERAL PIN INSERTION  04/01/2012   Procedure: CLOSED REDUCTION WITH HUMERAL PIN INSERTION;  Surgeon: Schuyler Amor, MD;  Location: [facility];  Service: Orthopedics;  Laterality: Left;    No Known Allergies  Outpatient Encounter Medications as of 02/03/2019  Medication Sig  .  ketoconazole (NIZORAL) 2 % shampoo Apply 1 application topically. Send shampoo with Daughter to beauty shop with resident q week on Wednesday   . LORazepam (ATIVAN) 0.5 MG tablet Take 0.5 mg by mouth every 4 (four) hours as needed for anxiety.  Marland Kitchen morphine (ROXANOL) 20 MG/ML concentrated solution Give 0.81ml by mouth every four hours as needed for  pain/dyspnea  . Nutritional Supplements (BOOST GLUCOSE CONTROL) LIQD Take 237 mLs by mouth daily. Give in afternoon (4:00pm -7:00pm).   No facility-administered encounter medications on file as of 02/03/2019.     Review of Systems  Unable to perform ROS: Dementia    Immunization History  Administered Date(s) Administered  . Influenza-Unspecified 03/02/2013, 02/15/2014, 02/16/2015, 02/22/2016, 02/20/2017, 02/23/2018  . PPD Test 03/23/2012  . Pneumococcal Conjugate-13 12/17/2016  . Pneumococcal Polysaccharide-23 09/22/2002  . Td 11/17/2002  . Tdap 12/16/2016   Pertinent  Health Maintenance Due  Topic Date Due  . INFLUENZA VACCINE  02/18/2019 (Originally 12/12/2018)  . DEXA SCAN  Completed  . PNA vac Low Risk Adult  Completed   Fall Risk  12/24/2017 12/13/2016 02/07/2015 02/22/2014 02/22/2014  Falls in the past year? Yes No No Yes No  Number falls in past yr: 1 - - 1 -  Injury with Fall? Yes - - - -  Risk for fall due to : - - - History of fall(s) -   Functional Status Survey:    Vitals:   02/03/19 1026  BP: 137/89  Pulse: 60  Resp: 18  Temp: (!) 97.2 F (36.2 C)  SpO2: 95%  Weight: 109 lb 14.4 oz (49.9 kg)  Height: 5\' 1"  (1.549 m)   Body mass index is 20.77 kg/m. Physical Exam Vitals signs reviewed.  Constitutional:      Appearance: Normal appearance.  HENT:     Head: Normocephalic.     Nose: Nose normal.     Mouth/Throat:     Mouth: Mucous membranes are moist.  Eyes:     Pupils: Pupils are equal, round, and reactive to light.  Neck:     Musculoskeletal: Neck supple.  Cardiovascular:     Rate and Rhythm: Normal rate and regular rhythm.     Pulses: Normal pulses.  Pulmonary:     Effort: Pulmonary effort is normal.     Breath sounds: Normal breath sounds.  Abdominal:     General: Abdomen is flat. Bowel sounds are normal.     Palpations: Abdomen is soft.  Musculoskeletal:        General: No swelling.  Neurological:     Mental Status: She is alert.      Labs reviewed: Recent Labs    05/21/18 09/01/18  NA 139 139  K 4.6 4.2  CL 105  --   CO2 23  --   BUN 28* 28*  CREATININE 0.7 0.7  CALCIUM 9.1  --    Recent Labs    05/21/18  AST 14  ALT 11  ALKPHOS 51  PROT 6.4  ALBUMIN 3.5   Recent Labs    05/21/18 09/01/18 09/07/18  WBC 11.8 14.6 9.0  HGB 12.9 13.4 12.4  HCT 38 39 36  PLT 223 201 240   Lab Results  Component Value Date   TSH 3.45 05/21/2018   Lab Results  Component Value Date   HGBA1C 6.7 07/18/2016   Lab Results  Component Value Date   CHOL 146 01/23/2017   HDL 39 01/23/2017   LDLCALC 76 01/23/2017   TRIG 214 (A) 01/23/2017  Significant Diagnostic Results in last 30 days:  No results found.  Assessment/Plan Failure to thrive with h/o Dementia , Hypertension and Hypothyroidism Patient has refused most of her Meds She is off everything except Morphine Will discontinue ativan due to non Use. Talked to Nurse will restart it if she gets Anxious Family wants her to be comfort care   Family/ staff Communication:   Labs/tests ordered:   Total time spent in this patient care encounter was  25_  minutes; greater than 50% of the visit spent counseling  staff, reviewing records , Labs and coordinating care for problems addressed at this encounter.

## 2019-02-03 NOTE — Progress Notes (Signed)
This encounter was created in error - please disregard.

## 2019-02-15 ENCOUNTER — Encounter: Payer: Self-pay | Admitting: Internal Medicine

## 2019-02-15 ENCOUNTER — Non-Acute Institutional Stay (SKILLED_NURSING_FACILITY): Payer: Medicare Other | Admitting: Internal Medicine

## 2019-02-15 DIAGNOSIS — F0151 Vascular dementia with behavioral disturbance: Secondary | ICD-10-CM | POA: Diagnosis not present

## 2019-02-15 DIAGNOSIS — I10 Essential (primary) hypertension: Secondary | ICD-10-CM

## 2019-02-15 DIAGNOSIS — R627 Adult failure to thrive: Secondary | ICD-10-CM

## 2019-02-15 DIAGNOSIS — F01518 Vascular dementia, unspecified severity, with other behavioral disturbance: Secondary | ICD-10-CM

## 2019-02-15 DIAGNOSIS — R634 Abnormal weight loss: Secondary | ICD-10-CM

## 2019-02-15 DIAGNOSIS — Z8673 Personal history of transient ischemic attack (TIA), and cerebral infarction without residual deficits: Secondary | ICD-10-CM

## 2019-02-15 NOTE — Progress Notes (Signed)
[facility] Room Number: 2 Place of Service:  SNF (31) Provider:  Veleta Miners MD  Melissa Dad, MD  Patient Care Team: Melissa Dad, MD as PCP - General (Internal Medicine) Melissa Carroll, Melissa X, NP as Nurse Practitioner (Nurse Practitioner) Melissa Crumb, MD as Consulting Physician (Orthopedic Surgery) Melissa Carroll, Melissa Carroll, DPM as Consulting Physician (Podiatry) Gaynelle Arabian, MD as Consulting Physician (Orthopedic Surgery) Zebedee Iba., MD as Referring Physician (Ophthalmology) Newman Pies, MD as Consulting Physician (Neurosurgery) Melina Modena, [facility] (Skilled Nursing and [place]) Melissa Carroll, Melissa X, NP as Nurse Practitioner (Internal Medicine) Melissa Carroll, Melissa Bucks, NP as Nurse Practitioner (Family Medicine)  Extended Emergency Contact Information Primary Emergency Contact: Melissa Carroll,Melissa Carroll Address: [place], [place] [address] Melissa Carroll of [place] Phone: [phone] Mobile Phone: [phone] Relation: Daughter Secondary Emergency Contact: Melissa Carroll of [place] Phone: [phone] Relation: Grandson  Code Status:  DNR Goals of care: Advanced Directive information Advanced Directives 02/15/2019  Does Patient Have a Medical Advance Directive? Yes  Type of Advance Directive Out of facility DNR (pink MOST or yellow form)  Does patient want to make changes to medical advance directive? No - Patient declined  Copy of [place] in Chart? -  Pre-existing out of facility DNR order (yellow form or pink MOST form) Pink MOST form placed in chart (order not valid for inpatient use)     Chief Complaint  Patient presents with  . Medical Management of Chronic Issues    HPI:  Pt is a 85 y.o. female seen today for medical management of chronic diseases.    Patient has h/o Hypertension, Hypothyroidism, Dementia with Behavior issues and Osteoporosis And Recurrent Falls And recently  Weight Loss with Failure to thrive  Patient is long term resident of facility. Under Hospice care. Is loosing weight and does not let Nurses do her Vitals. Refuses me to do detail exam. Daughter in the room.  Per nurses she is losing more weight sleeps most of the time not eating.  Due to weakness patient has had couple of falls as she forgets and tries to get up. Patient was sleeping when I see her.  Unable to give any history due to her dementia Daughter does not have any complaints Patient is off all her medications as per family request  Past Medical History:  Diagnosis Date  . Arthritis   . Cataracts, bilateral   . Closed fracture of unspecified part of lower end of humerus   . Dementia ([place])   . Hypertension   . Insomnia, unspecified   . Macular degeneration (senile) of retina, unspecified   . Osteoarthritis   . Osteoarthrosis, unspecified whether generalized or localized, unspecified site   . Pancreatitis, acute 08/14/2013  . Retinal neovascularization NOS   . Senile osteoporosis   . Subarachnoid hemorrhage following injury ([place]) 03/31/2012  . Unspecified hearing loss   . Unspecified hypothyroidism   . Urinary tract infection 08/10/2013  . Vertebral fracture 08/09/2013   T7 10%, T12 70%    Past Surgical History:  Procedure Laterality Date  . ABDOMINAL HYSTERECTOMY  1942  . ANKLE FRACTURE SURGERY  1960  . BLADDER SUSPENSION    . CLOSED REDUCTION WITH HUMERAL PIN INSERTION  04/01/2012   Procedure: CLOSED REDUCTION WITH HUMERAL PIN INSERTION;  Surgeon: Schuyler Amor, MD;  [facility]: [facility];  Service: Orthopedics;  Laterality: Left;  No Known Allergies  Allergies as of 02/15/2019   No Known Allergies     Medication List       Accurate as of February 15, 2019  3:03 PM. If you have any questions, ask your nurse or doctor.        STOP taking these medications   LORazepam 0.5 MG tablet Commonly known as: ATIVAN Stopped by: Melissa Dad, MD     TAKE these  medications   Boost Glucose Control Liqd Take 237 mLs by mouth daily. Give in afternoon (4:00pm -7:00pm).   ketoconazole 2 % shampoo Commonly known as: NIZORAL Apply 1 application topically. Send shampoo with Daughter to beauty shop with resident q week on Wednesday   morphine 20 MG/ML concentrated solution Commonly known as: ROXANOL Give 0.61ml by mouth every four hours as needed for pain/dyspnea   polyethylene glycol 17 g packet Commonly known as: MIRALAX / GLYCOLAX Take 17 g by mouth daily as needed.       Review of Systems  Unable to perform ROS: Dementia    Immunization History  Administered Date(s) Administered  . Influenza-Unspecified 03/02/2013, 02/15/2014, 02/16/2015, 02/22/2016, 02/20/2017, 02/23/2018  . PPD Test 03/23/2012  . Pneumococcal Conjugate-13 12/17/2016  . Pneumococcal Polysaccharide-23 09/22/2002  . Td 11/17/2002  . Tdap 12/16/2016   Pertinent  Health Maintenance Due  Topic Date Due  . INFLUENZA VACCINE  02/18/2019 (Originally 12/12/2018)  . DEXA SCAN  Completed  . PNA vac Low Risk Adult  Completed   Fall Risk  12/24/2017 12/13/2016 02/07/2015 02/22/2014 02/22/2014  Falls in the past year? Yes No No Yes No  Number falls in past yr: 1 - - 1 -  Injury with Fall? Yes - - - -  Risk for fall due to : - - - History of fall(s) -   Functional Status Survey:    Vitals:   02/15/19 1500  BP: 137/89  Pulse: 63  Resp: 20  Temp: (!) 97 F (36.1 C)  SpO2: 94%  Weight: 109 lb 14.4 oz (49.9 kg)  Height: 5\' 1"  (1.549 m)   Body mass index is 20.77 kg/m. Physical Exam Vitals signs reviewed.  HENT:     Head: Normocephalic.     Nose: Nose normal.     Mouth/Throat:     Mouth: Mucous membranes are moist.  Eyes:     Pupils: Pupils are equal, round, and reactive to light.  Neck:     Musculoskeletal: Neck supple.  Cardiovascular:     Rate and Rhythm: Normal rate. Rhythm irregular.     Heart sounds: Murmur present.  Pulmonary:     Effort: Pulmonary effort  is normal.     Breath sounds: Normal breath sounds.  Abdominal:     General: Abdomen is flat. Bowel sounds are normal.     Palpations: Abdomen is soft.  Musculoskeletal:        General: Swelling present.  Skin:    General: Skin is warm.     Findings: Bruising present.  Neurological:     Mental Status: She is alert.     Labs reviewed: Recent Labs    05/21/18 09/01/18  NA 139 139  K 4.6 4.2  CL 105  --   CO2 23  --   BUN 28* 28*  CREATININE 0.7 0.7  CALCIUM 9.1  --    Recent Labs    05/21/18  AST 14  ALT 11  ALKPHOS 51  PROT 6.4  ALBUMIN 3.5   Recent Labs  05/21/18 09/01/18 09/07/18  WBC 11.8 14.6 9.0  HGB 12.9 13.4 12.4  HCT 38 39 36  PLT 223 201 240   Lab Results  Component Value Date   TSH 3.45 05/21/2018   Lab Results  Component Value Date   HGBA1C 6.7 07/18/2016   Lab Results  Component Value Date   CHOL 146 01/23/2017   HDL 39 01/23/2017   LDLCALC 76 01/23/2017   TRIG 214 (A) 01/23/2017    Significant Diagnostic Results in last 30 days:  No results found.  Assessment/Plan Failure to thrive with history of dementia, hypertension and hypothyroidism She has refused all her meds and is off everything now per family request We will continue on Roxanol Continue comfort care Under hospice care  Family/ staff Communication:   Labs/tests ordered:    Total time spent in this patient care encounter was  25_  minutes; greater than 50% of the visit spent counseling staff, reviewing records , Labs and coordinating care for problems addressed at this encounter.

## 2019-03-09 ENCOUNTER — Encounter: Payer: Self-pay | Admitting: Nurse Practitioner

## 2019-03-09 ENCOUNTER — Non-Acute Institutional Stay (SKILLED_NURSING_FACILITY): Payer: Medicare Other | Admitting: Nurse Practitioner

## 2019-03-09 DIAGNOSIS — F0151 Vascular dementia with behavioral disturbance: Secondary | ICD-10-CM | POA: Diagnosis not present

## 2019-03-09 DIAGNOSIS — R627 Adult failure to thrive: Secondary | ICD-10-CM

## 2019-03-09 DIAGNOSIS — F01518 Vascular dementia, unspecified severity, with other behavioral disturbance: Secondary | ICD-10-CM

## 2019-03-09 DIAGNOSIS — K5900 Constipation, unspecified: Secondary | ICD-10-CM

## 2019-03-09 DIAGNOSIS — I10 Essential (primary) hypertension: Secondary | ICD-10-CM | POA: Diagnosis not present

## 2019-03-09 NOTE — Progress Notes (Signed)
Location: SN[facility] Room Number: 2 Place of Service:  SNF (31) Provider:  Jaymarie Yeakel NP  Virgie Dad, MD  Patient Care Team: Virgie Dad, MD as PCP - General (Internal Medicine) Jonasia Coiner X, NP as Nurse Practitioner (Nurse Practitioner) Charlotte Crumb, MD as Consulting Physician (Orthopedic Surgery) Regal, Tamala Fothergill, DPM as Consulting Physician (Podiatry) Gaynelle Arabian, MD as Consulting Physician (Orthopedic Surgery) Zebedee Iba., MD as Referring Physician (Ophthalmology) Newman Pies, MD as Consulting Physician (Neurosurgery) Melina Modena, [facility] (Skilled Nursing and [place]) Kyrillos [place] X, NP as Nurse Practitioner (Internal Medicine) Ngetich, Nelda Bucks, NP as Nurse Practitioner (Family Medicine)  Extended Emergency Contact Information Primary Emergency Contact: Yun,[name] Address: [place], [place] [address] Johnnette Litter of [place] Phone: [phone] Mobile Phone: [phone] Relation: Daughter Secondary Emergency Contact: [place] of [place] Phone: [phone] Relation: Grandson  Code Status:  DNR Goals of care: Advanced Directive information Advanced Directives 03/09/2019  Does Patient Have a Medical Advance Directive? Yes  Type of Advance Directive Out of facility DNR (pink MOST or yellow form)  Does patient want to make changes to medical advance directive? No - Patient declined  Copy of [place] in Chart? -  Pre-existing out of facility DNR order (yellow form or pink MOST form) Pink MOST form placed in chart (order not valid for inpatient use)     Chief Complaint  Patient presents with  . Medical Management of Chronic Issues  . Health Maintenance    Influenza vaccine    HPI:  Pt is a 85 y.o. female seen today for medical management of chronic diseases.    The patient resides in SNF [facility] for safety, care assistance, her goal of care  is comfort measures, under Hospice service. Hx of HTN, blood pressure is not controlled, she denied headache, chest pain, pressure, palpitation, nausea, vomiting, abd pain. HPI was provided with assistance of staff. Prn Morphine 5mg  q4hr available to her. No constipation, prn MiraLax available to her.    Past Medical History:  Diagnosis Date  . Arthritis   . Cataracts, bilateral   . Closed fracture of unspecified part of lower end of humerus   . Dementia ([place])   . Hypertension   . Insomnia, unspecified   . Macular degeneration (senile) of retina, unspecified   . Osteoarthritis   . Osteoarthrosis, unspecified whether generalized or localized, unspecified site   . Pancreatitis, acute 08/14/2013  . Retinal neovascularization NOS   . Senile osteoporosis   . Subarachnoid hemorrhage following injury ([place]) 03/31/2012  . Unspecified hearing loss   . Unspecified hypothyroidism   . Urinary tract infection 08/10/2013  . Vertebral fracture 08/09/2013   T7 10%, T12 70%    Past Surgical History:  Procedure Laterality Date  . ABDOMINAL HYSTERECTOMY  1942  . ANKLE FRACTURE SURGERY  1960  . BLADDER SUSPENSION    . CLOSED REDUCTION WITH HUMERAL PIN INSERTION  04/01/2012   Procedure: CLOSED REDUCTION WITH HUMERAL PIN INSERTION;  Surgeon: Schuyler Amor, MD;  [facility]: [facility];  Service: Orthopedics;  Laterality: Left;    No Known Allergies  Allergies as of 03/09/2019   No Known Allergies     Medication List       Accurate as of March 09, 2019  1:32 PM. If you have any questions, ask your nurse or doctor.  Boost Glucose Control Liqd Take 237 mLs by mouth daily. Give in afternoon (4:00pm -7:00pm).   ketoconazole 2 % shampoo Commonly known as: NIZORAL Apply 1 application topically. Send shampoo with Daughter to beauty shop with resident q week on Wednesday   morphine 20 MG/ML concentrated solution Commonly known as: ROXANOL Give 0.67ml by mouth every four hours as needed for  pain/dyspnea   polyethylene glycol 17 g packet Commonly known as: MIRALAX / GLYCOLAX Take 17 g by mouth daily as needed.       Review of Systems  Constitutional: Negative for activity change, appetite change, chills, diaphoresis, fatigue, fever and unexpected weight change.  HENT: Positive for hearing loss. Negative for congestion and voice change.   Respiratory: Negative for cough, shortness of breath and wheezing.   Cardiovascular: Negative for chest pain, palpitations and leg swelling.  Gastrointestinal: Negative for abdominal distention, constipation, diarrhea, nausea and vomiting.  Genitourinary: Negative for difficulty urinating, dysuria and urgency.  Musculoskeletal: Positive for arthralgias and gait problem.  Skin: Negative for color change and pallor.  Neurological: Negative for dizziness, speech difficulty and headaches.       Dementia  Psychiatric/Behavioral: Positive for confusion. Negative for agitation, behavioral problems, hallucinations and sleep disturbance. The patient is not nervous/anxious.     Immunization History  Administered Date(s) Administered  . Influenza-Unspecified 03/02/2013, 02/15/2014, 02/16/2015, 02/22/2016, 02/20/2017, 02/23/2018  . PPD Test 03/23/2012  . Pneumococcal Conjugate-13 12/17/2016  . Pneumococcal Polysaccharide-23 09/22/2002  . Td 11/17/2002  . Tdap 12/16/2016   Pertinent  Health Maintenance Due  Topic Date Due  . INFLUENZA VACCINE  12/12/2018  . DEXA SCAN  Completed  . PNA vac Low Risk Adult  Completed   Fall Risk  12/24/2017 12/13/2016 02/07/2015 02/22/2014 02/22/2014  Falls in the past year? Yes No No Yes No  Number falls in past yr: 1 - - 1 -  Injury with Fall? Yes - - - -  Risk for fall due to : - - - History of fall(s) -   Functional Status Survey:    Vitals:   03/09/19 0854  BP: (!) 194/118  Pulse: 78  Resp: 18  Temp: (!) 97.1 F (36.2 C)  SpO2: 93%  Weight: 109 lb 14.4 oz (49.9 kg)  Height: 5\' 1"  (1.549 m)    Body mass index is 20.77 kg/m. Physical Exam Vitals signs and nursing note reviewed.  Constitutional:      General: She is not in acute distress.    Appearance: Normal appearance. She is normal weight. She is not ill-appearing, toxic-appearing or diaphoretic.  HENT:     Head: Normocephalic and atraumatic.     Nose: Nose normal.     Mouth/Throat:     Mouth: Mucous membranes are moist.  Eyes:     Extraocular Movements: Extraocular movements intact.     Conjunctiva/sclera: Conjunctivae normal.     Pupils: Pupils are equal, round, and reactive to light.  Cardiovascular:     Rate and Rhythm: Normal rate. Rhythm irregular.     Heart sounds: Murmur present.  Pulmonary:     Breath sounds: No wheezing, rhonchi or rales.  Chest:     Chest wall: No tenderness.  Abdominal:     General: Bowel sounds are normal. There is no distension.     Palpations: Abdomen is soft.     Tenderness: There is no abdominal tenderness. There is no right CVA tenderness, left CVA tenderness, guarding or rebound.  Musculoskeletal:     Right lower leg:  No edema.     Left lower leg: No edema.  Skin:    General: Skin is warm and dry.  Neurological:     General: No focal deficit present.     Mental Status: She is alert. Mental status is at baseline.     Comments: Oriented to self.   Psychiatric:        Mood and Affect: Mood normal.        Behavior: Behavior normal.     Labs reviewed: Recent Labs    05/21/18 09/01/18  NA 139 139  K 4.6 4.2  CL 105  --   CO2 23  --   BUN 28* 28*  CREATININE 0.7 0.7  CALCIUM 9.1  --    Recent Labs    05/21/18  AST 14  ALT 11  ALKPHOS 51  PROT 6.4  ALBUMIN 3.5   Recent Labs    05/21/18 09/01/18 09/07/18  WBC 11.8 14.6 9.0  HGB 12.9 13.4 12.4  HCT 38 39 36  PLT 223 201 240   Lab Results  Component Value Date   TSH 3.45 05/21/2018   Lab Results  Component Value Date   HGBA1C 6.7 07/18/2016   Lab Results  Component Value Date   CHOL 146 01/23/2017    HDL 39 01/23/2017   LDLCALC 76 01/23/2017   TRIG 214 (A) 01/23/2017    Significant Diagnostic Results in last 30 days:  No results found.  Assessment/Plan Hypertension Uncontrolled blood pressure, asymptomatic presently, continue supportive care, comfort measures.   Constipation Stable, continue MiraLax daily prn.   Vascular dementia with behavior disturbance ([place]) Continue SNF [facility] for safety, care assistance, ambulates with walker.   Adult failure to thrive Persists, continue supportive care, Hospice service.      Family/ staff Communication: plan of care reviewed with the patient and charge nurse.   Labs/tests ordered:  None  Time spend 25 minutes.

## 2019-03-09 NOTE — Assessment & Plan Note (Signed)
Persists, continue supportive care, Hospice service.

## 2019-03-09 NOTE — Assessment & Plan Note (Signed)
Stable, continue MiraLax daily prn.

## 2019-03-09 NOTE — Assessment & Plan Note (Signed)
Continue SNF [facility] for safety, care assistance, ambulates with walker.

## 2019-03-09 NOTE — Assessment & Plan Note (Signed)
Uncontrolled blood pressure, asymptomatic presently, continue supportive care, comfort measures.

## 2019-04-05 ENCOUNTER — Non-Acute Institutional Stay (SKILLED_NURSING_FACILITY): Payer: Medicare Other | Admitting: Internal Medicine

## 2019-04-05 ENCOUNTER — Encounter: Payer: Self-pay | Admitting: Internal Medicine

## 2019-04-05 DIAGNOSIS — I10 Essential (primary) hypertension: Secondary | ICD-10-CM | POA: Diagnosis not present

## 2019-04-05 DIAGNOSIS — F01518 Vascular dementia, unspecified severity, with other behavioral disturbance: Secondary | ICD-10-CM

## 2019-04-05 DIAGNOSIS — I639 Cerebral infarction, unspecified: Secondary | ICD-10-CM

## 2019-04-05 DIAGNOSIS — F0151 Vascular dementia with behavioral disturbance: Secondary | ICD-10-CM

## 2019-04-05 NOTE — Progress Notes (Signed)
Location: [place] Room Number: 2 Place of Service:  SNF [phone])  Provider: Veleta Miners MD  Code Status: DNR Goals of Care:  Advanced Directives 03/09/2019  Does Patient Have a Medical Advance Directive? Yes  Type of Advance Directive Out of facility DNR (pink MOST or yellow form)  Does patient want to make changes to medical advance directive? No - Patient declined  Copy of [facility] in Chart? -  Pre-existing out of facility DNR order (yellow form or pink MOST form) Pink MOST form placed in chart (order not valid for inpatient use)     Chief Complaint  Patient presents with  . Acute Visit    Possible stroke and end of life    HPI: Patient is a 85 y.o. female seen today for an acute visit for Possible CVA and Anxiety  Patient has h/o Hypertension, Hypothyroidism, Dementia with Behavior issues and Osteoporosis And Recurrent Falls And recently Weight Loss with Failure to thrive  Patient is under Hospice care Last Night Nurses noticed that patient was not using her Left Hand which was Flaccid. Patient seems more Anxious also today with moving around and not comfortable Her daughter wanted to know if we can start her on Ativan again She is on Roxanol but that has not helped her She has not been eating well since last night  Her weight has been stable for past few weeks No Fever or Nausea vomiting She is off All her Meds  Past Medical History:  Diagnosis Date  . Arthritis   . Cataracts, bilateral   . Closed fracture of unspecified part of lower end of humerus   . Dementia ([place])   . Hypertension   . Insomnia, unspecified   . Macular degeneration (senile) of retina, unspecified   . Osteoarthritis   . Osteoarthrosis, unspecified whether generalized or localized, unspecified site   . Pancreatitis, acute 08/14/2013  . Retinal neovascularization NOS   . Senile osteoporosis   . Subarachnoid hemorrhage following injury ([place])  03/31/2012  . Unspecified hearing loss   . Unspecified hypothyroidism   . Urinary tract infection 08/10/2013  . Vertebral fracture 08/09/2013   T7 10%, T12 70%     Past Surgical History:  Procedure Laterality Date  . ABDOMINAL HYSTERECTOMY  1942  . ANKLE FRACTURE SURGERY  1960  . BLADDER SUSPENSION    . CLOSED REDUCTION WITH HUMERAL PIN INSERTION  04/01/2012   Procedure: CLOSED REDUCTION WITH HUMERAL PIN INSERTION;  Surgeon: Schuyler Amor, MD;  Location: [facility];  Service: Orthopedics;  Laterality: Left;    No Known Allergies  Outpatient Encounter Medications as of 04/05/2019  Medication Sig  . bisacodyl (DULCOLAX) 10 MG suppository Place 10 mg rectally as needed for moderate constipation.  Marland Kitchen ketoconazole (NIZORAL) 2 % shampoo Apply 1 application topically. Send shampoo with Daughter to beauty shop with resident q week on Wednesday   . LORazepam (ATIVAN) 0.5 MG tablet 0.5 mg. Ativan 0.5 mg PO Q2H PRN for anxiety. Every 2 Hours - PRN  . morphine (ROXANOL) 20 MG/ML concentrated solution Give 0.62ml by mouth every four hours as needed for pain/dyspnea  . Nutritional Supplements (BOOST GLUCOSE CONTROL) LIQD Take 237 mLs by mouth daily. Give in afternoon (4:00pm -7:00pm).  . [DISCONTINUED] polyethylene glycol (MIRALAX / GLYCOLAX) 17 g packet Take 17 g by mouth daily as needed.   No facility-administered encounter medications on file as of 04/05/2019.     Review of Systems:  Review of Systems  Unable to perform ROS: Dementia    Health Maintenance  Topic Date Due  . INFLUENZA VACCINE  12/12/2018  . TETANUS/TDAP  12/17/2026  . DEXA SCAN  Completed  . PNA vac Low Risk Adult  Completed    Physical Exam: Vitals:   04/05/19 1529  BP: (!) 194/118  Pulse: 78  Resp: 20  Temp: (!) 96.7 F (35.9 C)  SpO2: 93%  Weight: 109 lb 14.4 oz (49.9 kg)  Height: 5\' 1"  (1.549 m)   Body mass index is 20.77 kg/m. Physical Exam Vitals signs reviewed.  Constitutional:      Comments:  Does not respond to any Commands   HENT:     Head: Normocephalic.     Nose: Nose normal.     Mouth/Throat:     Mouth: Mucous membranes are dry.  Eyes:     Pupils: Pupils are equal, round, and reactive to light.  Neck:     Musculoskeletal: Neck supple.  Cardiovascular:     Rate and Rhythm: Regular rhythm. Tachycardia present.     Pulses: Normal pulses.  Pulmonary:     Effort: Pulmonary effort is normal.     Breath sounds: Normal breath sounds.  Abdominal:     General: Abdomen is flat. Bowel sounds are normal.     Palpations: Abdomen is soft.  Musculoskeletal:        General: No swelling.  Skin:    General: Skin is warm.  Neurological:     Mental Status: She is alert.     Comments: Not moving her Left Arm with Flaccid Hand  Psychiatric:     Comments: Restless      Labs reviewed: Basic Metabolic Panel: Recent Labs    05/21/18 09/01/18  NA 139 139  K 4.6 4.2  CL 105  --   CO2 23  --   BUN 28* 28*  CREATININE 0.7 0.7  CALCIUM 9.1  --   TSH 3.45  --    Liver Function Tests: Recent Labs    05/21/18  AST 14  ALT 11  ALKPHOS 51  PROT 6.4  ALBUMIN 3.5   No results for input(s): LIPASE, AMYLASE in the last 8760 hours. No results for input(s): AMMONIA in the last 8760 hours. CBC: Recent Labs    05/21/18 09/01/18 09/07/18  WBC 11.8 14.6 9.0  HGB 12.9 13.4 12.4  HCT 38 39 36  PLT 223 201 240   Lipid Panel: No results for input(s): CHOL, HDL, LDLCALC, TRIG, CHOLHDL, LDLDIRECT in the last 8760 hours. Lab Results  Component Value Date   HGBA1C 6.7 07/18/2016    Procedures since last visit: No results found.  Assessment/Plan End stage Dementia with Failure to thrive and Possible stroke Will start her on Ativan 0.5 mg Q2 hours PEN Already on Roxanol. Will change the dose to 5mg  Q 2 hours PRN Daughter does not want anything for her Stroke No Aspirin right now Keep her comfortable  Labs/tests ordered:  * No order type specified * Next appt:  Visit date not  found  Total time spent in this patient care encounter was  25_  minutes; greater than 50% of the visit spent counseling patient  daughter and staff, reviewing records , Labs and coordinating care for problems addressed at this encounter.

## 2019-04-13 ENCOUNTER — Non-Acute Institutional Stay (SKILLED_NURSING_FACILITY): Payer: Medicare Other | Admitting: Nurse Practitioner

## 2019-04-13 ENCOUNTER — Encounter: Payer: Self-pay | Admitting: Nurse Practitioner

## 2019-04-13 DIAGNOSIS — F419 Anxiety disorder, unspecified: Secondary | ICD-10-CM

## 2019-04-13 DIAGNOSIS — F01518 Vascular dementia, unspecified severity, with other behavioral disturbance: Secondary | ICD-10-CM

## 2019-04-13 DIAGNOSIS — K5900 Constipation, unspecified: Secondary | ICD-10-CM

## 2019-04-13 DIAGNOSIS — M8949 Other hypertrophic osteoarthropathy, multiple sites: Secondary | ICD-10-CM

## 2019-04-13 DIAGNOSIS — R627 Adult failure to thrive: Secondary | ICD-10-CM | POA: Diagnosis not present

## 2019-04-13 DIAGNOSIS — F0151 Vascular dementia with behavioral disturbance: Secondary | ICD-10-CM

## 2019-04-13 DIAGNOSIS — M159 Polyosteoarthritis, unspecified: Secondary | ICD-10-CM

## 2019-04-13 NOTE — Assessment & Plan Note (Signed)
Stable, continue prn Dulcolax suppository.

## 2019-04-13 NOTE — Assessment & Plan Note (Signed)
Related to advanced dementia, continue SNF [facility] for safety, continue prn Lorazepam for anxiety/comfort measures.

## 2019-04-13 NOTE — Assessment & Plan Note (Signed)
Continue prn Morphine for comfort care.

## 2019-04-13 NOTE — Assessment & Plan Note (Signed)
Supportive care. 

## 2019-04-13 NOTE — Progress Notes (Signed)
Location:   SNF [place] Room Number: 2 Place of Service:  SNF ([id number]) Provider: [facility] Tamera Pingley NP  Virgie Dad, MD  Patient Care Team: Virgie Dad, MD as PCP - General (Internal Medicine) Carlei Huang X, NP as Nurse Practitioner (Nurse Practitioner) Charlotte Crumb, MD as Consulting Physician (Orthopedic Surgery) Regal, Tamala Fothergill, DPM as Consulting Physician (Podiatry) Gaynelle Arabian, MD as Consulting Physician (Orthopedic Surgery) Zebedee Iba., MD as Referring Physician (Ophthalmology) Newman Pies, MD as Consulting Physician (Neurosurgery) Melina Modena, [facility] (Skilled Nursing and [place]) Terren Jandreau X, NP as Nurse Practitioner (Internal Medicine) Ngetich, Nelda Bucks, NP as Nurse Practitioner (Family Medicine)  Extended Emergency Contact Information Primary Emergency Contact: Donahue,[name] Address: [place], [place] [address] Johnnette Litter of [place] Phone: [phone] Mobile Phone: [phone] Relation: Daughter Secondary Emergency Contact: [place] of [place] Phone: [phone] Relation: Grandson  Code Status:  DNR Goals of care: Advanced Directive information Advanced Directives 03/09/2019  Does Patient Have a Medical Advance Directive? Yes  Type of Advance Directive Out of facility DNR (pink MOST or yellow form)  Does patient want to make changes to medical advance directive? No - Patient declined  Copy of [place] in Chart? -  Pre-existing out of facility DNR order (yellow form or pink MOST form) Pink MOST form placed in chart (order not valid for inpatient use)     Chief Complaint  Patient presents with  . Medical Management of Chronic Issues    HPI:  Pt is a 85 y.o. female seen today for medical management of chronic diseases.   The patient resides in SNF [facility] for safety, care assistance, bed rest/sleeps mostly, eats occasionally, her goal of care is  comfort measures, under Hospice service. Her daughter is at bed side during today's visit. Prn  Morphine 5mg  q2h, Lorazepam 0.5mg  q2h prn for pain, anxiety, agitation available to her. Her constipation is managed with DulcoLax 10mg  suppository prn.    Past Medical History:  Diagnosis Date  . Arthritis   . Cataracts, bilateral   . Closed fracture of unspecified part of lower end of humerus   . Dementia ([place])   . Hypertension   . Insomnia, unspecified   . Macular degeneration (senile) of retina, unspecified   . Osteoarthritis   . Osteoarthrosis, unspecified whether generalized or localized, unspecified site   . Pancreatitis, acute 08/14/2013  . Retinal neovascularization NOS   . Senile osteoporosis   . Subarachnoid hemorrhage following injury ([place]) 03/31/2012  . Unspecified hearing loss   . Unspecified hypothyroidism   . Urinary tract infection 08/10/2013  . Vertebral fracture 08/09/2013   T7 10%, T12 70%    Past Surgical History:  Procedure Laterality Date  . ABDOMINAL HYSTERECTOMY  1942  . ANKLE FRACTURE SURGERY  1960  . BLADDER SUSPENSION    . CLOSED REDUCTION WITH HUMERAL PIN INSERTION  04/01/2012   Procedure: CLOSED REDUCTION WITH HUMERAL PIN INSERTION;  Surgeon: Schuyler Amor, MD;  [facility];  Service: Orthopedics;  Laterality: Left;    No Known Allergies  Allergies as of 04/13/2019   No Known Allergies     Medication List       Accurate as of April 13, 2019  3:07 PM. If you have any questions, ask your nurse or doctor.        bisacodyl 10 MG suppository Commonly known as: DULCOLAX Place  10 mg rectally as needed for moderate constipation.   Boost Glucose Control Liqd Take 237 mLs by mouth daily. Give in afternoon (4:00pm -7:00pm).   ketoconazole 2 % shampoo Commonly known as: NIZORAL Apply 1 application topically. Send shampoo with Daughter to beauty shop with resident q week on Wednesday   LORazepam 0.5 MG tablet Commonly known as: ATIVAN 0.5  mg. Ativan 0.5 mg PO Q2H PRN for anxiety. Every 2 Hours - PRN   morphine 20 MG/ML concentrated solution Commonly known as: ROXANOL Give 0.65ml by mouth every four hours as needed for pain/dyspnea      ROS was provided with assistance of staff and family.  Review of Systems  Constitutional: Positive for activity change, appetite change and fatigue. Negative for chills, diaphoresis and fever.  HENT: Positive for hearing loss. Negative for voice change.   Respiratory: Negative for cough, shortness of breath and wheezing.   Cardiovascular: Negative for chest pain, palpitations and leg swelling.  Gastrointestinal: Negative for abdominal distention, abdominal pain, constipation, diarrhea, nausea and vomiting.  Genitourinary: Negative for difficulty urinating, dysuria and urgency.  Musculoskeletal: Positive for arthralgias.  Skin: Negative for color change and pallor.  Neurological: Positive for weakness. Negative for tremors and headaches.       Dementia, no flaccid all limbs.   Psychiatric/Behavioral: Positive for agitation, behavioral problems and confusion. Negative for hallucinations and sleep disturbance. The patient is nervous/anxious.        Agitated, pushing assistance away    Immunization History  Administered Date(s) Administered  . Influenza-Unspecified 03/02/2013, 02/15/2014, 02/16/2015, 02/22/2016, 02/20/2017, 02/23/2018  . PPD Test 03/23/2012  . Pneumococcal Conjugate-13 12/17/2016  . Pneumococcal Polysaccharide-23 09/22/2002  . Td 11/17/2002  . Tdap 12/16/2016   Pertinent  Health Maintenance Due  Topic Date Due  . INFLUENZA VACCINE  12/12/2018  . DEXA SCAN  Completed  . PNA vac Low Risk Adult  Completed   Fall Risk  12/24/2017 12/13/2016 02/07/2015 02/22/2014 02/22/2014  Falls in the past year? Yes No No Yes No  Number falls in past yr: 1 - - 1 -  Injury with Fall? Yes - - - -  Risk for fall due to : - - - History of fall(s) -   Functional Status Survey:     Vitals:   04/13/19 1448  Pulse: 64  Resp: 16  Temp: (!) 96.3 F (35.7 C)  SpO2: 97%   There is no height or weight on file to calculate BMI. Physical Exam Vitals signs and nursing note reviewed.  Constitutional:      General: She is not in acute distress.    Appearance: Normal appearance. She is not ill-appearing, toxic-appearing or diaphoretic.     Comments: About to wake up upon examination briefly   HENT:     Head: Normocephalic and atraumatic.     Nose: Nose normal.     Mouth/Throat:     Mouth: Mucous membranes are dry.  Eyes:     Extraocular Movements: Extraocular movements intact.     Conjunctiva/sclera: Conjunctivae normal.     Pupils: Pupils are equal, round, and reactive to light.  Neck:     Musculoskeletal: Normal range of motion.  Cardiovascular:     Rate and Rhythm: Normal rate and regular rhythm.     Heart sounds: No murmur.  Pulmonary:     Breath sounds: No wheezing, rhonchi or rales.  Abdominal:     General: Bowel sounds are normal. There is no distension.     Palpations:  Abdomen is soft.     Tenderness: There is no abdominal tenderness. There is no right CVA tenderness, left CVA tenderness, guarding or rebound.  Musculoskeletal:     Right lower leg: No edema.  Skin:    General: Skin is warm and dry.  Neurological:     General: No focal deficit present.     Mental Status: She is alert. Mental status is at baseline.     Motor: No weakness.     Coordination: Coordination normal.     Comments: Oriented to self. The patient's daughter thinks the patient does recognize her  Psychiatric:     Comments: Woke up briefly during my examination, attempted to push me away.      Labs reviewed: Recent Labs    05/21/18 09/01/18  NA 139 139  K 4.6 4.2  CL 105  --   CO2 23  --   BUN 28* 28*  CREATININE 0.7 0.7  CALCIUM 9.1  --    Recent Labs    05/21/18  AST 14  ALT 11  ALKPHOS 51  PROT 6.4  ALBUMIN 3.5   Recent Labs    05/21/18 09/01/18 09/07/18   WBC 11.8 14.6 9.0  HGB 12.9 13.4 12.4  HCT 38 39 36  PLT 223 201 240   Lab Results  Component Value Date   TSH 3.45 05/21/2018   Lab Results  Component Value Date   HGBA1C 6.7 07/18/2016   Lab Results  Component Value Date   CHOL 146 01/23/2017   HDL 39 01/23/2017   LDLCALC 76 01/23/2017   TRIG 214 (A) 01/23/2017    Significant Diagnostic Results in last 30 days:  No results found.  Assessment/Plan Vascular dementia with behavior disturbance ([place]) Continue SNF [facility] for safety, care assistance, near total care, continue Hospice service for comfort measures.   Osteoarthritis Continue prn Morphine for comfort care.   Adult failure to thrive Supportive care  Anxiety Related to advanced dementia, continue SNF [facility] for safety, continue prn Lorazepam for anxiety/comfort measures.   Constipation Stable, continue prn Dulcolax suppository.    Family/ staff Communication: plan of care reviewed with the patient and charge nurse.   Labs/tests ordered:  none  Time spend 25 minutes

## 2019-04-13 NOTE — Assessment & Plan Note (Signed)
Continue SNF [facility] for safety, care assistance, near total care, continue Hospice service for comfort measures.

## 2019-05-24 ENCOUNTER — Non-Acute Institutional Stay (SKILLED_NURSING_FACILITY): Payer: Medicare Other | Admitting: Internal Medicine

## 2019-05-24 ENCOUNTER — Encounter: Payer: Self-pay | Admitting: Internal Medicine

## 2019-05-24 DIAGNOSIS — F01518 Vascular dementia, unspecified severity, with other behavioral disturbance: Secondary | ICD-10-CM

## 2019-05-24 DIAGNOSIS — I10 Essential (primary) hypertension: Secondary | ICD-10-CM

## 2019-05-24 DIAGNOSIS — R627 Adult failure to thrive: Secondary | ICD-10-CM

## 2019-05-24 DIAGNOSIS — F0151 Vascular dementia with behavioral disturbance: Secondary | ICD-10-CM

## 2019-05-24 NOTE — Progress Notes (Signed)
Location:  [place] Room Number: 2A Place of Service:  SNF [phone]) Provider:  Veleta Miners, MD  Virgie Dad, MD  Patient Care Team: Virgie Dad, MD as PCP - General (Internal Medicine) Mast, Man X, NP as Nurse Practitioner (Nurse Practitioner) Charlotte Crumb, MD as Consulting Physician (Orthopedic Surgery) Regal, Tamala Fothergill, DPM as Consulting Physician (Podiatry) Gaynelle Arabian, MD as Consulting Physician (Orthopedic Surgery) Zebedee Iba., MD as Referring Physician (Ophthalmology) Newman Pies, MD as Consulting Physician (Neurosurgery) Melina Modena, [facility] (Skilled Nursing and [place]) Mast, Man X, NP as Nurse Practitioner (Internal Medicine) Ngetich, Nelda Bucks, NP as Nurse Practitioner (Family Medicine)  Extended Emergency Contact Information Primary Emergency Contact: Clubb,[name] Address: [place], [place] [address] Johnnette Litter of [place] Phone: [phone] Mobile Phone: [phone] Relation: Daughter Secondary Emergency Contact: [place] of [place] Phone: [phone] Relation: Grandson  Code Status:  DNR Goals of care: Advanced Directive information Advanced Directives 05/24/2019  Does Patient Have a Medical Advance Directive? Yes  Type of Advance Directive Out of facility DNR (pink MOST or yellow form);Healthcare Power of Attorney  Does patient want to make changes to medical advance directive? No - Patient declined  Copy of [place] in Chart? Yes - validated most recent copy scanned in chart (See row information)  Pre-existing out of facility DNR order (yellow form or pink MOST form) Pink MOST form placed in chart (order not valid for inpatient use);Yellow form placed in chart (order not valid for inpatient use)     Chief Complaint  Patient presents with  . Medical Management of Chronic Issues    Routine visit    HPI:  Pt is a 86  y.o. female seen today for medical management of chronic diseases.   Patient has h/o Hypertension, Hypothyroidism, Dementia with Behavior issues and Osteoporosis And Recurrent Falls And recently Weight Loss with Failure to thrive She is Under Hospice Care  Has been stable. Mostly does not  Response. Per Nurses has Erratic Appetite. Weight seem stable Continues on Roxanol and Ativan. Off other Meds   Past Medical History:  Diagnosis Date  . Arthritis   . Cataracts, bilateral   . Closed fracture of unspecified part of lower end of humerus   . Dementia ([place])   . Hypertension   . Insomnia, unspecified   . Macular degeneration (senile) of retina, unspecified   . Osteoarthritis   . Osteoarthrosis, unspecified whether generalized or localized, unspecified site   . Pancreatitis, acute 08/14/2013  . Retinal neovascularization NOS   . Senile osteoporosis   . Subarachnoid hemorrhage following injury ([place]) 03/31/2012  . Unspecified hearing loss   . Unspecified hypothyroidism   . Urinary tract infection 08/10/2013  . Vertebral fracture 08/09/2013   T7 10%, T12 70%    Past Surgical History:  Procedure Laterality Date  . ABDOMINAL HYSTERECTOMY  1942  . ANKLE FRACTURE SURGERY  1960  . BLADDER SUSPENSION    . CLOSED REDUCTION WITH HUMERAL PIN INSERTION  04/01/2012   Procedure: CLOSED REDUCTION WITH HUMERAL PIN INSERTION;  Surgeon: Schuyler Amor, MD;  [facility];  Service: Orthopedics;  Laterality: Left;    No Known Allergies  Outpatient Encounter Medications as of 05/24/2019  Medication Sig  . bisacodyl (DULCOLAX) 10 MG suppository Place 10 mg rectally as needed for moderate constipation.  Marland Kitchen ketoconazole (NIZORAL) 2 % shampoo Apply 1  application topically. Send shampoo with Daughter to beauty shop with resident q week on Wednesday   . LORazepam (ATIVAN) 0.5 MG tablet 0.5 mg. Ativan 0.5 mg PO Q2H PRN for anxiety. Every 2 Hours - PRN  . morphine (ROXANOL) 20 MG/ML concentrated  solution Give 0.72ml by mouth every four hours as needed for pain/dyspnea  . Nutritional Supplements (BOOST GLUCOSE CONTROL) LIQD Take 237 mLs by mouth daily. Give in afternoon (4:00pm -7:00pm).   No facility-administered encounter medications on file as of 05/24/2019.    Review of Systems  Unable to perform ROS: Other    Immunization History  Administered Date(s) Administered  . Influenza-Unspecified 03/02/2013, 02/15/2014, 02/16/2015, 02/22/2016, 02/20/2017, 02/23/2018  . PPD Test 03/23/2012  . Pneumococcal Conjugate-13 12/17/2016  . Pneumococcal Polysaccharide-23 09/22/2002  . Td 11/17/2002  . Tdap 12/16/2016   Pertinent  Health Maintenance Due  Topic Date Due  . INFLUENZA VACCINE  12/12/2018  . DEXA SCAN  Completed  . PNA vac Low Risk Adult  Completed   Fall Risk  12/24/2017 12/13/2016 02/07/2015 02/22/2014 02/22/2014  Falls in the past year? Yes No No Yes No  Number falls in past yr: 1 - - 1 -  Injury with Fall? Yes - - - -  Risk for fall due to : - - - History of fall(s) -   Functional Status Survey:    Vitals:   05/24/19 1625  BP: (!) 153/87  Pulse: 89  Resp: 20  Temp: (!) 97 F (36.1 C)  TempSrc: Oral  SpO2: 94%  Weight: 109 lb 14.4 oz (49.9 kg)  Height: 5\' 1"  (1.549 m)   Body mass index is 20.77 kg/m. Physical Exam Vitals reviewed.  HENT:     Head: Normocephalic.     Nose: Nose normal.     Mouth/Throat:     Mouth: Mucous membranes are dry.  Eyes:     Pupils: Pupils are equal, round, and reactive to light.  Cardiovascular:     Rate and Rhythm: Normal rate and regular rhythm.     Pulses: Normal pulses.  Pulmonary:     Effort: Pulmonary effort is normal. No respiratory distress.     Breath sounds: Normal breath sounds. No wheezing.  Abdominal:     General: Abdomen is flat. Bowel sounds are normal.     Palpations: Abdomen is soft.  Musculoskeletal:        General: No swelling.     Cervical back: Neck supple.  Skin:    General: Skin is warm and  dry.  Neurological:     Mental Status: She is alert.     Comments: Opens her Eyes but does not respond  Psychiatric:        Mood and Affect: Mood normal.     Labs reviewed: Recent Labs    09/01/18 0000  NA 139  K 4.2  BUN 28*  CREATININE 0.7   No results for input(s): AST, ALT, ALKPHOS, BILITOT, PROT, ALBUMIN in the last 8760 hours. Recent Labs    09/01/18 0000 09/07/18 0000  WBC 14.6 9.0  HGB 13.4 12.4  HCT 39 36  PLT 201 240   Lab Results  Component Value Date   TSH 3.45 05/21/2018   Lab Results  Component Value Date   HGBA1C 6.7 07/18/2016   Lab Results  Component Value Date   CHOL 146 01/23/2017   HDL 39 01/23/2017   LDLCALC 76 01/23/2017   TRIG 214 (A) 01/23/2017    Significant Diagnostic Results in  last 30 days:   Assessment/Plan  End stage Dementia with Failure to thrive and Possible stroke Continue on Ativan and Roxanol Under Hospice Care No Aggressive measures She seems Comfortable  Family/ staff Communication:   Labs/tests ordered:    Total time spent in this patient care encounter was  25_  minutes; greater than 50% of the visit spent counseling  staff, reviewing records , Labs and coordinating care for problems addressed at this encounter.

## 2019-06-09 ENCOUNTER — Other Ambulatory Visit: Payer: Self-pay | Admitting: *Deleted

## 2019-06-09 MED ORDER — LORAZEPAM 0.5 MG PO TABS: ORAL_TABLET | ORAL | 0 refills | Status: DC

## 2019-06-09 NOTE — Telephone Encounter (Signed)
Received fax from [facility] and sent to Dr. Gupta for approval.  

## 2019-06-22 ENCOUNTER — Non-Acute Institutional Stay (SKILLED_NURSING_FACILITY): Payer: Medicare Other | Admitting: Nurse Practitioner

## 2019-06-22 ENCOUNTER — Encounter: Payer: Self-pay | Admitting: Nurse Practitioner

## 2019-06-22 DIAGNOSIS — R627 Adult failure to thrive: Secondary | ICD-10-CM

## 2019-06-22 DIAGNOSIS — F419 Anxiety disorder, unspecified: Secondary | ICD-10-CM

## 2019-06-22 DIAGNOSIS — M8949 Other hypertrophic osteoarthropathy, multiple sites: Secondary | ICD-10-CM | POA: Diagnosis not present

## 2019-06-22 DIAGNOSIS — M159 Polyosteoarthritis, unspecified: Secondary | ICD-10-CM

## 2019-06-22 NOTE — Assessment & Plan Note (Signed)
End of life care, continue Hospice Service, continue prn Lorazepam 0.5mg  q2h prn x 30 days.

## 2019-06-22 NOTE — Assessment & Plan Note (Signed)
In general/multiple sites, continue prn Morphine for comfort care.

## 2019-06-22 NOTE — Progress Notes (Signed)
Location:   SNF [place] Room Number: 2 Place of Service:  SNF (31)SNF [facility] Provider: [facility] Major Santerre NP  Virgie Dad, MD  Patient Care Team: Virgie Dad, MD as PCP - General (Internal Medicine) Zyionna Pesce X, NP as Nurse Practitioner (Nurse Practitioner) Charlotte Crumb, MD as Consulting Physician (Orthopedic Surgery) Regal, Tamala Fothergill, DPM as Consulting Physician (Podiatry) Gaynelle Arabian, MD as Consulting Physician (Orthopedic Surgery) Zebedee Iba., MD as Referring Physician (Ophthalmology) Newman Pies, MD as Consulting Physician (Neurosurgery) Melina Modena, [facility] (Skilled Nursing and [place]) Shaney Deckman X, NP as Nurse Practitioner (Internal Medicine) Ngetich, Nelda Bucks, NP as Nurse Practitioner (Family Medicine)  Extended Emergency Contact Information Primary Emergency Contact: Oloughlin,[name] Address: [place], [place] [address] Johnnette Litter of [place] Phone: [phone] Mobile Phone: [phone] Relation: Daughter Secondary Emergency Contact: [place] of [place] Phone: [phone] Relation: Grandson  Code Status:  DNR Goals of care: Advanced Directive information Advanced Directives 06/22/2019  Does Patient Have a Medical Advance Directive? Yes  Type of Paramedic of [place];Living will;Out of facility DNR (pink MOST or yellow form)  Does patient want to make changes to medical advance directive? No - Patient declined  Copy of [place] in Chart? Yes - validated most recent copy scanned in chart (See row information)  Pre-existing out of facility DNR order (yellow form or pink MOST form) Pink MOST/Yellow Form most recent copy in chart - Physician notified to receive inpatient order     Chief Complaint  Patient presents with  . Acute Visit    Anxiety    HPI:  Pt is a 86 y.o. female seen today for evaluation of her anxiety. Her  goal of care is comfort measures, under Hospice service. Prn Lorazepam q2 hours used frequently in the past 2 weeks, effective, meet her needs. Her pain is managed with prn Morphine 5mg  q4h.    Past Medical History:  Diagnosis Date  . Arthritis   . Cataracts, bilateral   . Closed fracture of unspecified part of lower end of humerus   . Dementia ([place])   . Hypertension   . Insomnia, unspecified   . Macular degeneration (senile) of retina, unspecified   . Osteoarthritis   . Osteoarthrosis, unspecified whether generalized or localized, unspecified site   . Pancreatitis, acute 08/14/2013  . Retinal neovascularization NOS   . Senile osteoporosis   . Subarachnoid hemorrhage following injury ([place]) 03/31/2012  . Unspecified hearing loss   . Unspecified hypothyroidism   . Urinary tract infection 08/10/2013  . Vertebral fracture 08/09/2013   T7 10%, T12 70%    Past Surgical History:  Procedure Laterality Date  . ABDOMINAL HYSTERECTOMY  1942  . ANKLE FRACTURE SURGERY  1960  . BLADDER SUSPENSION    . CLOSED REDUCTION WITH HUMERAL PIN INSERTION  04/01/2012   Procedure: CLOSED REDUCTION WITH HUMERAL PIN INSERTION;  Surgeon: Schuyler Amor, MD;  Location: [facility];  Service: Orthopedics;  Laterality: Left;    No Known Allergies  Allergies as of 06/22/2019   No Known Allergies     Medication List       Accurate as of June 22, 2019  7:56 PM. If you have any questions, ask your nurse or doctor.        STOP taking these medications   LORazepam 0.5 MG tablet Commonly known as: ATIVAN Stopped by: Paul Torpey  X Christoffer Currier, NP     TAKE these medications   bisacodyl 10 MG suppository Commonly known as: DULCOLAX Place 10 mg rectally as needed for moderate constipation.   Boost Glucose Control Liqd Take 237 mLs by mouth daily. Give in afternoon (4:00pm -7:00pm).   ketoconazole 2 % shampoo Commonly known as: NIZORAL Apply 1 application topically. Send shampoo with Daughter to beauty shop with  resident q week on Wednesday   morphine 20 MG/ML concentrated solution Commonly known as: ROXANOL Give 0.40ml by mouth every four hours as needed for pain/dyspnea       Review of Systems  Unable to perform ROS: Dementia    Immunization History  Administered Date(s) Administered  . Influenza-Unspecified 03/02/2013, 02/15/2014, 02/16/2015, 02/22/2016, 02/20/2017, 02/23/2018  . Moderna SARS-COVID-2 Vaccination 05/17/2019  . PPD Test 03/23/2012  . Pneumococcal Conjugate-13 12/17/2016  . Pneumococcal Polysaccharide-23 09/22/2002  . Td 11/17/2002  . Tdap 12/16/2016   Pertinent  Health Maintenance Due  Topic Date Due  . INFLUENZA VACCINE  12/12/2018  . DEXA SCAN  Completed  . PNA vac Low Risk Adult  Completed   Fall Risk  12/24/2017 12/13/2016 02/07/2015 02/22/2014 02/22/2014  Falls in the past year? Yes No No Yes No  Number falls in past yr: 1 - - 1 -  Injury with Fall? Yes - - - -  Risk for fall due to : - - - History of fall(s) -   Functional Status Survey:    Vitals:   06/22/19 1501  BP: (!) 153/87  Pulse: 85  Resp: 18  Temp: (!) 97.4 F (36.3 C)  SpO2: 95%  Weight: 109 lb (49.4 kg)  Height: 5\' 1"  (1.549 m)   Body mass index is 20.6 kg/m. Physical Exam Vitals and nursing note reviewed.  Constitutional:      General: She is not in acute distress.    Appearance: She is not ill-appearing, toxic-appearing or diaphoretic.     Comments: frail  HENT:     Head: Normocephalic and atraumatic.     Nose: Nose normal.     Mouth/Throat:     Mouth: Mucous membranes are dry.  Eyes:     Extraocular Movements: Extraocular movements intact.     Conjunctiva/sclera: Conjunctivae normal.     Pupils: Pupils are equal, round, and reactive to light.  Cardiovascular:     Rate and Rhythm: Normal rate and regular rhythm.     Heart sounds: No murmur.  Pulmonary:     Breath sounds: No wheezing, rhonchi or rales.  Abdominal:     General: Bowel sounds are normal. There is no  distension.     Palpations: Abdomen is soft.     Tenderness: There is no abdominal tenderness. There is no right CVA tenderness, left CVA tenderness, guarding or rebound.  Musculoskeletal:     Cervical back: Normal range of motion and neck supple.     Right lower leg: No edema.     Left lower leg: No edema.  Skin:    General: Skin is warm and dry.  Neurological:     General: No focal deficit present.     Mental Status: She is alert. Mental status is at baseline.     Motor: No weakness.     Coordination: Coordination normal.     Gait: Gait abnormal.     Comments: Opened her eyes briefly.   Psychiatric:        Mood and Affect: Mood normal.  Behavior: Behavior normal.     Labs reviewed: Recent Labs    09/01/18 0000  NA 139  K 4.2  BUN 28*  CREATININE 0.7   No results for input(s): AST, ALT, ALKPHOS, BILITOT, PROT, ALBUMIN in the last 8760 hours. Recent Labs    09/01/18 0000 09/07/18 0000  WBC 14.6 9.0  HGB 13.4 12.4  HCT 39 36  PLT 201 240   Lab Results  Component Value Date   TSH 3.45 05/21/2018   Lab Results  Component Value Date   HGBA1C 6.7 07/18/2016   Lab Results  Component Value Date   CHOL 146 01/23/2017   HDL 39 01/23/2017   LDLCALC 76 01/23/2017   TRIG 214 (A) 01/23/2017    Significant Diagnostic Results in last 30 days:  No results found.  Assessment/Plan Anxiety End of life care, continue Hospice Service, continue prn Lorazepam 0.5mg  q2h prn x 30 days.   Adult failure to thrive Persisted, her goal of care is comfort measures, continue Prn Morphine for pain or respiratory symptoms. Continue Hospice service.   Osteoarthritis In general/multiple sites, continue prn Morphine for comfort care.    Family/ staff Communication: plan of care reviewed with the patient and charge nurse.   Labs/tests ordered: none  Time spend 25 minutes.

## 2019-06-22 NOTE — Assessment & Plan Note (Signed)
Persisted, her goal of care is comfort measures, continue Prn Morphine for pain or respiratory symptoms. Continue Hospice service.

## 2019-07-06 ENCOUNTER — Encounter: Payer: Self-pay | Admitting: Nurse Practitioner

## 2019-07-06 ENCOUNTER — Non-Acute Institutional Stay (SKILLED_NURSING_FACILITY): Payer: Medicare Other | Admitting: Nurse Practitioner

## 2019-07-06 DIAGNOSIS — M8949 Other hypertrophic osteoarthropathy, multiple sites: Secondary | ICD-10-CM | POA: Diagnosis not present

## 2019-07-06 DIAGNOSIS — M159 Polyosteoarthritis, unspecified: Secondary | ICD-10-CM

## 2019-07-06 DIAGNOSIS — F0151 Vascular dementia with behavioral disturbance: Secondary | ICD-10-CM

## 2019-07-06 DIAGNOSIS — F01518 Vascular dementia, unspecified severity, with other behavioral disturbance: Secondary | ICD-10-CM

## 2019-07-06 NOTE — Assessment & Plan Note (Signed)
Her behaviors is managed, continue Risperidone 0.25mg  bid, prn Lorazepam 0.5mg  q2hr.

## 2019-07-06 NOTE — Progress Notes (Signed)
Location:   [place] Room Number: 2 Place of Service:  SNF (31) Provider:  Avyon Herendeen NP  Virgie Dad, MD  Patient Care Team: Virgie Dad, MD as PCP - General (Internal Medicine) Marda Breidenbach X, NP as Nurse Practitioner (Nurse Practitioner) Charlotte Crumb, MD as Consulting Physician (Orthopedic Surgery) Wallene Huh, DPM as Consulting Physician (Podiatry) Gaynelle Arabian, MD as Consulting Physician (Orthopedic Surgery) Zebedee Iba., MD as Referring Physician (Ophthalmology) Newman Pies, MD as Consulting Physician (Neurosurgery) Melina Modena, [facility] (Skilled Nursing and [place]) Lorely Bubb X, NP as Nurse Practitioner (Internal Medicine) Ngetich, Nelda Bucks, NP as Nurse Practitioner (Family Medicine)  Extended Emergency Contact Information Primary Emergency Contact: Spraker,[name] Address: [place], [place] [address] [address]nette [address]itter of [place] Phone: [phone] Mobile Phone: [phone] Relation: Daughter Secondary Emergency Contact: [place] of [place] Phone: [phone] Relation: Grandson  Code Status:  DNR Goals of care: Advanced Directive information Advanced Directives 07/06/2019  Does Patient Have a Medical Advance Directive? Yes  Type of Advance Directive Out of facility DNR (pink MOST or yellow form);Living will;Healthcare Power of Attorney  Does patient want to make changes to medical advance directive? No - Patient declined  Copy of [place] in Chart? Yes - validated most recent copy scanned in chart (See row information)  Pre-existing out of facility DNR order (yellow form or pink MOST form) Yellow form placed in chart (order not valid for inpatient use);Pink MOST form placed in chart (order not valid for inpatient use)     Chief Complaint  Patient presents with  . Medical Management of Chronic Issues    HPI:  Pt is a 86 y.o.  female seen today for medical management of chronic diseases.  The patient resides in [facility] for safety, care assistance, her goal of care is comfort measures, under Hospice service, on Risperidone and Lorazepam  for behaviors, agitation. Her pain is managed with Morphine.     Past Medical History:  Diagnosis Date  . Arthritis   . Cataracts, bilateral   . Closed fracture of unspecified part of lower end of humerus   . Dementia ([place])   . Hypertension   . Insomnia, unspecified   . Macular degeneration (senile) of retina, unspecified   . Osteoarthritis   . Osteoarthrosis, unspecified whether generalized or localized, unspecified site   . Pancreatitis, acute 08/14/2013  . Retinal neovascularization NOS   . Senile osteoporosis   . Subarachnoid hemorrhage following injury ([place]) 03/31/2012  . Unspecified hearing loss   . Unspecified hypothyroidism   . Urinary tract infection 08/10/2013  . Vertebral fracture 08/09/2013   T7 10%, T12 70%    Past Surgical History:  Procedure Laterality Date  . ABDOMINAL HYSTERECTOMY  1942  . ANKLE FRACTURE SURGERY  1960  . BLADDER SUSPENSION    . CLOSED REDUCTION WITH HUMERAL PIN INSERTION  04/01/2012   Procedure: CLOSED REDUCTION WITH HUMERAL PIN INSERTION;  Surgeon: Schuyler Amor, MD;  [facility];  Service: Orthopedics;  Laterality: Left;    No Known Allergies  Allergies as of 07/06/2019   No Known Allergies     Medication List       Accurate as of July 06, 2019  4:05 PM. If you have any questions, ask your nurse or doctor.        bisacodyl 10 MG suppository Commonly known as:  DULCOLAX Place 10 mg rectally as needed for moderate constipation.   Boost Glucose Control Liqd Take 237 mLs by mouth daily. Give in afternoon (4:00pm -7:00pm).   ketoconazole 2 % shampoo Commonly known as: NIZORAL Apply 1 application topically. Send shampoo with Daughter to beauty shop with resident q week on Wednesday   morphine 20 MG/ML  concentrated solution Commonly known as: ROXANOL Give 0.65ml by mouth every four hours as needed for pain/dyspnea       Review of Systems  Unable to perform ROS: Dementia    Immunization History  Administered Date(s) Administered  . Influenza, High Dose Seasonal PF 02/25/2019  . Influenza-Unspecified 03/02/2013, 02/15/2014, 02/16/2015, 02/22/2016, 02/20/2017, 02/23/2018  . Moderna SARS-COVID-2 Vaccination 05/17/2019  . PPD Test 03/23/2012  . Pneumococcal Conjugate-13 12/17/2016  . Pneumococcal Polysaccharide-23 09/22/2002  . Td 11/17/2002  . Tdap 12/16/2016   Pertinent  Health Maintenance Due  Topic Date Due  . INFLUENZA VACCINE  Completed  . DEXA SCAN  Completed  . PNA vac Low Risk Adult  Completed   Fall Risk  12/24/2017 12/13/2016 02/07/2015 02/22/2014 02/22/2014  Falls in the past year? Yes No No Yes No  Number falls in past yr: 1 - - 1 -  Injury with Fall? Yes - - - -  Risk for fall due to : - - - History of fall(s) -   Functional Status Survey:    There were no vitals filed for this visit. There is no height or weight on file to calculate BMI. Physical Exam Vitals and nursing note reviewed.  Constitutional:      General: She is not in acute distress.    Appearance: Normal appearance. She is not ill-appearing.     Comments: frail  HENT:     Head: Normocephalic and atraumatic.     Nose: Nose normal.     Mouth/Throat:     Mouth: Mucous membranes are dry.  Eyes:     Extraocular Movements: Extraocular movements intact.     Conjunctiva/sclera: Conjunctivae normal.     Pupils: Pupils are equal, round, and reactive to light.  Cardiovascular:     Rate and Rhythm: Normal rate and regular rhythm.     Heart sounds: No murmur.  Pulmonary:     Breath sounds: No wheezing or rales.  Abdominal:     General: Bowel sounds are normal. There is no distension.     Palpations: Abdomen is soft.     Tenderness: There is no abdominal tenderness. There is no guarding or rebound.    Musculoskeletal:     Cervical back: Normal range of motion and neck supple.     Right lower leg: No edema.     Left lower leg: No edema.  Skin:    General: Skin is warm and dry.  Neurological:     General: No focal deficit present.     Mental Status: She is alert. Mental status is at baseline.     Motor: No weakness.     Coordination: Coordination normal.     Gait: Gait abnormal.     Comments: Blinks her eyes, not conversing.   Psychiatric:        Mood and Affect: Mood normal.        Behavior: Behavior normal.     Labs reviewed: Recent Labs    09/01/18 0000  NA 139  K 4.2  BUN 28*  CREATININE 0.7   No results for input(s): AST, ALT, ALKPHOS, BILITOT, PROT, ALBUMIN in the last  8760 hours. Recent Labs    09/01/18 0000 09/07/18 0000  WBC 14.6 9.0  HGB 13.4 12.4  HCT 39 36  PLT 201 240   Lab Results  Component Value Date   TSH 3.45 05/21/2018   Lab Results  Component Value Date   HGBA1C 6.7 07/18/2016   Lab Results  Component Value Date   CHOL 146 01/23/2017   HDL 39 01/23/2017   LDLCALC 76 01/23/2017   TRIG 214 (A) 01/23/2017    Significant Diagnostic Results in last 30 days:  No results found.  Assessment/Plan Vascular dementia with behavior disturbance ([place]) Her behaviors is managed, continue Risperidone 0.25mg  bid, prn Lorazepam 0.5mg  q2hr.   Osteoarthritis Her pain is controlled, continue Morphine q2hr prn.     Family/ staff Communication: plan of care reviewed with the patient and charge nurse.   Labs/tests ordered:  None  Time spend 25 minutes.

## 2019-07-06 NOTE — Assessment & Plan Note (Signed)
Her pain is controlled, continue Morphine q2hr prn.

## 2019-08-02 ENCOUNTER — Encounter: Payer: Self-pay | Admitting: Internal Medicine

## 2019-08-02 ENCOUNTER — Non-Acute Institutional Stay (SKILLED_NURSING_FACILITY): Payer: Medicare Other | Admitting: Internal Medicine

## 2019-08-02 DIAGNOSIS — F01518 Vascular dementia, unspecified severity, with other behavioral disturbance: Secondary | ICD-10-CM

## 2019-08-02 DIAGNOSIS — R627 Adult failure to thrive: Secondary | ICD-10-CM

## 2019-08-02 DIAGNOSIS — L89622 Pressure ulcer of left heel, stage 2: Secondary | ICD-10-CM

## 2019-08-02 DIAGNOSIS — F0151 Vascular dementia with behavioral disturbance: Secondary | ICD-10-CM | POA: Diagnosis not present

## 2019-08-02 DIAGNOSIS — I639 Cerebral infarction, unspecified: Secondary | ICD-10-CM | POA: Diagnosis not present

## 2019-08-02 NOTE — Progress Notes (Signed)
Location:   [place] Room Number: 2 Place of Service:  SNF [phone]) Provider:  Virgie Dad, MD  Virgie Dad, MD  Patient Care Team: Virgie Dad, MD as PCP - General (Internal Medicine) Mast, Man X, NP as Nurse Practitioner (Nurse Practitioner) Charlotte Crumb, MD as Consulting Physician (Orthopedic Surgery) Regal, Tamala Fothergill, DPM as Consulting Physician (Podiatry) Gaynelle Arabian, MD as Consulting Physician (Orthopedic Surgery) Zebedee Iba., MD as Referring Physician (Ophthalmology) Newman Pies, MD as Consulting Physician (Neurosurgery) Melina Modena, [facility] (Skilled Nursing and [place]) Mast, Man X, NP as Nurse Practitioner (Internal Medicine) Ngetich, Nelda Bucks, NP as Nurse Practitioner (Family Medicine)  Extended Emergency Contact Information Primary Emergency Contact: Ferner,[name] Address: [place], [place] [address] [address]nette Litter of [place] Phone: [phone] Mobile Phone: [phone] Relation: Daughter Secondary Emergency Contact: [place] of [place] Phone: [phone] Relation: Grandson  Code Status:  DNR Goals of care: Advanced Directive information Advanced Directives 08/02/2019  Does Patient Have a Medical Advance Directive? Yes  Type of Advance Directive Out of facility DNR (pink MOST or yellow form);Living will;Healthcare Power of Attorney  Does patient want to make changes to medical advance directive? No - Patient declined  Copy of [place] in Chart? Yes - validated most recent copy scanned in chart (See row information)  Pre-existing out of facility DNR order (yellow form or pink MOST form) Yellow form placed in chart (order not valid for inpatient use);Pink MOST form placed in chart (order not valid for inpatient use)     Chief Complaint  Patient presents with  . Acute Visit    Pressure wound    HPI:  Pt is a 86 y.o.  female seen today for an acute visit for Pressure Heel Wound Stage 2  Patient has h/o Hypertension, Hypothyroidism, Dementia with Behavior issues and Osteoporosis And Recurrent Falls And recently Weight Loss with Failure to thrive She is Under Hospice Care  Patient was seen today for Left Heel Pressure wound Nurses noticed that heel pressure wound.  Patient is unable to give any history she mostly does not respond.  Has been stable weight has been stable appetite is erratic. Continues on Roxanol Ativan and Risperdal for comfort care   Past Medical History:  Diagnosis Date  . Arthritis   . Cataracts, bilateral   . Closed fracture of unspecified part of lower end of humerus   . Dementia ([place])   . Hypertension   . Insomnia, unspecified   . Macular degeneration (senile) of retina, unspecified   . Osteoarthritis   . Osteoarthrosis, unspecified whether generalized or localized, unspecified site   . Pancreatitis, acute 08/14/2013  . Retinal neovascularization NOS   . Senile osteoporosis   . Subarachnoid hemorrhage following injury ([place]) 03/31/2012  . Unspecified hearing loss   . Unspecified hypothyroidism   . Urinary tract infection 08/10/2013  . Vertebral fracture 08/09/2013   T7 10%, T12 70%    Past Surgical History:  Procedure Laterality Date  . ABDOMINAL HYSTERECTOMY  1942  . ANKLE FRACTURE SURGERY  1960  . BLADDER SUSPENSION    . CLOSED REDUCTION WITH HUMERAL PIN INSERTION  04/01/2012   Procedure: CLOSED REDUCTION WITH HUMERAL PIN INSERTION;  Surgeon: Schuyler Amor, MD;  [facility];  Service: Orthopedics;  Laterality: Left;    No Known Allergies  Allergies as of 08/02/2019   No  Known Allergies     Medication List       Accurate as of August 02, 2019  1:29 PM. If you have any questions, ask your nurse or doctor.        acetaminophen 650 MG suppository Commonly known as: TYLENOL Place 650 mg rectally every 4 (four) hours as needed.   Artificial Tears 1.4 %  ophthalmic solution Generic drug: polyvinyl alcohol Place 1 drop into both eyes in the morning and at bedtime.   bisacodyl 10 MG suppository Commonly known as: DULCOLAX Place 10 mg rectally as needed for moderate constipation.   Boost Glucose Control Liqd Take 237 mLs by mouth daily. Give in afternoon (4:00pm -7:00pm).   ketoconazole 2 % shampoo Commonly known as: NIZORAL Apply 1 application topically. Send shampoo with Daughter to beauty shop with resident q week on Wednesday   LORazepam 0.5 MG tablet Commonly known as: ATIVAN Take 0.5 mg by mouth every 2 (two) hours as needed for anxiety.   morphine 20 MG/ML concentrated solution Commonly known as: ROXANOL Give 0.98ml by mouth every four hours as needed for pain/dyspnea   risperiDONE 0.5 MG tablet Commonly known as: RISPERDAL Take 0.25 mg by mouth 2 (two) times daily.       Review of Systems  Unable to perform ROS: Dementia    Immunization History  Administered Date(s) Administered  . Influenza, High Dose Seasonal PF 02/25/2019  . Influenza-Unspecified 03/02/2013, 02/15/2014, 02/16/2015, 02/22/2016, 02/20/2017, 02/23/2018  . Moderna SARS-COVID-2 Vaccination 05/17/2019  . PPD Test 03/23/2012  . Pneumococcal Conjugate-13 12/17/2016  . Pneumococcal Polysaccharide-23 09/22/2002  . Td 11/17/2002  . Tdap 12/16/2016   Pertinent  Health Maintenance Due  Topic Date Due  . INFLUENZA VACCINE  Completed  . DEXA SCAN  Completed  . PNA vac Low Risk Adult  Completed   Fall Risk  12/24/2017 12/13/2016 02/07/2015 02/22/2014 02/22/2014  Falls in the past year? Yes No No Yes No  Number falls in past yr: 1 - - 1 -  Injury with Fall? Yes - - - -  Risk for fall due to : - - - History of fall(s) -   Functional Status Survey:    Vitals:   08/02/19 1323  BP: 97/63  Pulse: 69  Resp: (!) 22  Temp: (!) 97 F (36.1 C)  SpO2: 96%  Weight: 109 lb 14.4 oz (49.9 kg)  Height: 5\' 1"  (1.549 m)   Body mass index is 20.77  kg/m. Physical Exam Vitals reviewed.  HENT:     Head: Normocephalic.     Nose: Nose normal.     Mouth/Throat:     Mouth: Mucous membranes are moist.     Pharynx: Oropharynx is clear.  Eyes:     Pupils: Pupils are equal, round, and reactive to light.  Cardiovascular:     Rate and Rhythm: Normal rate and regular rhythm.     Pulses: Normal pulses.  Pulmonary:     Effort: Pulmonary effort is normal.     Breath sounds: Normal breath sounds.  Abdominal:     General: Abdomen is flat. Bowel sounds are normal.     Palpations: Abdomen is soft.  Musculoskeletal:        General: No swelling.     Cervical back: Neck supple.  Skin:    Comments: Has stage II pressure wound on left heel.  No signs of any infection  Neurological:     General: No focal deficit present.     Mental Status: She  is alert.  Psychiatric:        Mood and Affect: Mood normal.        Thought Content: Thought content normal.     Labs reviewed: Recent Labs    09/01/18 0000  NA 139  K 4.2  BUN 28*  CREATININE 0.7   No results for input(s): AST, ALT, ALKPHOS, BILITOT, PROT, ALBUMIN in the last 8760 hours. Recent Labs    09/01/18 0000 09/07/18 0000  WBC 14.6 9.0  HGB 13.4 12.4  HCT 39 36  PLT 201 240   Lab Results  Component Value Date   TSH 3.45 05/21/2018   Lab Results  Component Value Date   HGBA1C 6.7 07/18/2016   Lab Results  Component Value Date   CHOL 146 01/23/2017   HDL 39 01/23/2017   LDLCALC 76 01/23/2017   TRIG 214 (A) 01/23/2017    Significant Diagnostic Results in last 30 days:  No results found.  Assessment/Plan Pressure injury of left heel, stage 2 (HCC) Allevyn daily discussed with the nurse  Adult failure to thrive Supportive care Does not want anything aggressive On Roxanol and Ativan  Vascular dementia with behavior disturbance ([place]) On Risperdal per hospice.       Family/ staff Communication:   Labs/tests ordered:   Total time spent in this patient  care encounter was  25_  minutes; greater than 50% of the visit spent counseling  staff, reviewing records , Labs and coordinating care for problems addressed at this encounter.

## 2019-08-03 ENCOUNTER — Encounter: Payer: Self-pay | Admitting: Nurse Practitioner

## 2019-08-03 ENCOUNTER — Non-Acute Institutional Stay (SKILLED_NURSING_FACILITY): Payer: Medicare Other | Admitting: Nurse Practitioner

## 2019-08-03 DIAGNOSIS — M159 Polyosteoarthritis, unspecified: Secondary | ICD-10-CM

## 2019-08-03 DIAGNOSIS — F01518 Vascular dementia, unspecified severity, with other behavioral disturbance: Secondary | ICD-10-CM

## 2019-08-03 DIAGNOSIS — K5900 Constipation, unspecified: Secondary | ICD-10-CM

## 2019-08-03 DIAGNOSIS — M8949 Other hypertrophic osteoarthropathy, multiple sites: Secondary | ICD-10-CM

## 2019-08-03 DIAGNOSIS — F0151 Vascular dementia with behavioral disturbance: Secondary | ICD-10-CM

## 2019-08-03 DIAGNOSIS — F419 Anxiety disorder, unspecified: Secondary | ICD-10-CM | POA: Diagnosis not present

## 2019-08-03 NOTE — Assessment & Plan Note (Signed)
Pain is controlled, continue prn Morphine, activity as tolerated.

## 2019-08-03 NOTE — Progress Notes (Signed)
Location:   [facility] Room Number: 2 Place of Service:  SNF (31) Provider:  Hendricks Schwandt NP  Virgie Dad, MD  Patient Care Team: Virgie Dad, MD as PCP - General (Internal Medicine) Burns Timson X, NP as Nurse Practitioner (Nurse Practitioner) Charlotte Crumb, MD as Consulting Physician (Orthopedic Surgery) Wallene Huh, DPM as Consulting Physician (Podiatry) Gaynelle Arabian, MD as Consulting Physician (Orthopedic Surgery) Zebedee Iba., MD as Referring Physician (Ophthalmology) Newman Pies, MD as Consulting Physician (Neurosurgery) Melina Modena, [facility] (Skilled Nursing and [place]) Hadrian Yarbrough X, NP as Nurse Practitioner (Internal Medicine) Ngetich, Nelda Bucks, NP as Nurse Practitioner (Family Medicine)  Extended Emergency Contact Information Primary Emergency Contact: Losey,[name] Address: [place], [place] [address] Johnnette Litter of [place] Phone: [phone] Mobile Phone: [phone] Relation: Daughter Secondary Emergency Contact: [place] of [place] Phone: [phone] Relation: Grandson  Code Status:  DNR Goals of care: Advanced Directive information Advanced Directives 08/03/2019  Does Patient Have a Medical Advance Directive? Yes  Type of Paramedic of [place];Out of facility DNR (pink MOST or yellow form);Living will  Does patient want to make changes to medical advance directive? No - Patient declined  Copy of [place] in Chart? Yes - validated most recent copy scanned in chart (See row information)  Pre-existing out of facility DNR order (yellow form or pink MOST form) Yellow form placed in chart (order not valid for inpatient use);Pink MOST form placed in chart (order not valid for inpatient use)     Chief Complaint  Patient presents with  . Medical Management of Chronic Issues    HPI:  Pt is a 86 y.o.  female seen today for medical management of chronic diseases.    The patient resides in SN[facility] for safety, care assistance, Hx of advanced dementia, her goal of care is comfort measures, under Hospice service, her mood is stable on Risperidone 0.25mg  bid, Lorazepam 0.5mg  q2h prn. OA pain, stable, on Morphine 5mg  q4hr pen. Constipation is managed iwht prn Bisacodyl 10mg  suppository.    Past Medical History:  Diagnosis Date  . Arthritis   . Cataracts, bilateral   . Closed fracture of unspecified part of lower end of humerus   . Dementia ([place])   . Hypertension   . Insomnia, unspecified   . Macular degeneration (senile) of retina, unspecified   . Osteoarthritis   . Osteoarthrosis, unspecified whether generalized or localized, unspecified site   . Pancreatitis, acute 08/14/2013  . Retinal neovascularization NOS   . Senile osteoporosis   . Subarachnoid hemorrhage following injury ([place]) 03/31/2012  . Unspecified hearing loss   . Unspecified hypothyroidism   . Urinary tract infection 08/10/2013  . Vertebral fracture 08/09/2013   T7 10%, T12 70%    Past Surgical History:  Procedure Laterality Date  . ABDOMINAL HYSTERECTOMY  1942  . ANKLE FRACTURE SURGERY  1960  . BLADDER SUSPENSION    . CLOSED REDUCTION WITH HUMERAL PIN INSERTION  04/01/2012   Procedure: CLOSED REDUCTION WITH HUMERAL PIN INSERTION;  Surgeon: Schuyler Amor, MD;  Location: [facility] ORS;  Service: Orthopedics;  Laterality: Left;    No Known Allergies  Allergies as of 08/03/2019   No Known Allergies     Medication List       Accurate as of August 03, 2019 12:16 PM. If you have any questions, ask  your nurse or doctor.        STOP taking these medications   acetaminophen 650 MG suppository Commonly known as: TYLENOL Stopped by: Raima Geathers X Leondra Cullin, NP     TAKE these medications   Artificial Tears 1.4 % ophthalmic solution Generic drug: polyvinyl alcohol Place 1 drop into both eyes in the morning and at bedtime.    bisacodyl 10 MG suppository Commonly known as: DULCOLAX Place 10 mg rectally as needed for moderate constipation.   Boost Glucose Control Liqd Take 237 mLs by mouth daily. Give in afternoon (4:00pm -7:00pm).   ketoconazole 2 % shampoo Commonly known as: NIZORAL Apply 1 application topically. Send shampoo with Daughter to beauty shop with resident q week on Wednesday   LORazepam 0.5 MG tablet Commonly known as: ATIVAN Take 0.5 mg by mouth every 2 (two) hours as needed for anxiety.   morphine 20 MG/ML concentrated solution Commonly known as: ROXANOL Give 0.47ml by mouth every four hours as needed for pain/dyspnea   risperiDONE 0.5 MG tablet Commonly known as: RISPERDAL Take 0.25 mg by mouth 2 (two) times daily.      ROS was provided with assistance of staff.  Review of Systems  Unable to perform ROS: Dementia  Constitutional: Negative for unexpected weight change.    Immunization History  Administered Date(s) Administered  . Influenza, High Dose Seasonal PF 02/25/2019  . Influenza-Unspecified 03/02/2013, 02/15/2014, 02/16/2015, 02/22/2016, 02/20/2017, 02/23/2018  . Moderna SARS-COVID-2 Vaccination 05/17/2019  . PPD Test 03/23/2012  . Pneumococcal Conjugate-13 12/17/2016  . Pneumococcal Polysaccharide-23 09/22/2002  . Td 11/17/2002  . Tdap 12/16/2016   Pertinent  Health Maintenance Due  Topic Date Due  . INFLUENZA VACCINE  Completed  . DEXA SCAN  Completed  . PNA vac Low Risk Adult  Completed   Fall Risk  12/24/2017 12/13/2016 02/07/2015 02/22/2014 02/22/2014  Falls in the past year? Yes No No Yes No  Number falls in past yr: 1 - - 1 -  Injury with Fall? Yes - - - -  Risk for fall due to : - - - History of fall(s) -   Functional Status Survey:    Vitals:   08/03/19 0935  BP: 97/63  Pulse: 69  Resp: (!) 22  Temp: 97.8 F (36.6 C)  SpO2: 93%  Weight: 109 lb 14.4 oz (49.9 kg)  Height: 5\' 1"  (1.549 m)   Body mass index is 20.77 kg/m. Physical Exam Vitals  and nursing note reviewed.  Constitutional:      General: She is not in acute distress.    Appearance: She is not ill-appearing.     Comments: Frail, sleepy.   HENT:     Head: Normocephalic and atraumatic.     Nose: Nose normal.     Mouth/Throat:     Mouth: Mucous membranes are dry.  Eyes:     Extraocular Movements: Extraocular movements intact.     Conjunctiva/sclera: Conjunctivae normal.     Pupils: Pupils are equal, round, and reactive to light.  Cardiovascular:     Rate and Rhythm: Normal rate and regular rhythm.     Heart sounds: No murmur.  Pulmonary:     Breath sounds: No wheezing or rales.  Abdominal:     General: Bowel sounds are normal. There is no distension.     Palpations: Abdomen is soft.     Tenderness: There is no abdominal tenderness.  Musculoskeletal:     Cervical back: Normal range of motion and neck supple.  Right lower leg: No edema.     Left lower leg: No edema.  Skin:    General: Skin is warm and dry.     Comments: Left heel pressure ulcer is covered in Allevyn dressing during my examination today.   Neurological:     General: No focal deficit present.     Mental Status: She is alert. Mental status is at baseline.     Motor: No weakness.     Gait: Gait abnormal.     Comments: Blinks her eyes, not conversing.   Psychiatric:     Comments: sleepy     Labs reviewed: Recent Labs    09/01/18 0000  NA 139  K 4.2  BUN 28*  CREATININE 0.7   No results for input(s): AST, ALT, ALKPHOS, BILITOT, PROT, ALBUMIN in the last 8760 hours. Recent Labs    09/01/18 0000 09/07/18 0000  WBC 14.6 9.0  HGB 13.4 12.4  HCT 39 36  PLT 201 240   Lab Results  Component Value Date   TSH 3.45 05/21/2018   Lab Results  Component Value Date   HGBA1C 6.7 07/18/2016   Lab Results  Component Value Date   CHOL 146 01/23/2017   HDL 39 01/23/2017   LDLCALC 76 01/23/2017   TRIG 214 (A) 01/23/2017    Significant Diagnostic Results in last 30 days:  No  results found.  Assessment/Plan Vascular dementia with behavior disturbance (HCC) Her weight is stable, her mood is managed with Risperidone 0.25mg  bid, prn Lorazepam, continue Hospice service for supportive and comfort care.   Osteoarthritis Pain is controlled, continue prn Morphine, activity as tolerated.   Constipation Stable, continue prn Bisacodyl suppository.   Anxiety Her mood is managed, continue Risperidone 0.25mg  bid, prn Lorazepam.      Family/ staff Communication: plan of care reviewed with the patient and charge nurse.   Labs/tests ordered:  none  Time spend 25 minutes.

## 2019-08-03 NOTE — Assessment & Plan Note (Signed)
Stable, continue prn Bisacodyl suppository.

## 2019-08-03 NOTE — Assessment & Plan Note (Signed)
Her mood is managed, continue Risperidone 0.25mg  bid, prn Lorazepam.

## 2019-08-03 NOTE — Assessment & Plan Note (Signed)
Her weight is stable, her mood is managed with Risperidone 0.25mg  bid, prn Lorazepam, continue Hospice service for supportive and comfort care.

## 2019-08-10 ENCOUNTER — Other Ambulatory Visit: Payer: Self-pay | Admitting: *Deleted

## 2019-08-10 MED ORDER — LORAZEPAM 0.5 MG PO TABS: 0.5000 mg | ORAL_TABLET | ORAL | 0 refills | Status: AC | PRN

## 2019-08-10 NOTE — Telephone Encounter (Signed)
Received refill Request from [facility] and sent to Dr. Gupta for approval.  

## 2019-09-11 DEATH — deceased
